# Patient Record
Sex: Male | Born: 1965 | Race: White | Hispanic: No | Smoking: Never smoker
Health system: Southern US, Community
[De-identification: ages and names within clinical notes are randomized; demographics above are authoritative.]

## PROBLEM LIST (undated history)

## (undated) DIAGNOSIS — G4733 Obstructive sleep apnea (adult) (pediatric): Secondary | ICD-10-CM

## (undated) DIAGNOSIS — I240 Acute coronary thrombosis not resulting in myocardial infarction: Secondary | ICD-10-CM

## (undated) DIAGNOSIS — I255 Ischemic cardiomyopathy: Secondary | ICD-10-CM

## (undated) DIAGNOSIS — I509 Heart failure, unspecified: Secondary | ICD-10-CM

## (undated) DIAGNOSIS — F419 Anxiety disorder, unspecified: Secondary | ICD-10-CM

## (undated) DIAGNOSIS — Z9119 Patient's noncompliance with other medical treatment and regimen: Secondary | ICD-10-CM

## (undated) DIAGNOSIS — Z9889 Other specified postprocedural states: Secondary | ICD-10-CM

## (undated) DIAGNOSIS — T8859XA Other complications of anesthesia, initial encounter: Secondary | ICD-10-CM

## (undated) DIAGNOSIS — I2510 Atherosclerotic heart disease of native coronary artery without angina pectoris: Secondary | ICD-10-CM

## (undated) DIAGNOSIS — Z91199 Patient's noncompliance with other medical treatment and regimen due to unspecified reason: Secondary | ICD-10-CM

## (undated) DIAGNOSIS — R519 Headache, unspecified: Secondary | ICD-10-CM

## (undated) DIAGNOSIS — E7800 Pure hypercholesterolemia, unspecified: Secondary | ICD-10-CM

## (undated) DIAGNOSIS — G4730 Sleep apnea, unspecified: Secondary | ICD-10-CM

## (undated) DIAGNOSIS — R112 Nausea with vomiting, unspecified: Secondary | ICD-10-CM

## (undated) DIAGNOSIS — M1990 Unspecified osteoarthritis, unspecified site: Secondary | ICD-10-CM

## (undated) DIAGNOSIS — K760 Fatty (change of) liver, not elsewhere classified: Secondary | ICD-10-CM

## (undated) DIAGNOSIS — I513 Intracardiac thrombosis, not elsewhere classified: Secondary | ICD-10-CM

## (undated) DIAGNOSIS — I10 Essential (primary) hypertension: Secondary | ICD-10-CM

## (undated) DIAGNOSIS — E785 Hyperlipidemia, unspecified: Secondary | ICD-10-CM

## (undated) DIAGNOSIS — R413 Other amnesia: Secondary | ICD-10-CM

## (undated) DIAGNOSIS — N529 Male erectile dysfunction, unspecified: Secondary | ICD-10-CM

## (undated) DIAGNOSIS — R0600 Dyspnea, unspecified: Secondary | ICD-10-CM

## (undated) DIAGNOSIS — F418 Other specified anxiety disorders: Secondary | ICD-10-CM

## (undated) DIAGNOSIS — I219 Acute myocardial infarction, unspecified: Secondary | ICD-10-CM

## (undated) HISTORY — DX: Atherosclerotic heart disease of native coronary artery without angina pectoris: I25.10

## (undated) HISTORY — DX: Other specified anxiety disorders: F41.8

## (undated) HISTORY — DX: Fatty (change of) liver, not elsewhere classified: K76.0

## (undated) HISTORY — DX: Anxiety disorder, unspecified: F41.9

## (undated) HISTORY — DX: Obstructive sleep apnea (adult) (pediatric): G47.33

## (undated) HISTORY — PX: LIVER BIOPSY: SHX301

## (undated) HISTORY — PX: OTHER SURGICAL HISTORY: SHX169

## (undated) HISTORY — DX: Heart failure, unspecified: I50.9

## (undated) HISTORY — DX: Hyperlipidemia, unspecified: E78.5

## (undated) HISTORY — DX: Sleep apnea, unspecified: G47.30

## (undated) HISTORY — PX: CORONARY ANGIOPLASTY: SHX604

## (undated) HISTORY — DX: Pure hypercholesterolemia, unspecified: E78.00

## (undated) HISTORY — PX: NASAL SINUS SURGERY: SHX719

## (undated) HISTORY — PX: CARDIAC CATHETERIZATION: SHX172

---

## 1998-02-26 ENCOUNTER — Ambulatory Visit (HOSPITAL_COMMUNITY): Admission: RE | Admit: 1998-02-26 | Discharge: 1998-02-26 | Payer: Self-pay | Admitting: Family Medicine

## 1998-02-26 ENCOUNTER — Encounter: Payer: Self-pay | Admitting: Family Medicine

## 1998-03-12 ENCOUNTER — Ambulatory Visit (HOSPITAL_COMMUNITY): Admission: RE | Admit: 1998-03-12 | Discharge: 1998-03-12 | Payer: Self-pay | Admitting: Family Medicine

## 1998-03-12 ENCOUNTER — Encounter: Payer: Self-pay | Admitting: Family Medicine

## 1998-03-26 ENCOUNTER — Ambulatory Visit (HOSPITAL_COMMUNITY): Admission: RE | Admit: 1998-03-26 | Discharge: 1998-03-26 | Payer: Self-pay | Admitting: Family Medicine

## 2001-05-20 ENCOUNTER — Emergency Department (HOSPITAL_COMMUNITY): Admission: EM | Admit: 2001-05-20 | Discharge: 2001-05-20 | Payer: Self-pay | Admitting: *Deleted

## 2001-07-13 ENCOUNTER — Ambulatory Visit (HOSPITAL_BASED_OUTPATIENT_CLINIC_OR_DEPARTMENT_OTHER): Admission: RE | Admit: 2001-07-13 | Discharge: 2001-07-13 | Payer: Self-pay | Admitting: *Deleted

## 2003-01-30 ENCOUNTER — Emergency Department (HOSPITAL_COMMUNITY): Admission: AD | Admit: 2003-01-30 | Discharge: 2003-01-30 | Payer: Self-pay | Admitting: Family Medicine

## 2007-01-02 ENCOUNTER — Ambulatory Visit: Payer: Self-pay | Admitting: Cardiology

## 2007-01-05 ENCOUNTER — Encounter: Payer: Self-pay | Admitting: Physician Assistant

## 2008-02-21 ENCOUNTER — Ambulatory Visit: Payer: Self-pay | Admitting: Cardiovascular Disease

## 2008-02-21 ENCOUNTER — Inpatient Hospital Stay (HOSPITAL_COMMUNITY): Admission: EM | Admit: 2008-02-21 | Discharge: 2008-02-25 | Payer: Self-pay | Admitting: Cardiovascular Disease

## 2008-02-22 ENCOUNTER — Encounter: Payer: Self-pay | Admitting: Cardiovascular Disease

## 2008-02-24 ENCOUNTER — Encounter: Payer: Self-pay | Admitting: Cardiology

## 2008-03-01 ENCOUNTER — Encounter: Payer: Self-pay | Admitting: Physician Assistant

## 2008-03-01 ENCOUNTER — Encounter: Payer: Self-pay | Admitting: Cardiology

## 2008-03-02 ENCOUNTER — Ambulatory Visit: Payer: Self-pay | Admitting: Cardiology

## 2008-03-19 ENCOUNTER — Ambulatory Visit: Payer: Self-pay | Admitting: Cardiology

## 2008-04-19 ENCOUNTER — Ambulatory Visit: Payer: Self-pay | Admitting: Cardiology

## 2008-04-23 ENCOUNTER — Ambulatory Visit: Payer: Self-pay | Admitting: Cardiology

## 2008-08-20 DIAGNOSIS — I2109 ST elevation (STEMI) myocardial infarction involving other coronary artery of anterior wall: Secondary | ICD-10-CM | POA: Insufficient documentation

## 2008-09-18 ENCOUNTER — Encounter: Payer: Self-pay | Admitting: Physician Assistant

## 2008-09-19 ENCOUNTER — Encounter (INDEPENDENT_AMBULATORY_CARE_PROVIDER_SITE_OTHER): Payer: Self-pay | Admitting: *Deleted

## 2008-09-27 DIAGNOSIS — E782 Mixed hyperlipidemia: Secondary | ICD-10-CM | POA: Insufficient documentation

## 2008-09-27 DIAGNOSIS — I10 Essential (primary) hypertension: Secondary | ICD-10-CM | POA: Insufficient documentation

## 2008-09-27 DIAGNOSIS — I2510 Atherosclerotic heart disease of native coronary artery without angina pectoris: Secondary | ICD-10-CM | POA: Insufficient documentation

## 2008-11-08 ENCOUNTER — Telehealth (INDEPENDENT_AMBULATORY_CARE_PROVIDER_SITE_OTHER): Payer: Self-pay | Admitting: *Deleted

## 2008-11-08 ENCOUNTER — Ambulatory Visit: Payer: Self-pay | Admitting: Cardiology

## 2008-11-16 ENCOUNTER — Encounter (INDEPENDENT_AMBULATORY_CARE_PROVIDER_SITE_OTHER): Payer: Self-pay | Admitting: *Deleted

## 2008-11-19 ENCOUNTER — Encounter: Payer: Self-pay | Admitting: Cardiology

## 2009-03-06 ENCOUNTER — Encounter: Payer: Self-pay | Admitting: Cardiology

## 2009-03-08 ENCOUNTER — Ambulatory Visit: Payer: Self-pay | Admitting: Cardiology

## 2009-03-26 ENCOUNTER — Encounter: Payer: Self-pay | Admitting: Physician Assistant

## 2009-03-27 ENCOUNTER — Encounter (INDEPENDENT_AMBULATORY_CARE_PROVIDER_SITE_OTHER): Payer: Self-pay | Admitting: *Deleted

## 2009-04-19 ENCOUNTER — Encounter (INDEPENDENT_AMBULATORY_CARE_PROVIDER_SITE_OTHER): Payer: Self-pay | Admitting: *Deleted

## 2009-05-03 ENCOUNTER — Encounter: Payer: Self-pay | Admitting: Cardiology

## 2009-06-07 ENCOUNTER — Encounter: Payer: Self-pay | Admitting: Physician Assistant

## 2010-02-18 NOTE — Medication Information (Signed)
Summary: RX Folder/  PATIENT ASSISTANCE  RX Folder/  PATIENT ASSISTANCE   Imported By: Dorise Hiss 03/14/2009 08:26:25  _____________________________________________________________________  External Attachment:    Type:   Image     Comment:   External Document

## 2010-02-18 NOTE — Medication Information (Signed)
Summary: RX Folder/ APPLICATION PRESCRIPTION ASSISTANCE  RX Folder/ APPLICATION PRESCRIPTION ASSISTANCE   Imported By: Dorise Hiss 06/12/2009 14:12:31  _____________________________________________________________________  External Attachment:    Type:   Image     Comment:   External Document

## 2010-02-18 NOTE — Medication Information (Signed)
Summary: RX Folder/ MAILED APPLICATION PLAVIX  RX Folder/ MAILED APPLICATION PLAVIX   Imported By: Dorise Hiss 04/01/2009 10:14:23  _____________________________________________________________________  External Attachment:    Type:   Image     Comment:   External Document

## 2010-02-18 NOTE — Letter (Signed)
Summary: Generic Engineer, agricultural at [facility] [address]. [address]   [place], [place] [address]   Phone: [phone]  Fax: [phone]        March 27, 2009 MRN: [id number]    Leilan Palo [address], [place]  [address]    Dear Mr. BORGE,   We order lab work to follow up on your cholesterol and liver function levels following your February 18th office visit. It does not appear this lab work has been done yet.  Please take the enclosed order to the [facility] at your earliest convenience to have this done. Do not eat or drink after midnight.  If you primary MD has done this lab work for you or you do not plan to have it done, please contact our office so that we can properly document this in your chart.      Sincerely,  Cyril Loosen, RN, BSN  This letter has been electronically signed by your physician.

## 2010-02-18 NOTE — Assessment & Plan Note (Signed)
Summary: 3 MO FU REMINDER-SRS   Visit Type:  Follow-up Primary Provider:  Dr. Mindi Curling   History of Present Illness: 45 year old male presents for follow-up. Mr. Lambson denies any anginal chest pain. He has NYHA class 2-3 dyspnea on exertion, and states that he has been trying to do some walking at the gym. He is not reporting any nitroglycerin use. He indicates compliance with his medications, although this has been a problem for him over time.  Labs from 19 November 2008 show AST 21, ALT 41, LDL 138, HDL, 32, total cholesterol 244, triglycerides 371. He was not consistently on Simvastatin during this time.  Today we talked about adjustments in medical therapy and a followup lipid profile. We also discussed continuing the strategy of regular exercise and goal for more weight loss.  Preventive Screening-Counseling & Management  Alcohol-Tobacco     Smoking Status: never  Current Medications (verified): 1)  Plavix 75 Mg Tabs (Clopidogrel Bisulfate) .... Take One Tablet By Mouth Daily 2)  Metformin Hcl 500 Mg Tabs (Metformin Hcl) .... Take 1 Tablet By Mouth Two Times A Day 3)  Lantus 100 Unit/ml Soln (Insulin Glargine) .... 25u Subcutaneously At Bedtime 4)  Coreg 12.5 Mg Tabs (Carvedilol) .... Take 1 Tablet By Mouth Two Times A Day (Place On File) 5)  Simvastatin 80 Mg Tabs (Simvastatin) .... Take One Tablet By Mouth Daily At Bedtime 6)  Glipizide 10 Mg Tabs (Glipizide) .... Take 1 Tablet By Mouth Once A Day 7)  Niaspan 1000 Mg Cr-Tabs (Niacin (Antihyperlipidemic)) .... Take 1 Tablet By Mouth Once A Day 8)  Nitroglycerin 0.4 Mg Subl (Nitroglycerin) .... One Tablet Under Tongue Every 5 Minutes As Needed For Chest Pain---May Repeat Times Three 9)  Bupropion Hcl 75 Mg Tabs (Bupropion Hcl) .... Take 1 Tablet By Mouth Once A Day 10)  Mirtazapine 30 Mg Tabs (Mirtazapine) .... Take 1 Tablet By Mouth Once A Day 11)  Trazodone Hcl 150 Mg Tabs (Trazodone Hcl) .... Take 1 Tablet By Mouth Once A Day  At Bedtime  Allergies (verified): No Known Drug Allergies  Comments:  Nurse/Medical Assistant: The patient is currently on medications but does not know the name or dosage at this time. Instructed to contact our office with details. Will update medication list at that time.  Past History:  Past Medical History: Last updated: 03/07/2009 CAD - DES LAD 2/10, LVEF 50% Diabetes Type 2 Hyperlipidemia Hypertension Myocardial Infarction OSA History of elevated liver enzymes - fatty liver  Social History: Last updated: 03/07/2009 Divorced  Tobacco Use - No Alcohol Use - no   Review of Systems  The patient denies anorexia, fever, chest pain, syncope, prolonged cough, headaches, hemoptysis, melena, and hematochezia.         No orthopnea or PND. No palpitations. Otherwise reviewed and negative.  Vital Signs:  Patient profile:   45 year old male Height:      70 inches Weight:      246 pounds Pulse rate:   102 / minute BP sitting:   132 / 89  (left arm) Cuff size:   large  Vitals Entered By: Carlye Grippe (March 08, 2009 1:47 PM)  Physical Exam  Additional Exam:  Obese male in no acute distress. HEENT: Conjunctiva and lids normal, oropharynx clear with moist mucosa. Neck: Supple, no carotid bruits or thyromegaly. Lungs: Clear to auscultation, nonlabored breathing. Diminished overall. Cardiac: Regular rate and rhythm, no S3 gallop, indistinct PMI. Abdomen: Soft, nontender, bowel sounds present. Extremities: No  pitting edema, distal pulses one plus. Skin: Warm and dry. Musculoskeletal: No kyphosis. Neuropsychiatric: Alert and oriented x3, affect appropriate.   EKG  Procedure date:  03/08/2009  Findings:      Normal sinus rhythm at 97 beats per minute with decreased R-wave progression and nonspecific T-wave changes.  Impression & Recommendations:  Problem # 1:  CORONARY ATHEROSCLEROSIS NATIVE CORONARY ARTERY (ICD-414.01)  Symptomatically stable without  progressive angina or breathlessness. Patient reports compliance with his medications, and heart rate remains suboptimally controlled. In light of his cardiovascular disease and mild left ventricular dysfunction, we will continue to uptitrate carvedilol to 12.5 mg p.o. b.i.d. The addition of an ACE inhibitor will be considered next, presuming he is able to stay compliant with his current medications, and guided by blood pressure control. I plan to see him back over the next 4 months, preceded by a 2-D echocardiogram.  His updated medication list for this problem includes:    Plavix 75 Mg Tabs (Clopidogrel bisulfate) .Marland Kitchen... Take one tablet by mouth daily    Coreg 12.5 Mg Tabs (Carvedilol) .Marland Kitchen... Take 1 tablet by mouth two times a day (place on file)    Nitroglycerin 0.4 Mg Subl (Nitroglycerin) ..... One tablet under tongue every 5 minutes as needed for chest pain---may repeat times three  Orders: EKG w/ Interpretation (93000)  Problem # 2:  ESSENTIAL HYPERTENSION, BENIGN (ICD-401.1)  BP mildly increased today. Will need to keep an eye on this, and adjust medications as appropriate.  His updated medication list for this problem includes:    Coreg 12.5 Mg Tabs (Carvedilol) .Marland Kitchen... Take 1 tablet by mouth two times a day (place on file)  Problem # 3:  MIXED HYPERLIPIDEMIA (ICD-272.2)  Will followup fasting lipid profile and liver function tests.  His updated medication list for this problem includes:    Simvastatin 80 Mg Tabs (Simvastatin) .Marland Kitchen... Take one tablet by mouth daily at bedtime    Niaspan 1000 Mg Cr-tabs (Niacin (antihyperlipidemic)) .Marland Kitchen... Take 1 tablet by mouth once a day  Orders: T-Lipid Profile ([id number]) T-Hepatic Function [phone])  Patient Instructions: 1)  Increase Coreg to 12.5mg  two times a day  2)  Labs:  FLP/LFT 3)  2D-Echo in 4 months before next visit 4)  Follow up in  4 months. Prescriptions: COREG 12.5 MG TABS (CARVEDILOL) Take 1 tablet by mouth two times a  day (place on file)  #60 x 6   Entered by:   Hoover Brunette, LPN   Authorized by:   Loreli Slot, MD, [facility]   Signed by:   Hoover Brunette, LPN on 29/56/2130   Method used:   Electronically to        [facility]  [address] (retail)       [address]       [place], [place]  [address]       Ph: [phone]       Fax: [phone]   RxID:   [id number]

## 2010-02-18 NOTE — Letter (Signed)
Summary: [name] [name], agricultural at [facility] [address]   [place], [place] [address]   Phone: [phone]  Fax: [phone]        April 19, 2009 MRN: [id number]    [name] [name] [address], [place]  [address]    Dear [name] [name],  We order lab work to follow up on your cholesterol and liver function levels following your February 18th office visit. We mailed you a letter on March 9th reminding you of these labs. It does not appear this lab work has been done yet.  Please take the enclosed order to the [facility] at your earliest convenience to have this done. Do not eat or drink after midnight.  If you primary MD has done this lab work for you or you do not plan to have it done, please contact our office so that we can properly document this in your chart.        Sincerely,  [name] Loosen, RN, BSN  This letter has been electronically signed by your physician.  Appended Document: [name] Letter Letter will be mailed certified.  Appended Document: [name] Letter Response card was signed on 4/13.

## 2010-03-20 ENCOUNTER — Emergency Department (HOSPITAL_COMMUNITY)
Admission: EM | Admit: 2010-03-20 | Discharge: 2010-03-20 | Disposition: A | Discharge status: Home / Self Care | Payer: Medicare Other | Attending: Emergency Medicine | Admitting: Emergency Medicine

## 2010-03-20 ENCOUNTER — Encounter: Payer: Self-pay | Admitting: Cardiovascular Disease

## 2010-03-20 ENCOUNTER — Emergency Department (HOSPITAL_COMMUNITY): Payer: Medicare Other

## 2010-03-20 DIAGNOSIS — I2510 Atherosclerotic heart disease of native coronary artery without angina pectoris: Secondary | ICD-10-CM | POA: Insufficient documentation

## 2010-03-20 DIAGNOSIS — E119 Type 2 diabetes mellitus without complications: Secondary | ICD-10-CM | POA: Insufficient documentation

## 2010-03-20 DIAGNOSIS — R079 Chest pain, unspecified: Secondary | ICD-10-CM | POA: Insufficient documentation

## 2010-03-20 DIAGNOSIS — I10 Essential (primary) hypertension: Secondary | ICD-10-CM | POA: Insufficient documentation

## 2010-03-20 LAB — COMPREHENSIVE METABOLIC PANEL
ALT: 37 U/L (ref 0–53)
AST: 30 U/L (ref 0–37)
CO2: 27 mEq/L (ref 19–32)
Calcium: 9 mg/dL (ref 8.4–10.5)
Creatinine, Ser: 1 mg/dL (ref 0.4–1.5)
GFR calc Af Amer: >60 mL/min (ref >60)
GFR calc non Af Amer: >60 mL/min (ref >60)
Glucose, Bld: 205 mg/dL — ABNORMAL HIGH (ref 70–99)
Sodium: 138 mEq/L (ref 135–145)
Total Protein: 6.8 g/dL (ref 6.0–8.3)

## 2010-03-20 LAB — TROPONIN I: Troponin I: <0.01 ng/mL (ref 0.00–0.06)

## 2010-03-20 LAB — CBC
HCT: 44.1 % (ref 39.0–52.0)
Hemoglobin: 15.4 g/dL (ref 13.0–17.0)
MCV: 83.7 fL (ref 78.0–100.0)
RBC: 5.27 MIL/uL (ref 4.22–5.81)
WBC: 6.8 10*3/uL (ref 4.0–10.5)

## 2010-03-20 LAB — DIFFERENTIAL
Basophils Absolute: 0 10*3/uL (ref 0.0–0.1)
Lymphocytes Relative: 28 % (ref 12–46)
Lymphs Abs: 1.9 10*3/uL (ref 0.7–4.0)
Neutro Abs: 4.1 10*3/uL (ref 1.7–7.7)
Neutrophils Relative %: 60 % (ref 43–77)

## 2010-03-20 LAB — CK TOTAL AND CKMB (NOT AT ARMC)
Relative Index: 2.2 (ref 0.0–2.5)
Total CK: 231 U/L (ref 7–232)

## 2010-03-20 LAB — POCT CARDIAC MARKERS
CKMB, poc: 2.2 ng/mL (ref 1.0–8.0)
CKMB, poc: 1.6 ng/mL (ref 1.0–8.0)
Myoglobin, poc: 77.1 ng/mL (ref 12–200)
Myoglobin, poc: 64.5 ng/mL (ref 12–200)
Troponin i, poc: <0.05 ng/mL (ref 0.00–0.09)

## 2010-03-20 LAB — RAPID URINE DRUG SCREEN, HOSP PERFORMED: Barbiturates: NOT DETECTED

## 2010-03-20 LAB — GLUCOSE, CAPILLARY: Glucose-Capillary: 205 mg/dL — ABNORMAL HIGH (ref 70–99)

## 2010-03-24 ENCOUNTER — Encounter: Payer: Self-pay | Admitting: Cardiovascular Disease

## 2010-03-25 ENCOUNTER — Telehealth (INDEPENDENT_AMBULATORY_CARE_PROVIDER_SITE_OTHER): Payer: Self-pay | Admitting: *Deleted

## 2010-03-26 ENCOUNTER — Ambulatory Visit (HOSPITAL_COMMUNITY): Payer: Medicare Other | Attending: Cardiovascular Disease

## 2010-03-26 ENCOUNTER — Encounter: Payer: Self-pay | Admitting: Cardiology

## 2010-03-26 ENCOUNTER — Other Ambulatory Visit: Payer: Self-pay | Admitting: Cardiovascular Disease

## 2010-03-26 ENCOUNTER — Encounter: Payer: Self-pay | Admitting: Cardiovascular Disease

## 2010-03-26 ENCOUNTER — Other Ambulatory Visit (INDEPENDENT_AMBULATORY_CARE_PROVIDER_SITE_OTHER): Payer: Medicare Other

## 2010-03-26 DIAGNOSIS — I5031 Acute diastolic (congestive) heart failure: Secondary | ICD-10-CM

## 2010-03-26 DIAGNOSIS — R0609 Other forms of dyspnea: Secondary | ICD-10-CM

## 2010-03-26 DIAGNOSIS — R0789 Other chest pain: Secondary | ICD-10-CM

## 2010-03-26 DIAGNOSIS — R079 Chest pain, unspecified: Secondary | ICD-10-CM | POA: Insufficient documentation

## 2010-03-26 DIAGNOSIS — R0602 Shortness of breath: Secondary | ICD-10-CM

## 2010-03-26 LAB — BASIC METABOLIC PANEL
Calcium: 9.4 mg/dL (ref 8.4–10.5)
GFR: 108.33 mL/min (ref >60.00)
Potassium: 4.2 mEq/L (ref 3.5–5.1)
Sodium: 138 mEq/L (ref 135–145)

## 2010-03-27 ENCOUNTER — Telehealth: Payer: Self-pay | Admitting: Cardiovascular Disease

## 2010-03-31 ENCOUNTER — Emergency Department (HOSPITAL_COMMUNITY)
Admission: EM | Admit: 2010-03-31 | Discharge: 2010-03-31 | Disposition: A | Discharge status: Home / Self Care | Payer: Medicare Other | Attending: Emergency Medicine | Admitting: Emergency Medicine

## 2010-03-31 DIAGNOSIS — L02818 Cutaneous abscess of other sites: Secondary | ICD-10-CM | POA: Insufficient documentation

## 2010-04-01 NOTE — Progress Notes (Signed)
Summary: Nuclear Pre-Procedure  Phone Note Outgoing Call   Call placed by: Stanton Kidney, EMT-P,  March 25, 2010 11:41 AM Summary of Call: Unable to leave message with information on Myoview Information Sheet (see scanned document for details); No accurate phone numbers available. Stanton Kidney, EMT-P  March 25, 2010 11:42 AM      Nuclear Med Background Indications for Stress Test: Evaluation for Ischemia, PTCA Patency, Post Hospital   History: Echo, Heart Catheterization, Myocardial Infarction, Stents   Symptoms: Chest Pain, DOE    Nuclear Pre-Procedure Cardiac Risk Factors: Hypertension, IDDM Type 2, Lipids, Obesity Height (in): 70

## 2010-04-01 NOTE — Progress Notes (Signed)
Summary: pt wants results of labs  Phone Note Call from Patient   Caller: Patient [phone] Reason for Call: Talk to Nurse, Lab or Test Results Summary of Call: pt wants lab results Initial call taken by: Glynda Jaeger,  March 27, 2010 1:12 PM  Follow-up for Phone Call        Told patient I would call him once Dr. Clifton James reviews his stress test. Ellender Hose RN  March 27, 2010 3:22 PM  Follow-up by: Whitney Maeola Sarah RN,  March 27, 2010 3:22 PM  Additional Follow-up for Phone Call Additional follow up Details #1::        Alphonzo [place], This is an [place] patient. I am not sure who ordered the labs and stress test. cdm Additional Follow-up by: Verne Carrow, MD,  March 27, 2010 4:28 PM

## 2010-04-01 NOTE — Assessment & Plan Note (Addendum)
Summary: Cardiology Nuclear testing  Nuclear Med Background Indications for Stress Test: Stent Patency   History: Echo, Heart Catheterization, Myocardial Infarction, Stents  History Comments: 2010-Ant. MI treated with Stent-LAD. EF= 45%. Residual N/O CAD 2010- Echo- EF= 35-40%  Symptoms: Chest Pain, Chest Pain with Exertion, DOE, Fatigue, SOB    Nuclear Pre-Procedure Cardiac Risk Factors: Hypertension, IDDM Type 2, Lipids Caffeine/Decaff Intake: None NPO After: 7:00 PM Lungs: clear IV 0.9% NS with Angio Cath: 18g     IV Site: R Antecubital IV Started by: Stanton Kidney, EMT-P Chest Size (in) 46     Height (in): 70 Weight (lb): 237 BMI: 34.13  Nuclear Med Study 1 or 2 day study:  1 day     Stress Test Type:  Treadmill/Lexiscan Reading MD:  Willa Rough, MD     Referring MD:  C.McAlhany Resting Radionuclide:  Technetium 50m Tetrofosmin     Resting Radionuclide Dose:  10.5 mCi  Stress Radionuclide:  Technetium 18m Tetrofosmin     Stress Radionuclide Dose:  33.0 mCi   Stress Protocol Exercise Time (min):  11:11 min     Max HR:  139 bpm     Predicted Max HR:  176 bpm  Max Systolic BP: 159 mm Hg     Percent Max HR:  78.98 %     METS: 11.0 Rate Pressure Product:  91478  Lexiscan: 0.4 mg   Stress Test Technologist:  Frederick Peers, EMT-P     Nuclear Technologist:  Domenic Polite, CNMT  Rest Procedure  Myocardial perfusion imaging was performed at rest 45 minutes following the intravenous administration of Technetium 61m Tetrofosmin.  Stress Procedure  The patient received IV Lexiscan 0.4 mg over 15-seconds with concurrent low level exercise and then Technetium 30m Tetrofosmin was injected at 30-seconds while the patient continued walking one more minute.  There were no significant changes with Lexiscan.  Quantitative spect images were obtained after a 45 minute delay.  QPS Raw Data Images:  Patient motion noted; appropriate software correction applied. Stress Images:  Severe  decrease in activity in the anterior wall, anterior septum and the apex Rest Images:  Similar to stress Subtraction (SDS):  Minimal ischemia Transient Ischemic Dilatation:  1.01  (Normal <1.22)  Lung/Heart Ratio:  0.35  (Normal <0.45)  Quantitative Gated Spect Images QGS EDV:  191 ml QGS ESV:  128 ml QGS EF:  33 % QGS cine images:  Akinesis of the septum, anterior wall and the apex  Findings Abnormal      Overall Impression  Exercise Capacity: Good exercise capacity. BP Response: Normal blood pressure response. Clinical Symptoms: SOB ECG Impression: No significant ST segment change suggestive of ischemia. Overall Impression Comments: With stress on treadmill the patient was not reaching desired heart rate, so Lexiscan given. The nuclear images reveal old large scar affecting the anterior wall, anterior septum, and the apex. There is mild peri-infarct ischemia.  Appended Document: Cardiology Nuclear testing This is a patient of Dr. Ival Bible. He has not been seen in the [place] office. I am not sure who ordered the stress test. No visits in [place] office since February 2011. I will forward to Dr. Diona Browner for review.   CDM  Appended Document: Cardiology Nuclear testing I have not seen Mr. Burnett since February 2011 in the [place] office. As noted with the recent lab work, I am not certain who ordered these studies. We will arrange an office visit to discuss them.  Appended Document: Cardiology Nuclear testing Pt was seen in  [place] d/t chest pain on March 1st. He states tests were ordered following this test. Pt states he has moved to [facility] and wishes to follow up there in the future. Pt has pending appt w/Dr. Clifton James on 3/22 according to Epic.

## 2010-04-09 ENCOUNTER — Encounter: Payer: Self-pay | Admitting: Physician Assistant

## 2010-04-10 ENCOUNTER — Encounter: Payer: Self-pay | Admitting: Cardiovascular Disease

## 2010-04-16 ENCOUNTER — Encounter: Payer: Self-pay | Admitting: Physician Assistant

## 2010-04-17 NOTE — Consult Note (Signed)
[name] [name], [name] [name]                  ACCOUNT NO.:  [id number]  MEDICAL RECORD NO.:  [id number]           PATIENT TYPE:  E  LOCATION:  [facility]                         FACILITY:  [facility]  PHYSICIAN:  Bevelyn Buckles. Terin Cragle, MDDATE OF BIRTH:  1965/04/05  DATE OF CONSULTATION:  03/20/2100 DATE OF DISCHARGE:  03/20/2010                                CONSULTATION   PRIMARY CARDIOLOGIST:  Previously Dr. Diona Browner in [place].  The patient will follow up with Dr. Clifton James in [place].  Primary care previously [facility].  PATIENT PROFILE:  A 45 year old male with prior history of anterior MI, status post drug-eluting stent to the LAD in 2010 who presents with sharp shooting chest pain.  PROBLEMS: 1. Chest pain/coronary artery disease.     a.     February 21, 2008, anterior ST segment elevation MI with      catheterization revealing a totally occluded proximal LAD and      otherwise nonobstructive disease.  The LAD was stented with a 3.0      x 50 mm Endeavor drug-eluting stent. 2. Ischemic cardiomyopathy.     a.     February 22, 2008, 2D echocardiogram, EF 35-40% was septal      and apical as well as distal anterior akinesis. 3. Type 2 diabetes mellitus now diet controlled. 4. Hyperlipidemia. 5. Noncompliance. 6. Insomnia. 7. Sleep apnea. 8. Hypertension. 9. History of fatty liver with a history of elevated LFTs.  ALLERGIES:  No known drug allergies.  HISTORY OF PRESENT ILLNESS:  A 45 year old male with prior history of CAD status post anterior MI in February 2010 with drug-eluting stent placement to the proximal LAD.  Since his MI, the patient has had occasional chest pain and dyspnea on exertion, but only with higher levels of activity.  This has been stable over the past 2 years.  He has been walking 45-60 minutes daily without symptoms or limitations.  He was last seen by Dr. Diona Browner in [place] in February 2011.  At that time, the patient's Coreg was titrated with  plan for followup echo and subsequent addition of ACE inhibitor given history of ischemic cardiomyopathy.  Unfortunately, the patient has never followed up.  More so, the patient came off of all his medications in the past 6-12 months, as he lost contact with doctors at [facility] and subsequently, moved to [place] about a month ago.  Last night about 8:00 p.m., the patient developed intermittent sharp and shooting chest pain lasting a second or so and resolving spontaneously. Symptoms recurred few times in hour.  There was no dyspnea.  Symptoms were dissimilar to prior angina, which was more typical in nature with an elephant on his chest and dyspnea.  After going to bed, the patient continued to have intermittent sharp shooting chest pain especially when he rolled from one side to the other or took a deep breath.  At about 5:00 a.m., he noted some mild nausea and because of his constellation of symptoms, decided to come in to the ED where his nausea was promptly relieved with Zofran.  He has  had no recurrence in chest pain and ECG shows no acute changes.  His enzymes are negative.  The patient wishes to be discharged from the ED.  HOME MEDICATIONS:  The patient is currently not on any medications, but as of February 2011, the patient was supposed to be on: 1. Plavix 75 mg daily. 2. Metformin 500 mg b.i.d. 3. Lantus 25 units at bedtime. 4. Coreg 12.5 mg b.i.d. 5. Simvastatin 80 mg at bedtime. 6. Glipizide 10 mg daily. 7. Niaspan 1000 mg daily. 8. Nitroglycerin p.r.n. 9. Bupropion 75 mg daily. 10.Remeron 30 mg daily. 11.Trazodone 150 mg at bedtime.  FAMILY HISTORY:  Mother has a history of cancer.  Father has a history of coronary artery disease, CABG, and COPD.  SOCIAL HISTORY:  The patient lives in [place] with his wife.  He just moved here about a month ago for [place].  He does not work and is on disability.  Denies tobacco, alcohol, or drug  use.  He walks 45-60 minutes daily.  REVIEW OF SYSTEMS:  Positive for dyspnea with high levels of exertion as well as mild chest pain with a similar levels of exertion.  He did have a sharp shooting chest pain different from his prior angina overnight. Otherwise, all systems reviewed negative.  He is full code.  PHYSICAL EXAMINATION:  VITAL SIGNS: Temperature 98.5, heart rate 86, respirations 16, blood pressure 116/77, pulse ox 96% on room air. GENERAL:  Pleasant white male, in no acute distress, awake, alert, and oriented x3.  He has a normal affect. HEENT:  Normal.  Nares grossly intact, nonfocal. SKIN:  Warm, dry without lesions or masses. NECK:  Supple without bruits or JVD. LUNGS:  Respirations are unlabored.  Clear to auscultation. CARDIAC:  Regular S1 and S2.  No S3, S4, murmurs. ABDOMEN:  Round, soft, nontender, nondistended.  Bowel sounds present x4. EXTREMITIES:  Warm, dry, and pink.  No clubbing, cyanosis, or edema. Dorsalis pedis, posterior tibial pulses 2+ and equal bilaterally.  Chest x-ray shows no acute disease.  EKG shows sinus rhythm, rate 86, normal axis, prior anterior infarct.  Hemoglobin 15.4, hematocrit 44.1, WBC 6.8, platelets 246.  Sodium 138, potassium 4.6, chloride 106, CO2 of 27, BUN 10, creatinine 1.0, glucose 205, total bilirubin 0.5, alkaline phosphatase 56, AST 30, ALT 37, total protein 6.8, albumin 3.8, CK 231, MB 501, troponin I less than 0.01, calcium 9.0.  ASSESSMENT AND PLAN: 1. Chest pain without objective evidence of ischemia/coronary artery     disease.  The patient has prior history of coronary artery disease     status post anterior myocardial infarction in 2010 with drug-     eluting stent placement to the proximal LAD.  He has been off of     his Plavix for at least 6 months.  Given that he is 2 years post     drug-eluting stent, we will not reinitiate Plavix at this time.     His chest pain currently is fairly atypical without  objective     evidence of ischemia.  We have arranged for the patient to have an     outpatient exercise Myoview in our office on March 26, 2010, at 8:00     a.m.  Follow up with Dr. Clifton James who performed his stent     procedure, on April 10, 2010, at 2:15 p.m.  We have reiterated to     the patient the importance of medication compliance especially in     light of prior  history of coronary artery disease, myocardial     infarction, and ischemic cardiomyopathy.  We have provided him with     prescriptions for Coreg 3.125 mg b.i.d., lisinopril 5 mg daily,     lovastatin 4 mg at bedtime, nitroglycerin 0.4 mg sublingual on     p.r.n. chest pain, and aspirin 81 mg daily.  As we are reinitiating     statin therapy, the patient will require follow up with LFTs in     approximately 6-8 weeks.  He will also need a followup basic     metabolic panel, which he can have when he comes back for a stress     test on March 26, 2010. 2. Hypertension.  Reinitiating beta blocker and ACE inhibitor.  We     will follow as an outpatient. 3. Hyperlipidemia.  Resume statin therapy as above.  Follow up lipids     and LFTs in 6-8 weeks. 4. History of noncompliance, stressed the importance of compliance for     secondary prevention.     Nicolasa Ducking, ANP   ______________________________ Bevelyn Buckles. Keva Darty, MD    CB/MEDQ  D:  03/20/2010  T:  03/21/2010  Job:  [id number]  Electronically Signed by Nicolasa Ducking ANP on 03/25/2010 04:20:54 PM Electronically Signed by Arvilla Meres MD on 04/17/2010 06:44:22 PM

## 2010-04-24 ENCOUNTER — Ambulatory Visit (INDEPENDENT_AMBULATORY_CARE_PROVIDER_SITE_OTHER): Payer: Medicare Other | Admitting: Cardiovascular Disease

## 2010-04-24 ENCOUNTER — Encounter: Payer: Self-pay | Admitting: Cardiovascular Disease

## 2010-04-24 VITALS — BP 128/88 | HR 92 | Ht 70.0 in | Wt 242.0 lb

## 2010-04-24 DIAGNOSIS — I2589 Other forms of chronic ischemic heart disease: Secondary | ICD-10-CM

## 2010-04-24 DIAGNOSIS — I255 Ischemic cardiomyopathy: Secondary | ICD-10-CM

## 2010-04-24 DIAGNOSIS — I2510 Atherosclerotic heart disease of native coronary artery without angina pectoris: Secondary | ICD-10-CM

## 2010-04-24 MED ORDER — ASPIRIN EC 81 MG PO TBEC: 81.0000 mg | DELAYED_RELEASE_TABLET | Freq: Every day | ORAL | Status: DC

## 2010-04-24 MED ORDER — SIMVASTATIN 40 MG PO TABS: 40.0000 mg | ORAL_TABLET | Freq: Every day | ORAL | Status: DC

## 2010-04-24 MED ORDER — LISINOPRIL 5 MG PO TABS: 5.0000 mg | ORAL_TABLET | Freq: Every day | ORAL | Status: DC

## 2010-04-24 MED ORDER — CARVEDILOL 6.25 MG PO TABS: 6.2500 mg | ORAL_TABLET | Freq: Two times a day (BID) | ORAL | Status: DC

## 2010-04-24 NOTE — Patient Instructions (Signed)
Your physician recommends that you schedule a follow-up appointment in: 3 months with Dr. Clifton James. Your physician has recommended you make the following change in your medication: START SIMVASTATIN 40mg  daily, LISINOPRIL 5 mg daily, ASPIRIN 81mg  daily, and COREG 6.25mg  twice daily.\ Your physician has requested that you have an echocardiogram. Echocardiography is a painless test that uses sound waves to create images of your heart. It provides your doctor with information about the size and shape of your heart and how well your heart's chambers and valves are working. This procedure takes approximately one hour. There are no restrictions for this procedure. To be done in 3 months

## 2010-04-24 NOTE — Assessment & Plan Note (Addendum)
S/p anterior STEMI February 2010 with occluded LAD, s/p DES. He has stopped all of his cardiac meds. I will restart Coreg 6.25 mg po BID, ASA 81 mg po Qdaily, Lisinopril 5 mg po QDaily, Simvastin  40 mg po QHS. He is encouraged to see a primary care doctor for his diabetic management. Stress test with mild peri-infarct ischemia. He is having no chest pain. Will get him back on his meds, repeat echo in 3 months and see him back. He may need consideration for an ICD.

## 2010-04-24 NOTE — Progress Notes (Signed)
History of Present Illness:44 yo WM with history of CAD with anterior STEMI February 2010 with drug eluting stent placed in the proximal LAD, DM, hyperlipidemia here today for cardiac follow up. He has been followed over the last two years in our [place] office by Dr. Diona Browner but wished to be seen in [place].His last visit in the [place] office was in February 2011, he missed his 4 month follow up and his follow up echo.    He was admitted to [facility] March 20, 2010 with chest pain. He ruled out for an MI with serial cardiac enzymes. Outpatient myoview was arranged. He could not get his heart rate up with exercise so Lexiscan was used. This showed scar in the anterior wall with mild ischemia and EF of 33%. His post STEMI  EF leaving the hospital was 35% by echo in February 2010. He has not been taking any of his medications. He has stopped all of the cardiac meds and diabetic meds. He has not seen a primary care physician. He rarely checks his blood sugars but last check several months ago his blood sugar was over 200. He has not been following a good diet. He did rake leaves last week and had no chest pain or dyspnea. The chest pain that he had before his hospital admission last month was not similar to the pain he had before his MI.     Past Medical History  Diagnosis Date  . Coronary artery disease     DES LAD 2/10, LVEF 50%  . Diabetes mellitus     TYPE II  . Hyperlipidemia   . Heart attack   . OSA (obstructive sleep apnea)   . Fatty liver     H/O ELEVATED LIVER ENZYMES    No past surgical history on file.  Current Outpatient Prescriptions  Medication Sig Dispense Refill  . buPROPion (WELLBUTRIN) 75 MG tablet Take 75 mg by mouth daily. 1 TABLET ONCE DAILY       . carvedilol (COREG) 12.5 MG tablet Take 12.5 mg by mouth 2 (two) times daily with a meal.        . clopidogrel (PLAVIX) 75 MG tablet Take 75 mg by mouth daily.        Marland Kitchen glipiZIDE (GLUCOTROL) 10 MG tablet Take 10 mg by  mouth daily. 1 TABLET ONCE DAILY       . insulin glargine (LANTUS) 100 UNIT/ML injection Inject 25 Units into the skin at bedtime.        . metFORMIN (GLUCOPHAGE) 500 MG tablet Take 500 mg by mouth 2 (two) times daily with a meal.        . mirtazapine (REMERON) 30 MG tablet Take 30 mg by mouth at bedtime.        . niacin (NIASPAN) 1000 MG CR tablet Take 1,000 mg by mouth at bedtime.        . nitroGLYCERIN (NITROSTAT) 0.4 MG SL tablet Place 0.4 mg under the tongue every 5 (five) minutes as needed.        . simvastatin (ZOCOR) 80 MG tablet Take 80 mg by mouth at bedtime.        . traZODone (DESYREL) 150 MG tablet Take 150 mg by mouth at bedtime.          Not on File  History   Social History  . Marital Status: Married    Spouse Name: N/A    Number of Children: N/A  . Years of Education: N/A  Occupational History  . Not on file.   Social History Main Topics  . Smoking status: Never Smoker   . Smokeless tobacco: Not on file  . Alcohol Use: No  . Drug Use: Not on file  . Sexually Active: Not on file   Other Topics Concern  . Not on file   Social History Narrative  . No narrative on file    Family History  Problem Relation Age of Onset  . Coronary artery disease      UNKNOWN    Review of Systems:  No chest pain, SOB, palpitations, dizziness,  near syncope or syncope.  No PND, orthopnea, or Lower extremity edema.   BP 128/88  Pulse 92  Ht 5\' 10"  (1.778 m)  Wt 242 lb (109.77 kg)  BMI 34.72 kg/m2  Physical Examination: General: Well developed, well nourished, NAD HEENT: OP clear, mucus membranes moist SKIN: warm, dry. No rashes. Neuro: No focal deficits Musculoskeletal: Muscle strength 5/5 all ext Psychiatric: Mood and affect normal Neck: No JVD, no carotid bruits, no thyromegaly, no lymphadenopathy. Lungs:Clear bilaterally, no wheezes, rhonci, crackles Cardiovascular: Regular rate and rhythm. No murmurs, gallops or rubs. Abdomen:Soft. Bowel sounds present.  Non-tender.  Extremities: No lower extremity edema. Pulses are 2 + in the bilateral DP/PT.  Lexiscan Stress Myoview (03/26/10):  Raw Data Images:  Patient motion noted; appropriate software correction applied. Stress Images:  Severe decrease in activity in the anterior wall, anterior septum and the apex Rest Images:  Similar to stress Subtraction (SDS):  Minimal ischemia Transient Ischemic Dilatation:  1.01  (Normal <1.22)  Lung/Heart Ratio:  0.35  (Normal <0.45)  Quantitative Gated Spect Images  QGS EDV:  191 ml QGS ESV:  128 ml QGS EF:  33 % QGS cine images:  Akinesis of the septum, anterior wall and the apex  Findings  Abnormal  Overall Impression   Exercise Capacity: Good exercise capacity. BP Response: Normal blood pressure response. Clinical Symptoms: SOB ECG Impression: No significant ST segment change suggestive of ischemia. Overall Impression Comments: With stress on treadmill the patient was not reaching desired heart rate, so Lexiscan given. The nuclear images reveal old large scar affecting the anterior wall, anterior septum, and the apex. There is mild peri-infarct ischemia.  EKG: NSR, rate 92 bpm. Prior septal infarct.

## 2010-04-24 NOTE — Assessment & Plan Note (Signed)
See avove. Resume cardiac meds. Repeat echo 3 months. Likely ICD in future.

## 2010-05-06 LAB — CARDIAC PANEL(CRET KIN+CKTOT+MB+TROPI)
CK, MB: 13.9 ng/mL — ABNORMAL HIGH (ref 0.3–4.0)
CK, MB: 136.5 ng/mL — ABNORMAL HIGH (ref 0.3–4.0)
CK, MB: 32.3 ng/mL — ABNORMAL HIGH (ref 0.3–4.0)
Relative Index: 2.7 — ABNORMAL HIGH (ref 0.0–2.5)
Relative Index: 4.1 — ABNORMAL HIGH (ref 0.0–2.5)
Relative Index: 8.9 — ABNORMAL HIGH (ref 0.0–2.5)
Total CK: 1538 U/L — ABNORMAL HIGH (ref 7–232)
Total CK: 204 U/L (ref 7–232)
Total CK: 2911 U/L — ABNORMAL HIGH (ref 7–232)
Total CK: 320 U/L — ABNORMAL HIGH (ref 7–232)
Total CK: 4038 U/L — ABNORMAL HIGH (ref 7–232)
Total CK: 547 U/L — ABNORMAL HIGH (ref 7–232)
Troponin I: 33.82 ng/mL (ref 0.00–0.06)
Troponin I: >100 ng/mL (ref 0.00–0.06)

## 2010-05-06 LAB — CBC
HCT: 43.8 % (ref 39.0–52.0)
HCT: 44.5 % (ref 39.0–52.0)
Hemoglobin: 14.3 g/dL (ref 13.0–17.0)
Hemoglobin: 14.7 g/dL (ref 13.0–17.0)
Hemoglobin: 14.7 g/dL (ref 13.0–17.0)
MCHC: 33.6 g/dL (ref 30.0–36.0)
MCHC: 34.9 g/dL (ref 30.0–36.0)
MCHC: 35.6 g/dL (ref 30.0–36.0)
MCV: 86.5 fL (ref 78.0–100.0)
MCV: 86.6 fL (ref 78.0–100.0)
Platelets: 301 10*3/uL (ref 150–400)
RBC: 4.72 MIL/uL (ref 4.22–5.81)
RBC: 5.07 MIL/uL (ref 4.22–5.81)
RDW: 12.5 % (ref 11.5–15.5)
RDW: 12.5 % (ref 11.5–15.5)

## 2010-05-06 LAB — COMPREHENSIVE METABOLIC PANEL
ALT: 113 U/L — ABNORMAL HIGH (ref 0–53)
ALT: 72 U/L — ABNORMAL HIGH (ref 0–53)
Alkaline Phosphatase: 58 U/L (ref 39–117)
BUN: 11 mg/dL (ref 6–23)
CO2: 24 mEq/L (ref 19–32)
Calcium: 8.5 mg/dL (ref 8.4–10.5)
GFR calc non Af Amer: >60 mL/min (ref >60)
Glucose, Bld: 217 mg/dL — ABNORMAL HIGH (ref 70–99)
Glucose, Bld: 247 mg/dL — ABNORMAL HIGH (ref 70–99)
Potassium: 4.3 mEq/L (ref 3.5–5.1)
Sodium: 134 mEq/L — ABNORMAL LOW (ref 135–145)
Sodium: 135 mEq/L (ref 135–145)
Total Protein: 6.2 g/dL (ref 6.0–8.3)

## 2010-05-06 LAB — GLUCOSE, CAPILLARY
Glucose-Capillary: 131 mg/dL — ABNORMAL HIGH (ref 70–99)
Glucose-Capillary: 156 mg/dL — ABNORMAL HIGH (ref 70–99)
Glucose-Capillary: 197 mg/dL — ABNORMAL HIGH (ref 70–99)
Glucose-Capillary: 210 mg/dL — ABNORMAL HIGH (ref 70–99)
Glucose-Capillary: 231 mg/dL — ABNORMAL HIGH (ref 70–99)
Glucose-Capillary: 235 mg/dL — ABNORMAL HIGH (ref 70–99)
Glucose-Capillary: 256 mg/dL — ABNORMAL HIGH (ref 70–99)
Glucose-Capillary: 266 mg/dL — ABNORMAL HIGH (ref 70–99)
Glucose-Capillary: 268 mg/dL — ABNORMAL HIGH (ref 70–99)

## 2010-05-06 LAB — BASIC METABOLIC PANEL
BUN: 11 mg/dL (ref 6–23)
CO2: 26 mEq/L (ref 19–32)
Calcium: 8.6 mg/dL (ref 8.4–10.5)
Calcium: 9.1 mg/dL (ref 8.4–10.5)
Creatinine, Ser: 0.88 mg/dL (ref 0.4–1.5)
GFR calc Af Amer: >60 mL/min (ref >60)
GFR calc Af Amer: >60 mL/min (ref >60)
GFR calc Af Amer: >60 mL/min (ref >60)
GFR calc non Af Amer: >60 mL/min (ref >60)
Potassium: 3.3 mEq/L — ABNORMAL LOW (ref 3.5–5.1)
Potassium: 4.4 mEq/L (ref 3.5–5.1)
Sodium: 133 mEq/L — ABNORMAL LOW (ref 135–145)
Sodium: 135 mEq/L (ref 135–145)
Sodium: 136 mEq/L (ref 135–145)

## 2010-05-06 LAB — POCT I-STAT, CHEM 8
BUN: 13 mg/dL (ref 6–23)
Calcium, Ion: 1.17 mmol/L (ref 1.12–1.32)
Chloride: 97 mEq/L (ref 96–112)
HCT: 44 % (ref 39.0–52.0)
Potassium: 3.2 mEq/L — ABNORMAL LOW (ref 3.5–5.1)
Sodium: 132 mEq/L — ABNORMAL LOW (ref 135–145)

## 2010-05-06 LAB — HEPATITIS PANEL, ACUTE: Hep A IgM: NEGATIVE

## 2010-05-06 LAB — LIPID PANEL: Cholesterol: 213 mg/dL — ABNORMAL HIGH (ref 0–200)

## 2010-05-06 LAB — HEMOGLOBIN A1C: Hgb A1c MFr Bld: 10.5 % — ABNORMAL HIGH (ref 4.6–6.1)

## 2010-06-03 NOTE — Discharge Summary (Signed)
[name] [name], [name] [name]                  ACCOUNT NO.:  [id number]   MEDICAL RECORD NO.:  [id number]          PATIENT TYPE:  INP   LOCATION:  2039                         FACILITY:  [facility]   PHYSICIAN:  Madolyn Frieze. Jens Som, MD, FACCDATE OF BIRTH:  01-Oct-1965   DATE OF ADMISSION:  02/21/2008  DATE OF DISCHARGE:  02/25/2008                               DISCHARGE SUMMARY   PRINCIPAL DIAGNOSES:  1. Acute anterior ST segment elevation myocardial infarction.      a.     Emergent drug-eluting stenting of 100% occluded proximal       left anterior descending artery.      b.     Preserved left ventricular function.      c.     Residual, nonobstructive coronary artery disease.   SECONDARY DIAGNOSES:  1. Type 2 diabetes mellitus.  2. Dyslipidemia.  3. Mildly elevated liver enzymes   REASON FOR ADMISSION:  [name] [name] is a 45 year old male, with no prior  history of heart disease, who was transferred via [facility]  directly to the cardiac catheterization lab, following presentation with  severe chest pain and EKG changes consistent with anterior ST elevation  myocardial infarction.   Dr. Verne Carrow proceeded with coronary angiography, which  revealed severe, single-vessel CAD with 100% occlusion of the proximal  LAD.  This was successfully treated with placement of an Endeavor drug-  eluting stent, with no noted complications.   Of note, the patient presented only on oral hypoglycemics.  He was  subsequently placed on a diabetic regimen which included Lantus.   Postop 2-D echocardiogram suggested some mild LV dysfunction (EF 35-40%)  with a septal, apical, and distal anterior wall akinesis.  This compared  to the estimated ejection fraction of 45-50% by coronary angiography.   The patient had an otherwise benign postop course and was cleared for  discharge on hospital day #4.  Arrangements were made for him to be  provided with assistance of 4 Plavix, including a 14-day supply  from  [facility].   DISPOSITION AT THE TIME OF DISCHARGE:  Stable.   MEDICATIONS AT DISCHARGE:  1. Plavix 150 mg daily (x1 full week), then 75 mg daily.  2. Coated aspirin 325 mg daily.  3. Simvastatin 80 mg q.h.s.  4. Glucotrol XL 10 mg daily.  5. Metformin 500 mg b.i.d.  6. Lantus 15 units q.h.s.  7. Lopressor 100 mg b.i.d.  8. Lisinopril 5 mg daily.  9. Nitrostat 0.4 mg as needed.   INSTRUCTIONS:  1. The patient is to arrange a followup with Dr. Prescott Parma in      [place], with whom he previously followed, with particular emphasis on      diabetes management.  2. Arrangements will be made through our [facility] for the patient      to establish with [place] in the ensuing 2 weeks.   DISCHARGE LABS:  Glucose of 131 .  Peak cardiac markers notable for  total CPK of 500 and a troponin of 25, on admission.  A fasting lipid  profile was not  drawn.  Discharge duration greater than 30 minutes.  Notable findings include mildly elevated AST of 84 and ALT of 72.      Gene Serpe, PA-C      Madolyn Frieze. Jens Som, MD, [facility]  Electronically Signed    GS/MEDQ  D:  02/25/2008  T:  02/26/2008  Job:  [id number]

## 2010-06-03 NOTE — H&P (Signed)
[name] [name], [name] [name] NO.:  [id number]   MEDICAL RECORD NO.:  [id number]          PATIENT TYPE:  INP   LOCATION:  2915                         FACILITY:  [facility]   PHYSICIAN:  Verne Carrow, MDDATE OF BIRTH:  09-19-1965   DATE OF ADMISSION:  02/21/2008  DATE OF DISCHARGE:                              HISTORY & PHYSICAL   PRIMARY CARDIOLOGIST:  Ellis Parents Verne Carrow, MD   PRIMARY CARE PHYSICIAN:  [facility].   HISTORY OF PRESENT ILLNESS:  A 45 year old obese Caucasian male with  sudden onset of chest pain after shoveling snow this afternoon.  The  patient finished shoveling snow and walked over to a nearby tire store  and began to have severe chest pain with associated diaphoresis.  EMS  was called and the patient's EKG revealed anterior ST elevation.  The  patient was diaphoretic and complaining of severe chest pain, it was  also reproducible with palpation and movement of the left arm.  The  patient was given nitroglycerin and 4 baby aspirin and transported to  [facility] emergently as an ST-elevated MI.  The patient is  currently anxious and having 10/10 pain.   REVIEW OF SYSTEMS:  Positive for chest pain, shortness of breath,  dyspnea on exertion, nausea, and vomiting, and left arm pain.   PAST MEDICAL HISTORY:  1. Diabetes, on p.o. meds.  2. Hypercholesterolemia, on no meds.   SOCIAL HISTORY:  He lives in [place] alone.  He is unemployed.  He is  divorced.  He does not smoke, and does not drink alcohol.   FAMILY HISTORY:  Mother with cancer.  Father with CAD and coronary  artery bypass grafting along with COPD.   CURRENT MEDICATIONS AT HOME:  Glipizide.   ALLERGIES:  No known drug allergies.   Current labs are pending.  EKG revealing ST elevation anteriorly,  ventricular rate of 100 beats per minute.   PHYSICAL EXAMINATION:  VITAL SIGNS:  Blood pressure 175/91, pulse 110,  respirations  30, and O2 sat 100% on a venti mask.  GENERAL:  He is awake, alert, anxious, nauseated, short of breath, and  in extreme pain, crying out.  HEENT:  Head is normocephalic and atraumatic.  Eyes, PERRLA.  Mucous  membranes of mouth pink and moist.  Tongue is midline.  NECK:  Supple.  No JVD.  No carotid bruits appreciated.  CARDIOVASCULAR:  Regular rate and rhythm, tachycardic without murmurs,  rubs, or gallops.  LUNGS:  Clear to auscultation anterolaterally without wheezes, rales, or  rhonchi.  ABDOMEN:  Obese and nontender.  Bowel sounds 2+.  EXTREMITIES:  Without clubbing, cyanosis, or edema.  NEUROLOGIC:  Cranial nerves II through XII are grossly intact.   IMPRESSION:  1. Acute anterior ST-elevated myocardial infarction.  2. Known history of diabetes.  3. History of hypercholesterolemia.   PLAN:  This is a 45 year old obese Caucasian male who is admitted  emergently to the Cardiac Catheterization Lab with ST-elevated MI.  Cardiac catheterization reveals 100% LAD occlusion.  The patient will  undergo PCI per Dr. Cristal Deer  McAlhany as he is in attendance of  present.  The patient will be placed in ICU postprocedure with more  recommendations specified in the hospital course and the patient  responds to treatment in the interim, the patient will be placed on  Plavix, aspirin, Lipitor, Lopressor, lisinopril and we will monitor  closely.      Bettey Mare. Lyman Bishop, NP      Verne Carrow, MD  Electronically Signed    KML/MEDQ  D:  02/21/2008  T:  02/22/2008  Job:  [id number]

## 2010-06-03 NOTE — Assessment & Plan Note (Signed)
[facility]                          [facility] NOTE   NAME:[name], [name] [name]                           MRN:          [id number]  DATE:03/19/2008                            DOB:          [birth year]    PRIMARY CARE PHYSICIAN:  Colon Branch, MD   REASON FOR VISIT:  Post-hospital followup.   HISTORY OF PRESENT ILLNESS:  [name] is a 45 year old male with a  history of anterior wall myocardial infarction earlier this year,  managed at [facility] with placement of a drug-eluting stent  addressing an occluded proximal left anterior descending.  His ejection  fraction was in the range of 35-40% based on postprocedure  echocardiography.  He otherwise had residual nonobstructive coronary  artery disease.  Additional risk factors include hyperlipidemia, type 2  diabetes mellitus, and obstructive sleep apnea with obesity.  He is  unemployed and denies any tobacco use.  He was seen at [facility] following this original presentation with recurrent atypical  chest pain in the setting of a probable right lung pneumonia which was  treated with antibiotics.  He states that he continues to have some  intermittent symptoms, although nothing like his presenting angina.  He  reports having difficulty obtaining all of his medications due to cost,  particularly that of Plavix.  Today, we talked about the Plavix  assistance program.  He did not bring his medicines today and was not  aware of their doses.  We placed a call to his pharmacy to obtain the  medicine list and there are duplications which need to be explored  further.  He has had no palpitations or syncope.   ALLERGIES:  No known drug allergies.   PRESENT MEDICATIONS:  1. Plavix 75 mg p.o. daily.  2. Metformin 500 mg p.o. b.i.d.  3. Lantus insulin.  4. Lisinopril 5 mg p.o. daily.  5. Lasix 20 mg p.o. daily.  6. Carvedilol 3.125 mg p.o. b.i.d.  7. Simvastatin 80 mg p.o.  daily.  8. Lopressor 100 mg p.o. b.i.d.  9. Glipizide ER 10 mg p.o. daily.  10.Sublingual nitroglycerin before meals p.r.n.   REVIEW OF SYSTEMS:  As outlined above.  Otherwise negative.   PHYSICAL EXAMINATION:  VITAL SIGNS:  Blood pressure today is 128/81,  heart rate is 97, and weight is 243 pounds.  GENERAL:  This is an obese male in no acute distress.  No active chest  pain.  HEENT:  Conjunctivae are normal.  Oropharynx is clear.  Poor dentition.  NECK:  Supple.  No elevated jugular venous pressure.  No loud bruits or  thyromegaly.  LUNGS:  Clear with diminished breath sounds.  No wheezing noted on  unlabored breathing.  CARDIAC:  Regular rate and rhythm.  No S3 gallop or pericardial rub.  ABDOMEN:  Soft and nontender.  Unable to palpate liver edge.  EXTREMITIES:  No frank pitting edema.  Distal pulses are 1+.  SKIN:  Warm and dry.  MUSCULOSKELETAL:  No kyphosis noted.  NEUROPSYCHIATRIC:  The patient is alert and oriented x3.  Affect is  somewhat flat.   IMPRESSION AND RECOMMENDATIONS:  1. Cardiovascular disease, status post anterior wall myocardial      infarction earlier in the year treated with drug-eluting stent      placement to the left anterior descending.  I discussed dual      antiplatelet therapy with Mr. Enis and it's important in terms of      stent thrombosis.  We provided paperwork for the Plavix assistance      program.  He also knows to check with the [facility]      Department.  It is not entirely clear that he is taking all the      medicines listed and we asked him to call us back with his      medication bottles in hand, so we can verify exactly what medicines      he is on at this point.  He should not be on 2 beta-blockers and      this can be straightened out when more information is available.      Otherwise, we will plan to bring him back over the next month and      continue to titrate therapy from there.  2. Hyperlipidemia, presently on  high-dose simvastatin.  Followup lipid      profile and liver function tests will follow down the road.  3. Left ventricular dysfunction associated with recent myocardial      infarction.  Once the patient's medical regimen is stabilized, a      followup echocardiogram can be obtained.  4. Otherwise, he will continue followup with Dr. Virgina Organ for his      diabetes management.     Jonelle Sidle, MD  Electronically Signed    SGM/MedQ  DD: 03/19/2008  DT: 03/20/2008  Job #: [id number]   cc:   Colon Branch, MD

## 2010-06-03 NOTE — Assessment & Plan Note (Signed)
[facility]                          [facility] NOTE   NAME:[name], [name]                           MRN:          [id number]  DATE:04/23/2008                            DOB:          1965/11/08    PRIMARY CARE PHYSICIAN:  Dr. Colon [name].   REASON FOR VISIT:  Scheduled followup.   HISTORY OF PRESENT ILLNESS:  [name] [name] returns for regular visit.  He  reports compliance with his medications which are outlined below.  We  have reviewed these with him on a number of occasions.  He is not  reporting any progressive anginal symptoms.  He had a followup  echocardiogram done just recently demonstrating stable left ventricular  systolic function at 35-40% with anteroapical wall motion abnormalities  consistent with his infarct.  He had otherwise trace mitral  regurgitation and tricuspid regurgitation.  He is due to see Dr. Virgina [name]  back next week and I have mentioned to him obtaining a followup lipid  profile.  During his hospital evaluation with acute infarct in February,  his LDL cholesterol was 157 with an HDL of 25, triglycerides 149, and  total cholesterol of 213.  He has been tolerating high-dose simvastatin.   ALLERGIES:  No known drug allergies.   PRESENT MEDICATIONS:  1. Plavix 75 mg p.o. daily.  2. Metformin 500 mg p.o. b.i.d.  3. Lantus as directed.  4. Lasix 20 mg p.o. daily.  5. Carvedilol 3.25 mg p.o. b.i.d.  6. Simvastatin 80 mg p.o. at bedtime.  7. Glipizide ER 10 mg p.o. daily.  8. Niaspan 500 mg p.o. daily.  9. Sublingual nitroglycerin 0.4 mg p.r.n.  10.Tramadol p.r.n.   REVIEW OF SYSTEMS:  As outlined above.  No claudication.  No  palpitations.  Otherwise, reviewed and negative.   PHYSICAL EXAMINATION:  VITAL SIGNS:  Blood pressure is 132/87, heart  rate is 89, weight is 237 pounds, respirations 18.  GENERAL:  The patient is comfortable and in no acute distress.  HEENT:  Conjunctivae is normal.  Oropharynx is clear.  NECK:  Supple.  No elevated jugular venous pressure.  No loud bruits.  LUNGS:  Clear without loud breathing at rest.  Diminished breath sounds  noted.  CARDIAC:  Irregular rate and rhythm.  No S3 gallop or pericardial rub.  ABDOMEN:  Soft, nontender with active bowel sounds.  EXTREMITIES:  No pitting edema.  Distal pulses are 1+.  SKIN:  Warm and dry.  MUSCULOSKELETAL:  No kyphosis noted.  NEUROPSYCHIATRIC:  The patient is alert and oriented x3.  Affect is  appropriate.   IMPRESSION AND RECOMMENDATIONS:  1. Cardiovascular disease status post anterior wall myocardial      infarction in February managed with drug-eluting stent placement to      the left anterior descending.  He has a left ventricular ejection      fraction of 35-40% with anteroapical wall motion abnormalities and      is symptomatically stable on medical therapy.  I plan to advance      carvedilol to 6.25 mg p.o. b.i.d.  He is  due to see Dr. Virgina [name]      later this month and should have lipids obtained around that time.      We will otherwise plan to see him back in 4 months.  2. Hyperlipidemia, on high-dose simvastatin.  He is due for followup      liver function and lipids and will obtain these through Dr.      Waynard [name] office.  Goal LDL should be around 70.  3. Diabetes mellitus and hypertension, followed by Dr. Virgina [name].     Jonelle Sidle, MD  Electronically Signed    SGM/MedQ  DD: 04/23/2008  DT: 04/24/2008  Job #: [id number]   cc:   [name] [name]

## 2010-06-03 NOTE — Cardiovascular Report (Signed)
NAMEPACEN, WATFORD NO.:  [id number]   MEDICAL RECORD NO.:  [id number]          PATIENT TYPE:  INP   LOCATION:  2915                         FACILITY:  [facility]   PHYSICIAN:  Verne Carrow, MDDATE OF BIRTH:  1965/04/22   DATE OF PROCEDURE:  DATE OF DISCHARGE:  02/21/2008                            CARDIAC CATHETERIZATION   PROCEDURES PERFORMED:  1. Left heart catheterization.  2. Selective coronary angiography.  3. Left ventricular angiogram.  4. Thrombectomy of the proximal left anterior descending.  5. Percutaneous coronary intervention with placement of a drug-eluting      stent in the proximal left anterior descending.  6. Placement of an Angio-Seal femoral artery closure device.   OPERATOR:  Verne Carrow, MD   INDICATION:  Anterior ST-elevation myocardial infarction.   DETAILS OF THE PROCEDURE:  The patient was brought emergently to the  cardiac catheterization laboratory via the [facility].  The  informed consent was applied on an emergency basis.  I did discuss the  procedure with the patient prior to starting the case.  The patient was  having 10/10 chest pain prior to the catheterization.  The right groin  was prepped and draped in a sterile fashion.  A 1% lidocaine was used  for local anesthesia.  A 6-French sheath was inserted into the right  femoral artery without difficulty.  I attempted to selectively engage  the right coronary artery with a JR4 catheter but was unable to  successfully do so.  I was able to have the catheter outside the ostium  and was able to inject and see that this vessel was patent.  I then  selectively engaged the left coronary system with a JL4 catheter.  Eventually, a JL4 guide was used to engage the left main coronary  artery.  The proximal LAD was found to be totally occluded secondary to  a ruptured plaque and thrombus.  The patient was given a bolus of  Angiomax and was started on a drip.  He  was given 600 mg of Plavix on  the cath table.  A Cougar intracoronary wire was passed down through the  area of total occlusion and into the distal LAD.  A Fetch thrombectomy  catheter was then passed over the wire and used to aspirate at the site  of total occlusion.  This did establish a small amount of channel that  we were able to see that the vessel filled distally and that the wire  was in the correct location.  At this point, a 2.5 x 15 mm Apex balloon  was used for predilatation of the lesion.  An Endeavor 3.0 x 15 mm drug-  eluting stent was then deployed in the proximal LAD.  A 3.25 x 12 mm  noncompliant balloon was used for postdilatation of the stent.  I did  give adenosine 36 mcg and nitroglycerin 200 mcg down the coronaries  prior to taking a final shot.  There was excellent angiographic result  with TIMI-3 flow.  The flow prior to the intervention was TIMI 0.  The  stenosis prior  to the intervention was 100% and following the  intervention was 0%.  The patient tolerated the procedure well.  An  Angio-Seal femoral artery closure device was placed in the right femoral  artery.   HEMODYNAMIC DATA:  LV pressure 134/24, end-diastolic pressure 37,  central aortic pressure 135/91.   ANGIOGRAPHIC FINDINGS:  1. The left main coronary artery had no evidence of disease.  2. The left anterior descending had a 100% proximal occlusion      secondary to thrombus.  3. Circumflex artery was a moderate-sized vessel that gave off 1      obtuse marginal branch and was very small in the AV groove.  There      were luminal irregularities in the midportion of this vessel.  4. The ramus intermedius was a large vessel that had no obstructive      disease.  5. The right coronary artery was a large dominant vessel that had      serial 30% lesions in the midportion of the vessel.  6. Left ventricular angiogram was performed with a hand injection only      since the patient had elevated  end-diastolic pressures.  The      systolic function appeared to be preserved; however, there was not      complete opacification of the left ventricle.  The ejection      fraction was estimated at 45-50%.   IMPRESSION:  1. Acute anterior ST-elevation myocardial infarction status post      placement of a drug-eluting stent in the proximal left anterior      descending.  2. Single-vessel coronary artery disease.  3. Preserved left ventricular systolic function.   RECOMMENDATIONS:  The patient will be continued on aspirin 325 mg and  Plavix 150 mg once daily for 7 days.  He will then be continued on  aspirin 325 mg once daily and Plavix 75 mg once daily for at least a  year.  We will also start a beta-blocker, a statin, and ACE inhibitor.  I will replace his potassium today.  He will be started on a sliding  scale insulin for coverage.  We will obtain an echocardiogram tomorrow  to assess his left ventricular function.      Verne Carrow, MD  Electronically Signed     CM/MEDQ  D:  02/21/2008  T:  02/22/2008  Job:  [id number]

## 2010-07-21 ENCOUNTER — Other Ambulatory Visit (HOSPITAL_COMMUNITY): Payer: Medicare Other | Admitting: Radiology

## 2010-10-30 ENCOUNTER — Telehealth: Payer: Self-pay | Admitting: Cardiovascular Disease

## 2010-10-30 NOTE — Telephone Encounter (Signed)
Pharmacy calling wanting to check on status of pt diabetic supply. Please return call to discuss further.

## 2010-10-31 NOTE — Telephone Encounter (Signed)
Pt returning your call

## 2010-10-31 NOTE — Telephone Encounter (Signed)
Tried number listed above as call back number. No answer and no voicemail. Unable to leave message. Home and cell numbers listed have been disconnected.

## 2010-10-31 NOTE — Telephone Encounter (Signed)
Phone number [phone] does not receive incoming phone calls. Correct number is [phone]. I called this number and left message with pt's mother for pt to call back.

## 2010-10-31 NOTE — Telephone Encounter (Signed)
Pt calling wanting to check on status of diabetic supply. Pt is suppose to get MD ok for pt to get meter and C-PAP. Please return pt call at his mother's house, Berna Bue: [phone]

## 2010-10-31 NOTE — Telephone Encounter (Signed)
Spoke with pt who is requesting Dr. Clifton James complete paperwork for him to get new diabetic meter and C-PAP supplies. These were not initially prescribed by Dr.McAlhany.  I told pt he should contact provider that originally prescribed these. He does not remember who this was so is going to contact MD in [place] to arrange primary care.  If unable to arrange he will go to urgent care.

## 2011-03-13 ENCOUNTER — Ambulatory Visit (INDEPENDENT_AMBULATORY_CARE_PROVIDER_SITE_OTHER): Payer: Medicare Other | Admitting: Cardiovascular Disease

## 2011-03-13 ENCOUNTER — Encounter: Payer: Self-pay | Admitting: Cardiovascular Disease

## 2011-03-13 VITALS — BP 136/80 | HR 77 | Resp 18 | Ht 70.0 in | Wt 239.8 lb

## 2011-03-13 DIAGNOSIS — I255 Ischemic cardiomyopathy: Secondary | ICD-10-CM

## 2011-03-13 DIAGNOSIS — I2589 Other forms of chronic ischemic heart disease: Secondary | ICD-10-CM

## 2011-03-13 DIAGNOSIS — I2510 Atherosclerotic heart disease of native coronary artery without angina pectoris: Secondary | ICD-10-CM

## 2011-03-13 MED ORDER — CARVEDILOL 6.25 MG PO TABS: 6.2500 mg | ORAL_TABLET | Freq: Two times a day (BID) | ORAL | Status: DC

## 2011-03-13 MED ORDER — SIMVASTATIN 40 MG PO TABS: 40.0000 mg | ORAL_TABLET | Freq: Every evening | ORAL | Status: DC

## 2011-03-13 MED ORDER — LISINOPRIL 5 MG PO TABS: 5.0000 mg | ORAL_TABLET | Freq: Every day | ORAL | Status: DC

## 2011-03-13 MED ORDER — ASPIRIN EC 81 MG PO TBEC: 81.0000 mg | DELAYED_RELEASE_TABLET | Freq: Every day | ORAL | Status: DC

## 2011-03-13 NOTE — Assessment & Plan Note (Signed)
As above, will resume meds and repeat echo in 3 months. He is to weigh himself daily.

## 2011-03-13 NOTE — Patient Instructions (Signed)
Your physician recommends that you schedule a follow-up appointment in: 3 months.   Your physician has requested that you have an echocardiogram. Echocardiography is a painless test that uses sound waves to create images of your heart. It provides your doctor with information about the size and shape of your heart and how well your heart's chambers and valves are working. This procedure takes approximately one hour. There are no restrictions for this procedure. To be done in 3 months prior to office visit with Dr. Clifton James   Your physician has recommended you make the following change in your medication: Resume Coreg 6.25 mg by mouth twice daily, Aspirin 81 mg by mouth daily, Lisinopril 5 mg by mouth daily, Simvastatin 40 mg by mouth daily

## 2011-03-13 NOTE — Assessment & Plan Note (Signed)
S/p anterior STEMI February 2010 with occluded LAD, s/p DES. He has stopped all of his cardiac meds AGAIN. I will restart Coreg 6.25 mg po BID, ASA 81 mg po Qdaily, Lisinopril 5 mg po QDaily, Simvastin 40 mg po QHS. He is encouraged to see a primary care doctor for his diabetic management. He has an appt for next month. He is having no chest pain. Will get him back on his meds, repeat echo in 3 months and see him back. He may need consideration for an ICD at that time.

## 2011-03-13 NOTE — Progress Notes (Signed)
History of Present Illness: 46 yo WM with history of CAD with anterior STEMI February 2010 with drug eluting stent placed in the proximal LAD, DM, hyperlipidemia here today for cardiac follow up. He has been followed over the last two years in our [place] office by Dr. Diona Browner but wished to be seen in [place]. His last visit in the [place] office was in February 2011, he missed his 4 month follow up and his follow up echo.He was admitted to [facility] March 20, 2010 with chest pain. He ruled out for an MI with serial cardiac enzymes. Outpatient myoview was arranged. He could not get his heart rate up with exercise so Lexiscan was used. This showed scar in the anterior wall with mild ischemia and EF of 33%. His post STEMI EF leaving the hospital was 35% by echo in February 2010.  I saw him in April 2012. He had not been taking any of his medications. He had stopped all of the cardiac meds and diabetic meds. He had not seen a primary care physician.   He is here today for follow up. He ran out of his medicines 6 months ago. He has not taken any medications over the last 6 months. He is taking Metformin 500 mg po Qdaily but tells me his BS are not under control. He has not had chest pain. Occasional SOB.He also has CPAP but has not been using for his OSA. No complaints today. He is a non-smoker.  Primary Care Physician: Samuel Jester in [place]  Last Lipid Profile:  Past Medical History  Diagnosis Date  . Coronary artery disease     DES LAD 2/10, LVEF 50%  . Diabetes mellitus     TYPE II  . Hyperlipidemia   . Heart attack   . OSA (obstructive sleep apnea)   . Fatty liver     H/O ELEVATED LIVER ENZYMES    No past surgical history on file.  Current Outpatient Prescriptions  Medication Sig Dispense Refill  . aspirin EC 81 MG EC tablet Take 1 tablet (81 mg total) by mouth daily.  30 tablet  0  . carvedilol (COREG) 6.25 MG tablet Take 1 tablet (6.25 mg total) by mouth 2 (two) times  daily.  60 tablet  8  . lisinopril (PRINIVIL,ZESTRIL) 5 MG tablet Take 1 tablet (5 mg total) by mouth daily.  30 tablet  6  . nitroGLYCERIN (NITROSTAT) 0.4 MG SL tablet Place 0.4 mg under the tongue every 5 (five) minutes as needed.        . simvastatin (ZOCOR) 40 MG tablet Take 1 tablet (40 mg total) by mouth at bedtime.  30 tablet  6    No Known Allergies  History   Social History  . Marital Status: Married    Spouse Name: N/A    Number of Children: N/A  . Years of Education: N/A   Occupational History  . Not on file.   Social History Main Topics  . Smoking status: Never Smoker   . Smokeless tobacco: Not on file  . Alcohol Use: No  . Drug Use: Not on file  . Sexually Active: Not on file   Other Topics Concern  . Not on file   Social History Narrative  . No narrative on file    Family History  Problem Relation Age of Onset  . Coronary artery disease      UNKNOWN  . Heart disease Father     CAD s/p CABG  Review of Systems:  As stated in the HPI and otherwise negative.   BP 136/80  Pulse 77  Resp 18  Ht 5\' 10"  (1.778 m)  Wt 239 lb 12.8 oz (108.773 kg)  BMI 34.41 kg/m2  Physical Examination: General: Well developed, well nourished, NAD HEENT: OP clear, mucus membranes moist SKIN: warm, dry. No rashes. Neuro: No focal deficits Musculoskeletal: Muscle strength 5/5 all ext Psychiatric: Mood and affect normal Neck: No JVD, no carotid bruits, no thyromegaly, no lymphadenopathy. Lungs:Clear bilaterally, no wheezes, rhonci, crackles Cardiovascular: Regular rate and rhythm. No murmurs, gallops or rubs. Abdomen:Soft. Bowel sounds present. Non-tender.  Extremities: No lower extremity edema. Pulses are 2 + in the bilateral DP/PT.  EKG:

## 2011-05-25 ENCOUNTER — Ambulatory Visit (HOSPITAL_COMMUNITY): Payer: Medicare Other | Attending: Cardiovascular Disease

## 2011-06-08 ENCOUNTER — Ambulatory Visit: Payer: Medicare Other | Admitting: Cardiovascular Disease

## 2011-07-06 ENCOUNTER — Encounter (HOSPITAL_COMMUNITY): Payer: Self-pay | Admitting: Emergency Medicine

## 2011-07-06 ENCOUNTER — Emergency Department (HOSPITAL_COMMUNITY): Payer: Medicare Other

## 2011-07-06 ENCOUNTER — Inpatient Hospital Stay (HOSPITAL_COMMUNITY)
Admission: EM | Admit: 2011-07-06 | Discharge: 2011-07-07 | DRG: 313 | Disposition: A | Discharge status: Home / Self Care | Payer: Medicare Other | Attending: Cardiovascular Disease | Admitting: Cardiovascular Disease

## 2011-07-06 DIAGNOSIS — I2589 Other forms of chronic ischemic heart disease: Secondary | ICD-10-CM | POA: Diagnosis present

## 2011-07-06 DIAGNOSIS — R079 Chest pain, unspecified: Secondary | ICD-10-CM

## 2011-07-06 DIAGNOSIS — I255 Ischemic cardiomyopathy: Secondary | ICD-10-CM | POA: Diagnosis present

## 2011-07-06 DIAGNOSIS — R7989 Other specified abnormal findings of blood chemistry: Secondary | ICD-10-CM | POA: Diagnosis present

## 2011-07-06 DIAGNOSIS — Z91199 Patient's noncompliance with other medical treatment and regimen due to unspecified reason: Secondary | ICD-10-CM

## 2011-07-06 DIAGNOSIS — E119 Type 2 diabetes mellitus without complications: Secondary | ICD-10-CM | POA: Diagnosis present

## 2011-07-06 DIAGNOSIS — Z9861 Coronary angioplasty status: Secondary | ICD-10-CM

## 2011-07-06 DIAGNOSIS — K7689 Other specified diseases of liver: Secondary | ICD-10-CM | POA: Diagnosis present

## 2011-07-06 DIAGNOSIS — R945 Abnormal results of liver function studies: Secondary | ICD-10-CM | POA: Diagnosis present

## 2011-07-06 DIAGNOSIS — Z79899 Other long term (current) drug therapy: Secondary | ICD-10-CM

## 2011-07-06 DIAGNOSIS — R0789 Other chest pain: Principal | ICD-10-CM | POA: Diagnosis present

## 2011-07-06 DIAGNOSIS — I2510 Atherosclerotic heart disease of native coronary artery without angina pectoris: Secondary | ICD-10-CM | POA: Diagnosis present

## 2011-07-06 DIAGNOSIS — Z9119 Patient's noncompliance with other medical treatment and regimen: Secondary | ICD-10-CM

## 2011-07-06 DIAGNOSIS — G4733 Obstructive sleep apnea (adult) (pediatric): Secondary | ICD-10-CM | POA: Diagnosis present

## 2011-07-06 DIAGNOSIS — E785 Hyperlipidemia, unspecified: Secondary | ICD-10-CM | POA: Diagnosis present

## 2011-07-06 DIAGNOSIS — I252 Old myocardial infarction: Secondary | ICD-10-CM

## 2011-07-06 HISTORY — DX: Unspecified osteoarthritis, unspecified site: M19.90

## 2011-07-06 HISTORY — DX: Ischemic cardiomyopathy: I25.5

## 2011-07-06 HISTORY — DX: Patient's noncompliance with other medical treatment and regimen due to unspecified reason: Z91.199

## 2011-07-06 HISTORY — DX: Patient's noncompliance with other medical treatment and regimen: Z91.19

## 2011-07-06 LAB — DIFFERENTIAL
Basophils Absolute: 0.1 10*3/uL (ref 0.0–0.1)
Lymphocytes Relative: 48 % — ABNORMAL HIGH (ref 12–46)
Monocytes Relative: 16 % — ABNORMAL HIGH (ref 3–12)
Neutro Abs: 2.3 10*3/uL (ref 1.7–7.7)
Neutrophils Relative %: 35 % — ABNORMAL LOW (ref 43–77)

## 2011-07-06 LAB — COMPREHENSIVE METABOLIC PANEL
ALT: 109 U/L — ABNORMAL HIGH (ref 0–53)
AST: 60 U/L — ABNORMAL HIGH (ref 0–37)
Albumin: 3.7 g/dL (ref 3.5–5.2)
Alkaline Phosphatase: 103 U/L (ref 39–117)
Potassium: 4.4 mEq/L (ref 3.5–5.1)
Sodium: 136 mEq/L (ref 135–145)
Total Protein: 7.4 g/dL (ref 6.0–8.3)

## 2011-07-06 LAB — CBC
HCT: 37.7 % — ABNORMAL LOW (ref 39.0–52.0)
HCT: 36.7 % — ABNORMAL LOW (ref 39.0–52.0)
MCH: 28.6 pg (ref 26.0–34.0)
MCHC: 33.8 g/dL (ref 30.0–36.0)
MCV: 84.8 fL (ref 78.0–100.0)
Platelets: 228 10*3/uL (ref 150–400)
RDW: 12.9 % (ref 11.5–15.5)
RDW: 12.8 % (ref 11.5–15.5)
WBC: 6.6 10*3/uL (ref 4.0–10.5)

## 2011-07-06 LAB — CARDIAC PANEL(CRET KIN+CKTOT+MB+TROPI)
Relative Index: INVALID (ref 0.0–2.5)
Relative Index: INVALID (ref 0.0–2.5)
Troponin I: <0.3 ng/mL (ref <0.30)
Troponin I: <0.3 ng/mL (ref <0.30)

## 2011-07-06 LAB — GLUCOSE, CAPILLARY
Glucose-Capillary: 213 mg/dL — ABNORMAL HIGH (ref 70–99)
Glucose-Capillary: 88 mg/dL (ref 70–99)

## 2011-07-06 LAB — LIPASE, BLOOD: Lipase: 29 U/L (ref 11–59)

## 2011-07-06 LAB — APTT: aPTT: 37 seconds (ref 24–37)

## 2011-07-06 LAB — AMYLASE: Amylase: 45 U/L (ref 0–105)

## 2011-07-06 MED ORDER — SODIUM CHLORIDE 0.9 % IJ SOLN: 3.0000 mL | INTRAMUSCULAR | Status: DC | PRN

## 2011-07-06 MED ORDER — ZOLPIDEM TARTRATE 5 MG PO TABS: 10.0000 mg | ORAL_TABLET | Freq: Every evening | ORAL | Status: DC | PRN

## 2011-07-06 MED ORDER — ACETAMINOPHEN 325 MG PO TABS
650.0000 mg | ORAL_TABLET | ORAL | Status: DC | PRN
  Administered 2011-07-06: 650 mg via ORAL
  Filled 2011-07-06: qty 2

## 2011-07-06 MED ORDER — ASPIRIN 81 MG PO CHEW
324.0000 mg | CHEWABLE_TABLET | Freq: Once | ORAL | Status: AC
  Administered 2011-07-06: 324 mg via ORAL
  Filled 2011-07-06: qty 4

## 2011-07-06 MED ORDER — SODIUM CHLORIDE 0.9 % IJ SOLN
3.0000 mL | Freq: Two times a day (BID) | INTRAMUSCULAR | Status: DC
  Administered 2011-07-06 (×2): 3 mL via INTRAVENOUS

## 2011-07-06 MED ORDER — LISINOPRIL 5 MG PO TABS
5.0000 mg | ORAL_TABLET | Freq: Every day | ORAL | Status: DC
  Administered 2011-07-06 – 2011-07-07 (×2): 5 mg via ORAL
  Filled 2011-07-06 (×2): qty 1

## 2011-07-06 MED ORDER — DIAZEPAM 5 MG PO TABS: 5.0000 mg | ORAL_TABLET | ORAL | Status: DC

## 2011-07-06 MED ORDER — SIMVASTATIN 40 MG PO TABS
40.0000 mg | ORAL_TABLET | Freq: Every day | ORAL | Status: DC
  Administered 2011-07-06: 40 mg via ORAL
  Filled 2011-07-06 (×2): qty 1

## 2011-07-06 MED ORDER — CARVEDILOL 6.25 MG PO TABS
6.2500 mg | ORAL_TABLET | Freq: Two times a day (BID) | ORAL | Status: DC
  Administered 2011-07-06: 6.25 mg via ORAL
  Filled 2011-07-06 (×4): qty 1

## 2011-07-06 MED ORDER — ASPIRIN 81 MG PO CHEW
324.0000 mg | CHEWABLE_TABLET | ORAL | Status: AC
  Administered 2011-07-07: 324 mg via ORAL
  Filled 2011-07-06: qty 4

## 2011-07-06 MED ORDER — NITROGLYCERIN 0.4 MG SL SUBL
0.4000 mg | SUBLINGUAL_TABLET | SUBLINGUAL | Status: DC | PRN
  Administered 2011-07-06: 0.4 mg via SUBLINGUAL
  Filled 2011-07-06: qty 25

## 2011-07-06 MED ORDER — SODIUM CHLORIDE 0.9 % IV SOLN
INTRAVENOUS | Status: DC
  Administered 2011-07-07: 04:00:00 via INTRAVENOUS

## 2011-07-06 MED ORDER — ASPIRIN EC 81 MG PO TBEC
81.0000 mg | DELAYED_RELEASE_TABLET | Freq: Every day | ORAL | Status: DC
  Administered 2011-07-07: 81 mg via ORAL
  Filled 2011-07-06: qty 1

## 2011-07-06 MED ORDER — GLIPIZIDE 10 MG PO TABS
10.0000 mg | ORAL_TABLET | Freq: Every day | ORAL | Status: DC
  Administered 2011-07-06: 10 mg via ORAL
  Filled 2011-07-06 (×3): qty 1

## 2011-07-06 MED ORDER — HEPARIN SODIUM (PORCINE) 5000 UNIT/ML IJ SOLN
5000.0000 [IU] | Freq: Three times a day (TID) | INTRAMUSCULAR | Status: DC
  Administered 2011-07-06 – 2011-07-07 (×4): 5000 [IU] via SUBCUTANEOUS
  Filled 2011-07-06 (×6): qty 1

## 2011-07-06 MED ORDER — SODIUM CHLORIDE 0.9 % IV SOLN: 250.0000 mL | INTRAVENOUS | Status: DC | PRN

## 2011-07-06 MED ORDER — ONDANSETRON HCL 4 MG/2ML IJ SOLN: 4.0000 mg | Freq: Four times a day (QID) | INTRAMUSCULAR | Status: DC | PRN

## 2011-07-06 MED ORDER — INSULIN ASPART 100 UNIT/ML ~~LOC~~ SOLN
0.0000 [IU] | Freq: Three times a day (TID) | SUBCUTANEOUS | Status: DC
  Administered 2011-07-06: 5 [IU] via SUBCUTANEOUS
  Administered 2011-07-07: 2 [IU] via SUBCUTANEOUS

## 2011-07-06 NOTE — H&P (Signed)
History and Physical  Patient ID: [name] [name] MRN: [id number], SOB: [birth year] 46 y.o. Date of Encounter: 07/06/2011, 11:17 AM  Primary Physician: [facility] Primary Cardiologist: Dr. Clifton James  Chief Complaint: chest pain  HPI: 46 y.o. male w/ PMHx significant for DMII, HLD, CAD s/p DES to LAD '10, fatty liver, and medical noncompliance who presented to [facility] on 07/06/2011 with complaints of chest pain.  February 2010 presented with anterior STEMI with DES to prox LAD. March 2012 presented with chest pain and ruled out for MI. Outpatient myoview revealed scar in the anterior wall with mild ischemia and EF 33% (previously 35%). In April '12 he was seen by Dr. Clifton James at which time the patient reported not taking any of his cardiac or diabetes medications. He was last seen Feb '13 where he again reported not taking his medications for the last 6mos. He denied chest pain at that time. His Coreg, ASA, Lisinopril, and simvastatin were restarted with plans for repeat echo in 3mos (not completed).  Patient reports experiencing a burning in his chest last Thursday. This occurred at rest after eating dinner. It was associated with nausea and diaphoresis. No radiation, dizziness, or sob. He had some relief with Alka-seltzer. It lasted a few hours then went away. On Friday he experienced shooting pain between his shoulder blades with radiation into his neck that lasted all night. He felt fine on Saturday and Sunday. Last night while sitting at work he again experienced burning in his chest with the same associated symptoms as well as "dry heaving". When the burning did not go away he presented to the ED. He attributed the pain to heartburn, but wanted to come get checked due to his heart history. His symptoms are NOT similar to the symptoms he had prior to his MI in 2010.  He denies any sob or chest pain with exertion prior to these episodes. Has had a dry cough "with the  wet weather". No recent illness or fevers. Some chills a few nights ago. No abd pain, melena/hematochezia.  EKG revealed NSR with no acute ST changes. CXR was without acute cardiopulmonary abnormalities. Labs are significant for normal troponin, elevated LFTs, WBC 6.6. He was given SL NTG and ASA. Currently chest pain free   Past Medical History  Diagnosis Date  . Coronary artery disease     DES LAD 2/10, LVEF 50%  . Diabetes mellitus     TYPE II  . Hyperlipidemia   . Heart attack   . OSA (obstructive sleep apnea)   . Fatty liver     H/O ELEVATED LIVER ENZYMES     03/26/10 - Myoview Exercise Capacity: Good exercise capacity.  BP Response: Normal blood pressure response.  Clinical Symptoms: SOB  ECG Impression: No significant ST segment change suggestive of ischemia.  Overall Impression With stress on treadmill the patient was not reaching desired heart rate, so Lexiscan given. The nuclear images reveal old large scar affecting the anterior wall, anterior septum, and the apex. There is mild peri-infarct ischemia. EF: 33 %  02/22/08 - 2D Echocardiogram SUMMARY - Septal,apical and distal anterior wall akinesis The left ventricle was moderately dilated. Overall left ventricular systolic function was moderately to markedly decreased. Left ventricular ejection fraction was estimated , range being 35 % to 40 %. Left ventricular wall thickness was at the upper limits of normal. - The aortic valve was mildly calcified.  2010 - Cardiac Cath HEMODYNAMIC DATA: LV pressure 134/24, end-diastolic pressure 37,  central aortic pressure 135/91.  ANGIOGRAPHIC FINDINGS:  1. The left main coronary artery had no evidence of disease.  2. The left anterior descending had a 100% proximal occlusion secondary to thrombus.  3. Circumflex artery was a moderate-sized vessel that gave off 1 obtuse marginal branch and was very small in the AV groove. There were luminal irregularities in the midportion of this vessel.    4. The ramus intermedius was a large vessel that had no obstructive disease.  5. The right coronary artery was a large dominant vessel that had serial 30% lesions in the midportion of the vessel.  6. Left ventricular angiogram was performed with a hand injection only since the patient had elevated end-diastolic pressures. The systolic function appeared to be preserved; however, there was not complete opacification of the left ventricle. The ejection  fraction was estimated at 45-50%.  IMPRESSION:  1. Acute anterior ST-elevation myocardial infarction status post placement of a drug-eluting stent in the proximal left anterior  descending.  2. Single-vessel coronary artery disease.  3. Preserved left ventricular systolic function.  RECOMMENDATIONS: The patient will be continued on aspirin 325 mg and Plavix 150 mg once daily for 7 days. He will then be continued on  aspirin 325 mg once daily and Plavix 75 mg once daily for at least a year. We will also start a beta-blocker, a statin, and ACE inhibitor.  I will replace his potassium today. He will be started on a sliding  scale insulin for coverage. We will obtain an echocardiogram tomorrow  to assess his left ventricular function.  Surgical History: History reviewed. No pertinent past surgical history.   Home Meds: Medication Sig  glipiZIDE (GLUCOTROL) 10 MG tablet Take 10 mg by mouth daily.  lisinopril (PRINIVIL,ZESTRIL) 5 MG tablet Take 5 mg by mouth daily.  metFORMIN (GLUCOPHAGE) 1000 MG tablet Take 1,000 mg by mouth 2 (two) times daily with a meal.  OVER THE COUNTER MEDICATION Take 2 Packages by mouth daily as needed. For cough.heartburn Alka seltzer.  simvastatin (ZOCOR) 40 MG tablet Take 40 mg by mouth daily.    Allergies: No Known Allergies  History   Social History  . Marital Status: Married    Spouse Name: N/A    Number of Children: N/A  . Years of Education: N/A   Occupational History  . Not on file.   Social History Main  Topics  . Smoking status: Never Smoker   . Smokeless tobacco: Not on file  . Alcohol Use: No  . Drug Use: Not on file  . Sexually Active: Not on file   Other Topics Concern  . Not on file   Social History Narrative  . No narrative on file     Family History  Problem Relation Age of Onset  . Coronary artery disease      UNKNOWN  . Heart disease Father     CAD s/p CABG    Review of Systems: General: (+) chills; negative for fever, night sweats or weight changes.  Cardiovascular: (+) chest pain; negative for shortness of breath, dyspnea on exertion, edema, orthopnea, palpitations, paroxysmal nocturnal dyspnea  Dermatological: negative for rash Respiratory: (+) dry cough; negative for wheezing Urologic: negative for hematuria Abdominal: (+) nausea; negative for vomiting, diarrhea, bright red blood per rectum, melena, or hematemesis Neurologic: negative for visual changes, syncope, or dizziness All other systems reviewed and are otherwise negative except as noted above.  Labs:   Component Value Date   WBC 6.6 07/06/2011  HGB 13.0 07/06/2011   HCT 37.7* 07/06/2011   MCV 84.7 07/06/2011   PLT 228 07/06/2011    Lab 07/06/11 0903  NA 136  K 4.4  CL 100  CO2 24  BUN 12  CREATININE 0.79  CALCIUM 9.4  PROT 7.4  BILITOT 0.7  ALKPHOS 103  ALT 109*  AST 60*  GLUCOSE 175*   Basename 07/06/11 0903  TROPONINI <0.30    Radiology/Studies:   07/06/2011 - Chest Port 1 View  Findings: Cardiomediastinal silhouette is stable.  No acute infiltrate or pleural effusion.  No pulmonary edema.  Bony thorax is stable.  IMPRESSION: No active disease.  No significant change.      EKG: 07/06/11 @ 0838 - NSR 88bpm, no acute ST/T changes  Physical Exam: Blood pressure 95/67, pulse 86, temperature 97.9 F (36.6 C), temperature source Oral, resp. rate 20, SpO2 99.00%. General: Overweight white male, in no acute distress. Head: Normocephalic, atraumatic, sclera non-icteric, nares are  without discharge Neck: Supple. Negative for carotid bruits. JVD not elevated. Lungs: Clear bilaterally to auscultation without wheezes, rales, or rhonchi. Breathing is unlabored. Heart: RRR with S1 S2. No murmurs, rubs, or gallops appreciated. Abdomen: Soft, non-tender, non-distended with normoactive bowel sounds. No rebound/guarding. No obvious abdominal masses. Msk:  Strength and tone appear normal for age. Extremities: No edema. No clubbing or cyanosis. Distal pedal pulses are 2+ and equal bilaterally. Neuro: Alert and oriented X 3. Moves all extremities spontaneously. Psych:  Responds to questions appropriately with a normal affect.    ASSESSMENT AND PLAN:  46 y.o. male w/ PMHx significant for DMII, HLD, CAD s/p DES to LAD '10, fatty liver, and medical noncompliance who presented to [facility] on 07/06/2011 with complaints of chest pain.  1. Chest Pain 2. CAD s/p DES to LAD '10 3. Ischemic Cardiomyopathy 4. Hyperlipidemia 5. DM, Type 2 6. History of elevated LFTs 7. Medical Noncompliance  He has a history of CAD w/ DES to LAD in 2010 after presenting with anterior STEMI. Has had issues with medical noncompliance since that time. Admitted for chest pain rule out March 2012 with myoview showing anterior wall scarring with mild ischemia, EF 33%. EF was 35% at time of MI. Now presents with >24hrs atypical chest pain unlike sx prior to MI. Currently chest pain free. EKG nonacute. Initial troponin normal. LFTs are elevated similar to levels from hospitalization 2010 (normalized since then). Hepatitis serologies were normal 2010. With his h/o CAD and medical noncompliance will admit, monitor on tele, cycle cardiac enzymes, and plan for cardiac cath tomorrow. Obtain echo to reassess LV function. Check lipid panel in am. Cont statin, follow LFTs. Check Amylase/Lipase. Check A1C. Hold Metformin. Place on SSI. Place back on Coreg and ASA. Cont Lisinopril.   Signed, HOPE, JESSICA  PA-C 07/06/2011, 11:17 AM  Reccurent pains.  Nuclear scans always abnormal due to previous MI.  Given multiple w/us history of CAD and noncompliance favor repeat cath.  If EF is 33-35%  Patient should also be considered for AICD pending results of cath would consider EP consult  Charlton Haws

## 2011-07-06 NOTE — ED Notes (Signed)
Pt. Stated, I started what i thought was indigestion and its getting progressing ly worse.  Its more like a pressure. I've not had a good appetite.

## 2011-07-06 NOTE — ED Notes (Signed)
Pt reports midsternal chest pain x couple of days. States this am began with nausea/vomited x 1. Pain is non-radiating. Reports pain as pressure. States pain worse with deep breath. Reports pain in neck/between shoulder blades. Hx of MI 2010.

## 2011-07-06 NOTE — ED Notes (Signed)
Phlebotomist at bedside.

## 2011-07-06 NOTE — ED Notes (Signed)
Visitor x 1 in wait area 

## 2011-07-06 NOTE — ED Provider Notes (Signed)
History     CSN: [id number]  Arrival date & time 07/06/11  5284   First MD Initiated Contact with Patient 07/06/11 [phone]      Chief Complaint  Patient presents with  . Chest Pain    (Consider location/radiation/quality/duration/timing/severity/associated sxs/prior treatment) Patient is a 46 y.o. male presenting with chest pain. The history is provided by the patient.  Chest Pain The chest pain began 2 days ago. Chest pain occurs constantly. The chest pain is unchanged. The pain is associated with breathing. The severity of the pain is moderate. The quality of the pain is described as burning. The pain does not radiate. Chest pain is worsened by deep breathing. Primary symptoms include fatigue and nausea. Pertinent negatives for primary symptoms include no fever, no shortness of breath and no palpitations.  Pertinent negatives for associated symptoms include no lower extremity edema. He tried nothing for the symptoms.  His past medical history is significant for CAD. Past medical history comments: Patient has history of cad, had stent placed in 2010 by Dr. Sanjuana Kava     Past Medical History  Diagnosis Date  . Coronary artery disease     DES LAD 2/10, LVEF 50%  . Diabetes mellitus     TYPE II  . Hyperlipidemia   . Heart attack   . OSA (obstructive sleep apnea)   . Fatty liver     H/O ELEVATED LIVER ENZYMES    History reviewed. No pertinent past surgical history.  Family History  Problem Relation Age of Onset  . Coronary artery disease      UNKNOWN  . Heart disease Father     CAD s/p CABG    History  Substance Use Topics  . Smoking status: Never Smoker   . Smokeless tobacco: Not on file  . Alcohol Use: No      Review of Systems  Constitutional: Positive for fatigue. Negative for fever.  Respiratory: Negative for shortness of breath.   Cardiovascular: Positive for chest pain. Negative for palpitations.  Gastrointestinal: Positive for nausea.  All other systems  reviewed and are negative.    Allergies  Review of patient's allergies indicates no known allergies.  Home Medications   Current Outpatient Rx  Name Route Sig Dispense Refill  . ASPIRIN EC 81 MG PO TBEC Oral Take 1 tablet (81 mg total) by mouth daily. 30 tablet 0  . CARVEDILOL 6.25 MG PO TABS Oral Take 1 tablet (6.25 mg total) by mouth 2 (two) times daily. 60 tablet 11  . LISINOPRIL 5 MG PO TABS Oral Take 1 tablet (5 mg total) by mouth daily. 30 tablet 11  . SIMVASTATIN 40 MG PO TABS Oral Take 1 tablet (40 mg total) by mouth every evening. 30 tablet 11    BP 102/81  Pulse 91  Temp 97.9 F (36.6 C) (Oral)  Resp 20  SpO2 97%  Physical Exam  Nursing note and vitals reviewed. Constitutional: He is oriented to person, place, and time. He appears well-developed and well-nourished. No distress.  HENT:  Head: Normocephalic and atraumatic.  Mouth/Throat: Oropharynx is clear and moist.  Neck: Normal range of motion. Neck supple.  Cardiovascular: Normal rate and regular rhythm.   No murmur heard. Pulmonary/Chest: Effort normal and breath sounds normal. No respiratory distress. He has no wheezes.  Abdominal: Soft. Bowel sounds are normal. He exhibits no distension. There is no tenderness.  Musculoskeletal: Normal range of motion. He exhibits no edema.  Neurological: He is alert and oriented to person, place,  and time.  Skin: Skin is warm and dry. He is not diaphoretic.    ED Course  Procedures (including critical care time)  Labs Reviewed - No data to display No results found.   No diagnosis found.   Date: 07/06/2011  Rate: 88  Rhythm: normal sinus rhythm  QRS Axis: normal  Intervals: normal  ST/T Wave abnormalities: normal  Conduction Disutrbances:none  Narrative Interpretation:   Old EKG Reviewed: unchanged    MDM  As the patient has a history of prior stenting and CAD, I have consulted cardiology and the patient will be admitted to their service.  He is currently  pain-free after nitro with a thus-far negative workup.        Geoffery Lyons, MD 07/06/11 1226

## 2011-07-06 NOTE — ED Notes (Signed)
Cardiology at bedside.

## 2011-07-06 NOTE — Progress Notes (Signed)
Patient notified me of several tick bites that he has received. Called the PA on call and he advised to make a note so that they can address that tomorrow in rounding. Lajuana Matte, RN

## 2011-07-07 ENCOUNTER — Encounter (HOSPITAL_COMMUNITY): Payer: Self-pay

## 2011-07-07 ENCOUNTER — Encounter (HOSPITAL_COMMUNITY)
Admission: EM | Disposition: A | Discharge status: Home / Self Care | Payer: Self-pay | Source: Home / Self Care | Attending: Cardiovascular Disease

## 2011-07-07 DIAGNOSIS — R079 Chest pain, unspecified: Secondary | ICD-10-CM | POA: Diagnosis present

## 2011-07-07 DIAGNOSIS — R7989 Other specified abnormal findings of blood chemistry: Secondary | ICD-10-CM | POA: Diagnosis present

## 2011-07-07 DIAGNOSIS — R945 Abnormal results of liver function studies: Secondary | ICD-10-CM | POA: Diagnosis present

## 2011-07-07 DIAGNOSIS — I517 Cardiomegaly: Secondary | ICD-10-CM

## 2011-07-07 LAB — CBC
MCH: 28.6 pg (ref 26.0–34.0)
MCHC: 33.5 g/dL (ref 30.0–36.0)
MCV: 85.3 fL (ref 78.0–100.0)
Platelets: 225 10*3/uL (ref 150–400)

## 2011-07-07 LAB — GLUCOSE, CAPILLARY
Glucose-Capillary: 143 mg/dL — ABNORMAL HIGH (ref 70–99)
Glucose-Capillary: 146 mg/dL — ABNORMAL HIGH (ref 70–99)

## 2011-07-07 LAB — CARDIAC PANEL(CRET KIN+CKTOT+MB+TROPI)
CK, MB: 1.9 ng/mL (ref 0.3–4.0)
Total CK: 50 U/L (ref 7–232)
Troponin I: <0.3 ng/mL (ref <0.30)

## 2011-07-07 LAB — BASIC METABOLIC PANEL
Calcium: 9.3 mg/dL (ref 8.4–10.5)
Creatinine, Ser: 0.87 mg/dL (ref 0.50–1.35)
GFR calc non Af Amer: >90 mL/min (ref >90)
Sodium: 139 mEq/L (ref 135–145)

## 2011-07-07 LAB — LIPID PANEL
HDL: 10 mg/dL — ABNORMAL LOW (ref >39)
LDL Cholesterol: 28 mg/dL (ref 0–99)
VLDL: 72 mg/dL — ABNORMAL HIGH (ref 0–40)

## 2011-07-07 LAB — HEMOGLOBIN A1C: Mean Plasma Glucose: 183 mg/dL — ABNORMAL HIGH (ref <117)

## 2011-07-07 SURGERY — LEFT HEART CATHETERIZATION WITH CORONARY ANGIOGRAM
Anesthesia: LOCAL

## 2011-07-07 MED ORDER — NITROGLYCERIN 0.4 MG SL SUBL: 0.4000 mg | SUBLINGUAL_TABLET | SUBLINGUAL | Status: DC | PRN

## 2011-07-07 MED ORDER — PERFLUTREN LIPID MICROSPHERE 6.52 MG/ML IV SUSP
10.0000 uL/kg | Freq: Once | INTRAVENOUS | Status: AC
  Administered 2011-07-07: 1.155 mg via INTRAVENOUS
  Filled 2011-07-07: qty 2

## 2011-07-07 MED ORDER — CARVEDILOL 6.25 MG PO TABS: 6.2500 mg | ORAL_TABLET | Freq: Two times a day (BID) | ORAL | Status: DC

## 2011-07-07 MED ORDER — ASPIRIN 81 MG PO TBEC: 81.0000 mg | DELAYED_RELEASE_TABLET | Freq: Every day | ORAL | Status: AC

## 2011-07-07 NOTE — Progress Notes (Signed)
Pt inquiring about d/c home today; PA paged; will await callback.

## 2011-07-07 NOTE — Discharge Summary (Signed)
Discharge Summary   Patient ID: [name] [name] MRN: [id number], DOB/AGE: [birth year] 46 y.o. Admit date: 07/06/2011 D/C date:     07/07/2011   Primary Discharge Diagnoses:  1. Chest pain, atypical - ruled out for MI - GXT 07/07/11 was negative for ischemic changes 2. Ischemic cardiomyopathy - improved EF this admission to 45-50% 3. CAD - 02/2008: anterior STEMI s/p DES to prox LAD - 03/2010 - CP, ruled out for MI with outpatient Myoview scar in the anterior wall with mild ischemia & EF 33%, previously 35% - 04/2010 & 02/2011 - was not taking any medications - GXT normal this admission as above - patient was unsure if he was taking statin at home - he will confirm and plans to discuss with Dr. Clifton James at his office visit - if he was not, will need f/u LFTs given h/o abnormal liver function tests  Secondary Discharge Diagnoses:  1. Diabetes mellitus 2. H/o abnormally elevated LFTs with prior bx c/w fatty liver 3. H/o prior noncompliance  Hospital Course: 46 y/o M with hx of CAD, fatty liver, DM presents to [facility] with complaints of chest pain occurring intermittently over the last several days. It initially occurred at rest on 6/13 after eating dinner associated with nausea and diaphoresis. There was no radiation, dizziness or SOB. Alka-seltzer gave some relief. It lasted a few hours then went away. The following day he experienced shooting pain that went into his shoulder blades/neck lasting all night. He felt fine over the weekend. The day prior to admission, while sitting at work he developed chest burning again that felt like heartburn but he wanted to be checked out given his heart history so presented to the ER. He denied any melena, hematochezia, or sob or chest pain with exertion prior to these episodes. This was not like his prior angina. He was admitted to the hospital for rule-out. He was placed on Coreg and ASA. Lisinopril was continued. Cardiac enzymes were negative x 3.  Amylase and lipase were normal. His LFTs were somewhat elevated but he has a hx of such and his statin was continued. Dr. Riley Kill had a long discussion with the patient this AM. He felt the patient had atypical features and planned for GXT, which was done today and was normal. The patient reached target HR at 8:42 and had no ST-T changes suggestive of ischemia. A 2D echo was also performed as there was discussion for consideration of ICD implantation given prior cardiomyopathy, but EF had actually improved from prior to 45-50%. I discussed these results with the patient and his family. He did bring up that he was not sure if he had been taking Zocor as an outpatient so a handwritten rx was provided to him - he will check at home and if he was not, he will discuss at his f/u visit with Dr. Clifton James as he may need f/u LFTs given hx of elevated transaminases. Dr. Riley Kill has seen and examined the patient today and feels he is stable for discharge.  Discharge Vitals: Blood pressure 115/74, pulse 95, temperature 98.8 F (37.1 C), temperature source Oral, resp. rate 18, height 5\' 10"  (1.778 m), weight 230 lb 9.6 oz (104.6 kg), SpO2 95.00%.  Labs: Lab Results  Component Value Date   WBC 6.1 07/07/2011   HGB 12.8* 07/07/2011   HCT 38.2* 07/07/2011   MCV 85.3 07/07/2011   PLT 225 07/07/2011     Lab 07/07/11 0450 07/06/11 0903  NA 139 --  K  4.3 --  CL 104 --  CO2 25 --  BUN 12 --  CREATININE 0.87 --  CALCIUM 9.3 --  PROT -- 7.4  BILITOT -- 0.7  ALKPHOS -- 103  ALT -- 109*  AST -- 60*  GLUCOSE 174* --    Basename 07/07/11 0004 07/06/11 1742 07/06/11 1127 07/06/11 0903  CKTOTAL 50 52 53 --  CKMB 1.9 1.9 1.9 --  TROPONINI <0.30 <0.30 <0.30 <0.30   Lab Results  Component Value Date   CHOL 110 07/07/2011   HDL 10* 07/07/2011   LDLCALC 28 07/07/2011   TRIG 358* 07/07/2011    Diagnostic Studies/Procedures   1. Chest Port 1 View 07/06/2011  *RADIOLOGY REPORT*  Clinical Data: Chest pain  PORTABLE  CHEST - 1 VIEW  Comparison: 10/06/2010  Findings: Cardiomediastinal silhouette is stable.  No acute infiltrate or pleural effusion.  No pulmonary edema.  Bony thorax is stable.  IMPRESSION: No active disease.  No significant change.  Original Report Authenticated By: Natasha Mead, M.D.   2. 2D echo 07/07/11 Study Conclusions - Left ventricle: Systolic function was mildly reduced. The estimated ejection fraction was in the range of 45% to 50%. - Left atrium: The atrium was mildly dilated.  Discharge Medications   Medication List  As of 07/07/2011  3:52 PM   TAKE these medications         aspirin 81 MG EC tablet   Take 1 tablet (81 mg total) by mouth daily.      carvedilol 6.25 MG tablet   Commonly known as: COREG   Take 1 tablet (6.25 mg total) by mouth 2 (two) times daily with a meal.      glipiZIDE 10 MG tablet   Commonly known as: GLUCOTROL   Take 10 mg by mouth daily.      lisinopril 5 MG tablet   Commonly known as: PRINIVIL,ZESTRIL   Take 5 mg by mouth daily.      metFORMIN 1000 MG tablet   Commonly known as: GLUCOPHAGE   Take 1,000 mg by mouth 2 (two) times daily with a meal.      nitroGLYCERIN 0.4 MG SL tablet   Commonly known as: NITROSTAT   Place 1 tablet (0.4 mg total) under the tongue every 5 (five) minutes as needed for chest pain (up to 3 doses).      OVER THE COUNTER MEDICATION   Take 2 Packages by mouth daily as needed. For cough.heartburn  Alka seltzer.      simvastatin 40 MG tablet   Commonly known as: ZOCOR   Take 40 mg by mouth daily.            Disposition   The patient will be discharged in stable condition to home. Discharge Orders    Future Appointments: Provider: Department: Dept Phone: Center:   07/14/2011 9:45 AM Kathleene Hazel, MD [facility] [phone] [facility]     Future Orders Please Complete By Expires   Diet - low sodium heart healthy      Increase activity slowly      Discharge instructions      Comments:    Please follow up with your primary doctor: - To evaluate for other causes of chest pain if it comes back - To recheck a complete blood count, as you were very mildly anemic. If you develop any blood in your stools, black tarry stools, or any obvious bleeding, please contact your doctor immediately. - To discuss management of your diabetes. Your  hemoglobin A1C was 8.0 which is too high. Goal is usually less than 7. You may require adjustment of your medications. You may also need to discuss alternatives to metformin as this is sometimes avoided in patients with history of abnormal liver function tests.     Follow-up Information    Follow up with [facility], MD. (07/14/11 at 9:45am)    Contact information:   [facility] [address] [place] [address] [phone]       Follow up with Primary Care Doctor. (Please call to set up an appointment with your primary care doctor to evaluate for other causes of your chest pain if it comes back, such as acid reflux disease. You were also very mildly anemic and this will need to be rechecked.)            Duration of Discharge Encounter: Greater than 30 minutes including physician and PA time.  Signed, Ronie Spies PA-C 07/07/2011, 3:52 PM

## 2011-07-07 NOTE — Progress Notes (Signed)
1056-  114/80 p87 1.3cc Definity diluted in 8.7 cc saline 1101 2 cc of Definity soln  injected for echo study 1102 1 cc of Definity soln injected for echo study 1107  98/64 p85

## 2011-07-07 NOTE — Progress Notes (Signed)
Echocardiogram 2D Echocardiogram with Definity has been performed.  Glean Salen [facility] 07/07/2011, 1:20 PM

## 2011-07-07 NOTE — Progress Notes (Signed)
Pt to have stress test today instead of cardiac cath; will obtain consent.

## 2011-07-07 NOTE — Progress Notes (Signed)
PA returned call; will review 2D echo and stress test results with Dr. Riley Kill and will call back; pt informed.

## 2011-07-07 NOTE — Clinical Documentation Improvement (Signed)
DIABETIC  DOCUMENTATION CLARIFICATION QUERY  THIS DOCUMENT IS NOT A PERMANENT PART OF THE MEDICAL RECORD  TO RESPOND TO THE THIS QUERY, FOLLOW THE INSTRUCTIONS BELOW:  1. If needed, update documentation for the patient's encounter via the notes activity.  2. Access this query again and click edit on the In [employer].  3. After updating, or not, click F2 to complete all highlighted (required) fields concerning your review. Select "additional documentation in the medical record" OR "no additional documentation provided".  4. Click Sign note button.  5. The deficiency will fall out of your In Basket *Please let us know if you are not able to complete this workflow by phone or e-mail (listed below).  Please update your documentation within the medical record to reflect your response to this query.                                                                                        07/07/11   Dear Dr.Jadian Karman:/Associates,  In a better effort to capture your patient's severity of illness, reflect appropriate length of stay and utilization of resources, a review of the patient medical record has revealed the following indicators.    Based on your clinical judgment, please clarify and document in a progress note and/or discharge summary the clinical condition associated with the following supporting information:  In responding to this query please exercise your independent judgment.  The fact that a query is asked, does not imply that any particular answer is desired or expected.  Please clarify and specify Diabetes type, control, manifestations, and associated conditions.  Possible Clinical Conditions?                 Diabetes Type 2 _______Controlled or uncontrolled  _______Other Condition  _______Cannot Clinically determine     Supporting Information:  Risk Factors: Noted type II DM, under treated per 6/17 progress notes.  Diagnostics: Lab: 6/17: Hgb A1c: 8.0 Mean plasma  glucose: 183  Treatment: 6/17:  Sliding scale insulin with meals.  You may use possible, probable, or suspect with inpatient documentation. possible, probable, suspected diagnoses MUST be documented at the time of discharge  Reviewed:  no additional documentation provided  Thank You,  Marciano Sequin,  Clinical Documentation Specialist:  Pager: [phone]  Health Information Management [place]   Rx of DM documented in progress notes  Charlton Haws

## 2011-07-07 NOTE — Progress Notes (Signed)
Subjective:  Met with patient and spouse for thirty minutes or more.  He is now taking meds at home.  He does not feel he needs a heart cath.  He was nauseated.  He threw up.  He thought he had indigestion.  Enzymes are negative.  He would prefer a stress test if possible.  We discussed all of this at length.    Objective:  Vital Signs in the last 24 hours: Temp:  [98.5 F (36.9 C)-99 F (37.2 C)] 98.5 F (36.9 C) (06/18 0430) Pulse Rate:  [81-95] 84  (06/18 0430) Resp:  [16-20] 16  (06/18 0430) BP: (95-140)/(63-87) 115/79 mmHg (06/18 0430) SpO2:  [95 %-99 %] 95 % (06/18 0430) Weight:  [230 lb 9.6 oz (104.6 kg)-233 lb 7.5 oz (105.9 kg)] 230 lb 9.6 oz (104.6 kg) (06/18 0430)  Intake/Output from previous day: 06/17 0701 - 06/18 0700 In: 695 [P.O.:480; I.V.:215] Out: -    Physical Exam: General: Well developed, well nourished, in no acute distress. Head:  Normocephalic and atraumatic. Lungs: Clear to auscultation and percussion. Heart: Normal S1 and S2.  No murmur, rubs or gallops.  Pulses: Pulses normal in all 4 extremities. Extremities: No clubbing or cyanosis. No edema. Neurologic: Alert and oriented x 3.    Lab Results:  Basename 07/07/11 0450 07/06/11 1455  WBC 6.1 6.0  HGB 12.8* 12.4*  PLT 225 229    Basename 07/07/11 0450 07/06/11 1455 07/06/11 0903  NA 139 -- 136  K 4.3 -- 4.4  CL 104 -- 100  CO2 25 -- 24  GLUCOSE 174* -- 175*  BUN 12 -- 12  CREATININE 0.87 0.80 --    Basename 07/07/11 0004 07/06/11 1742  TROPONINI <0.30 <0.30   Hepatic Function Panel  Basename 07/06/11 0903  PROT 7.4  ALBUMIN 3.7  AST 60*  ALT 109*  ALKPHOS 103  BILITOT 0.7  BILIDIR --  IBILI --    Basename 07/07/11 0450  CHOL 110   No results found for this basename: PROTIME in the last 72 hours  Imaging: Dg Chest Port 1 View  07/06/2011  *RADIOLOGY REPORT*  Clinical Data: Chest pain  PORTABLE CHEST - 1 VIEW  Comparison: 10/06/2010  Findings: Cardiomediastinal silhouette  is stable.  No acute infiltrate or pleural effusion.  No pulmonary edema.  Bony thorax is stable.  IMPRESSION: No active disease.  No significant change.  Original Report Authenticated By: Natasha Mead, M.D.    EKG:  ASMI, age indeterminate  Cardiac Studies:  Enzymes neg so far  Assessment/Plan:  1.  Chest pain  -  Atypical features.  Neg enzymes.  Patient prefers stress test 2.  Abnl LFTs  -- had bx with fatty liver in 1994 3.  CAD  -- prior anterior MI with stent. 4.  DM  --  Under treatment.    Plan  1.  GXT, if neg, home later today 2.  Long discussion with patient and wife --more than thirty minutes.   3.  Early follow up with Dr. Clifton James.      Shawnie Pons, MD, [facility], FSCAI 07/07/2011, 8:45 AM

## 2011-07-08 MED FILL — Perflutren Lipid Microsphere IV Susp 6.52 MG/ML: INTRAVENOUS | Qty: 2 | Status: AC

## 2011-07-14 ENCOUNTER — Encounter: Payer: Self-pay | Admitting: Cardiovascular Disease

## 2011-07-14 ENCOUNTER — Ambulatory Visit (INDEPENDENT_AMBULATORY_CARE_PROVIDER_SITE_OTHER): Payer: Medicare Other | Admitting: Cardiovascular Disease

## 2011-07-14 VITALS — BP 116/84 | HR 70 | Wt 234.0 lb

## 2011-07-14 DIAGNOSIS — R7989 Other specified abnormal findings of blood chemistry: Secondary | ICD-10-CM

## 2011-07-14 DIAGNOSIS — I2510 Atherosclerotic heart disease of native coronary artery without angina pectoris: Secondary | ICD-10-CM

## 2011-07-14 DIAGNOSIS — I2589 Other forms of chronic ischemic heart disease: Secondary | ICD-10-CM

## 2011-07-14 DIAGNOSIS — I255 Ischemic cardiomyopathy: Secondary | ICD-10-CM

## 2011-07-14 DIAGNOSIS — R945 Abnormal results of liver function studies: Secondary | ICD-10-CM

## 2011-07-14 LAB — HEPATIC FUNCTION PANEL
Bilirubin, Direct: 0 mg/dL (ref 0.0–0.3)
Total Bilirubin: 0.5 mg/dL (ref 0.3–1.2)

## 2011-07-14 NOTE — Assessment & Plan Note (Signed)
Stable post hospitalization with normal stress test last week. Will continue current meds which include ASA, statin, beta blocker, Ace-inh.

## 2011-07-14 NOTE — Assessment & Plan Note (Signed)
LV function improved by recent myoview. Continue medical management.

## 2011-07-14 NOTE — Patient Instructions (Addendum)
Your physician wants you to follow-up in:  6 months. You will receive a reminder letter in the mail two months in advance. If you don't receive a letter, please call our office to schedule the follow-up appointment.   

## 2011-07-14 NOTE — Assessment & Plan Note (Addendum)
Recent LFTs abnormal on statin. Repeat today. If still abnormal, hold statin. He has history of abnormal LFTs before he was on a statin but LFTs completely normal last year. Compliance with statins is spotty.  He tells me he has a history of "fatty liver" in the 1980s.

## 2011-07-14 NOTE — Progress Notes (Signed)
History of Present Illness: 46 yo WM with history of CAD with anterior STEMI February 2010 with drug eluting stent placed in the proximal LAD, DM, hyperlipidemia here today for cardiac follow up. He was admitted to [facility] March 20, 2010 with chest pain. He ruled out for an MI with serial cardiac enzymes. Outpatient myoview was arranged. He could not get his heart rate up with exercise so Lexiscan was used. This showed scar in the anterior wall with mild ischemia and EF of 33%. His post STEMI EF leaving the hospital was 35% by echo in February 2010. I saw him in April 2012. He had not been taking any of his medications. He had stopped all of the cardiac meds and diabetic meds. He had not seen a primary care physician. He was seen again in February 2013 and had run out of all meds. He was admitted to [facility] June 17,2013 with chest pain. He ruled out for MI with serial enzymes. EKG did not show ischemic changes. He had an exercise stress test that showed no ischemia and echo on 07/07/11 showed improvement of his LV function to 45-50%.    He has not had chest pain since leaving the hospital last week. No SOB. No complaints today. He is a non-smoker. He has a history of "fatty liver" in the 1980s. He has had recent abnormal LFTs but not clear if this is related to his statin as he is grossly non-compliant with his statin. No follow up with GI specialist for liver disease in "years".   Primary Care Physician: Samuel Jester in [place] [facility]  Last Lipid Profile:  Lipid Panel     Component Value Date/Time   CHOL 110 07/07/2011 0450   TRIG 358* 07/07/2011 0450   HDL 10* 07/07/2011 0450   CHOLHDL 11.0 07/07/2011 0450   VLDL 72* 07/07/2011 0450   LDLCALC 28 07/07/2011 0450     Past Medical History  Diagnosis Date  . Coronary artery disease     a. 02/2008: anterior STEMI s/p DES to prox LAD, b. Myoview 2012: scar in the anterior wall with mild ischemia and EF 33%, c. neg GXT 06/2011  . Diabetes  mellitus     TYPE II  . Hyperlipidemia   . OSA (obstructive sleep apnea)   . Fatty liver     H/O ELEVATED LIVER ENZYMES  . Arthritis     in lower back  . Ischemic cardiomyopathy     a. Previous EF 33-35% 2012, b. improved to 45-50% by echo 06/2011  . Noncompliance     Prior hx of med noncompliance    Past Surgical History  Procedure Date  . Nasal sinus surgery   . Liver biopsy     Current Outpatient Prescriptions  Medication Sig Dispense Refill  . aspirin EC 81 MG EC tablet Take 1 tablet (81 mg total) by mouth daily.      . carvedilol (COREG) 6.25 MG tablet Take 1 tablet (6.25 mg total) by mouth 2 (two) times daily with a meal.  60 tablet  6  . glipiZIDE (GLUCOTROL) 10 MG tablet Take 10 mg by mouth daily.      Marland Kitchen lisinopril (PRINIVIL,ZESTRIL) 5 MG tablet Take 5 mg by mouth daily.      . metFORMIN (GLUCOPHAGE) 1000 MG tablet Take 1,000 mg by mouth 2 (two) times daily with a meal.      . nitroGLYCERIN (NITROSTAT) 0.4 MG SL tablet Place 1 tablet (0.4 mg total) under the  tongue every 5 (five) minutes as needed for chest pain (up to 3 doses).  25 tablet  4  . OVER THE COUNTER MEDICATION Take 2 Packages by mouth daily as needed. For cough.heartburn Alka seltzer.      . simvastatin (ZOCOR) 40 MG tablet Take 40 mg by mouth daily.        No Known Allergies  History   Social History  . Marital Status: Married    Spouse Name: N/A    Number of Children: N/A  . Years of Education: N/A   Occupational History  . Not on file.   Social History Main Topics  . Smoking status: Never Smoker   . Smokeless tobacco: Never Used  . Alcohol Use: No  . Drug Use: No  . Sexually Active: Yes   Other Topics Concern  . Not on file   Social History Narrative  . No narrative on file    Family History  Problem Relation Age of Onset  . Coronary artery disease      UNKNOWN  . Heart disease Father     CAD s/p CABG    Review of Systems:  As stated in the HPI and otherwise negative.   BP  116/84  Pulse 70  Wt 234 lb (106.142 kg)  Physical Examination: General: Well developed, well nourished, NAD HEENT: OP clear, mucus membranes moist SKIN: warm, dry. No rashes. Neuro: No focal deficits Musculoskeletal: Muscle strength 5/5 all ext Psychiatric: Mood and affect normal Neck: No JVD, no carotid bruits, no thyromegaly, no lymphadenopathy. Lungs:Clear bilaterally, no wheezes, rhonci, crackles Cardiovascular: Regular rate and rhythm. No murmurs, gallops or rubs. Abdomen:Soft. Bowel sounds present. Non-tender.  Extremities: No lower extremity edema. Pulses are 2 + in the bilateral DP/PT.  Echo 07/07/11:  Left ventricle: Systolic function was mildly reduced. The estimated ejection fraction was in the range of 45% to 50%. - Left atrium: The atrium was mildly dilated.

## 2011-07-15 ENCOUNTER — Telehealth: Payer: Self-pay | Admitting: Cardiovascular Disease

## 2011-07-15 ENCOUNTER — Other Ambulatory Visit: Payer: Self-pay | Admitting: *Deleted

## 2011-07-15 DIAGNOSIS — I10 Essential (primary) hypertension: Secondary | ICD-10-CM

## 2011-07-15 DIAGNOSIS — R945 Abnormal results of liver function studies: Secondary | ICD-10-CM

## 2011-07-15 DIAGNOSIS — R7989 Other specified abnormal findings of blood chemistry: Secondary | ICD-10-CM

## 2011-07-15 NOTE — Telephone Encounter (Signed)
Follow up on previous call:  Patient calling stating someone called him today.

## 2011-07-15 NOTE — Telephone Encounter (Signed)
Patient aware of lab results and MD's recommendations. Patient has an appointment lab work  on July 26 th for  LFT. Patient aware.

## 2011-07-22 ENCOUNTER — Encounter: Payer: Medicare Other | Admitting: Cardiovascular Disease

## 2011-08-14 ENCOUNTER — Other Ambulatory Visit: Payer: Medicare Other

## 2012-04-25 ENCOUNTER — Other Ambulatory Visit: Payer: Self-pay | Admitting: Cardiovascular Disease

## 2012-09-30 ENCOUNTER — Other Ambulatory Visit: Payer: Self-pay | Admitting: Cardiovascular Disease

## 2012-10-07 ENCOUNTER — Encounter: Payer: Self-pay | Admitting: *Deleted

## 2012-10-12 ENCOUNTER — Encounter: Payer: Medicare Other | Admitting: Cardiovascular Disease

## 2012-10-12 NOTE — Progress Notes (Signed)
No show

## 2012-10-13 ENCOUNTER — Encounter: Payer: Self-pay | Admitting: Cardiovascular Disease

## 2013-02-23 ENCOUNTER — Encounter: Payer: Self-pay | Admitting: Cardiovascular Disease

## 2013-03-02 ENCOUNTER — Ambulatory Visit: Payer: Medicare Other | Admitting: Cardiovascular Disease

## 2013-03-07 ENCOUNTER — Ambulatory Visit: Payer: Medicare Other | Admitting: Cardiovascular Disease

## 2013-03-10 ENCOUNTER — Encounter: Payer: Self-pay | Admitting: Cardiovascular Disease

## 2016-08-06 ENCOUNTER — Encounter: Payer: Self-pay | Admitting: *Deleted

## 2016-08-06 ENCOUNTER — Encounter: Payer: Self-pay | Admitting: Cardiology

## 2016-08-06 ENCOUNTER — Ambulatory Visit (INDEPENDENT_AMBULATORY_CARE_PROVIDER_SITE_OTHER): Payer: 59 | Admitting: Cardiology

## 2016-08-06 VITALS — BP 145/91 | HR 83 | Ht 70.0 in | Wt 236.0 lb

## 2016-08-06 DIAGNOSIS — R079 Chest pain, unspecified: Secondary | ICD-10-CM | POA: Diagnosis not present

## 2016-08-06 DIAGNOSIS — I10 Essential (primary) hypertension: Secondary | ICD-10-CM

## 2016-08-06 DIAGNOSIS — E119 Type 2 diabetes mellitus without complications: Secondary | ICD-10-CM | POA: Diagnosis not present

## 2016-08-06 DIAGNOSIS — I2510 Atherosclerotic heart disease of native coronary artery without angina pectoris: Secondary | ICD-10-CM

## 2016-08-06 DIAGNOSIS — E782 Mixed hyperlipidemia: Secondary | ICD-10-CM

## 2016-08-06 MED ORDER — ATORVASTATIN CALCIUM 80 MG PO TABS: 80.0000 mg | ORAL_TABLET | Freq: Every day | ORAL | 1 refills | Status: DC

## 2016-08-06 MED ORDER — CARVEDILOL 3.125 MG PO TABS: 3.1250 mg | ORAL_TABLET | Freq: Two times a day (BID) | ORAL | 1 refills | Status: DC

## 2016-08-06 NOTE — Progress Notes (Signed)
Clinical Summary [name] [name] is a 50 y.o.male last seen by Dr Clifton James in 2013, this is our first visit together. He is seen as a new consult, referred by Dr Charm Barges for CAD and chest pain  1. CAD - history of anterior STEMI 02/2008, received DES to LAD. LVEF at that time was 35% - 2012 myoview anterior scar with mild ischemia - 06/2011 echo LVEF 45-50% - 06/2011 GXT no ischemia  - recent chest pains on and off x 2-3 years. - pressure like pain midchest, 6-7/10. Can occur at rest or with activity, worst with activity. Increasing DOE. Occasional LE edema - compliant with meds. Not on ASA for unclear reasons, he stopped statin. Ran out of beta blocker - seen in [place] ER with chest pain a few weeks ago. Records not available at this time.  - symptoms typically come on with emotional stress or physical activity. Pain lasts several minutes at time. Not better with antacids - increase in frequency, stable severity  2. DM2 - followed by pcp - from cardiac standpoint he is on ACE-I, not currently on ASA   3. Hyperlipidemia - he is no longer taking his statin, he denies any significant side effects   Past Medical History:  Diagnosis Date  . Angina effort (HCC)   . Arthritis    in lower back  . Coronary artery disease    a. 02/2008: anterior STEMI s/p DES to prox LAD, b. Myoview 2012: scar in the anterior wall with mild ischemia and EF 33%, c. neg GXT 06/2011  . Depression with anxiety   . Diabetes mellitus    TYPE II  . Fatty liver    H/O ELEVATED LIVER ENZYMES  . Hyperlipidemia   . Ischemic cardiomyopathy    a. Previous EF 33-35% 2012, b. improved to 45-50% by echo 06/2011  . Noncompliance    Prior hx of med noncompliance  . OSA (obstructive sleep apnea)      No Known Allergies   Current Outpatient Prescriptions  Medication Sig Dispense Refill  . Ertugliflozin L-PyroglutamicAc (STEGLATRO) 5 MG TABS Take by mouth.    . liraglutide (VICTOZA) 18 MG/3ML SOPN Inject into the  skin.    Marland Kitchen lisinopril (PRINIVIL,ZESTRIL) 20 MG tablet Take 20 mg by mouth daily.    . metFORMIN (GLUCOPHAGE) 1000 MG tablet Take 1,000 mg by mouth 2 (two) times daily with a meal.    . nitroGLYCERIN (NITROSTAT) 0.4 MG SL tablet Place 0.4 mg under the tongue every 5 (five) minutes as needed for chest pain.    . sildenafil (REVATIO) 20 MG tablet Take 20 mg by mouth 3 (three) times daily.    Marland Kitchen topiramate (TOPAMAX) 25 MG capsule Take 25 mg by mouth 2 (two) times daily.     No current facility-administered medications for this visit.      Past Surgical History:  Procedure Laterality Date  . LIVER BIOPSY    . MI WITH STENTS    . NASAL SINUS SURGERY       No Known Allergies    Family History  Problem Relation Age of Onset  . Coronary artery disease Unknown        UNKNOWN  . Heart disease Father        CAD s/p CABG  . Diabetes Mellitus II Father   . Diabetes Mellitus II Mother   . Cancer Brother      Social History [name] [name] reports that he has never smoked. He has never  used smokeless tobacco. [name] [name] reports that he does not drink alcohol.   Review of Systems CONSTITUTIONAL: No weight loss, fever, chills, weakness or fatigue.  HEENT: Eyes: No visual loss, blurred vision, double vision or yellow sclerae.No hearing loss, sneezing, congestion, runny nose or sore throat.  SKIN: No rash or itching.  CARDIOVASCULAR: per hpi RESPIRATORY: per hpi GASTROINTESTINAL: No anorexia, nausea, vomiting or diarrhea. No abdominal pain or blood.  GENITOURINARY: No burning on urination, no polyuria NEUROLOGICAL: No headache, dizziness, syncope, paralysis, ataxia, numbness or tingling in the extremities. No change in bowel or bladder control.  MUSCULOSKELETAL: No muscle, back pain, joint pain or stiffness.  LYMPHATICS: No enlarged nodes. No history of splenectomy.  PSYCHIATRIC: No history of depression or anxiety.  ENDOCRINOLOGIC: No reports of sweating, cold or heat intolerance. No polyuria or  polydipsia.  Marland Kitchen   Physical Examination Vitals:   08/06/16 0830 08/06/16 0833  BP: (!) 136/91 (!) 145/91  Pulse: 83 83   Vitals:   08/06/16 0830  Weight: 236 lb (107 kg)  Height: 5\' 10"  (1.778 m)    Gen: resting comfortably, no acute distress HEENT: no scleral icterus, pupils equal round and reactive, no palptable cervical adenopathy,  CV: RRR, no m/r/g, no jvd Resp: Clear to auscultation bilaterally GI: abdomen is soft, non-tender, non-distended, normal bowel sounds, no hepatosplenomegaly MSK: extremities are warm, no edema.  Skin: warm, no rash Neuro:  no focal deficits Psych: appropriate affect   Diagnostic Studies 6./2013 echo Study Conclusions  - Left ventricle: Systolic function was mildly reduced. The estimated ejection fraction was in the range of 45% to 50%. - Left atrium: The atrium was mildly dilated.    Assessment and Plan  1. CAD - recent symptoms as descriebd above - EKG in clinic shows SR, nonspecific ST/T changes - we will obtain echo, pending results likely obtain lexiscan MPI - start ASA 81mg  daily, restart statin  2. Hyperlipidemia - restart statin with atorva 80mg  daily  3. DM2 - glycemic control per pcp - continue ACE-I, start asa and statin  F/u pendings results. Note given to excuse patient from work x 2 weeks     Antoine Poche, M.D.

## 2016-08-06 NOTE — Patient Instructions (Signed)
Your physician recommends that you schedule a follow-up appointment in: TO BE DETERMINED AFTER TESTING WITH DR [facility]   Your physician has recommended you make the following change in your medication:   START ASPIRIN 81 MG DAILY  START COREG 3.125 MG TWICE DAILY  START ATORVASTATIN 80 MG DAILY  Your physician recommends that you return for lab work CBC/BMP/TSH/HGBA1C/MG/LIPIDS - PLEASE FAST 6-8 HOURS PRIOR TO LAB WORK  Your physician has requested that you have an echocardiogram. Echocardiography is a painless test that uses sound waves to create images of your heart. It provides your doctor with information about the size and shape of your heart and how well your heart's chambers and valves are working. This procedure takes approximately one hour. There are no restrictions for this procedure.  WE HAVE GIVEN YOU A WORK NOTE FOR 2 WEEKS  Thank you for choosing [facility]!!

## 2016-08-07 ENCOUNTER — Other Ambulatory Visit (HOSPITAL_COMMUNITY)
Admission: RE | Admit: 2016-08-07 | Discharge: 2016-08-07 | Disposition: A | Discharge status: Home / Self Care | Payer: 59 | Source: Ambulatory Visit | Attending: Cardiology | Admitting: Cardiology

## 2016-08-07 ENCOUNTER — Ambulatory Visit (HOSPITAL_COMMUNITY)
Admission: RE | Admit: 2016-08-07 | Discharge: 2016-08-07 | Disposition: A | Discharge status: Home / Self Care | Payer: 59 | Source: Ambulatory Visit | Attending: Cardiology | Admitting: Cardiology

## 2016-08-07 DIAGNOSIS — E782 Mixed hyperlipidemia: Secondary | ICD-10-CM | POA: Diagnosis present

## 2016-08-07 DIAGNOSIS — I501 Left ventricular failure: Secondary | ICD-10-CM | POA: Diagnosis not present

## 2016-08-07 DIAGNOSIS — I051 Rheumatic mitral insufficiency: Secondary | ICD-10-CM | POA: Diagnosis not present

## 2016-08-07 DIAGNOSIS — I5031 Acute diastolic (congestive) heart failure: Secondary | ICD-10-CM | POA: Insufficient documentation

## 2016-08-07 DIAGNOSIS — R079 Chest pain, unspecified: Secondary | ICD-10-CM | POA: Insufficient documentation

## 2016-08-07 DIAGNOSIS — I420 Dilated cardiomyopathy: Secondary | ICD-10-CM | POA: Diagnosis not present

## 2016-08-07 LAB — CBC
HEMATOCRIT: 46.5 % (ref 39.0–52.0)
Hemoglobin: 16 g/dL (ref 13.0–17.0)
MCH: 29.4 pg (ref 26.0–34.0)
MCHC: 34.4 g/dL (ref 30.0–36.0)
MCV: 85.5 fL (ref 78.0–100.0)
PLATELETS: 279 10*3/uL (ref 150–400)
RBC: 5.44 MIL/uL (ref 4.22–5.81)
RDW: 12.7 % (ref 11.5–15.5)
WBC: 6 10*3/uL (ref 4.0–10.5)

## 2016-08-07 LAB — BASIC METABOLIC PANEL
ANION GAP: 7 (ref 5–15)
BUN: 13 mg/dL (ref 6–20)
CO2: 25 mmol/L (ref 22–32)
CREATININE: 1.1 mg/dL (ref 0.61–1.24)
Calcium: 9.6 mg/dL (ref 8.9–10.3)
Chloride: 107 mmol/L (ref 101–111)
GFR calc Af Amer: >60 mL/min (ref >60)
GFR calc non Af Amer: >60 mL/min (ref >60)
Glucose, Bld: 194 mg/dL — ABNORMAL HIGH (ref 65–99)
Potassium: 4.6 mmol/L (ref 3.5–5.1)
Sodium: 139 mmol/L (ref 135–145)

## 2016-08-07 LAB — ECHOCARDIOGRAM COMPLETE
AVLVOTPG: 4 mmHg
E decel time: 194 msec
E/e' ratio: 16.31
FS: 18 % — AB (ref 28–44)
IVS/LV PW RATIO, ED: 0.72
LA ID, A-P, ES: 40 mm
LA diam end sys: 40 mm
LA diam index: 1.71 cm/m2
LA vol index: 29 mL/m2
LAVOL: 67.7 mL
LAVOLA4C: 66.7 mL
LDCA: 3.8 cm2
LV SIMPSON'S DISK: 44
LV TDI E'MEDIAL: 5.85
LV dias vol index: 72 mL/m2
LV dias vol: 169 mL — AB (ref 62–150)
LV e' LATERAL: 5.93 cm/s
LV sys vol index: 40 mL/m2
LVEEAVG: 16.31
LVEEMED: 16.31
LVOT SV: 65 mL
LVOT VTI: 17.2 cm
LVOT diameter: 22 mm
LVOT peak vel: 94 cm/s
LVSYSVOL: 94 mL — AB (ref 21–61)
MV Dec: 194
MV Peak grad: 4 mmHg
MV pk E vel: 96.7 m/s
MVPKAVEL: 39.4 m/s
PW: 12.9 mm — AB (ref 0.6–1.1)
RV LATERAL S' VELOCITY: 10.6 cm/s
Stroke v: 75 ml
TAPSE: 19.8 mm
TDI e' lateral: 5.93

## 2016-08-07 LAB — MAGNESIUM: Magnesium: 2 mg/dL (ref 1.7–2.4)

## 2016-08-07 LAB — LIPID PANEL
CHOLESTEROL: 216 mg/dL — AB (ref 0–200)
HDL: 34 mg/dL — ABNORMAL LOW (ref >40)
LDL Cholesterol: 158 mg/dL — ABNORMAL HIGH (ref 0–99)
TRIGLYCERIDES: 118 mg/dL (ref <150)
Total CHOL/HDL Ratio: 6.4 RATIO
VLDL: 24 mg/dL (ref 0–40)

## 2016-08-07 LAB — TSH: TSH: 2.09 u[IU]/mL (ref 0.350–4.500)

## 2016-08-07 NOTE — Progress Notes (Signed)
*  PRELIMINARY RESULTS* Echocardiogram 2D Echocardiogram has been performed.  Stacey Drain 08/07/2016, 1:44 PM

## 2016-08-08 LAB — HEMOGLOBIN A1C
Hgb A1c MFr Bld: 8.1 % — ABNORMAL HIGH (ref 4.8–5.6)
MEAN PLASMA GLUCOSE: 186 mg/dL

## 2016-08-10 ENCOUNTER — Telehealth: Payer: Self-pay

## 2016-08-10 ENCOUNTER — Telehealth: Payer: Self-pay | Admitting: Cardiology

## 2016-08-10 NOTE — Telephone Encounter (Signed)
Please see result note   J Venesa Semidey MD 

## 2016-08-10 NOTE — Telephone Encounter (Signed)
Patients mother notified. Routed to PCP

## 2016-08-10 NOTE — Telephone Encounter (Signed)
Patient notified. Routed to PCP 

## 2016-08-10 NOTE — Telephone Encounter (Signed)
Patient called requesting lab results from last week.  Please call [phone].

## 2016-08-11 ENCOUNTER — Other Ambulatory Visit: Payer: Self-pay

## 2016-08-11 DIAGNOSIS — I2510 Atherosclerotic heart disease of native coronary artery without angina pectoris: Secondary | ICD-10-CM

## 2016-08-11 DIAGNOSIS — I5031 Acute diastolic (congestive) heart failure: Secondary | ICD-10-CM

## 2016-08-11 DIAGNOSIS — I255 Ischemic cardiomyopathy: Secondary | ICD-10-CM

## 2016-08-14 ENCOUNTER — Ambulatory Visit (HOSPITAL_COMMUNITY)
Admission: RE | Admit: 2016-08-14 | Discharge: 2016-08-14 | Disposition: A | Discharge status: Home / Self Care | Payer: 59 | Source: Ambulatory Visit | Attending: Cardiology | Admitting: Cardiology

## 2016-08-14 ENCOUNTER — Telehealth: Payer: Self-pay | Admitting: *Deleted

## 2016-08-14 DIAGNOSIS — I5031 Acute diastolic (congestive) heart failure: Secondary | ICD-10-CM | POA: Insufficient documentation

## 2016-08-14 DIAGNOSIS — I255 Ischemic cardiomyopathy: Secondary | ICD-10-CM | POA: Diagnosis not present

## 2016-08-14 DIAGNOSIS — I252 Old myocardial infarction: Secondary | ICD-10-CM | POA: Insufficient documentation

## 2016-08-14 DIAGNOSIS — E785 Hyperlipidemia, unspecified: Secondary | ICD-10-CM | POA: Insufficient documentation

## 2016-08-14 DIAGNOSIS — I110 Hypertensive heart disease with heart failure: Secondary | ICD-10-CM | POA: Diagnosis not present

## 2016-08-14 DIAGNOSIS — I2510 Atherosclerotic heart disease of native coronary artery without angina pectoris: Secondary | ICD-10-CM | POA: Diagnosis not present

## 2016-08-14 HISTORY — DX: Ischemic cardiomyopathy: I25.5

## 2016-08-14 MED ORDER — PERFLUTREN LIPID MICROSPHERE
1.0000 mL | INTRAVENOUS | Status: AC | PRN
  Administered 2016-08-14 (×2): 1 mL via INTRAVENOUS
  Filled 2016-08-14: qty 10

## 2016-08-14 NOTE — Telephone Encounter (Signed)
Pt requesting results of echo - explained that we would forward message to Dr Wyline Mood but that may not get resulted on today - pt voiced understanding

## 2016-08-14 NOTE — Progress Notes (Signed)
*  PRELIMINARY RESULTS* Echocardiogram Limited 2 D Echocardiogram has been performed with Definity.  Stacey Drain 08/14/2016, 10:30 AM

## 2016-08-17 NOTE — Telephone Encounter (Signed)
Pt voiced understanding and will come for appt tomorrow - routed to schedulers to add

## 2016-08-17 NOTE — Telephone Encounter (Signed)
Echo shows some weakness in the pumping function of his heart. Id like to discuss in details, is he available to see me in clinic tomorrow at 220pm in [facility]  [name] MD

## 2016-08-18 ENCOUNTER — Ambulatory Visit (INDEPENDENT_AMBULATORY_CARE_PROVIDER_SITE_OTHER): Payer: 59 | Admitting: Cardiology

## 2016-08-18 ENCOUNTER — Encounter: Payer: Self-pay | Admitting: Cardiology

## 2016-08-18 ENCOUNTER — Other Ambulatory Visit (HOSPITAL_COMMUNITY)
Admission: RE | Admit: 2016-08-18 | Discharge: 2016-08-18 | Disposition: A | Discharge status: Home / Self Care | Payer: 59 | Source: Ambulatory Visit | Attending: Cardiology | Admitting: Cardiology

## 2016-08-18 VITALS — BP 134/88 | HR 83 | Ht 70.0 in | Wt 235.0 lb

## 2016-08-18 DIAGNOSIS — I5021 Acute systolic (congestive) heart failure: Secondary | ICD-10-CM

## 2016-08-18 DIAGNOSIS — R079 Chest pain, unspecified: Secondary | ICD-10-CM

## 2016-08-18 DIAGNOSIS — Z01818 Encounter for other preprocedural examination: Secondary | ICD-10-CM | POA: Diagnosis not present

## 2016-08-18 DIAGNOSIS — I2510 Atherosclerotic heart disease of native coronary artery without angina pectoris: Secondary | ICD-10-CM

## 2016-08-18 LAB — BASIC METABOLIC PANEL
Anion gap: 7 (ref 5–15)
BUN: 12 mg/dL (ref 6–20)
CALCIUM: 10 mg/dL (ref 8.9–10.3)
CHLORIDE: 109 mmol/L (ref 101–111)
CO2: 26 mmol/L (ref 22–32)
CREATININE: 0.93 mg/dL (ref 0.61–1.24)
GFR calc Af Amer: >60 mL/min (ref >60)
GFR calc non Af Amer: >60 mL/min (ref >60)
Glucose, Bld: 176 mg/dL — ABNORMAL HIGH (ref 65–99)
Potassium: 4.2 mmol/L (ref 3.5–5.1)
Sodium: 142 mmol/L (ref 135–145)

## 2016-08-18 LAB — CBC WITH DIFFERENTIAL/PLATELET
Basophils Absolute: 0 10*3/uL (ref 0.0–0.1)
Basophils Relative: 1 %
EOS PCT: 1 %
Eosinophils Absolute: 0.1 10*3/uL (ref 0.0–0.7)
HEMATOCRIT: 46.7 % (ref 39.0–52.0)
Hemoglobin: 15.8 g/dL (ref 13.0–17.0)
LYMPHS PCT: 32 %
Lymphs Abs: 2.6 10*3/uL (ref 0.7–4.0)
MCH: 29 pg (ref 26.0–34.0)
MCHC: 33.8 g/dL (ref 30.0–36.0)
MCV: 85.8 fL (ref 78.0–100.0)
MONO ABS: 0.6 10*3/uL (ref 0.1–1.0)
MONOS PCT: 8 %
NEUTROS ABS: 4.8 10*3/uL (ref 1.7–7.7)
Neutrophils Relative %: 58 %
PLATELETS: 293 10*3/uL (ref 150–400)
RBC: 5.44 MIL/uL (ref 4.22–5.81)
RDW: 12.6 % (ref 11.5–15.5)
WBC: 8.1 10*3/uL (ref 4.0–10.5)

## 2016-08-18 LAB — PROTIME-INR
INR: 0.94
Prothrombin Time: 12.6 seconds (ref 11.4–15.2)

## 2016-08-18 MED ORDER — CARVEDILOL 6.25 MG PO TABS: 6.2500 mg | ORAL_TABLET | Freq: Two times a day (BID) | ORAL | 3 refills | Status: DC

## 2016-08-18 NOTE — Progress Notes (Signed)
Clinical Summary [name] [name] is a 50 y.o.male seen today for follow up of the following medical problems. This is a focused visit on his history of CAD and new diagnosis of ICM.   1. CAD - history of anterior STEMI 02/2008, received DES to LAD. LVEF at that time was 35% - 2012 myoview anterior scar with mild ischemia - 06/2011 echo LVEF 45-50% - 06/2011 GXT no ischemia  - recent chest pains on and off x 2-3 years. - pressure like pain midchest, 6-7/10. Can occur at rest or with activity, worst with activity. Increasing DOE. Occasional LE edema - compliant with meds. Not on ASA for unclear reasons, he stopped statin. Ran out of beta blocker - seen in [place] ER with chest pain a few weeks ago. Records not available at this time.  - symptoms typically come on with emotional stress or physical activity. Pain lasts several minutes at time. Not better with antacids - increase in frequency, stable severity   - 07/2016 echo shows LVEF down to 30-35%, diffuse hypokinesis with akinesis of anerior and anteroseptal walls. Evidence of LV thrombus - still with chest pain at times. Stable SOB/dOE, worst in hot weather.    2. 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  Past Medical History:  Diagnosis Date  . Angina effort (HCC)   . Arthritis    in lower back  . Coronary artery disease    a. 02/2008: anterior STEMI s/p DES to prox LAD, b. Myoview 2012: scar in the anterior wall with mild ischemia and EF 33%, c. neg GXT 06/2011  . Depression with anxiety   . Diabetes mellitus    TYPE II  . Fatty liver    H/O ELEVATED LIVER ENZYMES  . Hyperlipidemia   . Ischemic cardiomyopathy    a. Previous EF 33-35% 2012, b. improved to 45-50% by echo 06/2011  . Noncompliance    Prior hx of med noncompliance  . OSA (obstructive sleep apnea)      No Known Allergies   Current Outpatient Prescriptions  Medication Sig Dispense Refill  . aspirin EC 81 MG tablet Take 81 mg by  mouth daily.    Marland Kitchen atorvastatin (LIPITOR) 80 MG tablet Take 1 tablet (80 mg total) by mouth daily. 90 tablet 1  . carvedilol (COREG) 3.125 MG tablet Take 1 tablet (3.125 mg total) by mouth 2 (two) times daily. 180 tablet 1  . Ertugliflozin L-PyroglutamicAc (STEGLATRO) 5 MG TABS Take by mouth.    . liraglutide (VICTOZA) 18 MG/3ML SOPN Inject into the skin.    Marland Kitchen lisinopril (PRINIVIL,ZESTRIL) 20 MG tablet Take 20 mg by mouth daily.    . metFORMIN (GLUCOPHAGE) 1000 MG tablet Take 1,000 mg by mouth 2 (two) times daily with a meal.    . nitroGLYCERIN (NITROSTAT) 0.4 MG SL tablet Place 0.4 mg under the tongue every 5 (five) minutes as needed for chest pain.    . sildenafil (REVATIO) 20 MG tablet Take 20 mg by mouth 3 (three) times daily.    Marland Kitchen topiramate (TOPAMAX) 25 MG capsule Take 25 mg by mouth 2 (two) times daily.     No current facility-administered medications for this visit.      Past Surgical History:  Procedure Laterality Date  . LIVER BIOPSY    . MI WITH STENTS    . NASAL SINUS SURGERY       No Known Allergies    Family History  Problem Relation Age of Onset  . Coronary artery disease Unknown        UNKNOWN  . Heart disease Father        CAD s/p CABG  . Diabetes Mellitus II Father   . Diabetes Mellitus II Mother   . Cancer Brother      Social History [name] reports that he has never smoked. He has never used smokeless tobacco. [name] reports that he does not drink alcohol.   Review of Systems CONSTITUTIONAL: No weight loss, fever, chills, weakness or fatigue.  HEENT: Eyes: No visual loss, blurred vision, double vision or yellow sclerae.No hearing loss, sneezing, congestion, runny nose or sore throat.  SKIN: No rash or itching.  CARDIOVASCULAR: per hpi RESPIRATORY: per hpi GASTROINTESTINAL: No anorexia, nausea, vomiting or diarrhea. No abdominal pain or blood.  GENITOURINARY: No burning on urination, no polyuria NEUROLOGICAL: No headache, dizziness, syncope,  paralysis, ataxia, numbness or tingling in the extremities. No change in bowel or bladder control.  MUSCULOSKELETAL: No muscle, back pain, joint pain or stiffness.  LYMPHATICS: No enlarged nodes. No history of splenectomy.  PSYCHIATRIC: No history of depression or anxiety.  ENDOCRINOLOGIC: No reports of sweating, cold or heat intolerance. No polyuria or polydipsia.  Marland Kitchen   Physical Examination Vitals:   08/18/16 1351  BP: 134/88  Pulse: 83   Vitals:   08/18/16 1351  Weight: 235 lb (106.6 kg)  Height: 5\' 10"  (1.778 m)    Gen: resting comfortably, no acute distress HEENT: no scleral icterus, pupils equal round and reactive, no palptable cervical adenopathy,  CV: RRR, no m/r/g, no jvd Resp: Clear to auscultation bilaterally GI: abdomen is soft, non-tender, non-distended, normal bowel sounds, no hepatosplenomegaly MSK: extremities are warm, no edema.  Skin: warm, no rash Neuro:  no focal deficits Psych: appropriate affect   Diagnostic Studies 6./2013 echo Study Conclusions  - Left ventricle: Systolic function was mildly reduced. The estimated ejection fraction was in the range of 45% to 50%. - Left atrium: The atrium was mildly dilated.  08/07/16 echo Study Conclusions  - Left ventricle: LV systolic function is depressed at   approximately 30 to 35% with severe hypokinesis/akinesis of the   inferior, septal, distal lateral and apical walls. Cannot exclude   mural thrombus at apex. Suggest limited echo with Definity to   define The cavity size was normal. Wall thickness was normal. - Mitral valve: There was mild regurgitation. - Left atrium: The atrium was mildly dilated.  08/14/16 echo Study Conclusions  - Procedure narrative: Transthoracic echocardiography. Image   quality was adequate. The study was technically difficult.   Intravenous contrast (Definity) was administered. - Left ventricle: Systolic function was moderately to severely   reduced. The estimated  ejection fraction was in the range of 30%   to 35%. Diffuse hypokinesis. There is akinesis of the   anteroseptal, anterior, and apical myocardium. - Right ventricle: Systolic function was normal.  Impressions:  - Limited study with Definity contrast. LVEF is in the range of   30-35%. There is a loosely organized LV apical thrombus noted.   Diffuse hypokinesis overall with akinesis of the anterior,   anteroseptal, and apical myocardium. RV contraction normal based   on limited views.   Assessment and Plan  1. CAD/Acute systolic HF - recent symptoms as descriebd above combined with new diagnosis of systolic HF with LVEF 30-35%.  -we will plan for LHC/RHC - continue coreg and lisinopril, work to titrate over follow ups.  2. LV thrombus - noted on recent echo - will start coumadin, he is for cath this week, start coumadin after procedure, arrange appt in coumadin clinic early next week        Antoine Poche, M.D

## 2016-08-18 NOTE — Patient Instructions (Signed)
Medication Instructions:  INCREASE COREG TO 6.25 MG TWO TIMES DAILY   Labwork: TODAY   Testing/Procedures: Your physician has requested that you have a cardiac catheterization. Cardiac catheterization is used to diagnose and/or treat various heart conditions. Doctors may recommend this procedure for a number of different reasons. The most common reason is to evaluate chest pain. Chest pain can be a symptom of coronary artery disease (CAD), and cardiac catheterization can show whether plaque is narrowing or blocking your heart's arteries. This procedure is also used to evaluate the valves, as well as measure the blood flow and oxygen levels in different parts of your heart. For further information please visit [email or web address] Please follow instruction sheet, as given.    Follow-Up: Your physician recommends that you schedule a follow-up appointment in: 3 WEEKS    Any Other Special Instructions Will Be Listed Below (If Applicable).     If you need a refill on your cardiac medications before your next appointment, please call your pharmacy.    [facility] CARDIOVASCULAR DIVISION [facility] [address] [place] [address] Dept: [phone] Loc: [phone]  XAVI BRICH  08/18/2016  You are scheduled for a Cardiac Catheterization on Friday, August 3 with Dr. Verne Carrow.  1. Please arrive at the [facility] (Main Entrance A) at [facility]: [address] [place], [place] [address] at 11:30 AM (two hours before your procedure to ensure your preparation). Free valet parking service is available.   Special note: Every effort is made to have your procedure done on time. Please understand that emergencies sometimes delay scheduled procedures.  2. Diet: Do not eat or drink anything after midnight prior to your procedure except sips of water to take medications.  3. Labs: TODAY    4. Medication instructions in  preparation for your procedure:     Stop taking, Glucophage (Metformin) on Thursday, August 2.  Take only 1/2 dose of insulin the night before.      On the morning of your procedure, take your Aspirin and any morning medicines NOT listed above.  You may use sips of water.  5. Plan for one night stay--bring personal belongings. 6. Bring a current list of your medications and current insurance cards. 7. You MUST have a responsible person to drive you home. 8. Someone MUST be with you the first 24 hours after you arrive home or your discharge will be delayed. 9. Please wear clothes that are easy to get on and off and wear slip-on shoes.  Thank you for allowing [place] to care for you!   -- [facility]

## 2016-08-19 ENCOUNTER — Telehealth: Payer: Self-pay | Admitting: Cardiology

## 2016-08-19 NOTE — Telephone Encounter (Signed)
Returned call, no answer- left message for pt ro return call.

## 2016-08-19 NOTE — Telephone Encounter (Signed)
Pt is needing a letter stating why he's out of work, what procedure he's having done and if he's going to need to be out of work long term. He's needing to get his disability reinstated since he's not able to work right now, states he will need it by Monday.

## 2016-08-19 NOTE — Telephone Encounter (Signed)
Spoke with pt, let him know that Dr. Wyline Mood only has him out of work for 2 weeks at this time. If things change  after his procedure on Friday then we will address it then. Pt voiced understanding.

## 2016-08-20 ENCOUNTER — Other Ambulatory Visit: Payer: Self-pay | Admitting: Cardiology

## 2016-08-20 ENCOUNTER — Telehealth: Payer: Self-pay

## 2016-08-20 DIAGNOSIS — R0789 Other chest pain: Secondary | ICD-10-CM

## 2016-08-20 NOTE — Telephone Encounter (Addendum)
Patient contacted pre-catheterization at [facility] scheduled for: 08/21/2016 @ 1330 Verified arrival time and place:  NT @ 1130 Confirmed AM meds to be taken pre-cath with sip of water: Take ASA Hold Metformin, victoza and steglatro Confirmed patient has responsible person to drive home post procedure and observe patient for 24 hours: yes

## 2016-08-21 ENCOUNTER — Ambulatory Visit (HOSPITAL_COMMUNITY)
Admission: RE | Admit: 2016-08-21 | Discharge: 2016-08-22 | Disposition: A | Discharge status: Home / Self Care | Payer: 59 | Source: Ambulatory Visit | Attending: Cardiovascular Disease | Admitting: Cardiovascular Disease

## 2016-08-21 ENCOUNTER — Encounter (HOSPITAL_COMMUNITY): Payer: Self-pay | Admitting: *Deleted

## 2016-08-21 ENCOUNTER — Encounter (HOSPITAL_COMMUNITY)
Admission: RE | Disposition: A | Discharge status: Home / Self Care | Payer: Self-pay | Source: Ambulatory Visit | Attending: Cardiovascular Disease

## 2016-08-21 DIAGNOSIS — I252 Old myocardial infarction: Secondary | ICD-10-CM | POA: Insufficient documentation

## 2016-08-21 DIAGNOSIS — Z79899 Other long term (current) drug therapy: Secondary | ICD-10-CM | POA: Diagnosis not present

## 2016-08-21 DIAGNOSIS — Z7901 Long term (current) use of anticoagulants: Secondary | ICD-10-CM | POA: Insufficient documentation

## 2016-08-21 DIAGNOSIS — Z7982 Long term (current) use of aspirin: Secondary | ICD-10-CM | POA: Diagnosis not present

## 2016-08-21 DIAGNOSIS — R7989 Other specified abnormal findings of blood chemistry: Secondary | ICD-10-CM | POA: Diagnosis present

## 2016-08-21 DIAGNOSIS — E119 Type 2 diabetes mellitus without complications: Secondary | ICD-10-CM | POA: Diagnosis not present

## 2016-08-21 DIAGNOSIS — I240 Acute coronary thrombosis not resulting in myocardial infarction: Secondary | ICD-10-CM | POA: Diagnosis present

## 2016-08-21 DIAGNOSIS — I2510 Atherosclerotic heart disease of native coronary artery without angina pectoris: Secondary | ICD-10-CM | POA: Diagnosis present

## 2016-08-21 DIAGNOSIS — I110 Hypertensive heart disease with heart failure: Secondary | ICD-10-CM | POA: Insufficient documentation

## 2016-08-21 DIAGNOSIS — G4733 Obstructive sleep apnea (adult) (pediatric): Secondary | ICD-10-CM | POA: Diagnosis present

## 2016-08-21 DIAGNOSIS — I200 Unstable angina: Secondary | ICD-10-CM

## 2016-08-21 DIAGNOSIS — R0789 Other chest pain: Secondary | ICD-10-CM

## 2016-08-21 DIAGNOSIS — E118 Type 2 diabetes mellitus with unspecified complications: Secondary | ICD-10-CM | POA: Diagnosis present

## 2016-08-21 DIAGNOSIS — Z7902 Long term (current) use of antithrombotics/antiplatelets: Secondary | ICD-10-CM | POA: Diagnosis not present

## 2016-08-21 DIAGNOSIS — I25110 Atherosclerotic heart disease of native coronary artery with unstable angina pectoris: Secondary | ICD-10-CM

## 2016-08-21 DIAGNOSIS — I5021 Acute systolic (congestive) heart failure: Secondary | ICD-10-CM | POA: Insufficient documentation

## 2016-08-21 DIAGNOSIS — F418 Other specified anxiety disorders: Secondary | ICD-10-CM | POA: Diagnosis not present

## 2016-08-21 DIAGNOSIS — K760 Fatty (change of) liver, not elsewhere classified: Secondary | ICD-10-CM | POA: Diagnosis not present

## 2016-08-21 DIAGNOSIS — Z7984 Long term (current) use of oral hypoglycemic drugs: Secondary | ICD-10-CM | POA: Insufficient documentation

## 2016-08-21 DIAGNOSIS — R945 Abnormal results of liver function studies: Secondary | ICD-10-CM | POA: Diagnosis present

## 2016-08-21 DIAGNOSIS — E782 Mixed hyperlipidemia: Secondary | ICD-10-CM | POA: Diagnosis not present

## 2016-08-21 DIAGNOSIS — Z955 Presence of coronary angioplasty implant and graft: Secondary | ICD-10-CM | POA: Insufficient documentation

## 2016-08-21 DIAGNOSIS — I255 Ischemic cardiomyopathy: Secondary | ICD-10-CM | POA: Diagnosis not present

## 2016-08-21 DIAGNOSIS — I513 Intracardiac thrombosis, not elsewhere classified: Secondary | ICD-10-CM

## 2016-08-21 DIAGNOSIS — I10 Essential (primary) hypertension: Secondary | ICD-10-CM | POA: Diagnosis present

## 2016-08-21 HISTORY — DX: Acute coronary thrombosis not resulting in myocardial infarction: I24.0

## 2016-08-21 HISTORY — PX: RIGHT/LEFT HEART CATH AND CORONARY ANGIOGRAPHY: CATH118266

## 2016-08-21 HISTORY — DX: Intracardiac thrombosis, not elsewhere classified: I51.3

## 2016-08-21 HISTORY — PX: CORONARY STENT INTERVENTION: CATH118234

## 2016-08-21 HISTORY — PX: INTRAVASCULAR PRESSURE WIRE/FFR STUDY: CATH118243

## 2016-08-21 LAB — GLUCOSE, CAPILLARY
GLUCOSE-CAPILLARY: 161 mg/dL — AB (ref 65–99)
GLUCOSE-CAPILLARY: 142 mg/dL — AB (ref 65–99)
Glucose-Capillary: 189 mg/dL — ABNORMAL HIGH (ref 65–99)

## 2016-08-21 LAB — POCT I-STAT 3, ART BLOOD GAS (G3+)
Acid-base deficit: 2 mmol/L (ref 0.0–2.0)
BICARBONATE: 24.4 mmol/L (ref 20.0–28.0)
O2 Saturation: 100 %
PH ART: 7.326 — AB (ref 7.350–7.450)
PO2 ART: 409 mmHg — AB (ref 83.0–108.0)
TCO2: 26 mmol/L (ref 0–100)
pCO2 arterial: 46.8 mmHg (ref 32.0–48.0)

## 2016-08-21 LAB — POCT ACTIVATED CLOTTING TIME
ACTIVATED CLOTTING TIME: 279 s
Activated Clotting Time: 555 seconds

## 2016-08-21 LAB — POCT I-STAT 3, VENOUS BLOOD GAS (G3P V)
Acid-base deficit: 2 mmol/L (ref 0.0–2.0)
BICARBONATE: 24.7 mmol/L (ref 20.0–28.0)
O2 SAT: 75 %
PCO2 VEN: 46.6 mmHg (ref 44.0–60.0)
PO2 VEN: 44 mmHg (ref 32.0–45.0)
TCO2: 26 mmol/L (ref 0–100)
pH, Ven: 7.332 (ref 7.250–7.430)

## 2016-08-21 SURGERY — RIGHT/LEFT HEART CATH AND CORONARY ANGIOGRAPHY
Anesthesia: LOCAL

## 2016-08-21 MED ORDER — ONDANSETRON HCL 4 MG/2ML IJ SOLN
4.0000 mg | Freq: Four times a day (QID) | INTRAMUSCULAR | Status: DC | PRN
  Administered 2016-08-21: 4 mg via INTRAVENOUS
  Filled 2016-08-21: qty 2

## 2016-08-21 MED ORDER — IOPAMIDOL (ISOVUE-370) INJECTION 76%
INTRAVENOUS | Status: AC
  Filled 2016-08-21: qty 100

## 2016-08-21 MED ORDER — SODIUM CHLORIDE 0.9% FLUSH: 3.0000 mL | Freq: Two times a day (BID) | INTRAVENOUS | Status: DC

## 2016-08-21 MED ORDER — SODIUM CHLORIDE 0.9 % IV SOLN: 250.0000 mL | INTRAVENOUS | Status: DC | PRN

## 2016-08-21 MED ORDER — LIDOCAINE HCL (PF) 1 % IJ SOLN
INTRAMUSCULAR | Status: AC
  Filled 2016-08-21: qty 30

## 2016-08-21 MED ORDER — CARVEDILOL 3.125 MG PO TABS
6.2500 mg | ORAL_TABLET | Freq: Two times a day (BID) | ORAL | Status: DC
  Administered 2016-08-21 – 2016-08-22 (×2): 6.25 mg via ORAL
  Filled 2016-08-21 (×2): qty 2

## 2016-08-21 MED ORDER — IOPAMIDOL (ISOVUE-370) INJECTION 76%
INTRAVENOUS | Status: AC
  Filled 2016-08-21: qty 50

## 2016-08-21 MED ORDER — NITROGLYCERIN 1 MG/10 ML FOR IR/CATH LAB
INTRA_ARTERIAL | Status: AC
Start: 2016-08-21 — End: 2016-08-21
  Filled 2016-08-21: qty 10

## 2016-08-21 MED ORDER — HEPARIN SODIUM (PORCINE) 1000 UNIT/ML IJ SOLN
INTRAMUSCULAR | Status: DC | PRN
  Administered 2016-08-21: 5000 [IU] via INTRAVENOUS
  Administered 2016-08-21: 3000 [IU] via INTRAVENOUS
  Administered 2016-08-21: 5000 [IU] via INTRAVENOUS

## 2016-08-21 MED ORDER — SODIUM CHLORIDE 0.9 % IV SOLN
INTRAVENOUS | Status: DC
  Administered 2016-08-21: 12:00:00 via INTRAVENOUS

## 2016-08-21 MED ORDER — FENTANYL CITRATE (PF) 100 MCG/2ML IJ SOLN
INTRAMUSCULAR | Status: AC
  Filled 2016-08-21: qty 2

## 2016-08-21 MED ORDER — HEPARIN (PORCINE) IN NACL 2-0.9 UNIT/ML-% IJ SOLN
INTRAMUSCULAR | Status: AC
  Filled 2016-08-21: qty 1000

## 2016-08-21 MED ORDER — FAMOTIDINE IN NACL 20-0.9 MG/50ML-% IV SOLN
INTRAVENOUS | Status: AC | PRN
  Administered 2016-08-21: 20 mg via INTRAVENOUS

## 2016-08-21 MED ORDER — ALUM & MAG HYDROXIDE-SIMETH 200-200-20 MG/5ML PO SUSP
ORAL | Status: DC | PRN
  Administered 2016-08-21: 30 mL via ORAL

## 2016-08-21 MED ORDER — VERAPAMIL HCL 2.5 MG/ML IV SOLN
INTRAVENOUS | Status: DC | PRN
  Administered 2016-08-21: 3 mL via INTRA_ARTERIAL

## 2016-08-21 MED ORDER — HEPARIN SODIUM (PORCINE) 1000 UNIT/ML IJ SOLN
INTRAMUSCULAR | Status: AC
  Filled 2016-08-21: qty 1

## 2016-08-21 MED ORDER — SODIUM CHLORIDE 0.9 % IV SOLN
INTRAVENOUS | Status: AC | PRN
  Administered 2016-08-21: 10 mL/h via INTRAVENOUS

## 2016-08-21 MED ORDER — ADULT MULTIVITAMIN W/MINERALS CH
1.0000 | ORAL_TABLET | Freq: Every day | ORAL | Status: DC
  Administered 2016-08-22: 09:00:00 1 via ORAL
  Filled 2016-08-21 (×2): qty 1

## 2016-08-21 MED ORDER — LISINOPRIL 20 MG PO TABS
20.0000 mg | ORAL_TABLET | Freq: Every day | ORAL | Status: DC
  Administered 2016-08-21 – 2016-08-22 (×2): 20 mg via ORAL
  Filled 2016-08-21: qty 2
  Filled 2016-08-21: qty 1

## 2016-08-21 MED ORDER — HEPARIN (PORCINE) IN NACL 2-0.9 UNIT/ML-% IJ SOLN
INTRAMUSCULAR | Status: AC | PRN
  Administered 2016-08-21: 1000 mL

## 2016-08-21 MED ORDER — NITROGLYCERIN 0.4 MG SL SUBL: 0.4000 mg | SUBLINGUAL_TABLET | SUBLINGUAL | Status: DC | PRN

## 2016-08-21 MED ORDER — TOPIRAMATE 25 MG PO TABS
25.0000 mg | ORAL_TABLET | Freq: Two times a day (BID) | ORAL | Status: DC
  Administered 2016-08-21 – 2016-08-22 (×2): 25 mg via ORAL
  Filled 2016-08-21 (×2): qty 1

## 2016-08-21 MED ORDER — SODIUM CHLORIDE 0.9% FLUSH: 3.0000 mL | INTRAVENOUS | Status: DC | PRN

## 2016-08-21 MED ORDER — LIDOCAINE HCL (PF) 1 % IJ SOLN
INTRAMUSCULAR | Status: DC | PRN
  Administered 2016-08-21: 3 mL
  Administered 2016-08-21: 1 mL

## 2016-08-21 MED ORDER — SODIUM CHLORIDE 0.9 % IV SOLN
INTRAVENOUS | Status: AC
  Administered 2016-08-21: 17:00:00 via INTRAVENOUS

## 2016-08-21 MED ORDER — FENTANYL CITRATE (PF) 100 MCG/2ML IJ SOLN
INTRAMUSCULAR | Status: DC | PRN
  Administered 2016-08-21 (×2): 50 ug via INTRAVENOUS

## 2016-08-21 MED ORDER — FAMOTIDINE IN NACL 20-0.9 MG/50ML-% IV SOLN
INTRAVENOUS | Status: AC
  Filled 2016-08-21: qty 50

## 2016-08-21 MED ORDER — ACTIVE PARTNERSHIP FOR HEALTH OF YOUR HEART BOOK
Freq: Once | Status: AC
  Administered 2016-08-21: 20:00:00
  Filled 2016-08-21: qty 1

## 2016-08-21 MED ORDER — ONDANSETRON HCL 4 MG/2ML IJ SOLN
INTRAMUSCULAR | Status: AC
  Filled 2016-08-21: qty 2

## 2016-08-21 MED ORDER — CLOPIDOGREL BISULFATE 300 MG PO TABS
ORAL_TABLET | ORAL | Status: AC
  Filled 2016-08-21: qty 2

## 2016-08-21 MED ORDER — ONDANSETRON HCL 4 MG/2ML IJ SOLN
INTRAMUSCULAR | Status: DC | PRN
  Administered 2016-08-21: 4 mg via INTRAVENOUS

## 2016-08-21 MED ORDER — CLOPIDOGREL BISULFATE 75 MG PO TABS
75.0000 mg | ORAL_TABLET | Freq: Every day | ORAL | Status: DC
  Administered 2016-08-22: 75 mg via ORAL
  Filled 2016-08-21: qty 1

## 2016-08-21 MED ORDER — IOPAMIDOL (ISOVUE-370) INJECTION 76%
INTRAVENOUS | Status: DC | PRN
  Administered 2016-08-21: 200 mL via INTRAVENOUS

## 2016-08-21 MED ORDER — ADENOSINE 12 MG/4ML IV SOLN
INTRAVENOUS | Status: AC
  Filled 2016-08-21: qty 16

## 2016-08-21 MED ORDER — ATORVASTATIN CALCIUM 80 MG PO TABS
80.0000 mg | ORAL_TABLET | Freq: Every day | ORAL | Status: DC
  Administered 2016-08-21: 80 mg via ORAL
  Filled 2016-08-21: qty 1

## 2016-08-21 MED ORDER — ACETAMINOPHEN 325 MG PO TABS
650.0000 mg | ORAL_TABLET | ORAL | Status: DC | PRN
  Administered 2016-08-21: 18:00:00 650 mg via ORAL
  Filled 2016-08-21: qty 2

## 2016-08-21 MED ORDER — MIDAZOLAM HCL 2 MG/2ML IJ SOLN
INTRAMUSCULAR | Status: DC | PRN
Start: 2016-08-21 — End: 2016-08-21
  Administered 2016-08-21: 2 mg via INTRAVENOUS

## 2016-08-21 MED ORDER — MIDAZOLAM HCL 2 MG/2ML IJ SOLN
INTRAMUSCULAR | Status: AC
  Filled 2016-08-21: qty 2

## 2016-08-21 MED ORDER — ASPIRIN 81 MG PO CHEW: 81.0000 mg | CHEWABLE_TABLET | ORAL | Status: DC

## 2016-08-21 MED ORDER — VERAPAMIL HCL 2.5 MG/ML IV SOLN
INTRAVENOUS | Status: AC
  Filled 2016-08-21: qty 2

## 2016-08-21 MED ORDER — ALUM & MAG HYDROXIDE-SIMETH 200-200-20 MG/5ML PO SUSP
ORAL | Status: AC
  Filled 2016-08-21: qty 30

## 2016-08-21 MED ORDER — TRAMADOL HCL 50 MG PO TABS
50.0000 mg | ORAL_TABLET | Freq: Four times a day (QID) | ORAL | Status: DC | PRN
  Administered 2016-08-21: 21:00:00 50 mg via ORAL
  Filled 2016-08-21: qty 1

## 2016-08-21 MED ORDER — ADENOSINE (DIAGNOSTIC) 140MCG/KG/MIN
INTRAVENOUS | Status: DC | PRN
Start: 2016-08-21 — End: 2016-08-21
  Administered 2016-08-21: 140 ug/kg/min via INTRAVENOUS

## 2016-08-21 MED ORDER — CLOPIDOGREL BISULFATE 300 MG PO TABS
ORAL_TABLET | ORAL | Status: DC | PRN
Start: 2016-08-21 — End: 2016-08-21
  Administered 2016-08-21: 600 mg via ORAL

## 2016-08-21 MED ORDER — NITROGLYCERIN 1 MG/10 ML FOR IR/CATH LAB
INTRA_ARTERIAL | Status: DC | PRN
  Administered 2016-08-21: 200 ug via INTRACORONARY

## 2016-08-21 MED ORDER — ANGIOPLASTY BOOK
Freq: Once | Status: AC
  Administered 2016-08-21: 20:00:00
  Filled 2016-08-21: qty 1

## 2016-08-21 MED ORDER — ASPIRIN EC 81 MG PO TBEC
81.0000 mg | DELAYED_RELEASE_TABLET | Freq: Every day | ORAL | Status: DC
  Administered 2016-08-22: 81 mg via ORAL
  Filled 2016-08-21: qty 1

## 2016-08-21 SURGICAL SUPPLY — 21 items
BALLN EMERGE MR 2.0X8 (BALLOONS) ×2
BALLN SAPPHIRE ~~LOC~~ 2.25X8 (BALLOONS) ×2 IMPLANT
BALLOON EMERGE MR 2.0X8 (BALLOONS) ×1 IMPLANT
CATH 5FR JL3.5 JR4 ANG PIG MP (CATHETERS) ×2 IMPLANT
CATH BALLN WEDGE 5F 110CM (CATHETERS) ×2 IMPLANT
CATH INFINITI 5 FR MPA2 (CATHETERS) ×2 IMPLANT
CATH INFINITI 5FR AL1 (CATHETERS) ×2 IMPLANT
CATH VISTA GUIDE 6FR XB3.5 (CATHETERS) ×2 IMPLANT
DEVICE RAD COMP TR BAND LRG (VASCULAR PRODUCTS) ×2 IMPLANT
GLIDESHEATH SLEND SS 6F .021 (SHEATH) ×2 IMPLANT
GUIDEWIRE INQWIRE 1.5J.035X260 (WIRE) ×1 IMPLANT
GUIDEWIRE PRESSURE COMET II (WIRE) ×2 IMPLANT
INQWIRE 1.5J .035X260CM (WIRE) ×2
KIT ENCORE 26 ADVANTAGE (KITS) ×2 IMPLANT
KIT HEART LEFT (KITS) ×2 IMPLANT
PACK CARDIAC CATHETERIZATION (CUSTOM PROCEDURE TRAY) ×2 IMPLANT
SHEATH GLIDE SLENDER 4/5FR (SHEATH) ×2 IMPLANT
STENT SYNERGY DES 2.25X12 (Permanent Stent) ×2 IMPLANT
TRANSDUCER W/STOPCOCK (MISCELLANEOUS) ×2 IMPLANT
TUBING CIL FLEX 10 FLL-RA (TUBING) ×2 IMPLANT
WIRE COUGAR XT STRL 190CM (WIRE) ×2 IMPLANT

## 2016-08-21 NOTE — Progress Notes (Signed)
Removed Rt brachial sheath, held manual pressure for 10, no bleeding, applied gauze dressing with tegaderm. Will monitor.

## 2016-08-21 NOTE — Care Management Note (Signed)
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 S/p stent intervention, will be on plavix.                  Action/Plan: NCM will follow for dc needs.   Expected Discharge Date:                  Expected Discharge Plan:  Home/Self Care  In-House Referral:     Discharge planning Services  CM Consult  Post Acute Care Choice:    Choice offered to:     DME Arranged:    DME Agency:     HH Arranged:    HH Agency:     Status of Service:  Completed, signed off  If discussed at [employer] of Stay Meetings, dates discussed:    Additional Comments:  Leone Haven, RN 08/21/2016, 5:31 PM

## 2016-08-21 NOTE — Interval H&P Note (Signed)
History and Physical Interval Note:  08/21/2016 1:10 PM  [name] [name]  has presented today for cardiac cath with the diagnosis of CAD, unstable angina, systolic CHF. The various methods of treatment have been discussed with the patient and family. After consideration of risks, benefits and other options for treatment, the patient has consented to  Procedure(s): Right/Left Heart Cath and Coronary Angiography (N/A) as a surgical intervention .  The patient's history has been reviewed, patient examined, no change in status, stable for surgery.  I have reviewed the patient's chart and labs.  Questions were answered to the patient's satisfaction.    Cath Lab Visit (complete for each Cath Lab visit)  Clinical Evaluation Leading to the Procedure:   ACS: No.  Non-ACS:    Anginal Classification: CCS III  Anti-ischemic medical therapy: Minimal Therapy (1 class of medications)  Non-Invasive Test Results: No non-invasive testing performed  Prior CABG: No previous CABG         Verne Carrow

## 2016-08-21 NOTE — Progress Notes (Signed)
TR BAND REMOVAL  LOCATION:    right radial  DEFLATED PER PROTOCOL:    Yes.    TIME BAND OFF / DRESSING APPLIED:    20:45   SITE UPON ARRIVAL:    Level 0  SITE AFTER BAND REMOVAL:    Level 0  CIRCULATION SENSATION AND MOVEMENT:    Within Normal Limits   Yes.    COMMENTS:   Post TR band instructions given. Pt tolerated well. 

## 2016-08-21 NOTE — H&P (View-Only) (Signed)
Clinical Summary [name] [name] is a 50 y.o.male seen today for follow up of the following medical problems. This is a focused visit on his history of CAD and new diagnosis of ICM.   1. CAD - history of anterior STEMI 02/2008, received DES to LAD. LVEF at that time was 35% - 2012 myoview anterior scar with mild ischemia - 06/2011 echo LVEF 45-50% - 06/2011 GXT no ischemia  - recent chest pains on and off x 2-3 years. - pressure like pain midchest, 6-7/10. Can occur at rest or with activity, worst with activity. Increasing DOE. Occasional LE edema - compliant with meds. Not on ASA for unclear reasons, he stopped statin. Ran out of beta blocker - seen in [place] ER with chest pain a few weeks ago. Records not available at this time.  - symptoms typically come on with emotional stress or physical activity. Pain lasts several minutes at time. Not better with antacids - increase in frequency, stable severity   - 07/2016 echo shows LVEF down to 30-35%, diffuse hypokinesis with akinesis of anerior and anteroseptal walls. Evidence of LV thrombus - still with chest pain at times. Stable SOB/dOE, worst in hot weather.    2. 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  Past Medical History:  Diagnosis Date  . Angina effort (HCC)   . Arthritis    in lower back  . Coronary artery disease    a. 02/2008: anterior STEMI s/p DES to prox LAD, b. Myoview 2012: scar in the anterior wall with mild ischemia and EF 33%, c. neg GXT 06/2011  . Depression with anxiety   . Diabetes mellitus    TYPE II  . Fatty liver    H/O ELEVATED LIVER ENZYMES  . Hyperlipidemia   . Ischemic cardiomyopathy    a. Previous EF 33-35% 2012, b. improved to 45-50% by echo 06/2011  . Noncompliance    Prior hx of med noncompliance  . OSA (obstructive sleep apnea)      No Known Allergies   Current Outpatient Prescriptions  Medication Sig Dispense Refill  . aspirin EC 81 MG tablet Take 81 mg by  mouth daily.    Marland Kitchen atorvastatin (LIPITOR) 80 MG tablet Take 1 tablet (80 mg total) by mouth daily. 90 tablet 1  . carvedilol (COREG) 3.125 MG tablet Take 1 tablet (3.125 mg total) by mouth 2 (two) times daily. 180 tablet 1  . Ertugliflozin L-PyroglutamicAc (STEGLATRO) 5 MG TABS Take by mouth.    . liraglutide (VICTOZA) 18 MG/3ML SOPN Inject into the skin.    Marland Kitchen lisinopril (PRINIVIL,ZESTRIL) 20 MG tablet Take 20 mg by mouth daily.    . metFORMIN (GLUCOPHAGE) 1000 MG tablet Take 1,000 mg by mouth 2 (two) times daily with a meal.    . nitroGLYCERIN (NITROSTAT) 0.4 MG SL tablet Place 0.4 mg under the tongue every 5 (five) minutes as needed for chest pain.    . sildenafil (REVATIO) 20 MG tablet Take 20 mg by mouth 3 (three) times daily.    Marland Kitchen topiramate (TOPAMAX) 25 MG capsule Take 25 mg by mouth 2 (two) times daily.     No current facility-administered medications for this visit.      Past Surgical History:  Procedure Laterality Date  . LIVER BIOPSY    . MI WITH STENTS    . NASAL SINUS SURGERY       No Known Allergies    Family History  Problem Relation Age of Onset  . Coronary artery disease Unknown        UNKNOWN  . Heart disease Father        CAD s/p CABG  . Diabetes Mellitus II Father   . Diabetes Mellitus II Mother   . Cancer Brother      Social History [name] reports that he has never smoked. He has never used smokeless tobacco. [name] reports that he does not drink alcohol.   Review of Systems CONSTITUTIONAL: No weight loss, fever, chills, weakness or fatigue.  HEENT: Eyes: No visual loss, blurred vision, double vision or yellow sclerae.No hearing loss, sneezing, congestion, runny nose or sore throat.  SKIN: No rash or itching.  CARDIOVASCULAR: per hpi RESPIRATORY: per hpi GASTROINTESTINAL: No anorexia, nausea, vomiting or diarrhea. No abdominal pain or blood.  GENITOURINARY: No burning on urination, no polyuria NEUROLOGICAL: No headache, dizziness, syncope,  paralysis, ataxia, numbness or tingling in the extremities. No change in bowel or bladder control.  MUSCULOSKELETAL: No muscle, back pain, joint pain or stiffness.  LYMPHATICS: No enlarged nodes. No history of splenectomy.  PSYCHIATRIC: No history of depression or anxiety.  ENDOCRINOLOGIC: No reports of sweating, cold or heat intolerance. No polyuria or polydipsia.  Marland Kitchen   Physical Examination Vitals:   08/18/16 1351  BP: 134/88  Pulse: 83   Vitals:   08/18/16 1351  Weight: 235 lb (106.6 kg)  Height: 5\' 10"  (1.778 m)    Gen: resting comfortably, no acute distress HEENT: no scleral icterus, pupils equal round and reactive, no palptable cervical adenopathy,  CV: RRR, no m/r/g, no jvd Resp: Clear to auscultation bilaterally GI: abdomen is soft, non-tender, non-distended, normal bowel sounds, no hepatosplenomegaly MSK: extremities are warm, no edema.  Skin: warm, no rash Neuro:  no focal deficits Psych: appropriate affect   Diagnostic Studies 6./2013 echo Study Conclusions  - Left ventricle: Systolic function was mildly reduced. The estimated ejection fraction was in the range of 45% to 50%. - Left atrium: The atrium was mildly dilated.  08/07/16 echo Study Conclusions  - Left ventricle: LV systolic function is depressed at   approximately 30 to 35% with severe hypokinesis/akinesis of the   inferior, septal, distal lateral and apical walls. Cannot exclude   mural thrombus at apex. Suggest limited echo with Definity to   define The cavity size was normal. Wall thickness was normal. - Mitral valve: There was mild regurgitation. - Left atrium: The atrium was mildly dilated.  08/14/16 echo Study Conclusions  - Procedure narrative: Transthoracic echocardiography. Image   quality was adequate. The study was technically difficult.   Intravenous contrast (Definity) was administered. - Left ventricle: Systolic function was moderately to severely   reduced. The estimated  ejection fraction was in the range of 30%   to 35%. Diffuse hypokinesis. There is akinesis of the   anteroseptal, anterior, and apical myocardium. - Right ventricle: Systolic function was normal.  Impressions:  - Limited study with Definity contrast. LVEF is in the range of   30-35%. There is a loosely organized LV apical thrombus noted.   Diffuse hypokinesis overall with akinesis of the anterior,   anteroseptal, and apical myocardium. RV contraction normal based   on limited views.   Assessment and Plan  1. CAD/Acute systolic HF - recent symptoms as descriebd above combined with new diagnosis of systolic HF with LVEF 30-35%.  -we will plan for LHC/RHC - continue coreg and lisinopril, work to titrate over follow ups.  2. LV thrombus - noted on recent echo - will start coumadin, he is for cath this week, start coumadin after procedure, arrange appt in coumadin clinic early next week        Antoine Poche, M.D

## 2016-08-22 ENCOUNTER — Ambulatory Visit (HOSPITAL_COMMUNITY): Payer: Self-pay

## 2016-08-22 ENCOUNTER — Encounter (HOSPITAL_COMMUNITY): Payer: Self-pay | Admitting: Physician Assistant

## 2016-08-22 DIAGNOSIS — I200 Unstable angina: Secondary | ICD-10-CM

## 2016-08-22 DIAGNOSIS — I10 Essential (primary) hypertension: Secondary | ICD-10-CM

## 2016-08-22 DIAGNOSIS — I240 Acute coronary thrombosis not resulting in myocardial infarction: Secondary | ICD-10-CM | POA: Diagnosis present

## 2016-08-22 DIAGNOSIS — I513 Intracardiac thrombosis, not elsewhere classified: Secondary | ICD-10-CM

## 2016-08-22 DIAGNOSIS — G4733 Obstructive sleep apnea (adult) (pediatric): Secondary | ICD-10-CM | POA: Diagnosis present

## 2016-08-22 DIAGNOSIS — I255 Ischemic cardiomyopathy: Secondary | ICD-10-CM

## 2016-08-22 DIAGNOSIS — E118 Type 2 diabetes mellitus with unspecified complications: Secondary | ICD-10-CM | POA: Diagnosis present

## 2016-08-22 DIAGNOSIS — I252 Old myocardial infarction: Secondary | ICD-10-CM | POA: Diagnosis not present

## 2016-08-22 DIAGNOSIS — Z955 Presence of coronary angioplasty implant and graft: Secondary | ICD-10-CM

## 2016-08-22 DIAGNOSIS — I25110 Atherosclerotic heart disease of native coronary artery with unstable angina pectoris: Secondary | ICD-10-CM | POA: Diagnosis not present

## 2016-08-22 LAB — BASIC METABOLIC PANEL
ANION GAP: 5 (ref 5–15)
BUN: 14 mg/dL (ref 6–20)
CO2: 24 mmol/L (ref 22–32)
Calcium: 9.1 mg/dL (ref 8.9–10.3)
Chloride: 109 mmol/L (ref 101–111)
Creatinine, Ser: 1.02 mg/dL (ref 0.61–1.24)
GFR calc Af Amer: >60 mL/min (ref >60)
GFR calc non Af Amer: >60 mL/min (ref >60)
GLUCOSE: 142 mg/dL — AB (ref 65–99)
POTASSIUM: 4.4 mmol/L (ref 3.5–5.1)
Sodium: 138 mmol/L (ref 135–145)

## 2016-08-22 LAB — CBC
HEMATOCRIT: 44.4 % (ref 39.0–52.0)
HEMOGLOBIN: 15.1 g/dL (ref 13.0–17.0)
MCH: 28.9 pg (ref 26.0–34.0)
MCHC: 34 g/dL (ref 30.0–36.0)
MCV: 85.1 fL (ref 78.0–100.0)
Platelets: 258 10*3/uL (ref 150–400)
RBC: 5.22 MIL/uL (ref 4.22–5.81)
RDW: 12.5 % (ref 11.5–15.5)
WBC: 9.9 10*3/uL (ref 4.0–10.5)

## 2016-08-22 LAB — GLUCOSE, CAPILLARY: Glucose-Capillary: 155 mg/dL — ABNORMAL HIGH (ref 65–99)

## 2016-08-22 MED ORDER — WARFARIN SODIUM 5 MG PO TABS
10.0000 mg | ORAL_TABLET | Freq: Once | ORAL | Status: AC
  Administered 2016-08-22: 09:00:00 10 mg via ORAL
  Filled 2016-08-22: qty 2

## 2016-08-22 MED ORDER — WARFARIN - PHYSICIAN DOSING INPATIENT: Freq: Every day | Status: DC

## 2016-08-22 MED ORDER — CLOPIDOGREL BISULFATE 75 MG PO TABS: 75.0000 mg | ORAL_TABLET | Freq: Every day | ORAL | 3 refills | Status: DC

## 2016-08-22 MED ORDER — COUMADIN BOOK
Freq: Once | Status: DC
  Filled 2016-08-22: qty 1

## 2016-08-22 MED ORDER — WARFARIN SODIUM 5 MG PO TABS: 5.0000 mg | ORAL_TABLET | Freq: Every day | ORAL | 0 refills | Status: DC

## 2016-08-22 NOTE — Progress Notes (Signed)
Progress Note  Patient Name: [name] [name] Date of Encounter: 08/22/2016  Primary Cardiologist: Dr. Wyline Mood  Subjective   No chest pain.  Did well walking with cardiac rehab.  Inpatient Medications    Scheduled Meds: . aspirin EC  81 mg Oral Daily  . atorvastatin  80 mg Oral q1800  . carvedilol  6.25 mg Oral BID  . clopidogrel  75 mg Oral Q breakfast  . lisinopril  20 mg Oral Daily  . multivitamin with minerals  1 tablet Oral Daily  . sodium chloride flush  3 mL Intravenous Q12H  . topiramate  25 mg Oral BID  . warfarin  10 mg Oral Once   Continuous Infusions: . sodium chloride     PRN Meds: sodium chloride, acetaminophen, nitroGLYCERIN, ondansetron (ZOFRAN) IV, sodium chloride flush, traMADol   Vital Signs    Vitals:   08/21/16 1904 08/21/16 2000 08/22/16 0208 08/22/16 0730  BP: (!) 142/98 (!) 150/108 130/90 (!) 146/96  Pulse: 84 85 70 81  Resp: (!) 21 15 11 16   Temp: 98.4 F (36.9 C)  97.6 F (36.4 C) (!) 97.5 F (36.4 C)  TempSrc: Oral  Oral Oral  SpO2: 97% 96% 90% 97%  Weight:   235 lb 9.6 oz (106.9 kg)   Height:        Intake/Output Summary (Last 24 hours) at 08/22/16 0834 Last data filed at 08/22/16 0700  Gross per 24 hour  Intake           753.75 ml  Output             2100 ml  Net         -1346.25 ml   Filed Weights   08/21/16 1047 08/22/16 0208  Weight: 235 lb (106.6 kg) 235 lb 9.6 oz (106.9 kg)    Telemetry    NSR - Personally Reviewed  ECG    NSR, poor R wave progression - Personally Reviewed  Physical Exam   GEN: No acute distress.   Neck: No JVD Cardiac: RRR, no murmurs, rubs, or gallops.  Respiratory: Clear to auscultation bilaterally. GI: Soft, nontender, non-distended  MS: No edema; No deformity. 2+ right radial pulse, no hematoma Neuro:  Nonfocal  Psych: Normal affect   Labs    Chemistry Recent Labs Lab 08/18/16 1458 08/22/16 0605  NA 142 138  K 4.2 4.4  CL 109 109  CO2 26 24  GLUCOSE 176* 142*  BUN 12 14    CREATININE 0.93 1.02  CALCIUM 10.0 9.1  GFRNONAA >60 >60  GFRAA >60 >60  ANIONGAP 7 5     Hematology Recent Labs Lab 08/18/16 1458 08/22/16 0605  WBC 8.1 9.9  RBC 5.44 5.22  HGB 15.8 15.1  HCT 46.7 44.4  MCV 85.8 85.1  MCH 29.0 28.9  MCHC 33.8 34.0  RDW 12.6 12.5  PLT 293 258    Cardiac EnzymesNo results for input(s): TROPONINI in the last 168 hours. No results for input(s): TROPIPOC in the last 168 hours.   BNPNo results for input(s): BNP, PROBNP in the last 168 hours.   DDimer No results for input(s): DDIMER in the last 168 hours.   Radiology    No results found.  Cardiac Studies   Cath reveiewed  Patient Profile     51 y.o. male with CAD, LV thrombus  Assessment & Plan    1) CAD:  Aspirin and Plavix for now.  Coulmadin starting today.  Will stop aspirin when Coumadin therapeutic  for apical thrombus.    2) COntinue home meds.  3) DM: Resume metformin 48 hours post cath.  Signed, Lance Muss, MD  08/22/2016, 8:34 AM

## 2016-08-22 NOTE — Progress Notes (Signed)
CARDIAC REHAB PHASE I   PRE:  Rate/Rhythm: 72  BP:  Sitting: 146/97     SaO2: 94  MODE:  Ambulation: 580 ft   POST:  Rate/Rhythm: 78  BP:  Sitting: 144/86     SaO2: 99  8:20am-9:00am Patient ambulated independently with no problems. Interested in Cardiac Rehab at [facility]. Patient states he really needs guidance on diet. Did a lot of education. He eats out a lot and admits to eating poorly. We talked about exercising at home, risk factors, and restrictions. Will send referral to [facility].   [name] Lister [name], MS 08/22/2016 8:56 AM

## 2016-08-22 NOTE — Discharge Summary (Signed)
Discharge Summary    Patient ID: [name] [name],  MRN: [id number], DOB/AGE: 51-May-1967 51 y.o.  Admit date: 08/21/2016 Discharge date: 08/22/2016  Primary Care Provider: Samuel Jester Primary Cardiologist: Dr. Wyline Mood    Discharge Diagnoses    Principal Problem:   Unstable angina [facility]) Active Problems:   Mixed hyperlipidemia   Essential hypertension, benign   CORONARY ATHEROSCLEROSIS NATIVE CORONARY ARTERY   Cardiomyopathy, ischemic   Abnormal LFTs (liver function tests)   LV (left ventricular) mural thrombus without MI   Type 2 diabetes mellitus with complication (HCC)   OSA (obstructive sleep apnea)   Allergies No Known Allergies   History of Present Illness     [name] [name] is a 51 y.o. male with a history of CAD s/p DES to LAD (2010), DMT2, OSA, HLD, HTN, ischemic CM, and recently diagnosed LV thrombus who presented to [facility] on 08/21/16 for planned [facility].   He has a history of anterior STEMI in 02/2008 and received DES to LAD. LVEF at that time was 35%. 2012 myoview anterior scar with mild ischemia. 06/2011 echo showed improvement in LVEF to 45-50%. In 06/2011, he had a GXT with no ischemia  He was recently seen in the office by Dr. Wyline Mood for worsening CP and SOB. 2D ECHO in 07/2016 showed LVEF down to 30-35%, diffuse hypokinesis with akinesis of anerior and anteroseptal walls as well as evidence of LV thrombus. Plan was to start coumadin after L/RHC which was set up for 08/21/16.   Hospital Course     Consultants: none   Cath showed mild RCA disease, moderate mLAD stenosis that was not HD significant by FFR and 99% 2nd Diag lesion s/p DES. He was placed on ASA and plavix. Given his LV thrombus, we started him on coumadin after his cath. He was given coumadin  x1 on 08/22/16, with plans to take  daily thereafter until seen in the coumadin clinic appointment on 08/24/16 @ 2:30pm. Plan is to discontinue ASA when coumadin therapeutic. He has been instructed to hold his  metformin for at least 48 hours post cath. He will continue on coreg, lisinopril and atorvastatin for the treatment of his cardiomyopathy and CAD. Plan will be for cardiac rehab at [facility].    The patient has had an uncomplicated hospital course and is recovering well. The radial catheter site is stable. He has been seen by Dr. Eldridge Dace today and deemed ready for discharge home. All follow-up appointments have been scheduled. Discharge medications are listed below. I will send staff message to the office to see if his appointment with Dr. Wyline Mood can be moved up closer than 8/30. A work note was written to keep him out of work for 2 weeks. He is a Naval architect.   _____________  Discharge Vitals Blood pressure (!) 146/96, pulse 81, temperature (!) 97.5 F (36.4 C), temperature source Oral, resp. rate 16, height  (1.778 m), weight 235 lb 9.6 oz (106.9 kg), SpO2 97 %.  Filed Weights   08/21/16 1047 08/22/16 0208  Weight: 235 lb (106.6 kg) 235 lb 9.6 oz (106.9 kg)    Labs & Radiologic Studies     CBC  Recent Labs  08/22/16 0605  WBC 9.9  HGB 15.1  HCT 44.4  MCV 85.1  PLT 258   Basic Metabolic Panel  Recent Labs  08/22/16 0605  NA 138  K 4.4  CL 109  CO2 24  GLUCOSE 142*  BUN 14  CREATININE 1.02  CALCIUM 9.1   Liver Function Tests No results for input(s): AST, ALT, ALKPHOS, BILITOT, PROT, ALBUMIN in the last 72 hours. No results for input(s): LIPASE, AMYLASE in the last 72 hours. Cardiac Enzymes No results for input(s): CKTOTAL, CKMB, CKMBINDEX, TROPONINI in the last 72 hours. BNP Invalid input(s): POCBNP D-Dimer No results for input(s): DDIMER in the last 72 hours. Hemoglobin A1C No results for input(s): HGBA1C in the last 72 hours. Fasting Lipid Panel No results for input(s): CHOL, HDL, LDLCALC, TRIG, CHOLHDL, LDLDIRECT in the last 72 hours. Thyroid Function Tests No results for input(s): TSH, T4TOTAL, T3FREE, THYROIDAB in the last 72 hours.  Invalid input(s):  FREET3  No results found.   Diagnostic Studies/Procedures   08/21/16  Procedures  CORONARY STENT INTERVENTION  INTRAVASCULAR PRESSURE WIRE/FFR STUDY  Right/Left Heart Cath and Coronary Angiography  Conclusion    Mid RCA-2 lesion, 30 %stenosed.  Mid RCA-1 lesion, 30 %stenosed.  Ost Ramus to Ramus lesion, 20 %stenosed.  Ost LAD to Prox LAD lesion, 10 %stenosed.  Prox LAD to Mid LAD lesion, 60 %stenosed.  A STENT SYNERGY DES 2.25X12 drug eluting stent was successfully placed.  2nd Diag lesion, 99 %stenosed.  Post intervention, there is a 0% residual stenosis.   1. Patent stent proximal LAD 2. Moderate stenosis mid LAD. FFR was 0.84 suggesting the stenosis was not flow limiting.  3. The Diagonal branch is a moderate caliber vessel with a 99% stenosis.  4. Successful PTCA/DES x 1 Diagonal branch 5. Mild disease RCA  Recommendations: Will continue ASA and Plavix for now. He will need to be started on coumadin tomorrow before discharge for his LV thrombus. He can stop ASA when his INR is therapeutic on coumadin since he was not an ACS. I would plan long term coumadin for LV thrombus and Plavix for at least one year given placement of DES. Continue statin and beta blocker.      _____________    Disposition   Pt is being discharged home today in good condition.  Follow-up Plans & Appointments    Follow-up Information    [facility]. Go on 08/24/2016.   Specialty:  Cardiology Why:  @ 2:30pm for your coumadin clinic appointment.  Contact information: [address] [place] [address] [phone]       Antoine Poche, MD. Go on 09/17/2016.   Specialty:  Cardiology Why:  @ 2:40pm  Contact information: [address] [place] [address] [phone]            Discharge Medications     Medication List    TAKE these medications   aspirin EC 81 MG tablet Take 81 mg by mouth daily.   atorvastatin 80 MG  tablet Commonly known as:  LIPITOR Take 1 tablet (80 mg total) by mouth daily.   carvedilol 6.25 MG tablet Commonly known as:  COREG Take 1 tablet (6.25 mg total) by mouth 2 (two) times daily.   clopidogrel 75 MG tablet Commonly known as:  PLAVIX Take 1 tablet (75 mg total) by mouth daily with breakfast.   lisinopril 20 MG tablet Commonly known as:  PRINIVIL,ZESTRIL Take 20 mg by mouth daily.   metFORMIN 1000 MG tablet Commonly known as:  GLUCOPHAGE Take 1,000 mg by mouth 2 (two) times daily with a meal. Notes to patient:  Hold for at least 48 hours after cath.    multivitamin with minerals tablet Take 1 tablet by mouth daily.   nitroGLYCERIN 0.4 MG SL  tablet Commonly known as:  NITROSTAT Place 0.4 mg under the tongue every 5 (five) minutes as needed for chest pain.   sildenafil 20 MG tablet Commonly known as:  REVATIO Take 20 mg by mouth daily as needed.   topiramate 25 MG capsule Commonly known as:  TOPAMAX Take 25 mg by mouth 2 (two) times daily.   VICTOZA 18 MG/3ML Sopn Generic drug:  liraglutide Inject into the skin daily as needed. Takes if blood sugar is high   warfarin 5 MG tablet Commonly known as:  COUMADIN Take 1 tablet (5 mg total) by mouth daily at 6 PM.        Outstanding Labs/Studies   none  Duration of Discharge Encounter   Greater than 30 minutes including physician time.  Signed, Cline Crock PA-C 08/22/2016, 8:38 AM   I have examined the patient and reviewed assessment and plan and discussed with patient.  Agree with above as stated.  Please see my note from today.  Stable right radial.  DAPT with secondary prevention.  Starting warfarin for apical thrombus.  Manage DM and HTN.  F/u in 2 weeks.  Plan discharge today.  Lance Muss

## 2016-08-22 NOTE — Discharge Instructions (Signed)
Radial Site Care Refer to this sheet in the next few weeks. These instructions provide you with information on caring for yourself after your procedure. Your caregiver may also give you more specific instructions. Your treatment has been planned according to current medical practices, but problems sometimes occur. Call your caregiver if you have any problems or questions after your procedure. HOME CARE INSTRUCTIONS  You may shower the day after the procedure.Remove the bandage (dressing) and gently wash the site with plain soap and water.Gently pat the site dry.   Do not apply powder or lotion to the site.   Do not submerge the affected site in water for 3 to 5 days.   Inspect the site at least twice daily.   Do not flex or bend the affected arm for 24 hours.   No lifting over 5 pounds (2.3 kg) for 5 days after your procedure.   Do not drive home if you are discharged the same day of the procedure. Have someone else drive you.   You may drive 24 hours after the procedure unless otherwise instructed by your caregiver.  What to expect:  Any bruising will usually fade within 1 to 2 weeks.   Blood that collects in the tissue (hematoma) may be painful to the touch. It should usually decrease in size and tenderness within 1 to 2 weeks.  SEEK IMMEDIATE MEDICAL CARE IF:  You have unusual pain at the radial site.   You have redness, warmth, swelling, or pain at the radial site.   You have drainage (other than a small amount of blood on the dressing).   You have chills.   You have a fever or persistent symptoms for more than 72 hours.   You have a fever and your symptoms suddenly get worse.   Your arm becomes pale, cool, tingly, or numb.   You have heavy bleeding from the site. Hold pressure on the site.   Information on my medicine - Coumadin   (Warfarin)  This medication education was reviewed with me or my healthcare representative as part of my discharge preparation.  The  pharmacist that spoke with me during my hospital stay was:  Margart Sickles, [facility]  Why was Coumadin prescribed for you? Coumadin was prescribed for you because you have a blood clot or a medical condition that can cause an increased risk of forming blood clots. Blood clots can cause serious health problems by blocking the flow of blood to the heart, lung, or brain. Coumadin can prevent harmful blood clots from forming. As a reminder your indication for Coumadin is:   Blood Clot Prevention After Heart Pump Surgery   What test will check on my response to Coumadin? While on Coumadin (warfarin) you will need to have an INR test regularly to ensure that your dose is keeping you in the desired range. The INR (international normalized ratio) number is calculated from the result of the laboratory test called prothrombin time (PT).  If an INR APPOINTMENT HAS NOT ALREADY BEEN MADE FOR YOU please schedule an appointment to have this lab work done by your health care provider within 7 days. Your INR goal is usually a number between:  2 to 3 or your provider may give you a more narrow range like 2-2.5.  Ask your health care provider during an office visit what your goal INR is.  What  do you need to  know  About  COUMADIN? Take Coumadin (warfarin) exactly as prescribed by your healthcare  provider about the same time each day.  DO NOT stop taking without talking to the doctor who prescribed the medication.  Stopping without other blood clot prevention medication to take the place of Coumadin may increase your risk of developing a new clot or stroke.  Get refills before you run out.  What do you do if you miss a dose? If you miss a dose, take it as soon as you remember on the same day then continue your regularly scheduled regimen the next day.  Do not take two doses of Coumadin at the same time.  Important Safety Information A possible side effect of Coumadin (Warfarin) is an increased risk of bleeding. You should  call your healthcare provider right away if you experience any of the following: ? Bleeding from an injury or your nose that does not stop. ? Unusual colored urine (red or dark brown) or unusual colored stools (red or black). ? Unusual bruising for unknown reasons. ? A serious fall or if you hit your head (even if there is no bleeding).  Some foods or medicines interact with Coumadin (warfarin) and might alter your response to warfarin. To help avoid this: ? Eat a balanced diet, maintaining a consistent amount of Vitamin K. ? Notify your provider about major diet changes you plan to make. ? Avoid alcohol or limit your intake to 1 drink for women and 2 drinks for men per day. (1 drink is 5 oz. wine, 12 oz. beer, or 1.5 oz. liquor.)  Make sure that ANY health care provider who prescribes medication for you knows that you are taking Coumadin (warfarin).  Also make sure the healthcare provider who is monitoring your Coumadin knows when you have started a new medication including herbals and non-prescription products.  Coumadin (Warfarin)  Major Drug Interactions  Increased Warfarin Effect Decreased Warfarin Effect  Alcohol (large quantities) Antibiotics (esp. Septra/Bactrim, Flagyl, Cipro) Amiodarone (Cordarone) Aspirin (ASA) Cimetidine (Tagamet) Megestrol (Megace) NSAIDs (ibuprofen, naproxen, etc.) Piroxicam (Feldene) Propafenone (Rythmol SR) Propranolol (Inderal) Isoniazid (INH) Posaconazole (Noxafil) Barbiturates (Phenobarbital) Carbamazepine (Tegretol) Chlordiazepoxide (Librium) Cholestyramine (Questran) Griseofulvin Oral Contraceptives Rifampin Sucralfate (Carafate) Vitamin K   Coumadin (Warfarin) Major Herbal Interactions  Increased Warfarin Effect Decreased Warfarin Effect  Garlic Ginseng Ginkgo biloba Coenzyme Q10 Green tea St. Johns wort    Coumadin (Warfarin) FOOD Interactions  Eat a consistent number of servings per week of foods HIGH in Vitamin K (1 serving  =  cup)  Collards (cooked, or boiled & drained) Kale (cooked, or boiled & drained) Mustard greens (cooked, or boiled & drained) Parsley *serving size only =  cup Spinach (cooked, or boiled & drained) Swiss chard (cooked, or boiled & drained) Turnip greens (cooked, or boiled & drained)  Eat a consistent number of servings per week of foods MEDIUM-HIGH in Vitamin K (1 serving = 1 cup)  Asparagus (cooked, or boiled & drained) Broccoli (cooked, boiled & drained, or raw & chopped) Brussel sprouts (cooked, or boiled & drained) *serving size only =  cup Lettuce, raw (green leaf, endive, romaine) Spinach, raw Turnip greens, raw & chopped   These websites have more information on Coumadin (warfarin):  http://www.king-russell.com/; https://www.hines.net/;

## 2016-08-24 ENCOUNTER — Encounter (HOSPITAL_COMMUNITY): Payer: Self-pay | Admitting: Cardiovascular Disease

## 2016-08-24 ENCOUNTER — Telehealth: Payer: Self-pay | Admitting: *Deleted

## 2016-08-24 ENCOUNTER — Ambulatory Visit (INDEPENDENT_AMBULATORY_CARE_PROVIDER_SITE_OTHER): Payer: 59 | Admitting: *Deleted

## 2016-08-24 DIAGNOSIS — Z5181 Encounter for therapeutic drug level monitoring: Secondary | ICD-10-CM | POA: Diagnosis not present

## 2016-08-24 DIAGNOSIS — I513 Intracardiac thrombosis, not elsewhere classified: Secondary | ICD-10-CM | POA: Diagnosis not present

## 2016-08-24 DIAGNOSIS — I240 Acute coronary thrombosis not resulting in myocardial infarction: Secondary | ICD-10-CM

## 2016-08-24 LAB — POCT INR: INR: 1

## 2016-08-24 LAB — GLUCOSE, CAPILLARY: GLUCOSE-CAPILLARY: 115 mg/dL — AB (ref 65–99)

## 2016-08-24 NOTE — Telephone Encounter (Signed)
Patient informed of appointment change and verbalized understanding of plan.

## 2016-08-24 NOTE — Telephone Encounter (Signed)
-----   Message from Janetta Hora, [place] sent at 08/22/2016  8:41 AM EDT ----- Can we see if we can get him into see someone sooner than 8/30 for post PCI follow up. He has an appointment with [facility] 8/30 but that seems a little far. Thanks!

## 2016-08-26 ENCOUNTER — Encounter: Payer: Self-pay | Admitting: *Deleted

## 2016-08-27 ENCOUNTER — Ambulatory Visit: Payer: 59 | Admitting: Cardiology

## 2016-08-28 ENCOUNTER — Ambulatory Visit (INDEPENDENT_AMBULATORY_CARE_PROVIDER_SITE_OTHER): Payer: 59 | Admitting: Cardiology

## 2016-08-28 ENCOUNTER — Encounter: Payer: Self-pay | Admitting: Cardiology

## 2016-08-28 VITALS — BP 122/60 | HR 82 | Ht 70.0 in | Wt 229.0 lb

## 2016-08-28 DIAGNOSIS — I2510 Atherosclerotic heart disease of native coronary artery without angina pectoris: Secondary | ICD-10-CM | POA: Diagnosis not present

## 2016-08-28 DIAGNOSIS — E119 Type 2 diabetes mellitus without complications: Secondary | ICD-10-CM | POA: Diagnosis not present

## 2016-08-28 DIAGNOSIS — E669 Obesity, unspecified: Secondary | ICD-10-CM

## 2016-08-28 DIAGNOSIS — I5022 Chronic systolic (congestive) heart failure: Secondary | ICD-10-CM | POA: Diagnosis not present

## 2016-08-28 MED ORDER — CARVEDILOL 12.5 MG PO TABS: 12.5000 mg | ORAL_TABLET | Freq: Two times a day (BID) | ORAL | 3 refills | Status: DC

## 2016-08-28 NOTE — Patient Instructions (Signed)
Medication Instructions:  INCREASE COREG TO 12.5 MG TWO TIMES DAILY   Labwork: NONE  Testing/Procedures: NONE  Follow-Up: Your physician recommends that you schedule a follow-up appointment in: 2 WEEK NURSE VISIT Your physician recommends that you schedule a follow-up appointment in: 2 MONTHS     Any Other Special Instructions Will Be Listed Below (If Applicable).  You have been referred to CARDIAC REHAB You have been referred to NUTRITION FOR DIABETES CONTROL & DIABETES      If you need a refill on your cardiac medications before your next appointment, please call your pharmacy.

## 2016-08-28 NOTE — Progress Notes (Signed)
Clinical Summary Mr. Yin is a 51 y.o.male seen today for follow up of the following medical problems.   1. CAD/ICM/Chronic systolic HF - history of anterior STEMI 02/2008, received DES to LAD. LVEF at that time was 35% - 2012 myoview anterior scar with mild ischemia - 06/2011 echo LVEF 45-50% - 06/2011 GXT no ischemia   - 07/2016 echo shows LVEF down to 30-35%, diffuse hypokinesis with akinesis of anerior and anteroseptal walls. Evidence of LV thrombus - with drop in LVEF and ongoing chest pain was referred for cath last visit 08/2016 cath: see report below, received DES to D2 99%. Ok to stop ASA once coumadin is therapeutic per interventional cardiology, continue plavix.   - still SOB at times. Mild chest pressure better than before  2. LV thrombus - noted on recent echo - he has started coumadin, INR not yet therapeutic.    Past Medical History:  Diagnosis Date  . Arthritis    in lower back  . Coronary artery disease    a. 02/2008: anterior STEMI s/p DES to prox LAD, b. Myoview 2012: scar in the anterior wall with mild ischemia and EF 33%, c. neg GXT 06/2011  d. 08/2016: cath with DES to diagonal   . Depression with anxiety   . Diabetes mellitus    TYPE II  . Fatty liver    H/O ELEVATED LIVER ENZYMES  . Hyperlipidemia   . Ischemic cardiomyopathy    a. Previous EF 33-35% 2012, b. improved to 45-50% by echo 06/2011  . LV (left ventricular) mural thrombus without MI   . Noncompliance    Prior hx of med noncompliance  . OSA (obstructive sleep apnea)      No Known Allergies   Current Outpatient Prescriptions  Medication Sig Dispense Refill  . aspirin EC 81 MG tablet Take 81 mg by mouth daily.    Marland Kitchen atorvastatin (LIPITOR) 80 MG tablet Take 1 tablet (80 mg total) by mouth daily. 90 tablet 1  . carvedilol (COREG) 6.25 MG tablet Take 1 tablet (6.25 mg total) by mouth 2 (two) times daily. 180 tablet 3  . clopidogrel (PLAVIX) 75 MG tablet Take 1 tablet (75 mg total) by mouth  daily with breakfast. 90 tablet 3  . liraglutide (VICTOZA) 18 MG/3ML SOPN Inject into the skin daily as needed. Takes if blood sugar is high    . lisinopril (PRINIVIL,ZESTRIL) 20 MG tablet Take 20 mg by mouth daily.    . metFORMIN (GLUCOPHAGE) 1000 MG tablet Take 1,000 mg by mouth 2 (two) times daily with a meal.    . Multiple Vitamins-Minerals (MULTIVITAMIN WITH MINERALS) tablet Take 1 tablet by mouth daily.    . nitroGLYCERIN (NITROSTAT) 0.4 MG SL tablet Place 0.4 mg under the tongue every 5 (five) minutes as needed for chest pain.    . sildenafil (REVATIO) 20 MG tablet Take 20 mg by mouth daily as needed.     . topiramate (TOPAMAX) 25 MG capsule Take 25 mg by mouth 2 (two) times daily.    Marland Kitchen warfarin (COUMADIN) 5 MG tablet Take 1 tablet (5 mg total) by mouth daily at 6 PM. 30 tablet 0   No current facility-administered medications for this visit.      Past Surgical History:  Procedure Laterality Date  . CORONARY STENT INTERVENTION N/A 08/21/2016   Procedure: CORONARY STENT INTERVENTION;  Surgeon: Kathleene Hazel, MD;  Location: [facility] INVASIVE CV LAB;  Service: Cardiovascular;  Laterality: N/A;  . INTRAVASCULAR PRESSURE  WIRE/FFR STUDY N/A 08/21/2016   Procedure: INTRAVASCULAR PRESSURE WIRE/FFR STUDY;  Surgeon: Kathleene Hazel, MD;  Location: [facility] INVASIVE CV LAB;  Service: Cardiovascular;  Laterality: N/A;  . LIVER BIOPSY    . MI WITH STENTS    . NASAL SINUS SURGERY    . RIGHT/LEFT HEART CATH AND CORONARY ANGIOGRAPHY N/A 08/21/2016   Procedure: Right/Left Heart Cath and Coronary Angiography;  Surgeon: Kathleene Hazel, MD;  Location: [facility] INVASIVE CV LAB;  Service: Cardiovascular;  Laterality: N/A;     No Known Allergies    Family History  Problem Relation Age of Onset  . Coronary artery disease Unknown        UNKNOWN  . Heart disease Father        CAD s/p CABG  . Diabetes Mellitus II Father   . Diabetes Mellitus II Mother   . Cancer Brother      Social  History Mr. Casalino reports that he has never smoked. He has never used smokeless tobacco. Mr. Mohs reports that he does not drink alcohol.   Review of Systems CONSTITUTIONAL: No weight loss, fever, chills, weakness or fatigue.  HEENT: Eyes: No visual loss, blurred vision, double vision or yellow sclerae.No hearing loss, sneezing, congestion, runny nose or sore throat.  SKIN: No rash or itching.  CARDIOVASCULAR: per hpi RESPIRATORY: per hpi  GASTROINTESTINAL: No anorexia, nausea, vomiting or diarrhea. No abdominal pain or blood.  GENITOURINARY: No burning on urination, no polyuria NEUROLOGICAL: No headache, dizziness, syncope, paralysis, ataxia, numbness or tingling in the extremities. No change in bowel or bladder control.  MUSCULOSKELETAL: No muscle, back pain, joint pain or stiffness.  LYMPHATICS: No enlarged nodes. No history of splenectomy.  PSYCHIATRIC: No history of depression or anxiety.  ENDOCRINOLOGIC: No reports of sweating, cold or heat intolerance. No polyuria or polydipsia.  Marland Kitchen   Physical Examination Vitals:   08/28/16 1355  BP: 122/60  Pulse: 82  SpO2: 98%   Vitals:   08/28/16 1355  Weight: 229 lb (103.9 kg)  Height:  (1.778 m)    Gen: resting comfortably, no acute distress HEENT: no scleral icterus, pupils equal round and reactive, no palptable cervical adenopathy,  CV: RRR, no m/r/g, no jvd Resp: Clear to auscultation bilaterally GI: abdomen is soft, non-tender, non-distended, normal bowel sounds, no hepatosplenomegaly MSK: extremities are warm, no edema.  Skin: warm, no rash Neuro:  no focal deficits Psych: appropriate affect   Diagnostic Studies 6./2013 echo Study Conclusions  - Left ventricle: Systolic function was mildly reduced. The estimated ejection fraction was in the range of 45% to 50%. - Left atrium: The atrium was mildly dilated.  08/07/16 echo Study Conclusions  - Left ventricle: LV systolic function is depressed  at approximately 30 to 35% with severe hypokinesis/akinesis of the inferior, septal, distal lateral and apical walls. Cannot exclude mural thrombus at apex. Suggest limited echo with Definity to define The cavity size was normal. Wall thickness was normal. - Mitral valve: There was mild regurgitation. - Left atrium: The atrium was mildly dilated.  08/14/16 echo Study Conclusions  - Procedure narrative: Transthoracic echocardiography. Image quality was adequate. The study was technically difficult. Intravenous contrast (Definity) was administered. - Left ventricle: Systolic function was moderately to severely reduced. The estimated ejection fraction was in the range of 30% to 35%. Diffuse hypokinesis. There is akinesis of the anteroseptal, anterior, and apical myocardium. - Right ventricle: Systolic function was normal.  Impressions:  - Limited study with Definity contrast. LVEF is in  the range of 30-35%. There is a loosely organized LV apical thrombus noted. Diffuse hypokinesis overall with akinesis of the anterior, anteroseptal, and apical myocardium. RV contraction normal based on limited views.  08/2016 cath  Mid RCA-2 lesion, 30 %stenosed.  Mid RCA-1 lesion, 30 %stenosed.  Ost Ramus to Ramus lesion, 20 %stenosed.  Ost LAD to Prox LAD lesion, 10 %stenosed.  Prox LAD to Mid LAD lesion, 60 %stenosed.  A STENT SYNERGY DES 2.25X12 drug eluting stent was successfully placed.  2nd Diag lesion, 99 %stenosed.  Post intervention, there is a 0% residual stenosis.   1. Patent stent proximal LAD 2. Moderate stenosis mid LAD. FFR was 0.84 suggesting the stenosis was not flow limiting.  3. The Diagonal Y[place]kel K[place]k is a moderate caliber vessel with a 99% stenosis.  4. Successful PTCA/DES x 1 Diagonal Trayce Maino 5. Mild disease RCA  Recommendations: Will continue ASA and Plavix for now. He will need to be started on coumadin tomorrow before discharge for his  LV thrombus. He can stop ASA when his INR is therapeutic on coumadin since he was not an ACS. I would plan long term coumadin for LV thrombus and Plavix for at least one year given placement of DES. Continue statin and beta blocker.   Assessment and Plan  1. CAD/ICM/chronic systolic HF - we will increase coreg to 12.5mg  bid. Nursing visit in 2 weeks, if stable vitals then further titration - refer to cardiac rehab - recent stent, continue DAPT and coumadin, stop ASA once INR is therapeutic   2. LV thrombus - noted on recent echo - conitnue coumadin   3. DM2/Obesity - refer to nutrition  Antoine Poche, M.D

## 2016-08-31 ENCOUNTER — Ambulatory Visit (INDEPENDENT_AMBULATORY_CARE_PROVIDER_SITE_OTHER): Payer: 59 | Admitting: *Deleted

## 2016-08-31 DIAGNOSIS — Z5181 Encounter for therapeutic drug level monitoring: Secondary | ICD-10-CM

## 2016-08-31 DIAGNOSIS — I240 Acute coronary thrombosis not resulting in myocardial infarction: Secondary | ICD-10-CM

## 2016-08-31 DIAGNOSIS — I513 Intracardiac thrombosis, not elsewhere classified: Secondary | ICD-10-CM | POA: Diagnosis not present

## 2016-08-31 LAB — POCT INR: INR: 1

## 2016-09-03 ENCOUNTER — Ambulatory Visit (INDEPENDENT_AMBULATORY_CARE_PROVIDER_SITE_OTHER): Payer: 59 | Admitting: *Deleted

## 2016-09-03 DIAGNOSIS — I513 Intracardiac thrombosis, not elsewhere classified: Secondary | ICD-10-CM

## 2016-09-03 DIAGNOSIS — Z5181 Encounter for therapeutic drug level monitoring: Secondary | ICD-10-CM | POA: Diagnosis not present

## 2016-09-03 DIAGNOSIS — I240 Acute coronary thrombosis not resulting in myocardial infarction: Secondary | ICD-10-CM

## 2016-09-03 LAB — POCT INR: INR: 1.2

## 2016-09-08 ENCOUNTER — Ambulatory Visit (INDEPENDENT_AMBULATORY_CARE_PROVIDER_SITE_OTHER): Payer: 59 | Admitting: *Deleted

## 2016-09-08 DIAGNOSIS — I513 Intracardiac thrombosis, not elsewhere classified: Secondary | ICD-10-CM

## 2016-09-08 DIAGNOSIS — Z5181 Encounter for therapeutic drug level monitoring: Secondary | ICD-10-CM

## 2016-09-08 DIAGNOSIS — I240 Acute coronary thrombosis not resulting in myocardial infarction: Secondary | ICD-10-CM

## 2016-09-08 LAB — POCT INR: INR: 1.2

## 2016-09-08 MED ORDER — WARFARIN SODIUM 10 MG PO TABS: ORAL_TABLET | ORAL | 3 refills | Status: DC

## 2016-09-11 ENCOUNTER — Ambulatory Visit (INDEPENDENT_AMBULATORY_CARE_PROVIDER_SITE_OTHER): Payer: 59 | Admitting: *Deleted

## 2016-09-11 VITALS — BP 128/86 | HR 89

## 2016-09-11 DIAGNOSIS — I10 Essential (primary) hypertension: Secondary | ICD-10-CM | POA: Diagnosis not present

## 2016-09-11 NOTE — Progress Notes (Signed)
Pt states that he is still getting tired when he walks. Pt reports starting to feel dizzy around midday. He states that he feels his blood glucose is low during this time. He eats at that time and feels better. Pt also reports headache 4/10 for the last 3 days no other complaints at this time. Please advise.

## 2016-09-15 ENCOUNTER — Ambulatory Visit (INDEPENDENT_AMBULATORY_CARE_PROVIDER_SITE_OTHER): Payer: 59 | Admitting: *Deleted

## 2016-09-15 DIAGNOSIS — Z5181 Encounter for therapeutic drug level monitoring: Secondary | ICD-10-CM | POA: Diagnosis not present

## 2016-09-15 DIAGNOSIS — I513 Intracardiac thrombosis, not elsewhere classified: Secondary | ICD-10-CM | POA: Diagnosis not present

## 2016-09-15 DIAGNOSIS — I240 Acute coronary thrombosis not resulting in myocardial infarction: Secondary | ICD-10-CM

## 2016-09-15 LAB — POCT INR: INR: 1.5

## 2016-09-17 ENCOUNTER — Ambulatory Visit: Payer: 59 | Admitting: Cardiology

## 2016-09-22 ENCOUNTER — Ambulatory Visit (INDEPENDENT_AMBULATORY_CARE_PROVIDER_SITE_OTHER): Payer: 59 | Admitting: *Deleted

## 2016-09-22 DIAGNOSIS — Z5181 Encounter for therapeutic drug level monitoring: Secondary | ICD-10-CM | POA: Diagnosis not present

## 2016-09-22 DIAGNOSIS — I513 Intracardiac thrombosis, not elsewhere classified: Secondary | ICD-10-CM

## 2016-09-22 DIAGNOSIS — I240 Acute coronary thrombosis not resulting in myocardial infarction: Secondary | ICD-10-CM

## 2016-09-22 LAB — POCT INR: INR: 2.8

## 2016-09-29 ENCOUNTER — Ambulatory Visit (INDEPENDENT_AMBULATORY_CARE_PROVIDER_SITE_OTHER): Payer: Self-pay | Admitting: *Deleted

## 2016-09-29 DIAGNOSIS — I240 Acute coronary thrombosis not resulting in myocardial infarction: Secondary | ICD-10-CM

## 2016-09-29 DIAGNOSIS — Z5181 Encounter for therapeutic drug level monitoring: Secondary | ICD-10-CM

## 2016-09-29 DIAGNOSIS — I513 Intracardiac thrombosis, not elsewhere classified: Secondary | ICD-10-CM

## 2016-09-29 LAB — POCT INR: INR: 2.6

## 2016-10-10 ENCOUNTER — Emergency Department (HOSPITAL_COMMUNITY): Payer: Self-pay

## 2016-10-10 ENCOUNTER — Emergency Department (HOSPITAL_COMMUNITY)
Admission: EM | Admit: 2016-10-10 | Discharge: 2016-10-10 | Disposition: A | Discharge status: Home / Self Care | Payer: Self-pay | Attending: Emergency Medicine | Admitting: Emergency Medicine

## 2016-10-10 DIAGNOSIS — Z7982 Long term (current) use of aspirin: Secondary | ICD-10-CM | POA: Insufficient documentation

## 2016-10-10 DIAGNOSIS — Z79899 Other long term (current) drug therapy: Secondary | ICD-10-CM | POA: Insufficient documentation

## 2016-10-10 DIAGNOSIS — M5431 Sciatica, right side: Secondary | ICD-10-CM | POA: Insufficient documentation

## 2016-10-10 DIAGNOSIS — I2510 Atherosclerotic heart disease of native coronary artery without angina pectoris: Secondary | ICD-10-CM | POA: Insufficient documentation

## 2016-10-10 DIAGNOSIS — Z7901 Long term (current) use of anticoagulants: Secondary | ICD-10-CM | POA: Insufficient documentation

## 2016-10-10 DIAGNOSIS — E119 Type 2 diabetes mellitus without complications: Secondary | ICD-10-CM | POA: Insufficient documentation

## 2016-10-10 DIAGNOSIS — Z7902 Long term (current) use of antithrombotics/antiplatelets: Secondary | ICD-10-CM | POA: Insufficient documentation

## 2016-10-10 DIAGNOSIS — R52 Pain, unspecified: Secondary | ICD-10-CM

## 2016-10-10 DIAGNOSIS — Z955 Presence of coronary angioplasty implant and graft: Secondary | ICD-10-CM | POA: Insufficient documentation

## 2016-10-10 DIAGNOSIS — Z7984 Long term (current) use of oral hypoglycemic drugs: Secondary | ICD-10-CM | POA: Insufficient documentation

## 2016-10-10 DIAGNOSIS — I10 Essential (primary) hypertension: Secondary | ICD-10-CM | POA: Insufficient documentation

## 2016-10-10 LAB — PROTIME-INR
INR: 3.01
Prothrombin Time: 31 seconds — ABNORMAL HIGH (ref 11.4–15.2)

## 2016-10-10 MED ORDER — ORPHENADRINE CITRATE ER 100 MG PO TB12: 100.0000 mg | ORAL_TABLET | Freq: Two times a day (BID) | ORAL | 0 refills | Status: DC

## 2016-10-10 MED ORDER — MORPHINE SULFATE (PF) 4 MG/ML IV SOLN
4.0000 mg | Freq: Once | INTRAVENOUS | Status: AC
  Administered 2016-10-10: 4 mg via INTRAVENOUS
  Filled 2016-10-10: qty 1

## 2016-10-10 MED ORDER — OXYCODONE-ACETAMINOPHEN 5-325 MG PO TABS: 1.0000 | ORAL_TABLET | ORAL | 0 refills | Status: DC | PRN

## 2016-10-10 NOTE — Discharge Instructions (Signed)
Return if pain is getting worse, or if you start having problems with your bowels or bladder.

## 2016-10-10 NOTE — ED Provider Notes (Signed)
[facility] DEPT Provider Note   CSN: [id number] Arrival date & time: 10/10/16  0431     History   Chief Complaint Chief Complaint  Patient presents with  . Leg Pain    Right    HPI [name] [name] is a 51 y.o. male.  The history is provided by the patient.  Leg Pain    He complains of pain in the posterior aspect of the right hip for the last 2 days which is getting worse. Pain is a dull, ache which is worse when he stands. He was unable to on it tonight because of pain. Pain sometimes radiates down the thigh but it's not radiated to the back. There is no weakness, numbness, telling. There is no bowel or bladder dysfunction. There is no saddle anesthesia. Denies any recent, or overuse. He has used topical lidocaine and acetaminophen without relief. Of note, he is anticoagulated on warfarin because of a mural thrombus. He states he does have history of 2 bulging disks in his back which were diagnosed with MRI 20 years ago.  Past Medical History:  Diagnosis Date  . Arthritis    in lower back  . Coronary artery disease    a. 02/2008: anterior STEMI s/p DES to prox LAD, b. Myoview 2012: scar in the anterior wall with mild ischemia and EF 33%, c. neg GXT 06/2011  d. 08/2016: cath with DES to diagonal   . Depression with anxiety   . Diabetes mellitus    TYPE II  . Fatty liver    H/O ELEVATED LIVER ENZYMES  . Hyperlipidemia   . Ischemic cardiomyopathy    a. Previous EF 33-35% 2012, b. improved to 45-50% by echo 06/2011  . LV (left ventricular) mural thrombus without MI   . Noncompliance    Prior hx of med noncompliance  . OSA (obstructive sleep apnea)     Patient Active Problem List   Diagnosis Date Noted  . Encounter for therapeutic drug monitoring 08/24/2016  . LV (left ventricular) mural thrombus without MI   . Type 2 diabetes mellitus with complication (HCC)   . OSA (obstructive sleep apnea)   . Unstable angina (HCC)   . Abnormal LFTs (liver function tests) 07/07/2011  .  Cardiomyopathy, ischemic 04/24/2010  . Mixed hyperlipidemia 09/27/2008  . Essential hypertension, benign 09/27/2008  . CORONARY ATHEROSCLEROSIS NATIVE CORONARY ARTERY 09/27/2008    Past Surgical History:  Procedure Laterality Date  . CORONARY STENT INTERVENTION N/A 08/21/2016   Procedure: CORONARY STENT INTERVENTION;  Surgeon: Kathleene Hazel, MD;  Location: [facility] INVASIVE CV LAB;  Service: Cardiovascular;  Laterality: N/A;  . INTRAVASCULAR PRESSURE WIRE/FFR STUDY N/A 08/21/2016   Procedure: INTRAVASCULAR PRESSURE WIRE/FFR STUDY;  Surgeon: Kathleene Hazel, MD;  Location: [facility] INVASIVE CV LAB;  Service: Cardiovascular;  Laterality: N/A;  . LIVER BIOPSY    . MI WITH STENTS    . NASAL SINUS SURGERY    . RIGHT/LEFT HEART CATH AND CORONARY ANGIOGRAPHY N/A 08/21/2016   Procedure: Right/Left Heart Cath and Coronary Angiography;  Surgeon: Kathleene Hazel, MD;  Location: [facility] INVASIVE CV LAB;  Service: Cardiovascular;  Laterality: N/A;       Home Medications    Prior to Admission medications   Medication Sig Start Date End Date Taking? Authorizing Provider  aspirin EC 81 MG tablet Take 81 mg by mouth daily.   Yes [provider]  atorvastatin (LIPITOR) 80 MG tablet Take 1 tablet (80 mg total) by mouth daily. 08/06/16 11/04/16 Yes  Antoine Poche, MD  carvedilol (COREG) 12.5 MG tablet Take 1 tablet (12.5 mg total) by mouth 2 (two) times daily. 08/28/16 11/26/16 Yes BranchDorothe Pea, MD  clopidogrel (PLAVIX) 75 MG tablet Take 1 tablet (75 mg total) by mouth daily with breakfast. 08/23/16  Yes Janetta Hora, PA-C  lisinopril (PRINIVIL,ZESTRIL) 20 MG tablet Take 20 mg by mouth daily.   Yes [provider]  metFORMIN (GLUCOPHAGE) 1000 MG tablet Take 1,000 mg by mouth 2 (two) times daily with a meal.   Yes [provider]  Multiple Vitamins-Minerals (MULTIVITAMIN WITH MINERALS) tablet Take 1 tablet by mouth daily.   Yes [provider]    sildenafil (REVATIO) 20 MG tablet Take 20 mg by mouth daily as needed.    Yes [provider]  topiramate (TOPAMAX) 25 MG capsule Take 25 mg by mouth 2 (two) times daily.   Yes [provider]  warfarin (COUMADIN) 10 MG tablet Take 1 1/2 tablets tonight then take 1 tablet daily except 1 1/2 tablets on Mondays, Wednesdays and Fridays or as directed 09/08/16  Yes Branch, Dorothe Pea, MD  nitroGLYCERIN (NITROSTAT) 0.4 MG SL tablet Place 0.4 mg under the tongue every 5 (five) minutes as needed for chest pain.    [provider]    Family History Family History  Problem Relation Age of Onset  . Coronary artery disease Unknown        UNKNOWN  . Heart disease Father        CAD s/p CABG  . Diabetes Mellitus II Father   . Diabetes Mellitus II Mother   . Cancer Brother     Social History Social History  Substance Use Topics  . Smoking status: Never Smoker  . Smokeless tobacco: Never Used  . Alcohol use No     Allergies   Patient has no known allergies.   Review of Systems Review of Systems  All other systems reviewed and are negative.    Physical Exam Updated Vital Signs BP (!) 159/104 (BP Location: Left Arm)   Pulse 86   Temp 98.6 F (37 C) (Oral)   Resp 18   Ht 5\' 10"  (1.778 m)   Wt 106.6 kg (235 lb)   SpO2 95%   BMI 33.72 kg/m   Physical Exam  Nursing note and vitals reviewed.  51 year old male, resting comfortably and in no acute distress. Vital signs are significant for hypertension. Oxygen saturation is 95%, which is normal. Head is normocephalic and atraumatic. PERRLA, EOMI. Oropharynx is clear. Neck is nontender and supple without adenopathy or JVD. Back is nontender and there is no CVA tenderness. Lungs are clear without rales, wheezes, or rhonchi. Chest is nontender. Heart has regular rate and rhythm without murmur. Abdomen is soft, flat, nontender without masses or hepatosplenomegaly and peristalsis is normoactive. Extremities:  There is moderate tenderness in the posterior aspect of the left hip. There is pain on flexion past 90 the right hip, but there is also a positive straight leg raise on the right at 45. Distal neurovascular exam is intact with strong pulses and prompt capillary refill Skin is warm and dry without rash. Neurologic: Mental status is normal, cranial nerves are intact, there are no motor or sensory deficits.  ED Treatments / Results  Labs (all labs ordered are listed, but only abnormal results are displayed) Labs Reviewed  PROTIME-INR - Abnormal; Notable for the following:       Result Value   Prothrombin Time  31.0 (*)    All other components within normal limits   Radiology Dg Hip Unilat With Pelvis 2-3 Views Right  Result Date: 10/10/2016 CLINICAL DATA:  Progressive pain in the right leg and hip over the past 2 weeks. Difficulty walking. No injury. EXAM: DG HIP (WITH OR WITHOUT PELVIS) 2-3V RIGHT COMPARISON:  None. FINDINGS: Mild degenerative changes in the hips with hypertrophic changes off the superior acetabular rim. No evidence of acute fracture or dislocation in the pelvis or right hip. No focal bone lesion or bone destruction. SI joints and symphysis pubis are not displaced. Soft tissues are unremarkable. IMPRESSION: Mild degenerative changes in the right hip. No acute bony abnormalities. Electronically Signed   By: Burman Nieves M.D.   On: 10/10/2016 05:18    Procedures Procedures (including critical care time)  Medications Ordered in ED Medications  morphine 4 MG/ML injection 4 mg (4 mg Intravenous Given 10/10/16 0523)     Initial Impression / Assessment and Plan / ED Course  I have reviewed the triage vital signs and the nursing notes.  Pertinent labs & imaging results that were available during my care of the patient were reviewed by me and considered in my medical decision making (see chart for details).  Right hip pain which seems most likely to be sciatica. No history  of,, but could have bursitis in the hip. Old records reviewed, and there are no relevant past visits. He cannot be given NSAIDs because he is anticoagulated on warfarin. Last INR was checked 10 days ago. Will recheck today and sent for plain x-rays. He is given dose of morphine for pain.  He had good relief of pain with above noted medication. X-rays showed no acute bony problem. He is discharged with prescription for oxycodone have acetaminophen and orphenadrine. Follow-up with PCP. Consider MRI scan if no response to conservative therapy for 4-6 weeks. Return precautions discussed.  Final Clinical Impressions(s) / ED Diagnoses   Final diagnoses:  Sciatica of right side  Anticoagulated on warfarin    New Prescriptions New Prescriptions   ORPHENADRINE (NORFLEX) 100 MG TABLET    Take 1 tablet (100 mg total) by mouth 2 (two) times daily.   OXYCODONE-ACETAMINOPHEN (PERCOCET) 5-325 MG TABLET    Take 1 tablet by mouth every 4 (four) hours as needed for moderate pain.     Dione Booze, MD 10/10/16 [id number]

## 2016-10-10 NOTE — ED Triage Notes (Signed)
Pt notes progressive pain in Right leg/hip/buttocks over the past two weeks.  Pt states he is having difficulty walking.  "feels like I have fell on it but have not had a fall".

## 2016-10-13 ENCOUNTER — Encounter: Payer: Self-pay | Admitting: Nutrition

## 2016-10-13 ENCOUNTER — Ambulatory Visit (INDEPENDENT_AMBULATORY_CARE_PROVIDER_SITE_OTHER): Payer: Self-pay | Admitting: *Deleted

## 2016-10-13 ENCOUNTER — Encounter: Payer: Self-pay | Attending: Cardiology | Admitting: Nutrition

## 2016-10-13 VITALS — Ht 70.0 in | Wt 237.0 lb

## 2016-10-13 DIAGNOSIS — I252 Old myocardial infarction: Secondary | ICD-10-CM

## 2016-10-13 DIAGNOSIS — Z5181 Encounter for therapeutic drug level monitoring: Secondary | ICD-10-CM

## 2016-10-13 DIAGNOSIS — E118 Type 2 diabetes mellitus with unspecified complications: Principal | ICD-10-CM

## 2016-10-13 DIAGNOSIS — I240 Acute coronary thrombosis not resulting in myocardial infarction: Secondary | ICD-10-CM

## 2016-10-13 DIAGNOSIS — E669 Obesity, unspecified: Secondary | ICD-10-CM

## 2016-10-13 DIAGNOSIS — E1165 Type 2 diabetes mellitus with hyperglycemia: Secondary | ICD-10-CM

## 2016-10-13 DIAGNOSIS — I513 Intracardiac thrombosis, not elsewhere classified: Secondary | ICD-10-CM

## 2016-10-13 DIAGNOSIS — IMO0002 Reserved for concepts with insufficient information to code with codable children: Secondary | ICD-10-CM

## 2016-10-13 LAB — POCT INR: INR: 3.6

## 2016-10-13 NOTE — Progress Notes (Signed)
  Medical Nutrition Therapy:  Appt start time: 1430 end time:  1530.  Assessment:  Primary concerns today: Diabetes, Obesity and Hyperlipidemia. Had 1 heart attack and recently had a stent put in August 2018. PMH DM Type 2.. Doesn't have a meter to test blood sugars. He is willing to buy one from [facility] and test twice a day. Lives with his mom who helps cook. He is on Disability. He admits his mom has a lot of sweets in the house. A1C 8.5%. Metformin 1000 mg BID  Steglato 5 mg/d.Marland Kitchen Has been a diabetic 10+ years. He is willing to work on changing lifestyle behaviors to improve blood sugars.   Preferred Learning Style:  Seeing or hear.  Hands on     Ready  Change in progress   MEDICATIONS: See chrart   DIETARY INTAKE:   24-hr recall:  B ( AM): 2 eggs, sausage and bacon or oatmeal, Dt Soda or flavored water Snk ( AM):  misc L ( PM): ham and cheese omelet, Diet soda, unsweet tea Snk ( PM): misc; prestzels, cheese D ( PM): Hot dogs 2 Snk ( PM):  Beverages: water, diet sodas  Usual physical activity: ADL  Estimated energy needs: 1800  calories 200 g carbohydrates 135 g protein 50 g fat  Progress Towards Goal(s):  In progress.   Nutritional Diagnosis:  NB-1.1 Food and nutrition-related knowledge deficit As related to Diabetes.  As evidenced by A1C 8.5%.    Intervention:  Nutrition and Diabetes education provided on My Plate, CHO counting, meal planning, portion sizes, timing of meals, avoiding snacks between meals unless having a low blood sugar, target ranges for A1C and blood sugars, signs/symptoms and treatment of hyper/hypoglycemia, monitoring blood sugars, taking medications as prescribed, benefits of exercising 30 minutes per day and prevention of complications of DM. Goals 1 Follow My Plate 2. Cut out sweets and junk food 3. Eat three meals per day at times discussed 4. Do not skip meals 5. Drink only water 6. Walk 30 minutes or more a day Test blood sugars  twice a day-get Meter at [facility] Get A1C down to 7% or less. Lose 1-2 lbs per week.  Teaching Method Utilized:  Visual Auditory Hands on  Handouts given during visit include:  The Plate Method   Meal Plan Card  Diabetes Instructions.  HIgh Fiber Diet s/p Stent placment  Barriers to learning/adherence to lifestyle change:  none  Demonstrated degree of understanding via:  Teach Back   Monitoring/Evaluation:  Dietary intake, exercise, meal planning, and body weight in 1 month.

## 2016-10-13 NOTE — Patient Instructions (Addendum)
Goals 1 Follow My Plate 2. Cut out sweets and junk food 3. Eat three meals per day at times discussed 4. Do not skip meals 5. Drink only water 6. Walk 30 minutes or more a day Test blood sugars twice a day-get Meter at [facility] Get A1C down to 7% or less. Lose 1-2 lbs per week.

## 2016-10-27 ENCOUNTER — Ambulatory Visit (INDEPENDENT_AMBULATORY_CARE_PROVIDER_SITE_OTHER): Payer: Self-pay | Admitting: *Deleted

## 2016-10-27 DIAGNOSIS — I240 Acute coronary thrombosis not resulting in myocardial infarction: Secondary | ICD-10-CM

## 2016-10-27 DIAGNOSIS — I513 Intracardiac thrombosis, not elsewhere classified: Secondary | ICD-10-CM

## 2016-10-27 DIAGNOSIS — Z5181 Encounter for therapeutic drug level monitoring: Secondary | ICD-10-CM

## 2016-10-27 LAB — POCT INR: INR: 1.4

## 2016-11-03 ENCOUNTER — Encounter: Payer: Self-pay | Admitting: Cardiology

## 2016-11-03 ENCOUNTER — Ambulatory Visit (INDEPENDENT_AMBULATORY_CARE_PROVIDER_SITE_OTHER): Payer: Self-pay | Admitting: *Deleted

## 2016-11-03 ENCOUNTER — Ambulatory Visit (INDEPENDENT_AMBULATORY_CARE_PROVIDER_SITE_OTHER): Payer: Self-pay | Admitting: Cardiology

## 2016-11-03 VITALS — BP 126/78 | HR 73 | Ht 70.0 in | Wt 239.0 lb

## 2016-11-03 DIAGNOSIS — I5022 Chronic systolic (congestive) heart failure: Secondary | ICD-10-CM

## 2016-11-03 DIAGNOSIS — I2510 Atherosclerotic heart disease of native coronary artery without angina pectoris: Secondary | ICD-10-CM

## 2016-11-03 DIAGNOSIS — I240 Acute coronary thrombosis not resulting in myocardial infarction: Secondary | ICD-10-CM

## 2016-11-03 DIAGNOSIS — I513 Intracardiac thrombosis, not elsewhere classified: Secondary | ICD-10-CM

## 2016-11-03 DIAGNOSIS — R079 Chest pain, unspecified: Secondary | ICD-10-CM

## 2016-11-03 DIAGNOSIS — Z5181 Encounter for therapeutic drug level monitoring: Secondary | ICD-10-CM

## 2016-11-03 LAB — POCT INR: INR: 2.3

## 2016-11-03 NOTE — Progress Notes (Signed)
Clinical Summary [name] is a 50 y.o.male seen today for follow up of the following medical problems.   1. CAD/ICM/Chronic systolic HF - history of anterior STEMI 02/2008, received DES to LAD. LVEF at that time was 35% - 2012 myoview anterior scar with mild ischemia - 06/2011 echo LVEF 45-50% - 06/2011 GXT no ischemia   - 07/2016 echo shows LVEF down to 30-35%, diffuse hypokinesis with akinesis of anerior and anteroseptal walls. Evidence of LV thrombus - with drop in LVEF and ongoing chest pain was referred for cath last visit 08/2016 cath: see report below, received DES to D2 99%. Ok to stop ASA once coumadin is therapeutic per interventional cardiology, continue plavix.    - occasional chest pain at rest. Throbbing pain mid to left chest, 1/10 in severity. Mild nausea. Lasts 10-15 minutes. Occurs sporadically. Can be worst with deep breaths. Often comes on with stress.  - orthostatic symptoms with standing which is improving.  - cannot afford cardiac rehab.   2. LV thrombus - noted on recent echo - he has started coumadin, had not been therapeutic.   - no bleeding issues on coumadin   Past Medical History:  Diagnosis Date  . Arthritis    in lower back  . Coronary artery disease    a. 02/2008: anterior STEMI s/p DES to prox LAD, b. Myoview 2012: scar in the anterior wall with mild ischemia and EF 33%, c. neg GXT 06/2011  d. 08/2016: cath with DES to diagonal   . Depression with anxiety   . Diabetes mellitus    TYPE II  . Fatty liver    H/O ELEVATED LIVER ENZYMES  . Hyperlipidemia   . Ischemic cardiomyopathy    a. Previous EF 33-35% 2012, b. improved to 45-50% by echo 06/2011  . LV (left ventricular) mural thrombus without MI   . Noncompliance    Prior hx of med noncompliance  . OSA (obstructive sleep apnea)      No Known Allergies   Current Outpatient Prescriptions  Medication Sig Dispense Refill  . aspirin EC 81 MG tablet Take 81 mg by mouth daily.    Marland Kitchen  atorvastatin (LIPITOR) 80 MG tablet Take 1 tablet (80 mg total) by mouth daily. 90 tablet 1  . carvedilol (COREG) 12.5 MG tablet Take 1 tablet (12.5 mg total) by mouth 2 (two) times daily. 180 tablet 3  . clopidogrel (PLAVIX) 75 MG tablet Take 1 tablet (75 mg total) by mouth daily with breakfast. 90 tablet 3  . Ertugliflozin L-PyroglutamicAc (STEGLATRO) 5 MG TABS Take 5 mg by mouth.    Marland Kitchen lisinopril (PRINIVIL,ZESTRIL) 20 MG tablet Take 20 mg by mouth daily.    . metFORMIN (GLUCOPHAGE) 1000 MG tablet Take 1,000 mg by mouth 2 (two) times daily with a meal.    . Multiple Vitamins-Minerals (MULTIVITAMIN WITH MINERALS) tablet Take 1 tablet by mouth daily.    . nitroGLYCERIN (NITROSTAT) 0.4 MG SL tablet Place 0.4 mg under the tongue every 5 (five) minutes as needed for chest pain.    . orphenadrine (NORFLEX) 100 MG tablet Take 1 tablet (100 mg total) by mouth 2 (two) times daily. 30 tablet 0  . oxyCODONE-acetaminophen (PERCOCET) 5-325 MG tablet Take 1 tablet by mouth every 4 (four) hours as needed for moderate pain. 20 tablet 0  . sildenafil (REVATIO) 20 MG tablet Take 20 mg by mouth daily as needed.     . topiramate (TOPAMAX) 25 MG capsule Take 25 mg by  mouth 2 (two) times daily.    Marland Kitchen warfarin (COUMADIN) 10 MG tablet Take 1 1/2 tablets tonight then take 1 tablet daily except 1 1/2 tablets on Mondays, Wednesdays and Fridays or as directed 45 tablet 3   No current facility-administered medications for this visit.      Past Surgical History:  Procedure Laterality Date  . CORONARY STENT INTERVENTION N/A 08/21/2016   Procedure: CORONARY STENT INTERVENTION;  Surgeon: Kathleene Hazel, MD;  Location: [facility] INVASIVE CV LAB;  Service: Cardiovascular;  Laterality: N/A;  . INTRAVASCULAR PRESSURE WIRE/FFR STUDY N/A 08/21/2016   Procedure: INTRAVASCULAR PRESSURE WIRE/FFR STUDY;  Surgeon: Kathleene Hazel, MD;  Location: [facility] INVASIVE CV LAB;  Service: Cardiovascular;  Laterality: N/A;  . LIVER BIOPSY    .  MI WITH STENTS    . NASAL SINUS SURGERY    . RIGHT/LEFT HEART CATH AND CORONARY ANGIOGRAPHY N/A 08/21/2016   Procedure: Right/Left Heart Cath and Coronary Angiography;  Surgeon: Kathleene Hazel, MD;  Location[facility] INVASIVE CV LAB;  Service: Cardiovascular;  Laterality: N/A;     No Known Allergies    Family History  Problem Relation Age of Onset  . Coronary artery disease Unknown        UNKNOWN  . Heart disease Father        CAD s/p CABG  . Diabetes Mellitus II Father   . Diabetes Mellitus II Mother   . Cancer Brother      Social History Mr. Asfaw reports that he has never smoked. He has never used smokeless tobacco. Mr. Tortorella reports that he does not drink alcohol.   Review of Systems CONSTITUTIONAL: No weight loss, fever, chills, weakness or fatigue.  HEENT: Eyes: No visual loss, blurred vision, double vision or yellow sclerae.No hearing loss, sneezing, congestion, runny nose or sore throat.  SKIN: No rash or itching.  CARDIOVASCULAR: per hpi RESPIRATORY: per hpi GASTROINTESTINAL: No anorexia, nausea, vomiting or diarrhea. No abdominal pain or blood.  GENITOURINARY: No burning on urination, no polyuria NEUROLOGICAL: No headache, dizziness, syncope, paralysis, ataxia, numbness or tingling in the extremities. No change in bowel or bladder control.  MUSCULOSKELETAL: No muscle, back pain, joint pain or stiffness.  LYMPHATICS: No enlarged nodes. No history of splenectomy.  PSYCHIATRIC: No history of depression or anxiety.  ENDOCRINOLOGIC: No reports of sweating, cold or heat intolerance. No polyuria or polydipsia.  Marland Kitchen   Physical Examination Vitals:   11/03/16 1414  BP: 126/78  Pulse: 73  SpO2: 97%   Vitals:   11/03/16 1414  Weight: 239 lb (108.4 kg)  Height:  (1.778 m)    Gen: resting comfortably, no acute distress HEENT: no scleral icterus, pupils equal round and reactive, no palptable cervical adenopathy,  CV: RRR, no m/r/g, no jvd Resp: Clear to  auscultation bilaterally GI: abdomen is soft, non-tender, non-distended, normal bowel sounds, no hepatosplenomegaly MSK: extremities are warm, no edema.  Skin: warm, no rash Neuro:  no focal deficits Psych: appropriate affect   Diagnostic Studies 6./2013 echo Study Conclusions  - Left ventricle: Systolic function was mildly reduced. The estimated ejection fraction was in the range of 45% to 50%. - Left atrium: The atrium was mildly dilated.  08/07/16 echo Study Conclusions  - Left ventricle: LV systolic function is depressed at approximately 30 to 35% with severe hypokinesis/akinesis of the inferior, septal, distal lateral and apical walls. Cannot exclude mural thrombus at apex. Suggest limited echo with Definity to define The cavity size was normal. Wall thickness was  normal. - Mitral valve: There was mild regurgitation. - Left atrium: The atrium was mildly dilated.  08/14/16 echo Study Conclusions  - Procedure narrative: Transthoracic echocardiography. Image quality was adequate. The study was technically difficult. Intravenous contrast (Definity) was administered. - Left ventricle: Systolic function was moderately to severely reduced. The estimated ejection fraction was in the range of 30% to 35%. Diffuse hypokinesis. There is akinesis of the anteroseptal, anterior, and apical myocardium. - Right ventricle: Systolic function was normal.  Impressions:  - Limited study with Definity contrast. LVEF is in the range of 30-35%. There is a loosely organized LV apical thrombus noted. Diffuse hypokinesis overall with akinesis of the anterior, anteroseptal, and apical myocardium. RV contraction normal based on limited views.  08/2016 cath  Mid RCA-2 lesion, 30 %stenosed.  Mid RCA-1 lesion, 30 %stenosed.  Ost Ramus to Ramus lesion, 20 %stenosed.  Ost LAD to Prox LAD lesion, 10 %stenosed.  Prox LAD to Mid LAD lesion, 60  %stenosed.  A STENT SYNERGY DES 2.25X12 drug eluting stent was successfully placed.  2nd Diag lesion, 99 %stenosed.  Post intervention, there is a 0% residual stenosis.  1. Patent stent proximal LAD 2. Moderate stenosis mid LAD. FFR was 0.84 suggesting the stenosis was not flow limiting.  3. The Diagonal Andris Brothers is a moderate caliber vessel with a 99% stenosis.  4. Successful PTCA/DES x 1 Diagonal Clorene Nerio 5. Mild disease RCA  Recommendations: Will continue ASA and Plavix for now. He will need to be started on coumadin tomorrow before discharge for his LV thrombus. He can stop ASA when his INR is therapeutic on coumadin since he was not an ACS. I would plan long term coumadin for LV thrombus and Plavix for at least one year given placement of DES. Continue statin and beta blocker.     Assessment and Plan  1. CAD/ICM/chronic systolic HF - medical therapy limited by orthostatic symptoms, continue current meds - stop ASA now that coumadin is therapeutic, continue plavix.  - infrequent nonspecific chest pain at rest, trial of zantac over the counter.   2. LV thrombus - noted on recent echo - INR is therapeutic, continue coumadin. Likely repeat echo in next few months, he has just become therapeutic with his INR.     F/u 6 weeks    Antoine Poche, M.D.

## 2016-11-03 NOTE — Patient Instructions (Addendum)
     Medication Instructions:  Start OTC ZANTAC 150 TWO TIMES DAILY  STOP ASPIRIN   Labwork: NONE  Testing/Procedures: NONE  Follow-Up: Your physician recommends that you schedule a follow-up appointment in: 6 WEEKS  Any Other Special Instructions Will Be Listed Below (If Applicable).     If you need a refill on your cardiac medications before your next appointment, please call your pharmacy.             Your INR today is 2.3  Per Vashti Hey ,RN, Coumadin Nurse : Stay on current dose of 10 mg daily, EXCEPT, take 15 mg on Tuesday,Thursday, and Saturday.  Follow up with [name] on 11/24/16 at 8:15 ma in the [facility] office

## 2016-11-12 ENCOUNTER — Ambulatory Visit: Payer: Self-pay | Admitting: Nutrition

## 2016-11-17 ENCOUNTER — Ambulatory Visit: Payer: Self-pay | Admitting: Nutrition

## 2016-11-26 ENCOUNTER — Ambulatory Visit (INDEPENDENT_AMBULATORY_CARE_PROVIDER_SITE_OTHER): Payer: Self-pay | Admitting: *Deleted

## 2016-11-26 DIAGNOSIS — Z5181 Encounter for therapeutic drug level monitoring: Secondary | ICD-10-CM

## 2016-11-26 DIAGNOSIS — I513 Intracardiac thrombosis, not elsewhere classified: Secondary | ICD-10-CM

## 2016-11-26 DIAGNOSIS — I240 Acute coronary thrombosis not resulting in myocardial infarction: Secondary | ICD-10-CM

## 2016-11-26 LAB — POCT INR: INR: 2.1

## 2016-12-14 NOTE — Progress Notes (Signed)
Cardiology Office Note   Date:  12/15/2016   ID:  [name] [name] L Berrocal, DOB 1965-03-20, MRN [id number]  PCP:  Samuel JesterButler, Cynthia, DO  Cardiologist: Dina RichJonathan Branch, MD    History of Present Illness: [name] EvesRobert L Haught is a 51 y.o. male who presents for ongoing assessment and management of coronary artery disease, with drug-eluting stent to the LAD in 2010 in the setting of anterior ST elevation MI, additionally drug-eluting stent to the diagonal 2 in August 2018 most recent echocardiogram revealing LVEF 30-35% per echocardiogram in July 2018, history of LV thrombus, now on Coumadin.  On last office visit with Dr. Wyline MoodBranch, dated 11/03/2016, noted that medical therapy for CAD and CHF was limited by orthostatic symptoms, no changes were made in medication regimen.  He is to have a follow-up echocardiogram to be ordered this visit.  The patient states he has continued complaints of dyspnea on exertion, generalized fatigue, he also has a lot of musculoskeletal pain in his neck and lower back.  The patient denies any fluid retention, chest pain, melena, hemoptysis or dizziness.  He is followed by his primary care for diabetes and has an appointment with a registered dietitian in a week.  He states that he tries to walk his dog between 30 and 45 minutes each day.  He states that he is trying to go to the gym and do some weight lifting but he is easily fatigued from this.  He states he has been tested for obstructive sleep apnea, but has been unable to tolerate CPAP due to sinus congestion and nasal abnormalities.  Past Medical History:  Diagnosis Date  . Arthritis    in lower back  . Coronary artery disease    a. 02/2008: anterior STEMI s/p DES to prox LAD, b. Myoview 2012: scar in the anterior wall with mild ischemia and EF 33%, c. neg GXT 06/2011  d. 08/2016: cath with DES to diagonal   . Depression with anxiety   . Diabetes mellitus    TYPE II  . Fatty liver    H/O ELEVATED LIVER ENZYMES  . Hyperlipidemia   .  Ischemic cardiomyopathy    a. Previous EF 33-35% 2012, b. improved to 45-50% by echo 06/2011  . LV (left ventricular) mural thrombus without MI   . Noncompliance    Prior hx of med noncompliance  . OSA (obstructive sleep apnea)     Past Surgical History:  Procedure Laterality Date  . CORONARY STENT INTERVENTION N/A 08/21/2016   Procedure: CORONARY STENT INTERVENTION;  Surgeon: Kathleene HazelMcAlhany, Christopher D, MD;  Location: [facility];  Service: Cardiovascular;  Laterality: N/A;  . INTRAVASCULAR PRESSURE WIRE/FFR STUDY N/A 08/21/2016   Procedure: INTRAVASCULAR PRESSURE WIRE/FFR STUDY;  Surgeon: Kathleene HazelMcAlhany, Christopher D, MD;  Location: [facility];  Service: Cardiovascular;  Laterality: N/A;  . LIVER BIOPSY    . MI WITH STENTS    . NASAL SINUS SURGERY    . RIGHT/LEFT HEART CATH AND CORONARY ANGIOGRAPHY N/A 08/21/2016   Procedure: Right/Left Heart Cath and Coronary Angiography;  Surgeon: Kathleene HazelMcAlhany, Christopher D, MD;  [facility];  Service: Cardiovascular;  Laterality: N/A;     Current Outpatient Medications  Medication Sig Dispense Refill  . atorvastatin (LIPITOR) 80 MG tablet Take 1 tablet (80 mg total) by mouth daily. 90 tablet 1  . carvedilol (COREG) 12.5 MG tablet Take 1 tablet (12.5 mg total) by mouth 2 (two) times daily. 180 tablet 3  . clopidogrel (PLAVIX) 75 MG tablet  Take 1 tablet (75 mg total) by mouth daily with breakfast. 90 tablet 3  . Ertugliflozin L-PyroglutamicAc (STEGLATRO) 5 MG TABS Take 5 mg by mouth.    Marland Kitchen lisinopril (PRINIVIL,ZESTRIL) 20 MG tablet Take 20 mg by mouth daily.    . metFORMIN (GLUCOPHAGE) 1000 MG tablet Take 1,000 mg by mouth 2 (two) times daily with a meal.    . nitroGLYCERIN (NITROSTAT) 0.4 MG SL tablet Place 0.4 mg under the tongue every 5 (five) minutes as needed for chest pain.    . sildenafil (REVATIO) 20 MG tablet Take 20 mg by mouth daily as needed.     . topiramate (TOPAMAX) 25 MG capsule Take 25 mg by mouth 2 (two) times daily.     Marland Kitchen warfarin (COUMADIN) 10 MG tablet Take 1 1/2 tablets tonight then take 1 tablet daily except 1 1/2 tablets on Mondays, Wednesdays and Fridays or as directed 45 tablet 3   No current facility-administered medications for this visit.     Allergies:   Patient has no known allergies.    Social History:  The patient  reports that  has never smoked. he has never used smokeless tobacco. He reports that he does not drink alcohol or use drugs.   Family History:  The patient's family history includes Cancer in his brother; Coronary artery disease in his unknown relative; Diabetes Mellitus II in his father and mother; Heart disease in his father.    ROS: All other systems are reviewed and negative. Unless otherwise mentioned in H&P    PHYSICAL EXAM: VS:  BP 126/86   Pulse 78   Ht 5\' 10"  (1.778 m)   Wt 240 lb (108.9 kg)   SpO2 94%   BMI 34.44 kg/m  , BMI Body mass index is 34.44 kg/m. GEN: Well nourished, well developed, in no acute distress  HEENT: normal  Neck: no JVD, carotid bruits, or masses Cardiac: RRR; no murmurs, rubs, or gallops,no edema  Respiratory:  Clear to auscultation bilaterally, normal work of breathing GI: soft, nontender, nondistended, + BS MS: no deformity or atrophy  Skin: warm and dry, no rash Neuro:  Strength and sensation are intact Psych: euthymic mood, full affect  Recent Labs: 08/07/2016: Magnesium 2.0; TSH 2.090 08/22/2016: BUN 14; Creatinine, Ser 1.02; Hemoglobin 15.1; Platelets 258; Potassium 4.4; Sodium 138    Lipid Panel    Component Value Date/Time   CHOL 216 (H) 08/07/2016 1240   TRIG 118 08/07/2016 1240   HDL 34 (L) 08/07/2016 1240   CHOLHDL 6.4 08/07/2016 1240   VLDL 24 08/07/2016 1240   LDLCALC 158 (H) 08/07/2016 1240      Wt Readings from Last 3 Encounters:  12/15/16 240 lb (108.9 kg)  11/03/16 239 lb (108.4 kg)  10/13/16 237 lb (107.5 kg)      Other studies Reviewed: 08/14/16 echo Study Conclusions  - Procedure narrative:  Transthoracic echocardiography. Image quality was adequate. The study was technically difficult. Intravenous contrast (Definity) was administered. - Left ventricle: Systolic function was moderately to severely reduced. The estimated ejection fraction was in the range of 30% to 35%. Diffuse hypokinesis. There is akinesis of the anteroseptal, anterior, and apical myocardium. - Right ventricle: Systolic function was normal.  Impressions:  - Limited study with Definity contrast. LVEF is in the range of 30-35%. There is a loosely organized LV apical thrombus noted. Diffuse hypokinesis overall with akinesis of the anterior, anteroseptal, and apical myocardium. RV contraction normal based on limited views.  08/2016 cath  Mid  RCA-2 lesion, 30 %stenosed.  Mid RCA-1 lesion, 30 %stenosed.  Ost Ramus to Ramus lesion, 20 %stenosed.  Ost LAD to Prox LAD lesion, 10 %stenosed.  Prox LAD to Mid LAD lesion, 60 %stenosed.  A STENT SYNERGY DES 2.25X12 drug eluting stent was successfully placed.  2nd Diag lesion, 99 %stenosed.  Post intervention, there is a 0% residual stenosis.  1. Patent stent proximal LAD 2. Moderate stenosis mid LAD. FFR was 0.84 suggesting the stenosis was not flow limiting.  3. The Diagonal branch is a moderate caliber vessel with a 99% stenosis.  4. Successful PTCA/DES x 1 Diagonal branch 5. Mild disease RCA  Recommendations: Will continue ASA and Plavix for now. He will need to be started on coumadin tomorrow before discharge for his LV thrombus. He can stop ASA when his INR is therapeutic on coumadin since he was not an ACS. I would plan long term coumadin for LV thrombus and Plavix for at least one year given placement of DES. Continue statin and beta blocker.   ASSESSMENT AND PLAN:  1.  Coronary artery disease: Known history of drug-eluting stent to the second diagonal and left anterior descending.  He remains on Plavix.  He is not on aspirin  as he is also on Coumadin.  Blood pressure and heart rate are well controlled.  He does have some fatigue on carvedilol.  May consider stopping Plavix in August 2019 and he is would have been on it for 1 year.  2.  History of LV thrombus: He continues on Coumadin therapy and is seen in our Coumadin clinic.  I will repeat a CBC and a BMET for evaluation of anemia and kidney function.  Echocardiogram is ordered.  3.  Chronic systolic dysfunction: Recent LV systolic function was between 30 and 35%.  He has no evidence of decompensation currently.  He is not on any diuretics at this time.  Feeding echocardiogram.  Continue lisinopril and carvedilol.  May need to consider Entresto on next office visit if the function allows.  Current medicines are reviewed at length with the patient today.    Labs/ tests ordered today include: Echocardiogram, BMET, CBC.  Bettey Mare. Liborio Nixon, ANP, AACC   12/15/2016 2:25 PM    [facility] [address] [place], [place] [address] Phone: [phone]; Fax: [phone]

## 2016-12-15 ENCOUNTER — Ambulatory Visit (INDEPENDENT_AMBULATORY_CARE_PROVIDER_SITE_OTHER): Payer: Self-pay | Admitting: Adult Health

## 2016-12-15 ENCOUNTER — Encounter: Payer: Self-pay | Admitting: Adult Health

## 2016-12-15 ENCOUNTER — Telehealth: Payer: Self-pay | Admitting: *Deleted

## 2016-12-15 ENCOUNTER — Other Ambulatory Visit (HOSPITAL_COMMUNITY)
Admission: RE | Admit: 2016-12-15 | Discharge: 2016-12-15 | Disposition: A | Discharge status: Home / Self Care | Payer: Self-pay | Source: Ambulatory Visit | Attending: Adult Health | Admitting: Adult Health

## 2016-12-15 VITALS — BP 126/86 | HR 78 | Ht 70.0 in | Wt 240.0 lb

## 2016-12-15 DIAGNOSIS — N289 Disorder of kidney and ureter, unspecified: Secondary | ICD-10-CM | POA: Insufficient documentation

## 2016-12-15 DIAGNOSIS — I513 Intracardiac thrombosis, not elsewhere classified: Secondary | ICD-10-CM

## 2016-12-15 DIAGNOSIS — I2129 ST elevation (STEMI) myocardial infarction involving other sites: Secondary | ICD-10-CM

## 2016-12-15 DIAGNOSIS — I43 Cardiomyopathy in diseases classified elsewhere: Secondary | ICD-10-CM

## 2016-12-15 DIAGNOSIS — I240 Acute coronary thrombosis not resulting in myocardial infarction: Secondary | ICD-10-CM

## 2016-12-15 DIAGNOSIS — I10 Essential (primary) hypertension: Secondary | ICD-10-CM

## 2016-12-15 DIAGNOSIS — D508 Other iron deficiency anemias: Secondary | ICD-10-CM

## 2016-12-15 DIAGNOSIS — I2510 Atherosclerotic heart disease of native coronary artery without angina pectoris: Secondary | ICD-10-CM

## 2016-12-15 LAB — BASIC METABOLIC PANEL
ANION GAP: 8 (ref 5–15)
BUN: 13 mg/dL (ref 6–20)
CHLORIDE: 102 mmol/L (ref 101–111)
CO2: 26 mmol/L (ref 22–32)
Calcium: 10 mg/dL (ref 8.9–10.3)
Creatinine, Ser: 0.91 mg/dL (ref 0.61–1.24)
GFR calc non Af Amer: >60 mL/min (ref >60)
Glucose, Bld: 174 mg/dL — ABNORMAL HIGH (ref 65–99)
Potassium: 4.2 mmol/L (ref 3.5–5.1)
Sodium: 136 mmol/L (ref 135–145)

## 2016-12-15 LAB — CBC WITH DIFFERENTIAL/PLATELET
BASOS PCT: 0 %
Basophils Absolute: 0 10*3/uL (ref 0.0–0.1)
Eosinophils Absolute: 0.1 10*3/uL (ref 0.0–0.7)
Eosinophils Relative: 1 %
HEMATOCRIT: 45.8 % (ref 39.0–52.0)
HEMOGLOBIN: 14.8 g/dL (ref 13.0–17.0)
LYMPHS ABS: 2.9 10*3/uL (ref 0.7–4.0)
Lymphocytes Relative: 35 %
MCH: 28.6 pg (ref 26.0–34.0)
MCHC: 32.3 g/dL (ref 30.0–36.0)
MCV: 88.6 fL (ref 78.0–100.0)
MONOS PCT: 12 %
Monocytes Absolute: 1 10*3/uL (ref 0.1–1.0)
NEUTROS ABS: 4.3 10*3/uL (ref 1.7–7.7)
NEUTROS PCT: 52 %
Platelets: 283 10*3/uL (ref 150–400)
RBC: 5.17 MIL/uL (ref 4.22–5.81)
RDW: 13 % (ref 11.5–15.5)
WBC: 8.3 10*3/uL (ref 4.0–10.5)

## 2016-12-15 NOTE — Telephone Encounter (Signed)
-----   Message from Jodelle Gross, NP sent at 12/15/2016  4:09 PM EST ----- I have reviewed his labs.  He does not appear to be dehydrated or anemic.  Blood glucose remains elevated.  Diabetes needs better control.  He will need to follow-up with PCP concerning this.

## 2016-12-15 NOTE — Telephone Encounter (Signed)
Called patient with test results. No answer. Left message to call back.  

## 2016-12-15 NOTE — Patient Instructions (Signed)
Medication Instructions:  Your physician recommends that you continue on your current medications as directed. Please refer to the Current Medication list given to you today.   Labwork: Your physician recommends that you return for lab work in: Today    Testing/Procedures: Your physician has requested that you have an echocardiogram. Echocardiography is a painless test that uses sound waves to create images of your heart. It provides your doctor with information about the size and shape of your heart and how well your heart's chambers and valves are working. This procedure takes approximately one hour. There are no restrictions for this procedure.    Follow-Up: Your physician recommends that you schedule a follow-up appointment in: 3 Months    Any Other Special Instructions Will Be Listed Below (If Applicable).   Thank you for choosing [facility]!   If you need a refill on your cardiac medications before your next appointment, please call your pharmacy.

## 2016-12-18 ENCOUNTER — Ambulatory Visit (HOSPITAL_COMMUNITY)
Admission: RE | Admit: 2016-12-18 | Discharge: 2016-12-18 | Disposition: A | Discharge status: Home / Self Care | Payer: Self-pay | Source: Ambulatory Visit | Attending: Adult Health | Admitting: Adult Health

## 2016-12-18 ENCOUNTER — Telehealth: Payer: Self-pay | Admitting: *Deleted

## 2016-12-18 DIAGNOSIS — I5030 Unspecified diastolic (congestive) heart failure: Secondary | ICD-10-CM | POA: Insufficient documentation

## 2016-12-18 DIAGNOSIS — I513 Intracardiac thrombosis, not elsewhere classified: Secondary | ICD-10-CM

## 2016-12-18 DIAGNOSIS — I240 Acute coronary thrombosis not resulting in myocardial infarction: Secondary | ICD-10-CM

## 2016-12-18 LAB — ECHOCARDIOGRAM COMPLETE
AVLVOTPG: 2 mmHg
CHL CUP DOP CALC LVOT VTI: 15 cm
CHL CUP MV DEC (S): 208
CHL CUP STROKE VOLUME: 43 mL
E decel time: 208 msec
EERAT: 10.8
FS: 14 % — AB (ref 28–44)
IVS/LV PW RATIO, ED: 0.84
LA ID, A-P, ES: 44 mm
LA diam end sys: 44 mm
LADIAMINDEX: 1.87 cm/m2
LAVOL: 59 mL
LAVOLA4C: 56.1 mL
LAVOLIN: 25 mL/m2
LDCA: 4.52 cm2
LV SIMPSON'S DISK: 34
LV TDI E'MEDIAL: 5
LV dias vol index: 55 mL/m2
LV dias vol: 129 mL (ref 62–150)
LV e' LATERAL: 6.09 cm/s
LV sys vol index: 36 mL/m2
LVEEAVG: 10.8
LVEEMED: 10.8
LVOT SV: 68 mL
LVOT diameter: 24 mm
LVOT peak vel: 73.1 cm/s
LVSYSVOL: 86 mL — AB (ref 21–61)
MV pk A vel: 74.7 m/s
MVPKEVEL: 65.8 m/s
PW: 11.9 mm — AB (ref 0.6–1.1)
RV LATERAL S' VELOCITY: 12.4 cm/s
TAPSE: 16.5 mm
TDI e' lateral: 6.09

## 2016-12-18 MED ORDER — PERFLUTREN LIPID MICROSPHERE
1.0000 mL | INTRAVENOUS | Status: AC | PRN
  Administered 2016-12-18: 2 mL via INTRAVENOUS
  Administered 2016-12-18: 1 mL via INTRAVENOUS
  Filled 2016-12-18: qty 10

## 2016-12-18 NOTE — Progress Notes (Signed)
*  PRELIMINARY RESULTS* Echocardiogram 2D Echocardiogram has been performed with Definity.  Stacey Drain 12/18/2016, 11:48 AM

## 2016-12-18 NOTE — Telephone Encounter (Signed)
Called patient with test results. No answer. Unable to leave msg.  

## 2016-12-18 NOTE — Telephone Encounter (Signed)
-----   Message from Jodelle Gross, NP sent at 12/18/2016  4:56 PM EST ----- View of echocardiogram still finds that he has an LV thrombus, loosely organized.  He should continue anticoagulation therapy.  He will need to follow-up discussion on next appointment.Marland Kitchen  His heart function remains reduced, and unchanged from prior previous echocardiogram with an EF of 30-35%.

## 2016-12-21 ENCOUNTER — Telehealth: Payer: Self-pay | Admitting: Cardiology

## 2016-12-21 NOTE — Telephone Encounter (Signed)
Pt not at home, per wife, reach on cell [phone], NA,NM-cc

## 2016-12-21 NOTE — Telephone Encounter (Signed)
Test results / tg  °

## 2016-12-24 ENCOUNTER — Ambulatory Visit (INDEPENDENT_AMBULATORY_CARE_PROVIDER_SITE_OTHER): Payer: Self-pay | Admitting: *Deleted

## 2016-12-24 DIAGNOSIS — I240 Acute coronary thrombosis not resulting in myocardial infarction: Secondary | ICD-10-CM

## 2016-12-24 DIAGNOSIS — I513 Intracardiac thrombosis, not elsewhere classified: Secondary | ICD-10-CM

## 2016-12-24 DIAGNOSIS — Z5181 Encounter for therapeutic drug level monitoring: Secondary | ICD-10-CM

## 2016-12-24 LAB — POCT INR: INR: 1.4

## 2016-12-29 ENCOUNTER — Ambulatory Visit: Payer: Self-pay | Admitting: Nutrition

## 2017-01-04 ENCOUNTER — Encounter: Payer: Self-pay | Admitting: Nutrition

## 2017-01-04 ENCOUNTER — Encounter: Payer: Self-pay | Attending: *Deleted | Admitting: Nutrition

## 2017-01-04 VITALS — Ht 70.0 in | Wt 243.0 lb

## 2017-01-04 DIAGNOSIS — E1165 Type 2 diabetes mellitus with hyperglycemia: Secondary | ICD-10-CM

## 2017-01-04 DIAGNOSIS — E118 Type 2 diabetes mellitus with unspecified complications: Principal | ICD-10-CM

## 2017-01-04 DIAGNOSIS — IMO0002 Reserved for concepts with insufficient information to code with codable children: Secondary | ICD-10-CM

## 2017-01-04 DIAGNOSIS — E669 Obesity, unspecified: Secondary | ICD-10-CM

## 2017-01-04 NOTE — Progress Notes (Signed)
  Medical Nutrition Therapy:  Appt start time:0800 end time:  830.  Assessment:  Primary concerns today: Diabetes, Obesity and Hyperlipidemia.  Didn't bring meter.  Walking and going to gym 5 days a week. He is still struggling with diet sodas and snacking. Doesn't cook much and so he eats out for breakfast often.  Willing to make more changes with diet and cut out snacks and reduce portions.   Diet remains excessive in calories due to 3 lbs weight gain.Marland Kitchen  Preferred Learning Style:  Seeing or hear.  Hands on     Ready  Change in progress   MEDICATIONS: See chrart   DIETARY INTAKE:   24-hr recall:  B ( AM): [employer] bacon and cheese biscuits.  Snk ( AM):  L ( PM):  Snk ( PM):  D ( PM): Snk ( PM):  Beverages: water, diet sodas  Usual physical activity: ADL  Estimated energy needs: 1800  calories 200 g carbohydrates 135 g protein 50 g fat  Progress Towards Goal(s):  In progress.   Nutritional Diagnosis:  NB-1.1 Food and nutrition-related knowledge deficit As related to Diabetes.  As evidenced by A1C 8.5%.    Intervention:  Nutrition and Diabetes education provided on My Plate, CHO counting, meal planning, portion sizes, timing of meals, avoiding snacks between meals unless having a low blood sugar, target ranges for A1C and blood sugars, signs/symptoms and treatment of hyper/hypoglycemia, monitoring blood sugars, taking medications as prescribed, benefits of exercising 30 minutes per day and prevention of complications of DM. Goals Goals 1. Breakfast: eggs and oatmeal and a piece fruit Cut out [employer], sausage, bacon and biscuits. Cut out diet sodas.. Cut out night snacks Drink more wate-5 bottles of water per day. Lose 1-2 lbs per week Get A1C to 7%.   Teaching Method Utilized:  Visual Auditory Hands on  Handouts given during visit include:  The Plate Method   Meal Plan Card  Diabetes Instructions.  HIgh Fiber Diet s/p Stent  placment  Barriers to learning/adherence to lifestyle change:  none  Demonstrated degree of understanding via:  Teach Back   Monitoring/Evaluation:  Dietary intake, exercise, meal planning, and body weight in 2-3  month.

## 2017-01-04 NOTE — Patient Instructions (Addendum)
Goals 1. Breakfast: eggs and oatmeal and a piece fruit Cut out [employer], sausage, bacon and biscuits. Cut out diet sodas.. Cut out night snacks Drink more wate-5 bottles of water per day. Lose 1-2 lbs per week Get A1C to 7%.

## 2017-01-05 ENCOUNTER — Ambulatory Visit (INDEPENDENT_AMBULATORY_CARE_PROVIDER_SITE_OTHER): Payer: Self-pay | Admitting: *Deleted

## 2017-01-05 DIAGNOSIS — Z5181 Encounter for therapeutic drug level monitoring: Secondary | ICD-10-CM

## 2017-01-05 DIAGNOSIS — I240 Acute coronary thrombosis not resulting in myocardial infarction: Secondary | ICD-10-CM

## 2017-01-05 DIAGNOSIS — I513 Intracardiac thrombosis, not elsewhere classified: Secondary | ICD-10-CM

## 2017-01-05 LAB — POCT INR: INR: 2.7

## 2017-01-05 MED ORDER — WARFARIN SODIUM 10 MG PO TABS: ORAL_TABLET | ORAL | 3 refills | Status: DC

## 2017-01-05 NOTE — Addendum Note (Signed)
Addended by: Louanna Raw on: 01/05/2017 04:56 PM   Modules accepted: Orders

## 2017-01-26 ENCOUNTER — Other Ambulatory Visit: Payer: Self-pay | Admitting: *Deleted

## 2017-01-26 ENCOUNTER — Ambulatory Visit (INDEPENDENT_AMBULATORY_CARE_PROVIDER_SITE_OTHER): Payer: Self-pay | Admitting: *Deleted

## 2017-01-26 DIAGNOSIS — I240 Acute coronary thrombosis not resulting in myocardial infarction: Secondary | ICD-10-CM

## 2017-01-26 DIAGNOSIS — M5136 Other intervertebral disc degeneration, lumbar region: Secondary | ICD-10-CM

## 2017-01-26 DIAGNOSIS — I513 Intracardiac thrombosis, not elsewhere classified: Secondary | ICD-10-CM

## 2017-01-26 DIAGNOSIS — M5030 Other cervical disc degeneration, unspecified cervical region: Secondary | ICD-10-CM

## 2017-01-26 DIAGNOSIS — Z5181 Encounter for therapeutic drug level monitoring: Secondary | ICD-10-CM

## 2017-01-26 LAB — POCT INR: INR: 1.9

## 2017-01-26 NOTE — Patient Instructions (Signed)
Take coumadin 2 tablets tonight then resume 1 1/2 tablets daily except 1 tablet on Mondays, Wednesdays and Fridays Recheck in 3 weeks 

## 2017-01-27 ENCOUNTER — Ambulatory Visit
Admission: RE | Admit: 2017-01-27 | Discharge: 2017-01-27 | Disposition: A | Discharge status: Home / Self Care | Payer: Self-pay | Source: Ambulatory Visit | Attending: *Deleted | Admitting: *Deleted

## 2017-01-27 DIAGNOSIS — M5030 Other cervical disc degeneration, unspecified cervical region: Secondary | ICD-10-CM

## 2017-01-27 DIAGNOSIS — M5136 Other intervertebral disc degeneration, lumbar region: Secondary | ICD-10-CM

## 2017-02-12 ENCOUNTER — Other Ambulatory Visit: Payer: Self-pay

## 2017-02-12 ENCOUNTER — Encounter (HOSPITAL_COMMUNITY): Payer: Self-pay | Admitting: Emergency Medicine

## 2017-02-12 ENCOUNTER — Emergency Department (HOSPITAL_COMMUNITY)
Admission: EM | Admit: 2017-02-12 | Discharge: 2017-02-12 | Disposition: A | Discharge status: Home / Self Care | Payer: Self-pay | Attending: Emergency Medicine | Admitting: Emergency Medicine

## 2017-02-12 DIAGNOSIS — Z7984 Long term (current) use of oral hypoglycemic drugs: Secondary | ICD-10-CM | POA: Insufficient documentation

## 2017-02-12 DIAGNOSIS — M545 Low back pain, unspecified: Secondary | ICD-10-CM

## 2017-02-12 DIAGNOSIS — I2510 Atherosclerotic heart disease of native coronary artery without angina pectoris: Secondary | ICD-10-CM | POA: Insufficient documentation

## 2017-02-12 DIAGNOSIS — M542 Cervicalgia: Secondary | ICD-10-CM | POA: Insufficient documentation

## 2017-02-12 DIAGNOSIS — E119 Type 2 diabetes mellitus without complications: Secondary | ICD-10-CM | POA: Insufficient documentation

## 2017-02-12 DIAGNOSIS — Z79899 Other long term (current) drug therapy: Secondary | ICD-10-CM | POA: Insufficient documentation

## 2017-02-12 DIAGNOSIS — Z7901 Long term (current) use of anticoagulants: Secondary | ICD-10-CM | POA: Insufficient documentation

## 2017-02-12 DIAGNOSIS — I119 Hypertensive heart disease without heart failure: Secondary | ICD-10-CM | POA: Insufficient documentation

## 2017-02-12 MED ORDER — NAPROXEN 500 MG PO TABS: 500.0000 mg | ORAL_TABLET | Freq: Two times a day (BID) | ORAL | 0 refills | Status: DC

## 2017-02-12 MED ORDER — OXYCODONE-ACETAMINOPHEN 5-325 MG PO TABS: 1.0000 | ORAL_TABLET | Freq: Four times a day (QID) | ORAL | 0 refills | Status: DC | PRN

## 2017-02-12 NOTE — ED Provider Notes (Signed)
[facility] EMERGENCY DEPARTMENT Provider Note   CSN: [id number] Arrival date & time: 02/12/17  2620     History   Chief Complaint Chief Complaint  Patient presents with  . Neck Pain  . Back Pain    HPI [name] [name] is a 52 y.o. male.  Patient is a 52 year old male with past medical history of degenerative disc disease presenting for evaluation of low back pain.  This is been ongoing for quite some time, however seems to be worse over the past several days.  He is having difficulty sleeping and is requesting medicine for pain.  He denies any bowel or bladder complaints.  He denies any weakness, numbness, or tingling.   The history is provided by the patient.  Back Pain   This is a recurrent problem. The current episode started 2 days ago. The problem occurs constantly. The problem has been rapidly worsening. The pain is associated with no known injury. The pain is present in the lumbar spine. The quality of the pain is described as stabbing. The pain does not radiate. The pain is moderate.    Past Medical History:  Diagnosis Date  . Arthritis    in lower back  . Coronary artery disease    a. 02/2008: anterior STEMI s/p DES to prox LAD, b. Myoview 2012: scar in the anterior wall with mild ischemia and EF 33%, c. neg GXT 06/2011  d. 08/2016: cath with DES to diagonal   . Depression with anxiety   . Diabetes mellitus    TYPE II  . Fatty liver    H/O ELEVATED LIVER ENZYMES  . Hyperlipidemia   . Ischemic cardiomyopathy    a. Previous EF 33-35% 2012, b. improved to 45-50% by echo 06/2011  . LV (left ventricular) mural thrombus without MI   . Noncompliance    Prior hx of med noncompliance  . OSA (obstructive sleep apnea)     Patient Active Problem List   Diagnosis Date Noted  . Encounter for therapeutic drug monitoring 08/24/2016  . LV (left ventricular) mural thrombus without MI   . Type 2 diabetes mellitus with complication (HCC)   . OSA (obstructive sleep apnea)   .  Unstable angina (HCC)   . Abnormal LFTs (liver function tests) 07/07/2011  . Cardiomyopathy, ischemic 04/24/2010  . Mixed hyperlipidemia 09/27/2008  . Essential hypertension, benign 09/27/2008  . CORONARY ATHEROSCLEROSIS NATIVE CORONARY ARTERY 09/27/2008    Past Surgical History:  Procedure Laterality Date  . CORONARY STENT INTERVENTION N/A 08/21/2016   Procedure: CORONARY STENT INTERVENTION;  Surgeon: Kathleene Hazel, MD;  Location: [facility] INVASIVE CV [facility];  Service: Cardiovascular;  Laterality: N/A;  . INTRAVASCULAR PRESSURE WIRE/FFR STUDY N/A 08/21/2016   Procedure: INTRAVASCULAR PRESSURE WIRE/FFR STUDY;  Surgeon: Kathleene Hazel, MD;  Location: [facility]VAS[facility] LAB;  Service: Cardiovascular;  Laterality: N/A;  . LIVER BIOPSY    . MI WITH STENTS    . NASAL SINUS SURGERY    . RIGHT/LEFT HEART CATH AND CORONARY ANGIOGRAPHY N/A 08/21/2016   Procedure: Right/Left Heart Cath and Coronary Angiography;  Surgeon: Kathleene Hazel, MD;  Location: [facility] INVASIVE CV LAB;  Service: Cardiovascular;  Laterality: N/A;       Home Medications    Prior to Admission medications   Medication Sig Start Date End Date Taking? Authorizing Provider  atorvastatin (LIPITOR) 80 MG tablet Take 1 tablet (80 mg total) by mouth daily. 08/06/16 02/12/17 Yes BranchDorothe Pea, MD  carvedilol (COREG) 12.5 MG tablet  Take 1 tablet (12.5 mg total) by mouth 2 (two) times daily. 08/28/16 02/12/17 Yes BranchDorothe Pea, MD  clopidogrel (PLAVIX) 75 MG tablet Take 1 tablet (75 mg total) by mouth daily with breakfast. 08/23/16  Yes Janetta Hora, PA-C  Ertugliflozin L-PyroglutamicAc (STEGLATRO) 5 MG TABS Take 5 mg by mouth.   Yes [provider]  lisinopril (PRINIVIL,ZESTRIL) 20 MG tablet Take 20 mg by mouth daily.   Yes [provider]  metFORMIN (GLUCOPHAGE) 1000 MG tablet Take 1,000 mg by mouth 2 (two) times daily with a meal.   Yes [provider]  sildenafil (REVATIO) 20 MG tablet  Take 20 mg by mouth daily as needed.    Yes [provider]  topiramate (TOPAMAX) 25 MG capsule Take 25 mg by mouth 2 (two) times daily.   Yes [provider]  warfarin (COUMADIN) 10 MG tablet Take 1 tablet daily except 1 1/2 tablets on Mondays, Wednesdays and Fridays or as directed 01/05/17  Yes Branch, Dorothe Pea, MD  nitroGLYCERIN (NITROSTAT) 0.4 MG SL tablet Place 0.4 mg under the tongue every 5 (five) minutes as needed for chest pain.    [provider]    Family History Family History  Problem Relation Age of Onset  . Coronary artery disease Unknown        UNKNOWN  . Heart disease Father        CAD s/p CABG  . Diabetes Mellitus II Father   . Diabetes Mellitus II Mother   . Cancer Brother     Social History Social History   Tobacco Use  . Smoking status: Never Smoker  . Smokeless tobacco: Never Used  Substance Use Topics  . Alcohol use: No  . Drug use: No     Allergies   Patient has no known allergies.   Review of Systems Review of Systems  Musculoskeletal: Positive for back pain.  All other systems reviewed and are negative.    Physical Exam Updated Vital Signs BP (!) 139/101 (BP Location: Right Arm)   Pulse 84   Temp 98.3 F (36.8 C) (Oral)   Resp 16   Ht 5\' 10"  (1.778 m)   Wt 109.3 kg (241 lb)   SpO2 98%   BMI 34.58 kg/m   Physical Exam  Constitutional: He is oriented to person, place, and time. He appears well-developed and well-nourished. No distress.  HENT:  Head: Normocephalic and atraumatic.  Neck: Normal range of motion. Neck supple.  Musculoskeletal:  There is tenderness to palpation in the soft tissues of the lumbar region.  Neurological: He is alert and oriented to person, place, and time.  DTRs are 1+ in the patellar and Achilles tendons bilaterally.  Strength is 5 out of 5 in both lower extremities.  He is able to ambulate without deficit.  Skin: Skin is warm and dry. He is not diaphoretic.  Nursing note and  vitals reviewed.    ED Treatments / Results  Labs (all labs ordered are listed, but only abnormal results are displayed) Labs Reviewed - No data to display  EKG  EKG Interpretation None       Radiology No results found.  Procedures Procedures (including critical care time)  Medications Ordered in ED Medications - No data to display   Initial Impression / Assessment and Plan / ED Course  I have reviewed the triage vital signs and the nursing notes.  Pertinent labs & imaging results that were available during my care of the patient  were reviewed by me and considered in my medical decision making (see chart for details).  Patient presenting with complaints of worsening low back pain.  He had an MRI recently revealing bulging disks.  He is requesting something for pain.  Upon reviewing the [facility] narcotic database, he has not filled any prescriptions for narcotic medications in the past 2 years.  I feel comfortable prescribing him a short course of oxycodone along with an anti-inflammatory.  There is nothing in his exam that would suggest an emergent situation.  Final Clinical Impressions(s) / ED Diagnoses   Final diagnoses:  None    ED Discharge Orders    None       Geoffery Lyons, MD 02/12/17 [phone]

## 2017-02-12 NOTE — Discharge Instructions (Signed)
Naproxen as prescribed.  Percocet as prescribed as needed for pain not relieved with naproxen.  Follow-up with your primary doctor if symptoms are not improving in the next week.

## 2017-02-12 NOTE — ED Triage Notes (Signed)
Patient has c/o of neck and pain and lower back pain, had MRI approximately 2 weeks ago and diagnosed with bulging discs.

## 2017-02-16 ENCOUNTER — Ambulatory Visit: Payer: Self-pay | Admitting: Nutrition

## 2017-02-16 ENCOUNTER — Ambulatory Visit (INDEPENDENT_AMBULATORY_CARE_PROVIDER_SITE_OTHER): Payer: Self-pay | Admitting: *Deleted

## 2017-02-16 DIAGNOSIS — I513 Intracardiac thrombosis, not elsewhere classified: Secondary | ICD-10-CM

## 2017-02-16 DIAGNOSIS — I240 Acute coronary thrombosis not resulting in myocardial infarction: Secondary | ICD-10-CM

## 2017-02-16 DIAGNOSIS — Z5181 Encounter for therapeutic drug level monitoring: Secondary | ICD-10-CM

## 2017-02-16 LAB — POCT INR: INR: 2.5

## 2017-02-16 NOTE — Patient Instructions (Signed)
Continue coumadin 1 1/2 tablets daily except 1 tablet on Mondays, Wednesdays and Fridays Recheck in 4 weeks 

## 2017-02-26 ENCOUNTER — Telehealth: Payer: Self-pay | Admitting: Cardiology

## 2017-02-26 ENCOUNTER — Ambulatory Visit (INDEPENDENT_AMBULATORY_CARE_PROVIDER_SITE_OTHER): Payer: Self-pay | Admitting: Cardiology

## 2017-02-26 ENCOUNTER — Other Ambulatory Visit: Payer: Self-pay

## 2017-02-26 ENCOUNTER — Encounter: Payer: Self-pay | Admitting: *Deleted

## 2017-02-26 VITALS — BP 129/86 | HR 79 | Ht 70.0 in | Wt 243.4 lb

## 2017-02-26 DIAGNOSIS — I10 Essential (primary) hypertension: Secondary | ICD-10-CM

## 2017-02-26 DIAGNOSIS — I240 Acute coronary thrombosis not resulting in myocardial infarction: Secondary | ICD-10-CM

## 2017-02-26 DIAGNOSIS — I513 Intracardiac thrombosis, not elsewhere classified: Secondary | ICD-10-CM

## 2017-02-26 DIAGNOSIS — I2510 Atherosclerotic heart disease of native coronary artery without angina pectoris: Secondary | ICD-10-CM

## 2017-02-26 DIAGNOSIS — I255 Ischemic cardiomyopathy: Secondary | ICD-10-CM

## 2017-02-26 MED ORDER — LISINOPRIL 40 MG PO TABS: 40.0000 mg | ORAL_TABLET | Freq: Every day | ORAL | 0 refills | Status: DC

## 2017-02-26 NOTE — Progress Notes (Signed)
Clinical Summary Mr. Fringer is a 52 y.o.male  seen today for follow up of the following medical problems.   1. CAD/ICM/Chronic systolic HF - history of anterior STEMI 02/2008, received DES to LAD. LVEF at that time was 35% - 2012 myoview anterior scar with mild ischemia - 06/2011 echo LVEF 45-50% - 06/2011 GXT no ischemia - 07/2016 echo shows LVEF down to 30-35%, diffuse hypokinesis with akinesis of anerior and anteroseptal walls. Evidence of LV thrombus - with drop in LVEF and ongoing chest pain was referred for cath last visit 08/2016 cath: see report below, received DES to D2 99%. Ok to stop ASA once coumadin is therapeutic per interventional cardiology, continue plavix.  11/2016 echo LVEF 35%, apical thrombus - cannot afford cardiac rehab.   - no edema, no SOB or edema. - compliant with meds.   2. LV thrombus - noted on recent echo - he has started coumadin, had not been therapeutic previously  - no bleeding issues on coumadin    3. HTN - home bp's up to 150s/120s per his report - takes goodies powder at times, no other NSAIDs    Past Medical History:  Diagnosis Date  . Arthritis    in lower back  . Coronary artery disease    a. 02/2008: anterior STEMI s/p DES to prox LAD, b. Myoview 2012: scar in the anterior wall with mild ischemia and EF 33%, c. neg GXT 06/2011  d. 08/2016: cath with DES to diagonal   . Depression with anxiety   . Diabetes mellitus    TYPE II  . Fatty liver    H/O ELEVATED LIVER ENZYMES  . Hyperlipidemia   . Ischemic cardiomyopathy    a. Previous EF 33-35% 2012, b. improved to 45-50% by echo 06/2011  . LV (left ventricular) mural thrombus without MI   . Noncompliance    Prior hx of med noncompliance  . OSA (obstructive sleep apnea)      No Known Allergies   Current Outpatient Medications  Medication Sig Dispense Refill  . atorvastatin (LIPITOR) 80 MG tablet Take 1 tablet (80 mg total) by mouth daily. 90 tablet 1  . carvedilol  (COREG) 12.5 MG tablet Take 1 tablet (12.5 mg total) by mouth 2 (two) times daily. 180 tablet 3  . clopidogrel (PLAVIX) 75 MG tablet Take 1 tablet (75 mg total) by mouth daily with breakfast. 90 tablet 3  . Ertugliflozin L-PyroglutamicAc (STEGLATRO) 5 MG TABS Take 5 mg by mouth.    Marland Kitchen lisinopril (PRINIVIL,ZESTRIL) 20 MG tablet Take 20 mg by mouth daily.    . metFORMIN (GLUCOPHAGE) 1000 MG tablet Take 1,000 mg by mouth 2 (two) times daily with a meal.    . naproxen (NAPROSYN) 500 MG tablet Take 1 tablet (500 mg total) by mouth 2 (two) times daily. 20 tablet 0  . nitroGLYCERIN (NITROSTAT) 0.4 MG SL tablet Place 0.4 mg under the tongue every 5 (five) minutes as needed for chest pain.    . sildenafil (REVATIO) 20 MG tablet Take 20 mg by mouth daily as needed.     . topiramate (TOPAMAX) 25 MG capsule Take 25 mg by mouth 2 (two) times daily.    Marland Kitchen warfarin (COUMADIN) 10 MG tablet Take 1 tablet daily except 1 1/2 tablets on Mondays, Wednesdays and Fridays or as directed 45 tablet 3   No current facility-administered medications for this visit.      Past Surgical History:  Procedure Laterality Date  . CORONARY STENT  INTERVENTION N/A 08/21/2016   Procedure: CORONARY STENT INTERVENTION;  Surgeon: Kathleene Hazel, MD;  Location: [facility]ASIVE [facility];  Service: Cardiovascular;  Laterality: N/A;  . INTRAVASCULAR PRESSURE WIRE/FFR STUDY N/A 08/21/2016   Procedure: INTRAVASCULAR PRESSURE WIRE/FFR STUDY;  Surgeon: Kathleene Hazel, MD;  Location: [facility];  Service: Cardiovascular;  Laterality: N/A;  . LIVER BIOPSY    . MI WITH STENTS    . NASAL SINUS SURGERY    . RIGHT/LEFT HEART CATH AND CORONARY ANGIOGRAPHY N/A 08/21/2016   Procedure: Right/Left Heart Cath and Coronary Angiography;  Surgeon: Kathleene Hazel, MD;  Location: [facility] INVASIVE CV LAB;  Service: Cardiovascular;  Laterality: N/A;     No Known Allergies    Family History  Problem Relation Age of Onset  . Coronary  artery disease Unknown        UNKNOWN  . Heart disease Father        CAD s/p CABG  . Diabetes Mellitus II Father   . Diabetes Mellitus II Mother   . Cancer Brother      Social History Mr. Stmartin reports that  has never smoked. he has never used smokeless tobacco. Mr. Dupras reports that he does not drink alcohol.   Review of Systems CONSTITUTIONAL: No weight loss, fever, chills, weakness or fatigue.  HEENT: Eyes: No visual loss, blurred vision, double vision or yellow sclerae.No hearing loss, sneezing, congestion, runny nose or sore throat.  SKIN: No rash or itching.  CARDIOVASCULAR: per hpi RESPIRATORY: No shortness of breath, cough or sputum.  GASTROINTESTINAL: No anorexia, nausea, vomiting or diarrhea. No abdominal pain or blood.  GENITOURINARY: No burning on urination, no polyuria NEUROLOGICAL: No headache, dizziness, syncope, paralysis, ataxia, numbness or tingling in the extremities. No change in bowel or bladder control.  MUSCULOSKELETAL: No muscle, back pain, joint pain or stiffness.  LYMPHATICS: No enlarged nodes. No history of splenectomy.  PSYCHIATRIC: No history of depression or anxiety.  ENDOCRINOLOGIC: No reports of sweating, cold or heat intolerance. No polyuria or polydipsia.  Marland Kitchen   Physical Examination Vitals:   02/26/17 0809  BP: 129/86  Pulse: 79   Filed Weights   02/26/17 0809  Weight: 243 lb 6.4 oz (110.4 kg)    Gen: resting comfortably, no acute distress HEENT: no scleral icterus, pupils equal round and reactive, no palptable cervical adenopathy,  CV: RRR, no m/r/g, no jvd Resp: Clear to auscultation bilaterally GI: abdomen is soft, non-tender, non-distended, normal bowel sounds, no hepatosplenomegaly MSK: extremities are warm, no edema.  Skin: warm, no rash Neuro:  no focal deficits Psych: appropriate affect   Diagnostic Studies 6./2013 echo Study Conclusions  - Left ventricle: Systolic function was mildly reduced. The estimated ejection  fraction was in the range of 45% to 50%. - Left atrium: The atrium was mildly dilated.  08/07/16 echo Study Conclusions  - Left ventricle: LV systolic function is depressed at approximately 30 to 35% with severe hypokinesis/akinesis of the inferior, septal, distal lateral and apical walls. Cannot exclude mural thrombus at apex. Suggest limited echo with Definity to define The cavity size was normal. Wall thickness was normal. - Mitral valve: There was mild regurgitation. - Left atrium: The atrium was mildly dilated.  08/14/16 echo Study Conclusions  - Procedure narrative: Transthoracic echocardiography. Image quality was adequate. The study was technically difficult. Intravenous contrast (Definity) was administered. - Left ventricle: Systolic function was moderately to severely reduced. The estimated ejection fraction was in the range of 30% to 35%. Diffuse  hypokinesis. There is akinesis of the anteroseptal, anterior, and apical myocardium. - Right ventricle: Systolic function was normal.  Impressions:  - Limited study with Definity contrast. LVEF is in the range of 30-35%. There is a loosely organized LV apical thrombus noted. Diffuse hypokinesis overall with akinesis of the anterior, anteroseptal, and apical myocardium. RV contraction normal based on limited views.  08/2016 cath  Mid RCA-2 lesion, 30 %stenosed.  Mid RCA-1 lesion, 30 %stenosed.  Ost Ramus to Ramus lesion, 20 %stenosed.  Ost LAD to Prox LAD lesion, 10 %stenosed.  Prox LAD to Mid LAD lesion, 60 %stenosed.  A STENT SYNERGY DES 2.25X12 drug eluting stent was successfully placed.  2nd Diag lesion, 99 %stenosed.  Post intervention, there is a 0% residual stenosis.  1. Patent stent proximal LAD 2. Moderate stenosis mid LAD. FFR was 0.84 suggesting the stenosis was not flow limiting.  3. The Diagonal branch is a moderate caliber vessel with a 99% stenosis.  4.  Successful PTCA/DES x 1 Diagonal branch 5. Mild disease RCA  Recommendations: Will continue ASA and Plavix for now. He will need to be started on coumadin tomorrow before discharge for his LV thrombus. He can stop ASA when his INR is therapeutic on coumadin since he was not an ACS. I would plan long term coumadin for LV thrombus and Plavix for at least one year given placement of DES. Continue statin and beta blocker.     Assessment and Plan  1. CAD/ICM/chronic systolic HF - medical therapy limited by orthostatic symptoms. Will try increaseing lisinopril to  daily due to HTN.  - on coumadin and plavix due to LV thrombus and recent stent.  - continue current meds  2. LV thrombus - repeat echo, continue coumadin  3. HTN  - increase lisinopril to  daily, check BMET in 2 weeks   F/u 3 months      Antoine Poche, M.D.

## 2017-02-26 NOTE — Patient Instructions (Addendum)
Medication Instructions:  Your physician has recommended you make the following change in your medication:   INCREASE lisinopril to 40 mg daily]  Please continue all other medications as prescribed  Labwork: BMET In 2 weeks Orders given today  Testing/Procedures: Your physician has requested that you have an echocardiogram. Echocardiography is a painless test that uses sound waves to create images of your heart. It provides your doctor with information about the size and shape of your heart and how well your heart's chambers and valves are working. This procedure takes approximately one hour. There are no restrictions for this procedure.  Follow-Up: Your physician recommends that you schedule a follow-up appointment in: 3 MONTHS   Any Other Special Instructions Will Be Listed Below (If Applicable).  If you need a refill on your cardiac medications before your next appointment, please call your pharmacy.

## 2017-02-26 NOTE — Telephone Encounter (Signed)
Pre-cert Verification for the following procedure  Limited echo  Scheduled for 03-03-17  At [facility]

## 2017-03-01 ENCOUNTER — Encounter: Payer: Self-pay | Admitting: Cardiology

## 2017-03-03 ENCOUNTER — Other Ambulatory Visit (HOSPITAL_COMMUNITY)
Admission: RE | Admit: 2017-03-03 | Discharge: 2017-03-03 | Disposition: A | Discharge status: Home / Self Care | Payer: Self-pay | Source: Ambulatory Visit | Attending: Cardiology | Admitting: Cardiology

## 2017-03-03 ENCOUNTER — Ambulatory Visit (HOSPITAL_COMMUNITY)
Admission: RE | Admit: 2017-03-03 | Discharge: 2017-03-03 | Disposition: A | Discharge status: Home / Self Care | Payer: Self-pay | Source: Ambulatory Visit | Attending: Cardiology | Admitting: Cardiology

## 2017-03-03 DIAGNOSIS — I119 Hypertensive heart disease without heart failure: Secondary | ICD-10-CM | POA: Insufficient documentation

## 2017-03-03 DIAGNOSIS — E785 Hyperlipidemia, unspecified: Secondary | ICD-10-CM | POA: Insufficient documentation

## 2017-03-03 DIAGNOSIS — I255 Ischemic cardiomyopathy: Secondary | ICD-10-CM | POA: Insufficient documentation

## 2017-03-03 DIAGNOSIS — R29898 Other symptoms and signs involving the musculoskeletal system: Secondary | ICD-10-CM | POA: Insufficient documentation

## 2017-03-03 DIAGNOSIS — I240 Acute coronary thrombosis not resulting in myocardial infarction: Secondary | ICD-10-CM

## 2017-03-03 DIAGNOSIS — I219 Acute myocardial infarction, unspecified: Secondary | ICD-10-CM | POA: Insufficient documentation

## 2017-03-03 DIAGNOSIS — I513 Intracardiac thrombosis, not elsewhere classified: Secondary | ICD-10-CM

## 2017-03-03 LAB — BASIC METABOLIC PANEL
ANION GAP: 10 (ref 5–15)
BUN: 18 mg/dL (ref 6–20)
CALCIUM: 9.5 mg/dL (ref 8.9–10.3)
CO2: 24 mmol/L (ref 22–32)
Chloride: 102 mmol/L (ref 101–111)
Creatinine, Ser: 1.05 mg/dL (ref 0.61–1.24)
GFR calc Af Amer: >60 mL/min (ref >60)
GLUCOSE: 243 mg/dL — AB (ref 65–99)
Potassium: 4.6 mmol/L (ref 3.5–5.1)
Sodium: 136 mmol/L (ref 135–145)

## 2017-03-03 LAB — ECHOCARDIOGRAM LIMITED
EWDT: 180 ms
FS: 16 % — AB (ref 28–44)
IV/PV OW: 0.98
LA ID, A-P, ES: 47 mm
LA diam end sys: 47 mm
LADIAMINDEX: 1.98 cm/m2
LV PW d: 12.3 mm — AB (ref 0.6–1.1)
MV Dec: 180
MV Peak grad: 4 mmHg
MV pk A vel: 34.9 m/s
MV pk E vel: 100 m/s

## 2017-03-03 MED ORDER — PERFLUTREN LIPID MICROSPHERE
1.0000 mL | INTRAVENOUS | Status: AC | PRN
  Administered 2017-03-03: 2 mL via INTRAVENOUS
  Filled 2017-03-03: qty 10

## 2017-03-03 NOTE — Progress Notes (Signed)
*  PRELIMINARY RESULTS* Echocardiogram 2D Echocardiogram WITH DEFINITY has been performed.  [name] [name] 03/03/2017, 12:08 PM

## 2017-03-05 ENCOUNTER — Telehealth: Payer: Self-pay | Admitting: Cardiology

## 2017-03-05 NOTE — Telephone Encounter (Signed)
Advised pt we will contact him once Dr. Wyline Mood results his echo to [place]. He voiced understanding.

## 2017-03-05 NOTE — Telephone Encounter (Signed)
Would like to know results from Echo °

## 2017-03-05 NOTE — Telephone Encounter (Signed)
Patient asking for results of his test and lab work

## 2017-03-05 NOTE — Telephone Encounter (Signed)
Notified pt that results have not been resulted to nursing staff at this time. Ensured pt that results would be called as soon as they are resulted.

## 2017-03-09 NOTE — Telephone Encounter (Signed)
Pt made aware. He voiced understanding. Will forward to Vashti Hey, RN.

## 2017-03-09 NOTE — Telephone Encounter (Signed)
Patient calling asking for results of test and lab work

## 2017-03-09 NOTE — Telephone Encounter (Signed)
Labs look good. Echo shows the blood clot has dissolved, ok to stop coumadin, please make coumadin clinic aware as well   Dominga Ferry MD

## 2017-03-10 ENCOUNTER — Ambulatory Visit: Payer: Self-pay | Admitting: *Deleted

## 2017-03-10 DIAGNOSIS — I513 Intracardiac thrombosis, not elsewhere classified: Secondary | ICD-10-CM

## 2017-03-10 DIAGNOSIS — Z5181 Encounter for therapeutic drug level monitoring: Secondary | ICD-10-CM

## 2017-03-10 DIAGNOSIS — I240 Acute coronary thrombosis not resulting in myocardial infarction: Secondary | ICD-10-CM

## 2017-03-10 NOTE — Telephone Encounter (Signed)
Noted.  Removed from coumadin clinic enrollment.

## 2017-03-17 ENCOUNTER — Ambulatory Visit: Payer: Self-pay | Admitting: Nutrition

## 2017-03-19 ENCOUNTER — Ambulatory Visit: Payer: Self-pay | Admitting: Cardiology

## 2017-05-23 ENCOUNTER — Other Ambulatory Visit: Payer: Self-pay | Admitting: Cardiology

## 2017-06-02 ENCOUNTER — Encounter: Payer: Self-pay | Admitting: *Deleted

## 2017-06-02 ENCOUNTER — Ambulatory Visit (INDEPENDENT_AMBULATORY_CARE_PROVIDER_SITE_OTHER): Payer: Self-pay | Admitting: Cardiology

## 2017-06-02 ENCOUNTER — Other Ambulatory Visit: Payer: Self-pay

## 2017-06-02 VITALS — BP 142/86 | HR 82 | Ht 70.0 in | Wt 236.0 lb

## 2017-06-02 DIAGNOSIS — I2510 Atherosclerotic heart disease of native coronary artery without angina pectoris: Secondary | ICD-10-CM

## 2017-06-02 DIAGNOSIS — I10 Essential (primary) hypertension: Secondary | ICD-10-CM

## 2017-06-02 DIAGNOSIS — I513 Intracardiac thrombosis, not elsewhere classified: Secondary | ICD-10-CM

## 2017-06-02 DIAGNOSIS — I240 Acute coronary thrombosis not resulting in myocardial infarction: Secondary | ICD-10-CM

## 2017-06-02 DIAGNOSIS — I5022 Chronic systolic (congestive) heart failure: Secondary | ICD-10-CM

## 2017-06-02 MED ORDER — CARVEDILOL 12.5 MG PO TABS: 18.7500 mg | ORAL_TABLET | Freq: Two times a day (BID) | ORAL | 1 refills | Status: DC

## 2017-06-02 NOTE — Patient Instructions (Signed)
Your physician recommends that you schedule a follow-up appointment in: 2 MONTHS WITH DR [facility]  Your physician has recommended you make the following change in your medication:   INCREASE COREG 18.75 MG (1 AND 1/2 TABLETS) TWICE DAILY  START ASPIRIN 81 MG DAILY  Thank you for choosing [facility]!!

## 2017-06-02 NOTE — Progress Notes (Signed)
Clinical Summary Mr. Mareno is a 52 y.o.male seen today for follow up of the following medical problems.   1. CAD/ICM/Chronic systolic HF - history of anterior STEMI 02/2008, received DES to LAD. LVEF at that time was 35% - 2012 myoview anterior scar with mild ischemia - 06/2011 echo LVEF 45-50% - 06/2011 GXT no ischemia - 07/2016 echo shows LVEF down to 30-35%, diffuse hypokinesis with akinesis of anerior and anteroseptal walls. Evidence of LV thrombus - with drop in LVEF and ongoing chest pain was referred for cath last visit 08/2016 cath: see report below, received DES to D2 99%. Ok to stop ASA once coumadin is therapeutic per interventional cardiology, continue plavix.  11/2016 echo LVEF 35%, apical thrombus - 02/2017 echo LVEF 35-40%, restrictive, no clear thrombus    - last visit we increased lisinopril to 40mg  daily. No new side effects, labs remained stable.  - no recent chest pain. No recent edema. Does not check weights at home, limiting sodium intake.   2. LV thrombus - noted on 11/2016 echo -repeat echo 02/2017 showed resolution of clot, coumadin was stopped.    3. HTN - last visit increased lisinopril to 40mg  daily.  - compliant with meds.    SH: looking for job currently, unemployed Past Medical History:  Diagnosis Date  . Arthritis    in lower back  . Coronary artery disease    a. 02/2008: anterior STEMI s/p DES to prox LAD, b. Myoview 2012: scar in the anterior wall with mild ischemia and EF 33%, c. neg GXT 06/2011  d. 08/2016: cath with DES to diagonal   . Depression with anxiety   . Diabetes mellitus    TYPE II  . Fatty liver    H/O ELEVATED LIVER ENZYMES  . Hyperlipidemia   . Ischemic cardiomyopathy    a. Previous EF 33-35% 2012, b. improved to 45-50% by echo 06/2011  . LV (left ventricular) mural thrombus without MI   . Noncompliance    Prior hx of med noncompliance  . OSA (obstructive sleep apnea)      No Known Allergies   Current Outpatient  Medications  Medication Sig Dispense Refill  . atorvastatin (LIPITOR) 80 MG tablet TAKE 1 TABLET BY MOUTH ONCE DAILY 90 tablet 0  . carvedilol (COREG) 12.5 MG tablet Take 1 tablet (12.5 mg total) by mouth 2 (two) times daily. 180 tablet 3  . clopidogrel (PLAVIX) 75 MG tablet Take 1 tablet (75 mg total) by mouth daily with breakfast. 90 tablet 3  . Ertugliflozin L-PyroglutamicAc (STEGLATRO) 5 MG TABS Take 5 mg by mouth.    Marland Kitchen lisinopril (PRINIVIL,ZESTRIL) 40 MG tablet Take 1 tablet (40 mg total) by mouth daily. 90 tablet 0  . metFORMIN (GLUCOPHAGE) 1000 MG tablet Take 1,000 mg by mouth 2 (two) times daily with a meal.    . naproxen (NAPROSYN) 500 MG tablet Take 1 tablet (500 mg total) by mouth 2 (two) times daily. 20 tablet 0  . nitroGLYCERIN (NITROSTAT) 0.4 MG SL tablet Place 0.4 mg under the tongue every 5 (five) minutes as needed for chest pain.    . sildenafil (REVATIO) 20 MG tablet Take 20 mg by mouth daily as needed.     . topiramate (TOPAMAX) 25 MG capsule Take 25 mg by mouth 2 (two) times daily.    Marland Kitchen warfarin (COUMADIN) 10 MG tablet Take 1 tablet daily except 1 1/2 tablets on Mondays, Wednesdays and Fridays or as directed 45 tablet 3  No current facility-administered medications for this visit.      Past Surgical History:  Procedure Laterality Date  . CORONARY STENT INTERVENTION N/A 08/21/2016   Procedure: CORONARY STENT INTERVENTION;  Surgeon: Kathleene Hazel, MD;  Location: [facility] INVASIVE CV LAB;  Service: Cardiovascular;  Laterality: N/A;  . INTRAVASCULAR PRESSURE WIRE/FFR STUDY N/A 08/21/2016   Procedure: INTRAVASCULAR PRESSURE WIRE/FFR STUDY;  Surgeon: Kathleene Hazel, MD;  Location: [facility] INVASIVE CV LAB;  Service: Cardiovascular;  Laterality: N/A;  . LIVER BIOPSY    . MI WITH STENTS    . NASAL SINUS SURGERY    . RIGHT/LEFT HEART CATH AND CORONARY ANGIOGRAPHY N/A 08/21/2016   Procedure: Right/Left Heart Cath and Coronary Angiography;  Surgeon: Kathleene Hazel, MD;   Location[facility] INVASIVE CV LAB;  Service: Cardiovascular;  Laterality: N/A;     No Known Allergies    Family History  Problem Relation Age of Onset  . Coronary artery disease Unknown        UNKNOWN  . Heart disease Father        CAD s/p CABG  . Diabetes Mellitus II Father   . Diabetes Mellitus II Mother   . Cancer Brother      Social History Mr. Wach reports that he has never smoked. He has never used smokeless tobacco. Mr. Keating reports that he does not drink alcohol.   Review of Systems CONSTITUTIONAL: No weight loss, fever, chills, weakness or fatigue.  HEENT: Eyes: No visual loss, blurred vision, double vision or yellow sclerae.No hearing loss, sneezing, congestion, runny nose or sore throat.  SKIN: No rash or itching.  CARDIOVASCULAR: per hpi RESPIRATORY: No shortness of breath, cough or sputum.  GASTROINTESTINAL: No anorexia, nausea, vomiting or diarrhea. No abdominal pain or blood.  GENITOURINARY: No burning on urination, no polyuria NEUROLOGICAL: No headache, dizziness, syncope, paralysis, ataxia, numbness or tingling in the extremities. No change in bowel or bladder control.  MUSCULOSKELETAL: No muscle, back pain, joint pain or stiffness.  LYMPHATICS: No enlarged nodes. No history of splenectomy.  PSYCHIATRIC: No history of depression or anxiety.  ENDOCRINOLOGIC: No reports of sweating, cold or heat intolerance. No polyuria or polydipsia.  Marland Kitchen   Physical Examination Vitals:   06/02/17 0810  BP: (!) 142/86  Pulse: 82  SpO2: 97%   Vitals:   06/02/17 0810  Weight: 236 lb (107 kg)  Height: 5\' 10"  (1.778 m)    Gen: resting comfortably, no acute distress HEENT: no scleral icterus, pupils equal round and reactive, no palptable cervical adenopathy,  CV: RRR, no m/r/g, no jvd Resp: Clear to auscultation bilaterally GI: abdomen is soft, non-tender, non-distended, normal bowel sounds, no hepatosplenomegaly MSK: extremities are warm, no edema.  Skin: warm, no  rash Neuro:  no focal deficits Psych: appropriate affect   Diagnostic Studies  6./2013 echo Study Conclusions  - Left ventricle: Systolic function was mildly reduced. The estimated ejection fraction was in the range of 45% to 50%. - Left atrium: The atrium was mildly dilated.  08/07/16 echo Study Conclusions  - Left ventricle: LV systolic function is depressed at approximately 30 to 35% with severe hypokinesis/akinesis of the inferior, septal, distal lateral and apical walls. Cannot exclude mural thrombus at apex. Suggest limited echo with Definity to define The cavity size was normal. Wall thickness was normal. - Mitral valve: There was mild regurgitation. - Left atrium: The atrium was mildly dilated.  08/14/16 echo Study Conclusions  - Procedure narrative: Transthoracic echocardiography. Image quality was adequate. The study  was technically difficult. Intravenous contrast (Definity) was administered. - Left ventricle: Systolic function was moderately to severely reduced. The estimated ejection fraction was in the range of 30% to 35%. Diffuse hypokinesis. There is akinesis of the anteroseptal, anterior, and apical myocardium. - Right ventricle: Systolic function was normal.  Impressions:  - Limited study with Definity contrast. LVEF is in the range of 30-35%. There is a loosely organized LV apical thrombus noted. Diffuse hypokinesis overall with akinesis of the anterior, anteroseptal, and apical myocardium. RV contraction normal based on limited views.  08/2016 cath  Mid RCA-2 lesion, 30 %stenosed.  Mid RCA-1 lesion, 30 %stenosed.  Ost Ramus to Ramus lesion, 20 %stenosed.  Ost LAD to Prox LAD lesion, 10 %stenosed.  Prox LAD to Mid LAD lesion, 60 %stenosed.  A STENT SYNERGY DES 2.25X12 drug eluting stent was successfully placed.  2nd Diag lesion, 99 %stenosed.  Post intervention, there is a 0% residual stenosis.  1.  Patent stent proximal LAD 2. Moderate stenosis mid LAD. FFR was 0.84 suggesting the stenosis was not flow limiting.  3. The Diagonal Solmon Bohr is a moderate caliber vessel with a 99% stenosis.  4. Successful PTCA/DES x 1 Diagonal Roshon Duell 5. Mild disease RCA  Recommendations: Will continue ASA and Plavix for now. He will need to be started on coumadin tomorrow before discharge for his LV thrombus. He can stop ASA when his INR is therapeutic on coumadin since he was not an ACS. I would plan long term coumadin for LV thrombus and Plavix for at least one year given placement of DES. Continue statin and beta blocker.  02/2017 echo Study Conclusions  - Left ventricle: The cavity size was normal. Wall thickness was   increased in a pattern of mild LVH. Systolic function was   moderately reduced. The estimated ejection fraction was in the   range of 35% to 40%. No definitive thrombus seen with contrast   enhancement. Diffuse hypokinesis. Diastolic dysfunction with   restrictive physiology. - Regional wall motion abnormality: Akinesis of the mid anterior   and basal-mid anteroseptal myocardium. - Right ventricle: Systolic function was reduced.  Assessment and Plan  1. CAD/ICM/chronic systolic HF -medical therapy limited by orthostatic symptoms - we will try increasing coreg to 18.75mg  bid - paying out of pocket for meds, hold on entresto for now. Once optimized on beta blocker consider if he may qualify for drug assistance.  - continue ASA and plavix until 08/2017  2. LV thrombus - resolved by echo, he is off coumadin at this time.   3. HTN - mildly above goal for <130/80, increase coreg as reported above.    F/u 3 months        Antoine Poche, M.D.

## 2017-07-26 ENCOUNTER — Other Ambulatory Visit: Payer: Self-pay

## 2017-07-26 ENCOUNTER — Emergency Department (HOSPITAL_BASED_OUTPATIENT_CLINIC_OR_DEPARTMENT_OTHER)
Admission: EM | Admit: 2017-07-26 | Discharge: 2017-07-27 | Disposition: A | Discharge status: Home / Self Care | Payer: Self-pay | Attending: Emergency Medicine | Admitting: Emergency Medicine

## 2017-07-26 ENCOUNTER — Encounter (HOSPITAL_BASED_OUTPATIENT_CLINIC_OR_DEPARTMENT_OTHER): Payer: Self-pay

## 2017-07-26 DIAGNOSIS — W458XXA Other foreign body or object entering through skin, initial encounter: Secondary | ICD-10-CM | POA: Insufficient documentation

## 2017-07-26 DIAGNOSIS — Y9389 Activity, other specified: Secondary | ICD-10-CM | POA: Insufficient documentation

## 2017-07-26 DIAGNOSIS — Z7901 Long term (current) use of anticoagulants: Secondary | ICD-10-CM | POA: Insufficient documentation

## 2017-07-26 DIAGNOSIS — S6991XA Unspecified injury of right wrist, hand and finger(s), initial encounter: Secondary | ICD-10-CM

## 2017-07-26 DIAGNOSIS — S6981XA Other specified injuries of right wrist, hand and finger(s), initial encounter: Secondary | ICD-10-CM | POA: Insufficient documentation

## 2017-07-26 DIAGNOSIS — Z79899 Other long term (current) drug therapy: Secondary | ICD-10-CM | POA: Insufficient documentation

## 2017-07-26 DIAGNOSIS — Z7984 Long term (current) use of oral hypoglycemic drugs: Secondary | ICD-10-CM | POA: Insufficient documentation

## 2017-07-26 DIAGNOSIS — Y999 Unspecified external cause status: Secondary | ICD-10-CM | POA: Insufficient documentation

## 2017-07-26 DIAGNOSIS — Z23 Encounter for immunization: Secondary | ICD-10-CM | POA: Insufficient documentation

## 2017-07-26 DIAGNOSIS — Y929 Unspecified place or not applicable: Secondary | ICD-10-CM | POA: Insufficient documentation

## 2017-07-26 DIAGNOSIS — Z7982 Long term (current) use of aspirin: Secondary | ICD-10-CM | POA: Insufficient documentation

## 2017-07-26 DIAGNOSIS — E119 Type 2 diabetes mellitus without complications: Secondary | ICD-10-CM | POA: Insufficient documentation

## 2017-07-26 NOTE — ED Triage Notes (Signed)
Pt with fish hook/lure to right thumb since aprox 830pm-states he attempted to remove PTA-NAD-steady gait

## 2017-07-27 MED ORDER — CEPHALEXIN 250 MG PO CAPS
1000.0000 mg | ORAL_CAPSULE | Freq: Once | ORAL | Status: AC
  Administered 2017-07-27: 1000 mg via ORAL
  Filled 2017-07-27: qty 4

## 2017-07-27 MED ORDER — CEPHALEXIN 500 MG PO CAPS: 500.0000 mg | ORAL_CAPSULE | Freq: Four times a day (QID) | ORAL | 0 refills | Status: DC

## 2017-07-27 MED ORDER — TETANUS-DIPHTH-ACELL PERTUSSIS 5-2.5-18.5 LF-MCG/0.5 IM SUSP
0.5000 mL | Freq: Once | INTRAMUSCULAR | Status: AC
  Administered 2017-07-27: 0.5 mL via INTRAMUSCULAR
  Filled 2017-07-27: qty 0.5

## 2017-07-27 NOTE — ED Provider Notes (Signed)
[facility] Provider Note: Lowella Dell, MD, FACEP  CSN: [id number] MRN: [id number] ARRIVAL: 07/26/17 at 2146 ROOM: MHFT2/MHFT2   CHIEF COMPLAINT  Foreign Body in Skin   HISTORY OF PRESENT ILLNESS  07/27/17 1:10 AM [name] [name] is a 52 y.o. male with a fishhook in the pad of his right thumb since approximately 8:30 PM.  He has not been able to remove it himself.  He is having moderate to severe pain in that thumb, worse with movement of the hook.  His tetanus is not up-to-date.   Past Medical History:  Diagnosis Date  . Arthritis    in lower back  . Coronary artery disease    a. 02/2008: anterior STEMI s/p DES to prox LAD, b. Myoview 2012: scar in the anterior wall with mild ischemia and EF 33%, c. neg GXT 06/2011  d. 08/2016: cath with DES to diagonal   . Depression with anxiety   . Diabetes mellitus    TYPE II  . Fatty liver    H/O ELEVATED LIVER ENZYMES  . Hyperlipidemia   . Ischemic cardiomyopathy    a. Previous EF 33-35% 2012, b. improved to 45-50% by echo 06/2011  . LV (left ventricular) mural thrombus without MI   . Noncompliance    Prior hx of med noncompliance  . OSA (obstructive sleep apnea)     Past Surgical History:  Procedure Laterality Date  . CORONARY STENT INTERVENTION N/A 08/21/2016   Procedure: CORONARY STENT INTERVENTION;  Surgeon: Kathleene Hazel, MD;  [facility]: [facility];  Service: Cardiovascular;  Laterality: N/A;  . INTRAVASCULAR PRESSURE WIRE/FFR STUDY N/A 08/21/2016   Procedure: INTRAVASCULAR PRESSURE WIRE/FFR STUDY;  Surgeon: Kathleene Hazel, MD;  Location: [facility];  Service: Cardiovascular;  Laterality: N/A;  . LIVER BIOPSY    . MI WITH STENTS    . NASAL SINUS SURGERY    . RIGHT/LEFT HEART CATH AND CORONARY ANGIOGRAPHY N/A 08/21/2016   Procedure: Right/Left Heart Cath and Coronary Angiography;  Surgeon: Kathleene Hazel, MD;  Location: [facility] INVASIVE CV LAB;  Service: Cardiovascular;  Laterality:  N/A;    Family History  Problem Relation Age of Onset  . Coronary artery disease Unknown        UNKNOWN  . Heart disease Father        CAD s/p CABG  . Diabetes Mellitus II Father   . Diabetes Mellitus II Mother   . Cancer Brother     Social History   Tobacco Use  . Smoking status: Never Smoker  . Smokeless tobacco: Never Used  Substance Use Topics  . Alcohol use: No  . Drug use: No    Prior to Admission medications   Medication Sig Start Date End Date Taking? Authorizing Provider  aspirin EC 81 MG tablet Take 81 mg by mouth daily.    [provider]  atorvastatin (LIPITOR) 80 MG tablet TAKE 1 TABLET BY MOUTH ONCE DAILY 05/24/17   Antoine Poche, MD  carvedilol (COREG) 12.5 MG tablet Take 1.5 tablets (18.75 mg total) by mouth 2 (two) times daily with a meal. 06/02/17   Antoine Poche, MD  clopidogrel (PLAVIX) 75 MG tablet Take 1 tablet (75 mg total) by mouth daily with breakfast. 08/23/16   Janetta Hora, PA-C  Ertugliflozin L-PyroglutamicAc (STEGLATRO) 5 MG TABS Take 5 mg by mouth.    [provider]  lisinopril (PRINIVIL,ZESTRIL) 40 MG tablet Take 40 mg by mouth daily.  [provider]  metFORMIN (GLUCOPHAGE) 1000 MG tablet Take 1,000 mg by mouth 2 (two) times daily with a meal.    [provider]  nitroGLYCERIN (NITROSTAT) 0.4 MG SL tablet Place 0.4 mg under the tongue every 5 (five) minutes as needed for chest pain.    [provider]  sildenafil (REVATIO) 20 MG tablet Take 20 mg by mouth daily as needed.     [provider]  topiramate (TOPAMAX) 25 MG capsule Take 25 mg by mouth 2 (two) times daily.    [provider]    Allergies Patient has no known allergies.   REVIEW OF SYSTEMS  Negative except as noted here or in the History of Present Illness.   PHYSICAL EXAMINATION  Initial Vital Signs Blood pressure (!) 146/97, pulse 88, temperature 98.1 F (36.7 C), temperature source Oral, resp.  rate 20, height 5\' 10"  (1.778 m), weight 107.4 kg (236 lb 12.4 oz), SpO2 96 %.  Examination General: Well-developed, well-nourished male in no acute distress; appearance consistent with age of record HENT: normocephalic; atraumatic Eyes: Normal appearance Neck: supple Heart: regular rate and rhythm Lungs: clear to auscultation bilaterally Abdomen: soft; nondistended Extremities: No deformity; full range of motion Neurologic: Awake, alert and oriented; motor function intact in all extremities and symmetric; no facial droop Skin: Warm and dry; one tine of treble hook in pad of right thumb Psychiatric: Normal mood and affect   RESULTS  Summary of this visit's results, reviewed by myself:   EKG Interpretation  Date/Time:    Ventricular Rate:    PR Interval:    QRS Duration:   QT Interval:    QTC Calculation:   R Axis:     Text Interpretation:        Laboratory Studies: No results found for this or any previous visit (from the past 24 hour(s)). Imaging Studies: No results found.  ED COURSE and MDM  Nursing notes and initial vitals signs, including pulse oximetry, reviewed.  Vitals:   07/26/17 2153 07/26/17 2154  BP: (!) 146/97   Pulse: 88   Resp: 20   Temp: 98.1 F (36.7 C)   TempSrc: Oral   SpO2: 96%   Weight:  107.4 kg (236 lb 12.4 oz)  Height:  5\' 10"  (1.778 m)    PROCEDURES   FISHHOOK REMOVAL The patient's right thumb was prepped and draped in usual sterile fashion.  Fishhook was first cut away from the attached Luer with wire cutters.  The skin surrounding the remaining piece of fishhook was anesthetized with approximately 1.5 mL of 2% lidocaine without epinephrine.  A small, 2 to 3 mm incision was made adjacent to the fishhook and the fascia and barb removed with forceps.  The patient tolerated this well and there were no immediate complications.  The wound was left open to heal secondarily.  ED DIAGNOSES     ICD-10-CM   1. Fish hook injury of right  thumb, initial encounter J85.63JS        Paula Libra, MD 07/27/17 [phone]

## 2017-08-02 ENCOUNTER — Ambulatory Visit (INDEPENDENT_AMBULATORY_CARE_PROVIDER_SITE_OTHER): Payer: Self-pay | Admitting: Cardiology

## 2017-08-02 ENCOUNTER — Encounter: Payer: Self-pay | Admitting: *Deleted

## 2017-08-02 VITALS — BP 133/87 | HR 76 | Ht 70.0 in | Wt 238.6 lb

## 2017-08-02 DIAGNOSIS — I2510 Atherosclerotic heart disease of native coronary artery without angina pectoris: Secondary | ICD-10-CM

## 2017-08-02 DIAGNOSIS — R079 Chest pain, unspecified: Secondary | ICD-10-CM

## 2017-08-02 DIAGNOSIS — I10 Essential (primary) hypertension: Secondary | ICD-10-CM

## 2017-08-02 DIAGNOSIS — I255 Ischemic cardiomyopathy: Secondary | ICD-10-CM

## 2017-08-02 DIAGNOSIS — I5022 Chronic systolic (congestive) heart failure: Secondary | ICD-10-CM

## 2017-08-02 MED ORDER — CARVEDILOL 25 MG PO TABS: 25.0000 mg | ORAL_TABLET | Freq: Two times a day (BID) | ORAL | 6 refills | Status: DC

## 2017-08-02 NOTE — Progress Notes (Signed)
Clinical Summary Mr. Deason is a 52 y.o.male seen today for follow up of the following medical problems.   1. CAD/ICM/Chronic systolic HF - history of anterior STEMI 02/2008, received DES to LAD. LVEF at that time was 35% - 2012 myoview anterior scar with mild ischemia - 06/2011 echo LVEF 45-50% - 06/2011 GXT no ischemia - 07/2016 echo shows LVEF down to 30-35%, diffuse hypokinesis with akinesis of anerior and anteroseptal walls. Evidence of LV thrombus - with drop in LVEF and ongoing chest pain was referred for cath last visit 08/2016 cath: see report below, received DES to D2 99%. Ok to stop ASA once coumadin is therapeutic per interventional cardiology, continue plavix.  11/2016 echo LVEF 35%, apical thrombus - 02/2017 echo LVEF 35-40%, restrictive, no clear thrombus      - last visit we increased coreg to 18.75mg  bid - midchest pressure at times, typically at rest. Comes and goes, lasts 15-20 minutes. Can have some SOB associated. 3-4/10. Not positional. Highest level of activity is walking his dog, notes some increased DOE/fatigue which is stable. - no relation to food. Often comes on with stress.  - no recent edema. Not checking weights. Limiting sodium intake.    2. LV thrombus - noted on 11/2016 echo -repeat echo 02/2017 showed resolution of clot, coumadin was stopped.    3. HTN - he is compliant with meds   SH: looking for job currently, unemployed. Lives with elderly mother.     Past Medical History:  Diagnosis Date  . Arthritis    in lower back  . Coronary artery disease    a. 02/2008: anterior STEMI s/p DES to prox LAD, b. Myoview 2012: scar in the anterior wall with mild ischemia and EF 33%, c. neg GXT 06/2011  d. 08/2016: cath with DES to diagonal   . Depression with anxiety   . Diabetes mellitus    TYPE II  . Fatty liver    H/O ELEVATED LIVER ENZYMES  . Hyperlipidemia   . Ischemic cardiomyopathy    a. Previous EF 33-35% 2012, b. improved to  45-50% by echo 06/2011  . LV (left ventricular) mural thrombus without MI   . Noncompliance    Prior hx of med noncompliance  . OSA (obstructive sleep apnea)      No Known Allergies   Current Outpatient Medications  Medication Sig Dispense Refill  . aspirin EC 81 MG tablet Take 81 mg by mouth daily.    Marland Kitchen atorvastatin (LIPITOR) 80 MG tablet TAKE 1 TABLET BY MOUTH ONCE DAILY 90 tablet 0  . carvedilol (COREG) 12.5 MG tablet Take 1.5 tablets (18.75 mg total) by mouth 2 (two) times daily with a meal. 270 tablet 1  . cephALEXin (KEFLEX) 500 MG capsule Take 1 capsule (500 mg total) by mouth 4 (four) times daily. 20 capsule 0  . clopidogrel (PLAVIX) 75 MG tablet Take 1 tablet (75 mg total) by mouth daily with breakfast. 90 tablet 3  . Ertugliflozin L-PyroglutamicAc (STEGLATRO) 5 MG TABS Take 5 mg by mouth.    Marland Kitchen lisinopril (PRINIVIL,ZESTRIL) 40 MG tablet Take 40 mg by mouth daily.    . metFORMIN (GLUCOPHAGE) 1000 MG tablet Take 1,000 mg by mouth 2 (two) times daily with a meal.    . nitroGLYCERIN (NITROSTAT) 0.4 MG SL tablet Place 0.4 mg under the tongue every 5 (five) minutes as needed for chest pain.    . sildenafil (REVATIO) 20 MG tablet Take 20 mg by mouth daily as  needed.     . topiramate (TOPAMAX) 25 MG capsule Take 25 mg by mouth 2 (two) times daily.     No current facility-administered medications for this visit.      Past Surgical History:  Procedure Laterality Date  . CORONARY STENT INTERVENTION N/A 08/21/2016   Procedure: CORONARY STENT INTERVENTION;  Surgeon: Kathleene Hazel, MD;  Location: [facility];  Service: Cardiovascular;  Laterality: N/A;  . INTRAVASCULAR PRESSURE WIRE/FFR STUDY N/A 08/21/2016   Procedure: INTRAVASCULAR PRESSURE WIRE/FFR STUDY;  Surgeon: Kathleene Hazel, MD;  Location: [facility];  Service: Cardiovascular;  Laterality: N/A;  . LIVER BIOPSY    . MI WITH STENTS    . NASAL SINUS SURGERY    . RIGHT/LEFT HEART CATH AND CORONARY  ANGIOGRAPHY N/A 08/21/2016   Procedure: Right/Left Heart Cath and Coronary Angiography;  Surgeon: Kathleene Hazel, MD;  Location: [facility] INVASIVE CV LAB;  Service: Cardiovascular;  Laterality: N/A;     No Known Allergies    Family History  Problem Relation Age of Onset  . Coronary artery disease Unknown        UNKNOWN  . Heart disease Father        CAD s/p CABG  . Diabetes Mellitus II Father   . Diabetes Mellitus II Mother   . Cancer Brother      Social History Mr. Ron reports that he has never smoked. He has never used smokeless tobacco. Mr. Seder reports that he does not drink alcohol.   Review of Systems CONSTITUTIONAL: No weight loss, fever, chills, weakness or fatigue.  HEENT: Eyes: No visual loss, blurred vision, double vision or yellow sclerae.No hearing loss, sneezing, congestion, runny nose or sore throat.  SKIN: No rash or itching.  CARDIOVASCULAR: per hpi RESPIRATORY: [per hpi  GASTROINTESTINAL: No anorexia, nausea, vomiting or diarrhea. No abdominal pain or blood.  GENITOURINARY: No burning on urination, no polyuria NEUROLOGICAL: No headache, dizziness, syncope, paralysis, ataxia, numbness or tingling in the extremities. No change in bowel or bladder control.  MUSCULOSKELETAL: No muscle, back pain, joint pain or stiffness.  LYMPHATICS: No enlarged nodes. No history of splenectomy.  PSYCHIATRIC: No history of depression or anxiety.  ENDOCRINOLOGIC: No reports of sweating, cold or heat intolerance. No polyuria or polydipsia.  Marland Kitchen   Physical Examination Vitals:   08/02/17 0806  BP: 133/87  Pulse: 76  SpO2: 99%   Vitals:   08/02/17 0806  Weight: 238 lb 9.6 oz (108.2 kg)  Height: 5\' 10"  (1.778 m)    Gen: resting comfortably, no acute distress HEENT: no scleral icterus, pupils equal round and reactive, no palptable cervical adenopathy,  CV: RRR, no m/r/g, no jvd Resp: Clear to auscultation bilaterally GI: abdomen is soft, non-tender, non-distended,  normal bowel sounds, no hepatosplenomegaly MSK: extremities are warm, no edema.  Skin: warm, no rash Neuro:  no focal deficits Psych: appropriate affect   Diagnostic Studies  6./2013 echo Study Conclusions  - Left ventricle: Systolic function was mildly reduced. The estimated ejection fraction was in the range of 45% to 50%. - Left atrium: The atrium was mildly dilated.  08/07/16 echo Study Conclusions  - Left ventricle: LV systolic function is depressed at approximately 30 to 35% with severe hypokinesis/akinesis of the inferior, septal, distal lateral and apical walls. Cannot exclude mural thrombus at apex. Suggest limited echo with Definity to define The cavity size was normal. Wall thickness was normal. - Mitral valve: There was mild regurgitation. - Left atrium: The atrium  was mildly dilated.  08/14/16 echo Study Conclusions  - Procedure narrative: Transthoracic echocardiography. Image quality was adequate. The study was technically difficult. Intravenous contrast (Definity) was administered. - Left ventricle: Systolic function was moderately to severely reduced. The estimated ejection fraction was in the range of 30% to 35%. Diffuse hypokinesis. There is akinesis of the anteroseptal, anterior, and apical myocardium. - Right ventricle: Systolic function was normal.  Impressions:  - Limited study with Definity contrast. LVEF is in the range of 30-35%. There is a loosely organized LV apical thrombus noted. Diffuse hypokinesis overall with akinesis of the anterior, anteroseptal, and apical myocardium. RV contraction normal based on limited views.  08/2016 cath  Mid RCA-2 lesion, 30 %stenosed.  Mid RCA-1 lesion, 30 %stenosed.  Ost Ramus to Ramus lesion, 20 %stenosed.  Ost LAD to Prox LAD lesion, 10 %stenosed.  Prox LAD to Mid LAD lesion, 60 %stenosed.  A STENT SYNERGY DES 2.25X12 drug eluting stent was successfully  placed.  2nd Diag lesion, 99 %stenosed.  Post intervention, there is a 0% residual stenosis.  1. Patent stent proximal LAD 2. Moderate stenosis mid LAD. FFR was 0.84 suggesting the stenosis was not flow limiting.  3. The Diagonal Sujata Maines is a moderate caliber vessel with a 99% stenosis.  4. Successful PTCA/DES x 1 Diagonal Malynda Smolinski 5. Mild disease RCA  Recommendations: Will continue ASA and Plavix for now. He will need to be started on coumadin tomorrow before discharge for his LV thrombus. He can stop ASA when his INR is therapeutic on coumadin since he was not an ACS. I would plan long term coumadin for LV thrombus and Plavix for at least one year given placement of DES. Continue statin and beta blocker.  02/2017 echo Study Conclusions  - Left ventricle: The cavity size was normal. Wall thickness was increased in a pattern of mild LVH. Systolic function was moderately reduced. The estimated ejection fraction was in the range of 35% to 40%. No definitive thrombus seen with contrast enhancement. Diffuse hypokinesis. Diastolic dysfunction with restrictive physiology. - Regional wall motion abnormality: Akinesis of the mid anterior and basal-mid anteroseptal myocardium. - Right ventricle: Systolic function was reduced.    Assessment and Plan   1. CAD/ICM/chronic systolic HF -medical therapy limited by orthostatic symptoms - paying out of pocket for meds, work to limit costs - recent chest pain not overally specific for cardiac cause. Increase coreg to 25mg  bid and monitor symtoms - stop plavix 08/2017, continue ASA - EKG today SR, no acute ischemic changes  2. HTN - mildly above goal, follow with coreg increase.    F/u3 months      Antoine Poche, M.D.

## 2017-08-02 NOTE — Patient Instructions (Signed)
Medication Instructions:   Increase Coreg to 25mg  twice a day.  Stop Plavix on 08/21/2017.  Continue all other medications.    Labwork: none  Testing/Procedures: none  Follow-Up: 3 months   Any Other Special Instructions Will Be Listed Below (If Applicable).  If you need a refill on your cardiac medications before your next appointment, please call your pharmacy.

## 2017-09-29 ENCOUNTER — Encounter (HOSPITAL_BASED_OUTPATIENT_CLINIC_OR_DEPARTMENT_OTHER): Payer: Self-pay

## 2017-09-29 ENCOUNTER — Emergency Department (HOSPITAL_BASED_OUTPATIENT_CLINIC_OR_DEPARTMENT_OTHER)
Admission: EM | Admit: 2017-09-29 | Discharge: 2017-09-29 | Disposition: A | Discharge status: Home / Self Care | Payer: Self-pay | Attending: Emergency Medicine | Admitting: Emergency Medicine

## 2017-09-29 ENCOUNTER — Other Ambulatory Visit: Payer: Self-pay

## 2017-09-29 DIAGNOSIS — Y939 Activity, unspecified: Secondary | ICD-10-CM | POA: Insufficient documentation

## 2017-09-29 DIAGNOSIS — Y929 Unspecified place or not applicable: Secondary | ICD-10-CM | POA: Insufficient documentation

## 2017-09-29 DIAGNOSIS — Y999 Unspecified external cause status: Secondary | ICD-10-CM | POA: Insufficient documentation

## 2017-09-29 DIAGNOSIS — Z7984 Long term (current) use of oral hypoglycemic drugs: Secondary | ICD-10-CM | POA: Insufficient documentation

## 2017-09-29 DIAGNOSIS — Z955 Presence of coronary angioplasty implant and graft: Secondary | ICD-10-CM | POA: Insufficient documentation

## 2017-09-29 DIAGNOSIS — E119 Type 2 diabetes mellitus without complications: Secondary | ICD-10-CM | POA: Insufficient documentation

## 2017-09-29 DIAGNOSIS — Z7902 Long term (current) use of antithrombotics/antiplatelets: Secondary | ICD-10-CM | POA: Insufficient documentation

## 2017-09-29 DIAGNOSIS — W458XXA Other foreign body or object entering through skin, initial encounter: Secondary | ICD-10-CM | POA: Insufficient documentation

## 2017-09-29 DIAGNOSIS — I2510 Atherosclerotic heart disease of native coronary artery without angina pectoris: Secondary | ICD-10-CM | POA: Insufficient documentation

## 2017-09-29 DIAGNOSIS — Z79899 Other long term (current) drug therapy: Secondary | ICD-10-CM | POA: Insufficient documentation

## 2017-09-29 DIAGNOSIS — Z7982 Long term (current) use of aspirin: Secondary | ICD-10-CM | POA: Insufficient documentation

## 2017-09-29 DIAGNOSIS — S60851A Superficial foreign body of right wrist, initial encounter: Secondary | ICD-10-CM | POA: Insufficient documentation

## 2017-09-29 MED ORDER — LIDOCAINE-EPINEPHRINE (PF) 2 %-1:200000 IJ SOLN
10.0000 mL | Freq: Once | INTRAMUSCULAR | Status: AC
  Administered 2017-09-29: 10 mL
  Filled 2017-09-29 (×2): qty 10

## 2017-09-29 NOTE — ED Provider Notes (Signed)
[facility] EMERGENCY DEPARTMENT Provider Note   CSN: [id number] Arrival date & time: 09/29/17  2048     History   Chief Complaint Chief Complaint  Patient presents with  . Foreign Body in Skin    HPI [name] [name] is a 52 y.o. male.  He presents emergency department with a fishhook in his right wrist.  It happened around 7 PM tonight.  His tetanus is up-to-date from the last time he had a fishhook stuck in 2 months ago.  There is a trouble hook with actually 2 hooks embedded in the skin and is ventral wrist.  No numbness no weakness.  No active bleeding.  Minimal pain sharp in nature when the object is moved.  The history is provided by the patient.  Foreign Body  The current episode started 3 to 5 hours ago. Intake: right wrist. Suspected object: fishook. Pertinent negatives include no fever, no sore throat, no chest pain and no abdominal pain. Associated medical issues include prior foreign body removal.    Past Medical History:  Diagnosis Date  . Arthritis    in lower back  . Coronary artery disease    a. 02/2008: anterior STEMI s/p DES to prox LAD, b. Myoview 2012: scar in the anterior wall with mild ischemia and EF 33%, c. neg GXT 06/2011  d. 08/2016: cath with DES to diagonal   . Depression with anxiety   . Diabetes mellitus    TYPE II  . Fatty liver    H/O ELEVATED LIVER ENZYMES  . Hyperlipidemia   . Ischemic cardiomyopathy    a. Previous EF 33-35% 2012, b. improved to 45-50% by echo 06/2011  . LV (left ventricular) mural thrombus without MI   . Noncompliance    Prior hx of med noncompliance  . OSA (obstructive sleep apnea)     Patient Active Problem List   Diagnosis Date Noted  . Type 2 diabetes mellitus with complication (HCC)   . OSA (obstructive sleep apnea)   . Unstable angina (HCC)   . Abnormal LFTs (liver function tests) 07/07/2011  . Cardiomyopathy, ischemic 04/24/2010  . Mixed hyperlipidemia 09/27/2008  . Essential hypertension, benign  09/27/2008  . CORONARY ATHEROSCLEROSIS NATIVE CORONARY ARTERY 09/27/2008    Past Surgical History:  Procedure Laterality Date  . CORONARY STENT INTERVENTION N/A 08/21/2016   Procedure: CORONARY STENT INTERVENTION;  Surgeon: Kathleene Hazel, MD;  Location: [facility] INVASIVE CV LAB;  Service: Cardiovascular;  Laterality: N/A;  . INTRAVASCULAR PRESSURE WIRE/FFR STUDY N/A 08/21/2016   Procedure: INTRAVASCULAR PRESSURE WIRE/FFR STUDY;  Surgeon: Kathleene Hazel, MD;  Location: [facility] INVASIVE CV LAB;  Service: Cardiovascular;  Laterality: N/A;  . LIVER BIOPSY    . MI WITH STENTS    . NASAL SINUS SURGERY    . RIGHT/LEFT HEART CATH AND CORONARY ANGIOGRAPHY N/A 08/21/2016   Procedure: Right/Left Heart Cath and Coronary Angiography;  Surgeon: Kathleene Hazel, MD;  Location: [facility] INVASIVE CV LAB;  Service: Cardiovascular;  Laterality: N/A;        Home Medications    Prior to Admission medications   Medication Sig Start Date End Date Taking? Authorizing Provider  aspirin EC 81 MG tablet Take 81 mg by mouth daily.    [provider]  atorvastatin (LIPITOR) 80 MG tablet TAKE 1 TABLET BY MOUTH ONCE DAILY 05/24/17   Antoine Poche, MD  carvedilol (COREG) 25 MG tablet Take 1 tablet (25 mg total) by mouth 2 (two) times daily with a meal.  08/02/17   Antoine Poche, MD  clopidogrel (PLAVIX) 75 MG tablet Take 1 tablet (75 mg total) by mouth daily with breakfast. 08/23/16   Janetta Hora, PA-C  Ertugliflozin L-PyroglutamicAc (STEGLATRO) 5 MG TABS Take 5 mg by mouth.    [provider]  lisinopril (PRINIVIL,ZESTRIL) 40 MG tablet Take 40 mg by mouth daily.    [provider]  metFORMIN (GLUCOPHAGE) 1000 MG tablet Take 1,000 mg by mouth 2 (two) times daily with a meal.    [provider]  nitroGLYCERIN (NITROSTAT) 0.4 MG SL tablet Place 0.4 mg under the tongue every 5 (five) minutes as needed for chest pain.    [provider]  sildenafil (REVATIO)  20 MG tablet Take 20 mg by mouth daily as needed.     [provider]  topiramate (TOPAMAX) 25 MG capsule Take 25 mg by mouth 2 (two) times daily.    [provider]    Family History Family History  Problem Relation Age of Onset  . Coronary artery disease Unknown        UNKNOWN  . Heart disease Father        CAD s/p CABG  . Diabetes Mellitus II Father   . Diabetes Mellitus II Mother   . Cancer Brother     Social History Social History   Tobacco Use  . Smoking status: Never Smoker  . Smokeless tobacco: Never Used  Substance Use Topics  . Alcohol use: No  . Drug use: No     Allergies   Patient has no known allergies.   Review of Systems Review of Systems  Constitutional: Negative for fever.  HENT: Negative for sore throat.   Respiratory: Negative for shortness of breath.   Cardiovascular: Negative for chest pain.  Gastrointestinal: Negative for abdominal pain.  Genitourinary: Negative for dysuria.  Skin: Negative for rash.     Physical Exam Updated Vital Signs BP (!) 139/100 (BP Location: Left Arm)   Pulse 78   Temp 98.4 F (36.9 C) (Oral)   Resp 20   Ht 5\' 10"  (1.778 m)   Wt 107.6 kg   SpO2 98%   BMI 34.04 kg/m   Physical Exam  Constitutional: He appears well-developed and well-nourished.  HENT:  Head: Normocephalic and atraumatic.  Eyes: Conjunctivae are normal.  Neck: Neck supple.  Pulmonary/Chest: Effort normal.  Musculoskeletal: Normal range of motion. He exhibits no deformity.  Right wrist on the ventral side has 2 fishhooks impaled from a trouble hook.  No active bleeding.  Distal neurovascular intact.  Neurological: He is alert. GCS eye subscore is 4. GCS verbal subscore is 5. GCS motor subscore is 6.  Skin: Skin is warm and dry. Capillary refill takes less than 2 seconds.  Psychiatric: He has a normal mood and affect.  Nursing note and vitals reviewed.    ED Treatments / Results  Labs (all labs ordered are listed,  but only abnormal results are displayed) Labs Reviewed - No data to display  EKG None  Radiology No results found.  Procedures .Foreign Body Removal Date/Time: 09/29/2017 11:19 PM Performed by: Terrilee Files, MD Authorized by: Terrilee Files, MD  Consent: Verbal consent obtained. Risks and benefits: risks, benefits and alternatives were discussed Consent given by: patient Patient understanding: patient states understanding of the procedure being performed Patient consent: the patient's understanding of the procedure matches consent given Procedure consent: procedure consent matches procedure scheduled Patient identity confirmed: verbally with patient Body area: skin  General location: upper extremity Location details: right wrist Anesthesia: local infiltration  Anesthesia: Local Anesthetic: lidocaine 2% with epinephrine  Sedation: Patient sedated: no  Patient restrained: no Localization method: visualized Removal mechanism: hemostat and scalpel Dressing: antibiotic ointment Tendon involvement: none Depth: subcutaneous Complexity: simple 1 objects recovered. Objects recovered: treble fishook Post-procedure assessment: foreign body removed Patient tolerance: Patient tolerated the procedure well with no immediate complications   (including critical care time)  Medications Ordered in ED Medications  lidocaine-EPINEPHrine (XYLOCAINE W/EPI) 2 %-1:200000 (PF) injection 10 mL (has no administration in time range)     Initial Impression / Assessment and Plan / ED Course  I have reviewed the triage vital signs and the nursing notes.  Pertinent labs & imaging results that were available during my care of the patient were reviewed by me and considered in my medical decision making (see chart for details).       Final Clinical Impressions(s) / ED Diagnoses   Final diagnoses:  Acute foreign body of right wrist, initial encounter    ED Discharge Orders     None       Terrilee Files, MD 09/29/17 2320

## 2017-09-29 NOTE — ED Triage Notes (Signed)
Pt with fishhook right UE approx 7pm-NAD-steady gait

## 2017-09-29 NOTE — Discharge Instructions (Addendum)
You were evaluated in the emergency department for a fishhook embedded in your right wrist.  We had to open the skin in the area to remove the fishhook so there was some bleeding.  This should improve over time but please keep an eye out for any signs of infection.  Use soap and water to the area.  Return if any problems.

## 2017-11-09 ENCOUNTER — Ambulatory Visit: Payer: Self-pay | Admitting: Cardiology

## 2017-11-11 ENCOUNTER — Encounter: Payer: Self-pay | Admitting: *Deleted

## 2017-11-12 ENCOUNTER — Encounter: Payer: Self-pay | Admitting: Cardiology

## 2017-11-12 ENCOUNTER — Ambulatory Visit (INDEPENDENT_AMBULATORY_CARE_PROVIDER_SITE_OTHER): Payer: Self-pay | Admitting: Cardiology

## 2017-11-12 VITALS — BP 128/86 | HR 83 | Ht 70.0 in | Wt 234.4 lb

## 2017-11-12 DIAGNOSIS — I5022 Chronic systolic (congestive) heart failure: Secondary | ICD-10-CM

## 2017-11-12 DIAGNOSIS — I2510 Atherosclerotic heart disease of native coronary artery without angina pectoris: Secondary | ICD-10-CM

## 2017-11-12 DIAGNOSIS — R0789 Other chest pain: Secondary | ICD-10-CM

## 2017-11-12 DIAGNOSIS — I10 Essential (primary) hypertension: Secondary | ICD-10-CM

## 2017-11-12 MED ORDER — CARVEDILOL 12.5 MG PO TABS: 18.7500 mg | ORAL_TABLET | Freq: Two times a day (BID) | ORAL | 1 refills | Status: DC

## 2017-11-12 NOTE — Patient Instructions (Signed)
Your physician recommends that you schedule a follow-up appointment in: PENDING WHEN WE CAN SCHEDULE STRESS TEST   Your physician has recommended you make the following change in your medication:   CHANGE COREG 18.75 (1 AND 1/2) TWICE DAILY   PLEASE LET us KNOW REGARDING SCHEDULING A STRESS TEST - WE WILL GIVE YOU INFORMATION  Thank you for choosing [facility]!!

## 2017-11-12 NOTE — Progress Notes (Signed)
Clinical Summary Mr. Clinkscales is a 52 y.o.male  seen today for follow up of the following medical problems.   1. CAD/ICM/Chronic systolic HF - history of anterior STEMI 02/2008, received DES to LAD. LVEF at that time was 35% - 2012 myoview anterior scar with mild ischemia - 06/2011 echo LVEF 45-50% - 06/2011 GXT no ischemia - 07/2016 echo shows LVEF down to 30-35%, diffuse hypokinesis with akinesis of anerior and anteroseptal walls. Evidence of LV thrombus - with drop in LVEF and ongoing chest pain was referred for cath  08/2016 cath: see report below, received DES to D2 99%. Ok to stop ASA once coumadin is therapeutic per interventional cardiology, continue plavix.  11/2016 echo LVEF 35%, apical thrombus - 02/2017 echo LVEF 35-40%, restrictive, no clear thrombus   - recent chest pressure. Throbbing/sharp pain, left sided. 2/10 in severity. Can occur at anytime.  - pressure pain constant, always present,mild. Not positional. Can have some associated SOB. Faint cough, nonproductive - increased DOE with activities. No recent edema.  - compliant with meds   2. LV thrombus - noted on11/2018 echo -repeat echo 02/2017 showed resolution of clot, coumadin was stopped.   3. HTN - remains compliant with meds   4. OSA - has prior diagnosis. Not wearing cpap due to discomfort. SIgnificant daytime somonlence.  - pcp referred again for sleep study but could not afford.   SH: looking for jobcurrently, unemployed. Lives with elderly mother.     Past Medical History:  Diagnosis Date  . Arthritis    in lower back  . Coronary artery disease    a. 02/2008: anterior STEMI s/p DES to prox LAD, b. Myoview 2012: scar in the anterior wall with mild ischemia and EF 33%, c. neg GXT 06/2011  d. 08/2016: cath with DES to diagonal   . Depression with anxiety   . Diabetes mellitus    TYPE II  . Fatty liver    H/O ELEVATED LIVER ENZYMES  . Hyperlipidemia   . Ischemic cardiomyopathy    a.  Previous EF 33-35% 2012, b. improved to 45-50% by echo 06/2011  . LV (left ventricular) mural thrombus without MI   . Noncompliance    Prior hx of med noncompliance  . OSA (obstructive sleep apnea)      No Known Allergies   Current Outpatient Medications  Medication Sig Dispense Refill  . aspirin EC 81 MG tablet Take 81 mg by mouth daily.    Marland Kitchen atorvastatin (LIPITOR) 80 MG tablet TAKE 1 TABLET BY MOUTH ONCE DAILY 90 tablet 0  . carvedilol (COREG) 25 MG tablet Take 1 tablet (25 mg total) by mouth 2 (two) times daily with a meal. 60 tablet 6  . clopidogrel (PLAVIX) 75 MG tablet Take 1 tablet (75 mg total) by mouth daily with breakfast. 90 tablet 3  . Ertugliflozin L-PyroglutamicAc (STEGLATRO) 5 MG TABS Take 5 mg by mouth.    Marland Kitchen lisinopril (PRINIVIL,ZESTRIL) 40 MG tablet Take 40 mg by mouth daily.    . metFORMIN (GLUCOPHAGE) 1000 MG tablet Take 1,000 mg by mouth 2 (two) times daily with a meal.    . nitroGLYCERIN (NITROSTAT) 0.4 MG SL tablet Place 0.4 mg under the tongue every 5 (five) minutes as needed for chest pain.    . sildenafil (REVATIO) 20 MG tablet Take 20 mg by mouth daily as needed.     . topiramate (TOPAMAX) 25 MG capsule Take 25 mg by mouth 2 (two) times daily.  No current facility-administered medications for this visit.      Past Surgical History:  Procedure Laterality Date  . CORONARY STENT INTERVENTION N/A 08/21/2016   Procedure: CORONARY STENT INTERVENTION;  Surgeon: Kathleene Hazel, MD;  Location: [facility] INVASIVE CV [facility];  Service: Cardiovascular;  Laterality: N/A;  . INTRAVASCULAR PRESSURE WIRE/FFR STUDY N/A 08/21/2016   Procedure: INTRAVASCULAR PRESSURE WIRE/FFR STUDY;  Surgeon: Kathleene Hazel, MD;  Location: [facility]VASIVE CV [facility];  Service: Cardiovascular;  Laterality: N/A;  . LIVER BIOPSY    . MI WITH STENTS    . NASAL SINUS SURGERY    . RIGHT/LEFT HEART CATH AND CORONARY ANGIOGRAPHY N/A 08/21/2016   Procedure: Right/Left Heart Cath and Coronary  Angiography;  Surgeon: Kathleene Hazel, MD;  Location: [facility] INVASIVE CV LAB;  Service: Cardiovascular;  Laterality: N/A;     No Known Allergies    Family History  Problem Relation Age of Onset  . Coronary artery disease Unknown        UNKNOWN  . Heart disease Father        CAD s/p CABG  . Diabetes Mellitus II Father   . Diabetes Mellitus II Mother   . Cancer Brother      Social History Mr. Keimig reports that he has never smoked. He has never used smokeless tobacco. Mr. Pore reports that he does not drink alcohol.   Review of Systems CONSTITUTIONAL: +generalized fatigue HEENT: Eyes: No visual loss, blurred vision, double vision or yellow sclerae.No hearing loss, sneezing, congestion, runny nose or sore throat.  SKIN: No rash or itching.  CARDIOVASCULAR: per hpi RESPIRATORY: per hpi GASTROINTESTINAL: No anorexia, nausea, vomiting or diarrhea. No abdominal pain or blood.  GENITOURINARY: No burning on urination, no polyuria NEUROLOGICAL: No headache, dizziness, syncope, paralysis, ataxia, numbness or tingling in the extremities. No change in bowel or bladder control.  MUSCULOSKELETAL: No muscle, back pain, joint pain or stiffness.  LYMPHATICS: No enlarged nodes. No history of splenectomy.  PSYCHIATRIC: No history of depression or anxiety.  ENDOCRINOLOGIC: No reports of sweating, cold or heat intolerance. No polyuria or polydipsia.  Marland Kitchen   Physical Examination Vitals:   11/12/17 0931  BP: 128/86  Pulse: 83  SpO2: 98%   Vitals:   11/12/17 0931  Weight: 234 lb 6.4 oz (106.3 kg)  Height: 5\' 10"  (1.778 m)    Gen: resting comfortably, no acute distress HEENT: no scleral icterus, pupils equal round and reactive, no palptable cervical adenopathy,  CV: RRR, no m/r/g, no jvd Resp: Clear to auscultation bilaterally GI: abdomen is soft, non-tender, non-distended, normal bowel sounds, no hepatosplenomegaly MSK: extremities are warm, no edema.  Skin: warm, no rash Neuro:   no focal deficits Psych: appropriate affect   Diagnostic Studies 6./2013 echo Study Conclusions  - Left ventricle: Systolic function was mildly reduced. The estimated ejection fraction was in the range of 45% to 50%. - Left atrium: The atrium was mildly dilated.  08/07/16 echo Study Conclusions  - Left ventricle: LV systolic function is depressed at approximately 30 to 35% with severe hypokinesis/akinesis of the inferior, septal, distal lateral and apical walls. Cannot exclude mural thrombus at apex. Suggest limited echo with Definity to define The cavity size was normal. Wall thickness was normal. - Mitral valve: There was mild regurgitation. - Left atrium: The atrium was mildly dilated.  08/14/16 echo Study Conclusions  - Procedure narrative: Transthoracic echocardiography. Image quality was adequate. The study was technically difficult. Intravenous contrast (Definity) was administered. - Left ventricle: Systolic function  was moderately to severely reduced. The estimated ejection fraction was in the range of 30% to 35%. Diffuse hypokinesis. There is akinesis of the anteroseptal, anterior, and apical myocardium. - Right ventricle: Systolic function was normal.  Impressions:  - Limited study with Definity contrast. LVEF is in the range of 30-35%. There is a loosely organized LV apical thrombus noted. Diffuse hypokinesis overall with akinesis of the anterior, anteroseptal, and apical myocardium. RV contraction normal based on limited views.  08/2016 cath  Mid RCA-2 lesion, 30 %stenosed.  Mid RCA-1 lesion, 30 %stenosed.  Ost Ramus to Ramus lesion, 20 %stenosed.  Ost LAD to Prox LAD lesion, 10 %stenosed.  Prox LAD to Mid LAD lesion, 60 %stenosed.  A STENT SYNERGY DES 2.25X12 drug eluting stent was successfully placed.  2nd Diag lesion, 99 %stenosed.  Post intervention, there is a 0% residual stenosis.  1. Patent stent  proximal LAD 2. Moderate stenosis mid LAD. FFR was 0.84 suggesting the stenosis was not flow limiting.  3. The Diagonal Annalese Stiner is a moderate caliber vessel with a 99% stenosis.  4. Successful PTCA/DES x 1 Diagonal Mykhia Danish 5. Mild disease RCA  Recommendations: Will continue ASA and Plavix for now. He will need to be started on coumadin tomorrow before discharge for his LV thrombus. He can stop ASA when his INR is therapeutic on coumadin since he was not an ACS. I would plan long term coumadin for LV thrombus and Plavix for at least one year given placement of DES. Continue statin and beta blocker.  02/2017 echo Study Conclusions  - Left ventricle: The cavity size was normal. Wall thickness was increased in a pattern of mild LVH. Systolic function was moderately reduced. The estimated ejection fraction was in the range of 35% to 40%. No definitive thrombus seen with contrast enhancement. Diffuse hypokinesis. Diastolic dysfunction with restrictive physiology. - Regional wall motion abnormality: Akinesis of the mid anterior and basal-mid anteroseptal myocardium. - Right ventricle: Systolic function was reduced.     Assessment and Plan  1. CAD/ICM/chronic systolic HF -medical therapy limited by orthostatic symptoms - paying out of pocket for meds, work to limit costs - chest pain somewhat atypical however he has a known history of CAD with fairly recent stenting - recommend lexiscan to further evalaute. He is to look into his medicare to see if active, also we will give him info on [place] assistance. He is to call us next week to update [place] on status, would order lexiscan at that time.  - fatigue most likely due to OSA untreated, however we will try lowering his coreg to previous dose of 18.75mg  bid which was increased last visit.  - with ongoing chest pain continue DAPT at this time.   2. HTN - at goal,continue current meds   F/upending stress results. I told him  specifically to contact us next week to update [place] on insurance/assistance status in order to arrange lexiscan, he reports he understands.       Antoine Poche, M.D.

## 2017-12-14 ENCOUNTER — Other Ambulatory Visit: Payer: Self-pay | Admitting: Physician Assistant

## 2017-12-14 ENCOUNTER — Other Ambulatory Visit: Payer: Self-pay | Admitting: Cardiology

## 2018-01-18 ENCOUNTER — Telehealth: Payer: Self-pay | Admitting: Cardiology

## 2018-01-18 DIAGNOSIS — R079 Chest pain, unspecified: Secondary | ICD-10-CM

## 2018-01-18 NOTE — Telephone Encounter (Signed)
Patient walk in asking about getting a stress test scheduled and making an appointment with Dr Wyline Mood.  York Spaniel that the thought he was told both needed to be done, He will have new insurance come 01/19/2018. [employer] Medicare card scanned in but not added yet

## 2018-01-18 NOTE — Telephone Encounter (Signed)
Per LOV in October pt was to have lexiscan and update [place] on insurance - will place orders and forward to schedulers - appt with Dr Wyline Mood will be pending stress test results

## 2018-01-18 NOTE — Telephone Encounter (Signed)
°  Precert needed for: lexiscan for Chest pain    Location:  [facility]    Date: Feb 02, 2018

## 2018-02-02 ENCOUNTER — Encounter (HOSPITAL_COMMUNITY): Payer: Self-pay

## 2018-02-02 ENCOUNTER — Encounter (HOSPITAL_COMMUNITY)
Admission: RE | Admit: 2018-02-02 | Discharge: 2018-02-02 | Disposition: A | Discharge status: Home / Self Care | Payer: Medicare Other | Source: Ambulatory Visit | Attending: Cardiology | Admitting: Cardiology

## 2018-02-02 ENCOUNTER — Encounter (HOSPITAL_BASED_OUTPATIENT_CLINIC_OR_DEPARTMENT_OTHER)
Admission: RE | Admit: 2018-02-02 | Discharge: 2018-02-02 | Disposition: A | Discharge status: Home / Self Care | Payer: Medicare Other | Source: Ambulatory Visit | Attending: Cardiology | Admitting: Cardiology

## 2018-02-02 DIAGNOSIS — R079 Chest pain, unspecified: Secondary | ICD-10-CM

## 2018-02-02 HISTORY — DX: Essential (primary) hypertension: I10

## 2018-02-02 LAB — NM MYOCAR MULTI W/SPECT W/WALL MOTION / EF
CHL CUP NUCLEAR SRS: 21
CHL CUP NUCLEAR SSS: 23
CSEPPHR: 92 {beats}/min
LV sys vol: 172 mL
LVDIAVOL: 222 mL (ref 62–150)
NUC STRESS TID: 1.02
RATE: 0.17
Rest HR: 80 {beats}/min
SDS: 2

## 2018-02-02 MED ORDER — TECHNETIUM TC 99M TETROFOSMIN IV KIT
10.0000 | PACK | Freq: Once | INTRAVENOUS | Status: AC | PRN
  Administered 2018-02-02: 10.4 via INTRAVENOUS

## 2018-02-02 MED ORDER — REGADENOSON 0.4 MG/5ML IV SOLN
0.4000 mg | Freq: Once | INTRAVENOUS | Status: AC | PRN
  Administered 2018-02-02: 0.4 mg via INTRAVENOUS
  Filled 2018-02-02: qty 5

## 2018-02-02 MED ORDER — TECHNETIUM TC 99M TETROFOSMIN IV KIT
30.0000 | PACK | Freq: Once | INTRAVENOUS | Status: AC | PRN
  Administered 2018-02-02: 29 via INTRAVENOUS

## 2018-02-04 ENCOUNTER — Telehealth: Payer: Self-pay | Admitting: Cardiology

## 2018-02-04 NOTE — Telephone Encounter (Signed)
Patient called requesting test results of recent stress test.   °

## 2018-02-04 NOTE — Telephone Encounter (Signed)
-----   Message from Antoine Poche, MD sent at 02/04/2018 12:14 PM EST ----- Stress does not show any evidence of any new blockages which is a good sign. F/u 6 weeks to reassess symptoms   Dominga Ferry MD

## 2018-02-04 NOTE — Telephone Encounter (Signed)
Pt voiced understanding - 6 week appt made

## 2018-03-22 DIAGNOSIS — J343 Hypertrophy of nasal turbinates: Secondary | ICD-10-CM | POA: Insufficient documentation

## 2018-04-04 ENCOUNTER — Ambulatory Visit: Payer: Medicare Other | Admitting: Cardiology

## 2018-05-16 ENCOUNTER — Telehealth: Payer: Self-pay | Admitting: *Deleted

## 2018-05-16 NOTE — Telephone Encounter (Signed)
   Primary Cardiologist:  [name], MD   Patient contacted.  History reviewed.  No symptoms to suggest any unstable cardiac conditions.  Based on discussion, with current pandemic situation, we will be postponing this appointment for [name] [name]. with a plan for f/u on 08/03/2018.  If symptoms change, he has been instructed to contact our office.

## 2018-05-19 ENCOUNTER — Ambulatory Visit: Payer: Medicare Other | Admitting: Cardiology

## 2018-07-27 ENCOUNTER — Telehealth: Payer: Self-pay | Admitting: Cardiology

## 2018-07-27 NOTE — Telephone Encounter (Signed)
Virtual Visit Pre-Appointment Phone Call  "(Name), I am calling you today to discuss your upcoming appointment. We are currently trying to limit exposure to the virus that causes COVID-19 by seeing patients at home rather than in the office."  1. "What is the BEST phone number to call the day of the visit?" - include this in appointment notes  2. Do you have or have access to (through a family member/friend) a smartphone with video capability that we can use for your visit?" a. If yes - list this number in appt notes as cell (if different from BEST phone #) and list the appointment type as a VIDEO visit in appointment notes b. If no - list the appointment type as a PHONE visit in appointment notes  3. Confirm consent - "In the setting of the current Covid19 crisis, you are scheduled for a (phone or video) visit with your provider on (date) at (time).  Just as we do with many in-office visits, in order for you to participate in this visit, we must obtain consent.  If you'd like, I can send this to your mychart (if signed up) or email for you to review.  Otherwise, I can obtain your verbal consent now.  All virtual visits are billed to your insurance company just like a normal visit would be.  By agreeing to a virtual visit, we'd like you to understand that the technology does not allow for your provider to perform an examination, and thus may limit your provider's ability to fully assess your condition. If your provider identifies any concerns that need to be evaluated in person, we will make arrangements to do so.  Finally, though the technology is pretty good, we cannot assure that it will always work on either your or our end, and in the setting of a video visit, we may have to convert it to a phone-only visit.  In either situation, we cannot ensure that we have a secure connection.  Are you willing to proceed?" STAFF: Did the patient verbally acknowledge consent to telehealth visit? Document  YES/NO here: yes  4. Advise patient to be prepared - "Two hours prior to your appointment, go ahead and check your blood pressure, pulse, oxygen saturation, and your weight (if you have the equipment to check those) and write them all down. When your visit starts, your provider will ask you for this information. If you have an Apple Watch or Kardia device, please plan to have heart rate information ready on the day of your appointment. Please have a pen and paper handy nearby the day of the visit as well."  5. Give patient instructions for MyChart download to smartphone OR [email or web address] as below if video visit (depending on what platform provider is using)  6. Inform patient they will receive a phone call 15 minutes prior to their appointment time (may be from unknown caller ID) so they should be prepared to answer    TELEPHONE CALL NOTE  [name] Dutton. has been deemed a candidate for a follow-up tele-health visit to limit community exposure during the Covid-19 pandemic. I spoke with the patient via phone to ensure availability of phone/video source, confirm preferred email & phone number, and discuss instructions and expectations.  I reminded [name] Dutton. to be prepared with any vital sign and/or heart rhythm information that could potentially be obtained via home monitoring, at the time of his visit. I reminded [name] Dutton. to expect a phone  call prior to his visit.  [name] [name] 07/27/2018 3:00 PM   INSTRUCTIONS FOR DOWNLOADING THE MYCHART APP TO SMARTPHONE  - The patient must first make sure to have activated MyChart and know their login information - If Apple, go to [employer] and type in MyChart in the search bar and download the app. If Android, ask patient to go to [employer] and type in [place] in the search bar and download the app. The app is free but as with any other app downloads, their phone may require them to verify saved payment information or  Apple/Android password.  - The patient will need to then log into the app with their MyChart username and password, and select [facility] as their healthcare provider to link the account. When it is time for your visit, go to the MyChart app, find appointments, and click Begin Video Visit. Be sure to Select Allow for your device to access the Microphone and Camera for your visit. You will then be connected, and your provider will be with you shortly.  **If they have any issues connecting, or need assistance please contact MyChart service desk [phone])**  **If using a computer, in order to ensure the best quality for their visit they will need to use either of the following Internet Browsers: [employer], or Google Chrome**  IF USING DOXIMITY or DOXY.ME - The patient will receive a link just prior to their visit by text.     FULL LENGTH CONSENT FOR TELE-HEALTH VISIT   I hereby voluntarily request, consent and authorize [facility] and its employed or contracted physicians, physician assistants, nurse practitioners or other licensed health care professionals (the Practitioner), to provide me with telemedicine health care services (the Services") as deemed necessary by the treating Practitioner. I acknowledge and consent to receive the Services by the Practitioner via telemedicine. I understand that the telemedicine visit will involve communicating with the Practitioner through live audiovisual communication technology and the disclosure of certain medical information by electronic transmission. I acknowledge that I have been given the opportunity to request an in-person assessment or other available alternative prior to the telemedicine visit and am voluntarily participating in the telemedicine visit.  I understand that I have the right to withhold or withdraw my consent to the use of telemedicine in the course of my care at any time, without affecting my right to future care  or treatment, and that the Practitioner or I may terminate the telemedicine visit at any time. I understand that I have the right to inspect all information obtained and/or recorded in the course of the telemedicine visit and may receive copies of available information for a reasonable fee.  I understand that some of the potential risks of receiving the Services via telemedicine include:   Delay or interruption in medical evaluation due to technological equipment failure or disruption;  Information transmitted may not be sufficient (e.g. poor resolution of images) to allow for appropriate medical decision making by the Practitioner; and/or   In rare instances, security protocols could fail, causing a breach of personal health information.  Furthermore, I acknowledge that it is my responsibility to provide information about my medical history, conditions and care that is complete and accurate to the best of my ability. I acknowledge that Practitioner's advice, recommendations, and/or decision may be based on factors not within their control, such as incomplete or inaccurate data provided by me or distortions of diagnostic images or specimens that may result from  electronic transmissions. I understand that the practice of medicine is not an exact science and that Practitioner makes no warranties or guarantees regarding treatment outcomes. I acknowledge that I will receive a copy of this consent concurrently upon execution via email to the email address I last provided but may also request a printed copy by calling the office of [facility].    I understand that my insurance will be billed for this visit.   I have read or had this consent read to me.  I understand the contents of this consent, which adequately explains the benefits and risks of the Services being provided via telemedicine.   I have been provided ample opportunity to ask questions regarding this consent and the Services and have had  my questions answered to my satisfaction.  I give my informed consent for the services to be provided through the use of telemedicine in my medical care  By participating in this telemedicine visit I agree to the above.

## 2018-08-02 ENCOUNTER — Encounter: Payer: Self-pay | Admitting: *Deleted

## 2018-08-03 ENCOUNTER — Encounter: Payer: Self-pay | Admitting: Cardiology

## 2018-08-03 ENCOUNTER — Telehealth (INDEPENDENT_AMBULATORY_CARE_PROVIDER_SITE_OTHER): Payer: Medicare Other | Admitting: Cardiology

## 2018-08-03 VITALS — BP 168/98 | HR 89 | Ht 70.0 in | Wt 240.0 lb

## 2018-08-03 DIAGNOSIS — I10 Essential (primary) hypertension: Secondary | ICD-10-CM

## 2018-08-03 DIAGNOSIS — I5022 Chronic systolic (congestive) heart failure: Secondary | ICD-10-CM | POA: Diagnosis not present

## 2018-08-03 DIAGNOSIS — I2510 Atherosclerotic heart disease of native coronary artery without angina pectoris: Secondary | ICD-10-CM

## 2018-08-03 DIAGNOSIS — G4730 Sleep apnea, unspecified: Secondary | ICD-10-CM

## 2018-08-03 MED ORDER — SPIRONOLACTONE 25 MG PO TABS: 25.0000 mg | ORAL_TABLET | Freq: Every day | ORAL | 1 refills | Status: DC

## 2018-08-03 NOTE — Progress Notes (Signed)
Virtual Visit via Telephone Note   This visit type was conducted due to national recommendations for restrictions regarding the COVID-19 Pandemic (e.g. social distancing) in an effort to limit this patient's exposure and mitigate transmission in our community.  Due to his co-morbid illnesses, this patient is at least at moderate risk for complications without adequate follow up.  This format is felt to be most appropriate for this patient at this time.  The patient did not have access to video technology/had technical difficulties with video requiring transitioning to audio format only (telephone).  All issues noted in this document were discussed and addressed.  No physical exam could be performed with this format.  Please refer to the patient's chart for his  consent to telehealth for [facility].   Date:  08/03/2018   ID:  [name], DOB 08-02-1965, MRN [id number]  Patient Location: Home Provider Location: Office  PCP:  Samuel Jester, DO  Cardiologist:  [name], MD  Electrophysiologist:  None   Evaluation Performed:  Follow-Up Visit  Chief Complaint:  Follow up  History of Present Illness:    Bartholome Perro. is a 53 y.o. male seen today for follow up of the following medical problems.   1. CAD/ICM/Chronic systolic HF - history of anterior STEMI 02/2008, received DES to LAD. LVEF at that time was 35% - 2012 myoview anterior scar with mild ischemia - 06/2011 echo LVEF 45-50% - 06/2011 GXT no ischemia - 07/2016 echo shows LVEF down to 30-35%, diffuse hypokinesis with akinesis of anerior and anteroseptal walls. Evidence of LV thrombus - with drop in LVEF and ongoing chest pain was referred for cath  08/2016 cath: see report below, received DES to D2 99%. Ok to stop ASA once coumadin is therapeutic per interventional cardiology, continue plavix.  11/2016 echo LVEF 35%, apical thrombus - 02/2017 echo LVEF 35-40%, restrictive, no clear thrombus  Jan 2020 nuclear  stress: large anterior/anteroseptal scar, LVEF 22%.   - no recent chest pain - can have some SOB at times. No recent edema - compliant with meds    2. LV thrombus - noted on11/2018 echo -repeat echo 02/2017 showed resolution of clot, coumadin was stopped.   3. HTN -home bp's typically 160s/100s - compliant with meds   4. OSA - has prior diagnosis. Not wearing cpap due to discomfort. SIgnificant daytime somonlence.  - pcp referred again for sleep study but could not afford.       The patient does not have symptoms concerning for COVID-19 infection (fever, chills, cough, or new shortness of breath).    Past Medical History:  Diagnosis Date  . Arthritis    in lower back  . Coronary artery disease    a. 02/2008: anterior STEMI s/p DES to prox LAD, b. Myoview 2012: scar in the anterior wall with mild ischemia and EF 33%, c. neg GXT 06/2011  d. 08/2016: cath with DES to diagonal   . Depression with anxiety   . Diabetes mellitus    TYPE II  . Fatty liver    H/O ELEVATED LIVER ENZYMES  . Hyperlipidemia   . Hypertension   . Ischemic cardiomyopathy    a. Previous EF 33-35% 2012, b. improved to 45-50% by echo 06/2011  . LV (left ventricular) mural thrombus without MI   . Noncompliance    Prior hx of med noncompliance  . OSA (obstructive sleep apnea)    Past Surgical History:  Procedure Laterality Date  . CORONARY STENT  INTERVENTION N/A 08/21/2016   Procedure: CORONARY STENT INTERVENTION;  Surgeon: Burnell Blanks, MD;  Location: [place] [facility];  Service: Cardiovascular;  Laterality: N/A;  . INTRAVASCULAR PRESSURE WIRE/FFR STUDY N/A 08/21/2016   Procedure: INTRAVASCULAR PRESSURE WIRE/FFR STUDY;  Surgeon: Burnell Blanks, MD;  Location: [place] [facility];  Service: Cardiovascular;  Laterality: N/A;  . LIVER BIOPSY    . MI WITH STENTS    . NASAL SINUS SURGERY    . RIGHT/LEFT HEART CATH AND CORONARY ANGIOGRAPHY N/A 08/21/2016   Procedure: Right/Left  Heart Cath and Coronary Angiography;  Surgeon: Burnell Blanks, MD;  Location: [place] [facility];  Service: Cardiovascular;  Laterality: N/A;     No outpatient medications have been marked as taking for the 08/03/18 encounter (Appointment) with Arnoldo Lenis, MD.     Allergies:   Patient has no known allergies.   Social History   Tobacco Use  . Smoking status: Never Smoker  . Smokeless tobacco: Never Used  Substance Use Topics  . Alcohol use: No  . Drug use: No     Family Hx: The patient's family history includes Cancer in his brother; Coronary artery disease in an other family member; Diabetes Mellitus II in his father and mother; Heart disease in his father.  ROS:   Please see the history of present illness.     All other systems reviewed and are negative.   Prior CV studies:   The following studies were reviewed today:  6./2013 echo Study Conclusions  - Left ventricle: Systolic function was mildly reduced. The estimated ejection fraction was in the range of 45% to 50%. - Left atrium: The atrium was mildly dilated.  08/07/16 echo Study Conclusions  - Left ventricle: LV systolic function is depressed at approximately 30 to 35% with severe hypokinesis/akinesis of the inferior, septal, distal lateral and apical walls. Cannot exclude mural thrombus at apex. Suggest limited echo with Definity to define The cavity size was normal. Wall thickness was normal. - Mitral valve: There was mild regurgitation. - Left atrium: The atrium was mildly dilated.  08/14/16 echo Study Conclusions  - Procedure narrative: Transthoracic echocardiography. Image quality was adequate. The study was technically difficult. Intravenous contrast (Definity) was administered. - Left ventricle: Systolic function was moderately to severely reduced. The estimated ejection fraction was in the range of 30% to 35%. Diffuse hypokinesis. There is akinesis of the  anteroseptal, anterior, and apical myocardium. - Right ventricle: Systolic function was normal.  Impressions:  - Limited study with Definity contrast. LVEF is in the range of 30-35%. There is a loosely organized LV apical thrombus noted. Diffuse hypokinesis overall with akinesis of the anterior, anteroseptal, and apical myocardium. RV contraction normal based on limited views.  08/2016 cath  Mid RCA-2 lesion, 30 %stenosed.  Mid RCA-1 lesion, 30 %stenosed.  Ost Ramus to Ramus lesion, 20 %stenosed.  Ost LAD to Prox LAD lesion, 10 %stenosed.  Prox LAD to Mid LAD lesion, 60 %stenosed.  A STENT SYNERGY DES 2.25X12 drug eluting stent was successfully placed.  2nd Diag lesion, 99 %stenosed.  Post intervention, there is a 0% residual stenosis.  1. Patent stent proximal LAD 2. Moderate stenosis mid LAD. FFR was 0.84 suggesting the stenosis was not flow limiting.  3. The Diagonal [place]ner is a moderate caliber vessel with a 99% stenosis.  4. Successful PTCA/DES x 1 Diagonal [place] 5. Mild disease RCA  Recommendations: Will continue ASA and Plavix for now. He will need to be started  on coumadin tomorrow before discharge for his LV thrombus. He can stop ASA when his INR is therapeutic on coumadin since he was not an ACS. I would plan long term coumadin for LV thrombus and Plavix for at least one year given placement of DES. Continue statin and beta blocker.  02/2017 echo Study Conclusions  - Left ventricle: The cavity size was normal. Wall thickness was increased in a pattern of mild LVH. Systolic function was moderately reduced. The estimated ejection fraction was in the range of 35% to 40%. No definitive thrombus seen with contrast enhancement. Diffuse hypokinesis. Diastolic dysfunction with restrictive physiology. - Regional wall motion abnormality: Akinesis of the mid anterior and basal-mid anteroseptal myocardium. - Right ventricle: Systolic  function was reduced.   Jan 2020 nuclear stress  No diagnostic ST segment changes to indicate ischemia.  Large, dense, anterior and anteroseptal defect extending from apex to base consistent with infarct scar.  This is a high risk study.  Nuclear stress EF: 22%. Consistent with ischemic cardiomyopathy and large anterior/anteroseptal infarct scar, no significant ischemia. Labs/Other Tests and Data Reviewed:    EKG:  No ECG reviewed.  Recent Labs: No results found for requested labs within last 8760 hours.   Recent Lipid Panel Lab Results  Component Value Date/Time   CHOL 216 (H) 08/07/2016 12:40 PM   TRIG 118 08/07/2016 12:40 PM   HDL 34 (L) 08/07/2016 12:40 PM   CHOLHDL 6.4 08/07/2016 12:40 PM   LDLCALC 158 (H) 08/07/2016 12:40 PM    Wt Readings from Last 3 Encounters:  11/12/17 234 lb 6.4 oz (106.3 kg)  09/29/17 237 lb 3.4 oz (107.6 kg)  08/02/17 238 lb 9.6 oz (108.2 kg)     Objective:    Vital Signs:   Today's Vitals   08/03/18 1340  BP: (!) 168/98  Pulse: 89  Weight: 240 lb (108.9 kg)  Height: 5\' 10"  (1.778 m)   Body mass index is 34.44 kg/m.  Normal affect. Normal speech pattern and tone. No audible signs of SOb or wheezing. Comfortable, no apparent distress   ASSESSMENT & PLAN:    1. CAD/ICM/chronic systolic HF -medical therapy limited by orthostatic symptoms previously. Fatigue on higher coreg dosing.  - paying out of pocket for meds, work to limit costs - with ongoing chest pain at last visit we had continued DAPT, continue at this time may stop at next visit  2. HTN - above goal. Start aldactone 25mg  daily in setting of HTN and systolic dysfunction - check BMET/mg in 2 weeks - call with bp's in 2 weeks    COVID-19 Education: The signs and symptoms of COVID-19 were discussed with the patient and how to seek care for testing (follow up with PCP or arrange E-visit).  The importance of social distancing was discussed today.  Time:   Today, I  have spent 15 minutes with the patient with telehealth technology discussing the above problems.     Medication Adjustments/Labs and Tests Ordered: Current medicines are reviewed at length with the patient today.  Concerns regarding medicines are outlined above.   Tests Ordered: No orders of the defined types were placed in this encounter.   Medication Changes: No orders of the defined types were placed in this encounter.   Follow Up:  Virtual Visit in 4 month(s)  Signed, Dina RichBranch, Jasiya Markie, MD  08/03/2018 8:48 AM    [facility]

## 2018-08-03 NOTE — Addendum Note (Signed)
Addended by: Julian Hy T on: 08/03/2018 04:41 PM   Modules accepted: Orders

## 2018-08-03 NOTE — Patient Instructions (Signed)
Your physician wants you to follow-up in: [place] will receive a reminder letter in the mail two months in advance. If you don't receive a letter, please call our office to schedule the follow-up appointment.  Your physician has recommended you make the following change in your medication:   START ALDACTONE 25 [place] physician recommends that you return for lab work in: [place] physician has requested that you regularly monitor and record your blood pressure readings at home FOR 2 WEEKS. Please use the same machine at the same time of day to check your readings AND CALL us WITH READINGS   You have been referred to DR [facility]  Thank you for choosing [facility]!!

## 2018-08-29 ENCOUNTER — Other Ambulatory Visit (HOSPITAL_BASED_OUTPATIENT_CLINIC_OR_DEPARTMENT_OTHER): Payer: Self-pay

## 2018-08-29 DIAGNOSIS — R0683 Snoring: Secondary | ICD-10-CM

## 2018-08-29 DIAGNOSIS — R5383 Other fatigue: Secondary | ICD-10-CM

## 2018-08-29 DIAGNOSIS — G4710 Hypersomnia, unspecified: Secondary | ICD-10-CM

## 2018-08-29 DIAGNOSIS — G4730 Sleep apnea, unspecified: Secondary | ICD-10-CM

## 2018-09-05 ENCOUNTER — Other Ambulatory Visit: Payer: Self-pay

## 2018-09-05 ENCOUNTER — Other Ambulatory Visit (HOSPITAL_COMMUNITY)
Admission: RE | Admit: 2018-09-05 | Discharge: 2018-09-05 | Disposition: A | Discharge status: Home / Self Care | Payer: Medicare Other | Source: Ambulatory Visit | Attending: Neurology | Admitting: Neurology

## 2018-09-05 DIAGNOSIS — Z20828 Contact with and (suspected) exposure to other viral communicable diseases: Secondary | ICD-10-CM | POA: Diagnosis not present

## 2018-09-05 DIAGNOSIS — Z01812 Encounter for preprocedural laboratory examination: Secondary | ICD-10-CM | POA: Insufficient documentation

## 2018-09-05 LAB — SARS CORONAVIRUS 2 (TAT 6-24 HRS): SARS Coronavirus 2: NEGATIVE

## 2018-09-07 ENCOUNTER — Other Ambulatory Visit: Payer: Self-pay

## 2018-09-07 ENCOUNTER — Ambulatory Visit: Payer: Medicare Other | Attending: Pulmonary Disease | Admitting: Neurology

## 2018-09-07 DIAGNOSIS — R0683 Snoring: Secondary | ICD-10-CM | POA: Diagnosis not present

## 2018-09-07 DIAGNOSIS — G4710 Hypersomnia, unspecified: Secondary | ICD-10-CM

## 2018-09-07 DIAGNOSIS — G4730 Sleep apnea, unspecified: Secondary | ICD-10-CM

## 2018-09-07 DIAGNOSIS — R5383 Other fatigue: Secondary | ICD-10-CM

## 2018-09-18 NOTE — Procedures (Signed)
[employer] A. Merlene Laughter, MD     [email or web address]             NOCTURNAL POLYSOMNOGRAPHY   LOCATION: [facility]  Patient Name: [name] [name], [name] [name] Date: 09/07/2018 Gender: Male D.O.B: January 13, 1966 Age (years): 52 Referring Provider: Sinda Du Height (inches): 70 Interpreting Physician: Phillips Odor MD, ABSM Weight (lbs): 234 RPSGT: Peak, [place] BMI: 34 MRN: [id number] Neck Size: 18.00 CLINICAL INFORMATION Sleep Study Type: NPSG     Indication for sleep study: Excessive Daytime Sleepiness, Fatigue, Snoring     Epworth Sleepiness Score: 21     SLEEP STUDY TECHNIQUE As per the AASM Manual for the Scoring of Sleep and Associated Events v2.3 (April 2016) with a hypopnea requiring 4% desaturations.  The channels recorded and monitored were frontal, central and occipital EEG, electrooculogram (EOG), submentalis EMG (chin), nasal and oral airflow, thoracic and abdominal wall motion, anterior tibialis EMG, snore microphone, electrocardiogram, and pulse oximetry.  MEDICATIONS Medications self-administered by patient taken the night of the study : N/A  Current Outpatient Medications:  .  aspirin EC 81 MG tablet, Take 81 mg by mouth daily., Disp: , Rfl:  .  atorvastatin (LIPITOR) 80 MG tablet, TAKE 1 TABLET BY MOUTH ONCE DAILY, Disp: 90 tablet, Rfl: 0 .  carvedilol (COREG) 12.5 MG tablet, Take 1.5 tablets (18.75 mg total) by mouth 2 (two) times daily., Disp: 270 tablet, Rfl: 1 .  clopidogrel (PLAVIX) 75 MG tablet, TAKE 1 TABLET BY MOUTH ONCE DAILY WITH BREAKFAST, Disp: 90 tablet, Rfl: 3 .  Empagliflozin (JARDIANCE PO), Take by mouth., Disp: , Rfl:  .  Ertugliflozin L-PyroglutamicAc (STEGLATRO) 5 MG TABS, Take 5 mg by mouth., Disp: , Rfl:  .  lisinopril (PRINIVIL,ZESTRIL) 40 MG tablet, Take 40 mg by mouth daily., Disp: , Rfl:  .  lisinopril (PRINIVIL,ZESTRIL) 40 MG tablet, TAKE 1 TABLET BY MOUTH ONCE DAILY, Disp: 90 tablet, Rfl: 2 .  metFORMIN  (GLUCOPHAGE) 1000 MG tablet, Take 1,000 mg by mouth 2 (two) times daily with a meal., Disp: , Rfl:  .  nitroGLYCERIN (NITROSTAT) 0.4 MG SL tablet, Place 0.4 mg under the tongue every 5 (five) minutes as needed for chest pain., Disp: , Rfl:  .  sildenafil (REVATIO) 20 MG tablet, Take 20 mg by mouth daily as needed. , Disp: , Rfl:  .  spironolactone (ALDACTONE) 25 MG tablet, Take 1 tablet (25 mg total) by mouth daily., Disp: 90 tablet, Rfl: 1 .  topiramate (TOPAMAX) 25 MG capsule, Take 25 mg by mouth 2 (two) times daily., Disp: , Rfl:      SLEEP ARCHITECTURE The study was initiated at 9:30:37 PM and ended at 4:51:43 AM.  Sleep onset time was 8.1 minutes and the sleep efficiency was 76.5%%. The total sleep time was 337.5 minutes.  Stage REM latency was 65.5 minutes.  The patient spent 6.2%% of the night in stage N1 sleep, 66.7%% in stage N2 sleep, 5.9%% in stage N3 and 21.2% in REM.  Alpha intrusion was absent.  Supine sleep was 0.00%.  RESPIRATORY PARAMETERS The overall apnea/hypopnea index (AHI) was 15.5 per hour. There were 20 total apneas, including 0 obstructive, 20 central and 0 mixed apneas. There were 67 hypopneas and 9 RERAs.  The AHI during Stage REM sleep was 9.2 per hour.  AHI while supine was N/A per hour.  The mean oxygen saturation was 92.3%. The minimum SpO2 during sleep was 83.0%.  moderate snoring was noted during this study.  CARDIAC DATA The 2 lead EKG  demonstrated sinus rhythm. The mean heart rate was 65.9 beats per minute. Other EKG findings include: PVCs.  LEG MOVEMENT DATA Mild periodic limb movements are noted.   IMPRESSIONS 1. Moderate obstructive sleep apnea is documented. AutoPap 8-14 is suggested.  2. Mild periodic limb movements are noted.   Argie Ramming, MD Diplomate, American Board of Sleep Medicine.  ELECTRONICALLY SIGNED ON:  09/18/2018, 9:47 PM [facility] PH: [phone]   FX: [phone] ACCREDITED BY  THE AMERICAN ACADEMY OF SLEEP MEDICINE

## 2018-10-03 ENCOUNTER — Ambulatory Visit (INDEPENDENT_AMBULATORY_CARE_PROVIDER_SITE_OTHER): Payer: Medicare Other | Admitting: Otolaryngology

## 2018-10-03 DIAGNOSIS — J343 Hypertrophy of nasal turbinates: Secondary | ICD-10-CM

## 2018-10-03 DIAGNOSIS — J310 Chronic rhinitis: Secondary | ICD-10-CM

## 2018-10-03 DIAGNOSIS — J342 Deviated nasal septum: Secondary | ICD-10-CM | POA: Diagnosis not present

## 2018-10-07 ENCOUNTER — Other Ambulatory Visit: Payer: Self-pay | Admitting: Otolaryngology

## 2018-10-31 ENCOUNTER — Other Ambulatory Visit: Payer: Self-pay

## 2018-10-31 ENCOUNTER — Ambulatory Visit (INDEPENDENT_AMBULATORY_CARE_PROVIDER_SITE_OTHER): Payer: Medicare Other | Admitting: Otolaryngology

## 2018-10-31 DIAGNOSIS — J310 Chronic rhinitis: Secondary | ICD-10-CM | POA: Diagnosis not present

## 2018-10-31 DIAGNOSIS — J343 Hypertrophy of nasal turbinates: Secondary | ICD-10-CM

## 2018-11-18 ENCOUNTER — Encounter (HOSPITAL_COMMUNITY): Payer: Self-pay

## 2018-11-18 ENCOUNTER — Emergency Department (HOSPITAL_COMMUNITY)
Admission: EM | Admit: 2018-11-18 | Discharge: 2018-11-18 | Disposition: A | Discharge status: Home / Self Care | Payer: Medicare Other | Attending: Emergency Medicine | Admitting: Emergency Medicine

## 2018-11-18 ENCOUNTER — Other Ambulatory Visit: Payer: Self-pay

## 2018-11-18 DIAGNOSIS — Z79899 Other long term (current) drug therapy: Secondary | ICD-10-CM | POA: Insufficient documentation

## 2018-11-18 DIAGNOSIS — R3915 Urgency of urination: Secondary | ICD-10-CM | POA: Diagnosis not present

## 2018-11-18 DIAGNOSIS — E119 Type 2 diabetes mellitus without complications: Secondary | ICD-10-CM | POA: Insufficient documentation

## 2018-11-18 DIAGNOSIS — Z7982 Long term (current) use of aspirin: Secondary | ICD-10-CM | POA: Insufficient documentation

## 2018-11-18 DIAGNOSIS — R319 Hematuria, unspecified: Secondary | ICD-10-CM | POA: Diagnosis present

## 2018-11-18 DIAGNOSIS — Z7984 Long term (current) use of oral hypoglycemic drugs: Secondary | ICD-10-CM | POA: Diagnosis not present

## 2018-11-18 DIAGNOSIS — R339 Retention of urine, unspecified: Secondary | ICD-10-CM | POA: Diagnosis not present

## 2018-11-18 DIAGNOSIS — I259 Chronic ischemic heart disease, unspecified: Secondary | ICD-10-CM | POA: Insufficient documentation

## 2018-11-18 DIAGNOSIS — Z7902 Long term (current) use of antithrombotics/antiplatelets: Secondary | ICD-10-CM | POA: Insufficient documentation

## 2018-11-18 DIAGNOSIS — I10 Essential (primary) hypertension: Secondary | ICD-10-CM | POA: Insufficient documentation

## 2018-11-18 DIAGNOSIS — R310 Gross hematuria: Secondary | ICD-10-CM | POA: Diagnosis not present

## 2018-11-18 LAB — URINALYSIS, MICROSCOPIC (REFLEX)
RBC / HPF: >50 RBC/hpf (ref 0–5)
Squamous Epithelial / LPF: NONE SEEN (ref 0–5)
WBC, UA: >50 WBC/hpf (ref 0–5)

## 2018-11-18 LAB — URINALYSIS, ROUTINE W REFLEX MICROSCOPIC
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
## 2018-11-18 NOTE — Discharge Instructions (Addendum)
Keep your urology appointment today.  Return to the emergency department if you are unable to urinate at all.

## 2018-11-18 NOTE — ED Notes (Signed)
Attempted to insert foley- resistance met prior to urine return- insertion not successful- Dr Tomi Bamberger aware.

## 2018-11-18 NOTE — ED Triage Notes (Signed)
EMS called out for hematuria. PT told EMS that he recently been treated for UTI, treated with antibiotics. PT reports pressure to lower abdomen and urgency to urinate.  Pt reports having urology   appt this morning.

## 2018-11-20 LAB — URINE CULTURE
Culture: 20000 — AB
Special Requests: NORMAL

## 2018-11-21 ENCOUNTER — Telehealth: Payer: Self-pay | Admitting: Emergency Medicine

## 2018-11-21 NOTE — Telephone Encounter (Signed)
Post ED Visit - Positive Culture Follow-up  Culture report reviewed by antimicrobial stewardship pharmacist: [place] Team []  Elenor Quinones, Pharm.D. [x]  Heide Guile, Pharm.D., BCPS AQ-ID []  Parks Neptune, Pharm.D., BCPS []  Alycia Rossetti, Pharm.D., BCPS []  [place], Pharm.D., BCPS, AAHIVP []  Legrand Como, Pharm.D., BCPS, AAHIVP []  Salome Arnt, PharmD, BCPS []  Johnnette Gourd, PharmD, BCPS []  Hughes Better, PharmD, BCPS []  Leeroy Cha, PharmD []  Laqueta Linden, PharmD, BCPS []  Albertina Parr, PharmD  [place] Team []  Leodis Sias, PharmD []  Lindell Spar, PharmD []  Royetta Asal, PharmD []  Graylin Shiver, Rph []  Rema Fendt) Glennon Mac, PharmD []  Arlyn Dunning, PharmD []  Netta Cedars, PharmD []  Dia Sitter, PharmD []  Leone Haven, PharmD []  Gretta Arab, PharmD []  Theodis Shove, PharmD []  Peggyann Juba, PharmD []  Reuel Boom, PharmD   Positive urine culture Treated with none, asymptomatic,  no further patient follow-up is required at this time.  Hazle Nordmann 11/21/2018, 1:12 PM

## 2018-12-08 ENCOUNTER — Telehealth: Payer: Self-pay | Admitting: Cardiology

## 2018-12-08 NOTE — Telephone Encounter (Signed)

## 2018-12-09 ENCOUNTER — Ambulatory Visit: Payer: Medicare Other | Admitting: Cardiology

## 2019-01-17 NOTE — Pre-Procedure Instructions (Signed)
Your procedure is scheduled on Wednesday, January 6th, from 0830 AM to 10:00 AM.  Report to [facility] Main Entrance "A" at 06:30 A.M., and check in at the Admitting office.  Call this number if you have problems the morning of surgery:  [phone]  Call [phone] if you have any questions prior to your surgery date Monday-Friday 8am-4pm    Remember:  Do not eat or drink after midnight the night before your surgery.    Take these medicines the morning of surgery with A SIP OF WATER : carvedilol (COREG)  IF NEEDED: MYRBETRIQ nitroGLYCERIN (NITROSTAT)  tamsulosin (FLOMAX)  Follow your surgeon's instructions on when to stop Aspirin and clopidogrel (PLAVIX).  If no instructions were given by your surgeon then you will need to call the office to get those instructions.    As of today, STOP taking any Aspirin (unless otherwise instructed by your surgeon), Aleve, Naproxen, Ibuprofen, Motrin, Advil, Goody's, BC's, all herbal medications, fish oil, and all vitamins.   WHAT DO I DO ABOUT MY DIABETES MEDICATION?    Take your usual dose of Semaglutide (RYBELSUS) the day before surgery.   Do  Not take canagliflozin [facility]) the day before surgery.   . Do not take oral diabetes medicines (pills) such as canagliflozin (INVOKANA) or Semaglutide (RYBELSUS) the morning of surgery.   HOW TO MANAGE YOUR DIABETES BEFORE AND AFTER SURGERY  Why is it important to control my blood sugar before and after surgery? . Improving blood sugar levels before and after surgery helps healing and can limit problems. . A way of improving blood sugar control is eating a healthy diet by: o  Eating less sugar and carbohydrates o  Increasing activity/exercise o  Talking with your doctor about reaching your blood sugar goals . High blood sugars (greater than 180 mg/dL) can raise your risk of infections and slow your recovery, so you will need to focus on controlling your diabetes during the weeks before  surgery. . Make sure that the doctor who takes care of your diabetes knows about your planned surgery including the date and location.  How do I manage my blood sugar before surgery? . Check your blood sugar at least 4 times a day, starting 2 days before surgery, to make sure that the level is not too high or low. . Check your blood sugar the morning of your surgery when you wake up and every 2 hours until you get to the Short Stay unit. o If your blood sugar is less than 70 mg/dL, you will need to treat for low blood sugar: - Do not take insulin. - Treat a low blood sugar (less than 70 mg/dL) with  cup of clear juice (cranberry or apple), 4 glucose tablets, OR glucose gel. - Recheck blood sugar in 15 minutes after treatment (to make sure it is greater than 70 mg/dL). If your blood sugar is not greater than 70 mg/dL on recheck, call [phone] for further instructions. . Report your blood sugar to the short stay nurse when you get to Short Stay.  . If you are admitted to the hospital after surgery: o Your blood sugar will be checked by the staff and you will probably be given insulin after surgery (instead of oral diabetes medicines) to make sure you have good blood sugar levels. o The goal for blood sugar control after surgery is 80-180 mg/dL.   The Morning of Surgery  Do not wear jewelry.  Do not wear lotions, powders, colognes, or  deodorant.  Men may shave face and neck.  Do not bring valuables to the hospital.  [facility] is not responsible for any belongings or valuables.  If you are a smoker, DO NOT Smoke 24 hours prior to surgery  If you wear a CPAP at night please bring your mask, tubing, and machine the morning of surgery   Remember that you must have someone to transport you home after your surgery, and remain with you for 24 hours if you are discharged the same day.   Please bring cases for contacts, glasses, hearing aids, dentures or bridgework because it cannot be worn  into surgery.    Leave your suitcase in the car.  After surgery it may be brought to your room.  For patients admitted to the hospital, discharge time will be determined by your treatment team.  Patients discharged the day of surgery will not be allowed to drive home.    Special instructions:   [name] [name]- Preparing For Surgery  Before surgery, you can play an important role. Because skin is not sterile, your skin needs to be as free of germs as possible. You can reduce the number of germs on your skin by washing with CHG (chlorahexidine gluconate) Soap before surgery.  CHG is an antiseptic cleaner which kills germs and bonds with the skin to continue killing germs even after washing.    Oral Hygiene is also important to reduce your risk of infection.  Remember - BRUSH YOUR TEETH THE MORNING OF SURGERY WITH YOUR REGULAR TOOTHPASTE  Please do not use if you have an allergy to CHG or antibacterial soaps. If your skin becomes reddened/irritated stop using the CHG.  Do not shave (including legs and underarms) for at least 48 hours prior to first CHG shower. It is OK to shave your face.  Please follow these instructions carefully.   1. Shower the NIGHT BEFORE SURGERY and the MORNING OF SURGERY with CHG Soap.   2. If you chose to wash your hair, wash your hair first as usual with your normal shampoo.  3. After you shampoo, rinse your hair and body thoroughly to remove the shampoo.  4. Use CHG as you would any other liquid soap. You can apply CHG directly to the skin and wash gently with a scrungie or a clean washcloth.   5. Apply the CHG Soap to your body ONLY FROM THE NECK DOWN.  Do not use on open wounds or open sores. Avoid contact with your eyes, ears, mouth and genitals (private parts). Wash Face and genitals (private parts)  with your normal soap.   6. Wash thoroughly, paying special attention to the area where your surgery will be performed.  7. Thoroughly rinse your body with  warm water from the neck down.  8. DO NOT shower/wash with your normal soap after using and rinsing off the CHG Soap.  9. Pat yourself dry with a CLEAN TOWEL.  10. Wear CLEAN PAJAMAS to bed the night before surgery, wear comfortable clothes the morning of surgery  11. Place CLEAN SHEETS on your bed the night of your first shower and DO NOT SLEEP WITH PETS.    Day of Surgery:   Remember to brush your teeth WITH YOUR REGULAR TOOTHPASTE. Please shower the morning of surgery with the CHG soap Do not apply any deodorants/lotions. Please wear clean clothes to the hospital/surgery center.      Please read over the following fact sheets that you were given.

## 2019-01-18 ENCOUNTER — Other Ambulatory Visit: Payer: Self-pay

## 2019-01-18 ENCOUNTER — Encounter (HOSPITAL_COMMUNITY)
Admission: RE | Admit: 2019-01-18 | Discharge: 2019-01-18 | Disposition: A | Discharge status: Home / Self Care | Payer: Medicare Other | Source: Ambulatory Visit | Attending: Otolaryngology | Admitting: Otolaryngology

## 2019-01-18 ENCOUNTER — Encounter (HOSPITAL_COMMUNITY): Payer: Self-pay

## 2019-01-18 DIAGNOSIS — R9431 Abnormal electrocardiogram [ECG] [EKG]: Secondary | ICD-10-CM | POA: Insufficient documentation

## 2019-01-18 DIAGNOSIS — Z01818 Encounter for other preprocedural examination: Secondary | ICD-10-CM | POA: Diagnosis present

## 2019-01-18 DIAGNOSIS — I10 Essential (primary) hypertension: Secondary | ICD-10-CM | POA: Diagnosis not present

## 2019-01-18 LAB — CBC
HCT: 49.6 % (ref 39.0–52.0)
Hemoglobin: 16.6 g/dL (ref 13.0–17.0)
MCH: 29.7 pg (ref 26.0–34.0)
MCHC: 33.5 g/dL (ref 30.0–36.0)
MCV: 88.7 fL (ref 80.0–100.0)
Platelets: 287 10*3/uL (ref 150–400)
RBC: 5.59 MIL/uL (ref 4.22–5.81)
RDW: 12.1 % (ref 11.5–15.5)
WBC: 7.3 10*3/uL (ref 4.0–10.5)
nRBC: 0 % (ref 0.0–0.2)

## 2019-01-18 LAB — HEMOGLOBIN A1C
Hgb A1c MFr Bld: 7.8 % — ABNORMAL HIGH (ref 4.8–5.6)
Mean Plasma Glucose: 177.16 mg/dL

## 2019-01-18 LAB — BASIC METABOLIC PANEL
Anion gap: 10 (ref 5–15)
BUN: 15 mg/dL (ref 6–20)
CO2: 23 mmol/L (ref 22–32)
Calcium: 9.9 mg/dL (ref 8.9–10.3)
Chloride: 104 mmol/L (ref 98–111)
Creatinine, Ser: 0.88 mg/dL (ref 0.61–1.24)
GFR calc Af Amer: >60 mL/min (ref >60)
GFR calc non Af Amer: >60 mL/min (ref >60)
Glucose, Bld: 159 mg/dL — ABNORMAL HIGH (ref 70–99)
Potassium: 4.1 mmol/L (ref 3.5–5.1)
Sodium: 137 mmol/L (ref 135–145)

## 2019-01-18 LAB — GLUCOSE, CAPILLARY: Glucose-Capillary: 150 mg/dL — ABNORMAL HIGH (ref 70–99)

## 2019-01-18 NOTE — Progress Notes (Signed)
PCP - Dr. Evelina Dun Cardiologist - Dr. Harl Bowie  PPM/ICD - N/A Device Orders - N/A  Rep Notified - N/A  Chest x-ray - N/A EKG - 01/18/19 Stress Test - 02/02/18 ECHO - 03/03/17 Cardiac Cath - 08/21/16  Sleep Study - h/o OSA CPAP - does not use  Fasting Blood Sugar - 140-150 Checks Blood Sugar once daily  Blood Thinner Instructions: spoke with Lattie Haw at Dr. Nelly Laurence office, pt to stop Plavix and ASA 3 days before surgery. Last dose 01/19/19.   ERAS Protcol - N/A PRE-SURGERY Ensure or G2- N/A  COVID TEST- 01/23/19 at [facility]   Anesthesia review: cardiac history  Patient denies shortness of breath, fever, cough and chest pain at PAT appointment   All instructions explained to the patient, with a verbal understanding of the material. Patient agrees to go over the instructions while at home for a better understanding. Patient also instructed to self quarantine after being tested for COVID-19. The opportunity to ask questions was provided.

## 2019-01-19 ENCOUNTER — Telehealth: Payer: Self-pay

## 2019-01-19 ENCOUNTER — Telehealth: Payer: Self-pay | Admitting: Cardiology

## 2019-01-19 DIAGNOSIS — I10 Essential (primary) hypertension: Secondary | ICD-10-CM | POA: Insufficient documentation

## 2019-01-19 DIAGNOSIS — E119 Type 2 diabetes mellitus without complications: Secondary | ICD-10-CM | POA: Insufficient documentation

## 2019-01-19 DIAGNOSIS — Z7952 Long term (current) use of systemic steroids: Secondary | ICD-10-CM | POA: Diagnosis not present

## 2019-01-19 DIAGNOSIS — J343 Hypertrophy of nasal turbinates: Secondary | ICD-10-CM | POA: Diagnosis not present

## 2019-01-19 DIAGNOSIS — J3489 Other specified disorders of nose and nasal sinuses: Secondary | ICD-10-CM | POA: Insufficient documentation

## 2019-01-19 LAB — HEPATIC FUNCTION PANEL
ALT: 31 U/L (ref 0–44)
AST: 22 U/L (ref 15–41)
Albumin: 4.3 g/dL (ref 3.5–5.0)
Alkaline Phosphatase: 54 U/L (ref 38–126)
Bilirubin, Direct: <0.1 mg/dL (ref 0.0–0.2)
Total Bilirubin: 0.6 mg/dL (ref 0.3–1.2)
Total Protein: 7.4 g/dL (ref 6.5–8.1)

## 2019-01-19 NOTE — Anesthesia Preprocedure Evaluation (Addendum)
Anesthesia Evaluation  Patient identified by MRN, date of birth, ID band Patient awake    Reviewed: Allergy & Precautions, NPO status , Patient's Chart, lab work & pertinent test results  Airway Mallampati: III  TM Distance: >3 FB Neck ROM: Full    Dental  (+) Loose,    Pulmonary    breath sounds clear to auscultation       Cardiovascular hypertension,  Rhythm:Regular Rate:Normal     Neuro/Psych    GI/Hepatic   Endo/Other  diabetes  Renal/GU      Musculoskeletal   Abdominal   Peds  Hematology   Anesthesia Other Findings   Reproductive/Obstetrics                            Anesthesia Physical Anesthesia Plan  ASA: III  Anesthesia Plan: General   Post-op Pain Management:    Induction: Intravenous  PONV Risk Score and Plan: Ondansetron and Dexamethasone  Airway Management Planned: Oral ETT  Additional Equipment:   Intra-op Plan:   Post-operative Plan: Extubation in OR  Informed Consent: I have reviewed the patients History and Physical, chart, labs and discussed the procedure including the risks, benefits and alternatives for the proposed anesthesia with the patient or authorized representative who has indicated his/her understanding and acceptance.     Dental advisory given  Plan Discussed with: CRNA and Anesthesiologist  Anesthesia Plan Comments: (Follows with cardiology for hx of CAD/ICM/Chronic systolic HF - history of anterior STEMI 02/2008, received DES to LAD. LVEF at that time was 35%. 08/2016 cath: see report below, received DES to D2 99%.11/2016 echo LVEF 35%, apical thrombus. 02/2017 echo LVEF 35-40%, restrictive, no clear thrombus.Jan 2020 nuclear stress: large anterior/anteroseptal scar, LVEF 22%.   Last seen by Dr. Wyline Mood 08/03/18. Per note no recent chest pain, occasional SOB, no recent edema. Compliant with meds. Hx of OSA but unable to tolerate CPAP]  Pt does  not currently have cardiac clearance. I called Dr. Verna Czech office and spoke with his assistant and she confirmed they have not received clearance request. She is going to follow up with Dr. Avel Sensor office on 01/23/19 for additional detail. From an anesthesia standpoint pt will need cardiac clearance prior to general anesthesia for elective surgery.   Addendum 01/19/19 15:47 - Dr. Wyline Mood reviewed pt's chart and put in telephone encounter stating "Ok to proceed with surgery, considered low risk currently given the stability of his cardiac disease. Can hold aspirin and plavix as needed."  Preop labs reviewed, DMII reasonably well controlled A1c 7.8. Remainder of labs WNL.   EKG 01/18/19: Normal sinus rhythm. Rate 83. Septal infarct , age undetermined  Jan 2020 nuclear stress No diagnostic ST segment changes to indicate ischemia. Large, dense, anterior and anteroseptal defect extending from apex to base consistent with infarct scar. This is a high risk study. Nuclear stress EF: 22%. Consistent with ischemic cardiomyopathy and large anterior/anteroseptal infarct scar, no significant ischemia.  02/2017 echo Study Conclusions - Left ventricle: The cavity size was normal. Wall thickness was increased in a pattern of mild LVH. Systolic function was moderately reduced. The estimated ejection fraction was in the range of 35% to 40%. No definitive thrombus seen with contrast enhancement. Diffuse hypokinesis. Diastolic dysfunction with restrictive physiology. - Regional wall motion abnormality: Akinesis of the mid anterior and basal-mid anteroseptal myocardium. - Right ventricle: Systolic function was reduced.  08/2016 cath Mid RCA-2 lesion, 30 %stenosed. Mid RCA-1 lesion, 30 %stenosed. Ost Ramus  to Ramus lesion, 20 %stenosed. Ost LAD to Prox LAD lesion, 10 %stenosed. Prox LAD to Mid LAD lesion, 60 %stenosed. A STENT SYNERGY DES 2.25X12 drug eluting stent was successfully placed. 2nd  Diag lesion, 99 %stenosed. Post intervention, there is a 0% residual stenosis.  1. Patent stent proximal LAD 2. Moderate stenosis mid LAD. FFR was 0.84 suggesting the stenosis was not flow limiting.  3. The Diagonal branch is a moderate caliber vessel with a 99% stenosis.  4. Successful PTCA/DES x 1 Diagonal branch 5. Mild disease RCA  Recommendations: Will continue ASA and Plavix for now. He will need to be started on coumadin tomorrow before discharge for his LV thrombus. He can stop ASA when his INR is therapeutic on coumadin since he was not an ACS. I would plan long term coumadin for LV thrombus and Plavix for at least one year given placement of DES. Continue statin and beta blocker. )      Anesthesia Quick Evaluation

## 2019-01-19 NOTE — Progress Notes (Addendum)
Anesthesia Chart Review:  Follows with cardiology for hx of CAD/ICM/Chronic systolic HF - history of anterior STEMI 02/2008, received DES to LAD. LVEF at that time was 35%. 08/2016 cath: see report below, received DES to D2 99%.11/2016 echo LVEF 35%, apical thrombus. 02/2017 echo LVEF 35-40%, restrictive, no clear thrombus.Jan 2020 nuclear stress: large anterior/anteroseptal scar, LVEF 22%.   Last seen by Dr. Harl Bowie 08/03/18. Per note no recent chest pain, occasional SOB, no recent edema. Compliant with meds. Hx of OSA but unable to tolerate CPAP]  Pt does not currently have cardiac clearance. I called Dr. Nelly Laurence office and spoke with his assistant and she confirmed they have not received clearance request. She is going to follow up with Dr. Deeann Saint office on 01/23/19 for additional detail. From an anesthesia standpoint pt will need cardiac clearance prior to general anesthesia for elective surgery.   Addendum 01/19/19 15:47 - Dr. Harl Bowie reviewed pt's chart and put in telephone encounter stating "Ok to proceed with surgery, considered low risk currently given the stability of his cardiac disease. Can hold aspirin and plavix as needed."  Preop labs reviewed, DMII reasonably well controlled A1c 7.8. Remainder of labs WNL.   EKG 01/18/19: Normal sinus rhythm. Rate 83. Septal infarct , age undetermined  Jan 2020 nuclear stress  No diagnostic ST segment changes to indicate ischemia.  Large, dense, anterior and anteroseptal defect extending from apex to base consistent with infarct scar.  This is a high risk study.  Nuclear stress EF: 22%. Consistent with ischemic cardiomyopathy and large anterior/anteroseptal infarct scar, no significant ischemia.  02/2017 echo Study Conclusions - Left ventricle: The cavity size was normal. Wall thickness was increased in a pattern of mild LVH. Systolic function was moderately reduced. The estimated ejection fraction was in the range of 35% to 40%. No  definitive thrombus seen with contrast enhancement. Diffuse hypokinesis. Diastolic dysfunction with restrictive physiology. - Regional wall motion abnormality: Akinesis of the mid anterior and basal-mid anteroseptal myocardium. - Right ventricle: Systolic function was reduced.  08/2016 cath  Mid RCA-2 lesion, 30 %stenosed.  Mid RCA-1 lesion, 30 %stenosed.  Ost Ramus to Ramus lesion, 20 %stenosed.  Ost LAD to Prox LAD lesion, 10 %stenosed.  Prox LAD to Mid LAD lesion, 60 %stenosed.  A STENT SYNERGY DES 2.25X12 drug eluting stent was successfully placed.  2nd Diag lesion, 99 %stenosed.  Post intervention, there is a 0% residual stenosis.  1. Patent stent proximal LAD 2. Moderate stenosis mid LAD. FFR was 0.84 suggesting the stenosis was not flow limiting.  3. The Diagonal branch is a moderate caliber vessel with a 99% stenosis.  4. Successful PTCA/DES x 1 Diagonal branch 5. Mild disease RCA  Recommendations: Will continue ASA and Plavix for now. He will need to be started on coumadin tomorrow before discharge for his LV thrombus. He can stop ASA when his INR is therapeutic on coumadin since he was not an ACS. I would plan long term coumadin for LV thrombus and Plavix for at least one year given placement of DES. Continue statin and beta blocker.  Wynonia Musty [facility]/Anesthesiology Phone [phone] 01/19/2019 3:17 PM

## 2019-01-19 NOTE — Telephone Encounter (Signed)
Spoke with Val Eagle. He questioned pt's cardiac risk due to history. Will forward to Dr. Harl Bowie to advise.

## 2019-01-19 NOTE — Telephone Encounter (Signed)
Asked to assess patient's cardiac risk for upcoming surgery  Ok to proceed with surgery, considered low risk currently given the stability of his cardiac disease. Can hold aspirin and plavix as needed.   Carlyle Dolly MD

## 2019-01-23 ENCOUNTER — Other Ambulatory Visit: Payer: Self-pay

## 2019-01-23 ENCOUNTER — Other Ambulatory Visit (HOSPITAL_COMMUNITY)
Admission: RE | Admit: 2019-01-23 | Discharge: 2019-01-23 | Disposition: A | Discharge status: Home / Self Care | Payer: Medicare Other | Source: Ambulatory Visit | Attending: Otolaryngology | Admitting: Otolaryngology

## 2019-01-23 DIAGNOSIS — Z01812 Encounter for preprocedural laboratory examination: Secondary | ICD-10-CM | POA: Insufficient documentation

## 2019-01-23 DIAGNOSIS — Z20822 Contact with and (suspected) exposure to covid-19: Secondary | ICD-10-CM | POA: Insufficient documentation

## 2019-01-23 LAB — SARS CORONAVIRUS 2 (TAT 6-24 HRS): SARS Coronavirus 2: NEGATIVE

## 2019-01-23 NOTE — Telephone Encounter (Signed)
He has multiple cardiac conditions however these are stable and not a contraindication for surgery.   Dominga Ferry MD

## 2019-01-25 ENCOUNTER — Ambulatory Visit (HOSPITAL_COMMUNITY): Payer: Medicare Other | Admitting: Physician Assistant

## 2019-01-25 ENCOUNTER — Ambulatory Visit (HOSPITAL_COMMUNITY)
Admission: RE | Admit: 2019-01-25 | Discharge: 2019-01-25 | Disposition: A | Discharge status: Home / Self Care | Payer: Medicare Other | Source: Ambulatory Visit | Attending: Otolaryngology | Admitting: Otolaryngology

## 2019-01-25 ENCOUNTER — Encounter (HOSPITAL_COMMUNITY): Payer: Self-pay | Admitting: Otolaryngology

## 2019-01-25 ENCOUNTER — Other Ambulatory Visit: Payer: Self-pay

## 2019-01-25 ENCOUNTER — Encounter (HOSPITAL_COMMUNITY)
Admission: RE | Disposition: A | Discharge status: Home / Self Care | Payer: Self-pay | Source: Ambulatory Visit | Attending: Otolaryngology

## 2019-01-25 DIAGNOSIS — J3489 Other specified disorders of nose and nasal sinuses: Secondary | ICD-10-CM | POA: Insufficient documentation

## 2019-01-25 DIAGNOSIS — I10 Essential (primary) hypertension: Secondary | ICD-10-CM | POA: Insufficient documentation

## 2019-01-25 DIAGNOSIS — J343 Hypertrophy of nasal turbinates: Secondary | ICD-10-CM | POA: Insufficient documentation

## 2019-01-25 DIAGNOSIS — Z7952 Long term (current) use of systemic steroids: Secondary | ICD-10-CM | POA: Insufficient documentation

## 2019-01-25 DIAGNOSIS — E119 Type 2 diabetes mellitus without complications: Secondary | ICD-10-CM | POA: Insufficient documentation

## 2019-01-25 HISTORY — PX: NASAL SEPTOPLASTY W/ TURBINOPLASTY: SHX2070

## 2019-01-25 LAB — GLUCOSE, CAPILLARY
Glucose-Capillary: 182 mg/dL — ABNORMAL HIGH (ref 70–99)
Glucose-Capillary: 187 mg/dL — ABNORMAL HIGH (ref 70–99)

## 2019-01-25 SURGERY — SEPTOPLASTY, NOSE, WITH NASAL TURBINATE REDUCTION
Anesthesia: General | Site: Nose

## 2019-01-25 MED ORDER — SUGAMMADEX SODIUM 200 MG/2ML IV SOLN
INTRAVENOUS | Status: DC | PRN
  Administered 2019-01-25: 200 mg via INTRAVENOUS

## 2019-01-25 MED ORDER — ONDANSETRON HCL 4 MG/2ML IJ SOLN: 4.0000 mg | Freq: Once | INTRAMUSCULAR | Status: DC | PRN

## 2019-01-25 MED ORDER — ROCURONIUM BROMIDE 50 MG/5ML IV SOSY
PREFILLED_SYRINGE | INTRAVENOUS | Status: DC | PRN
  Administered 2019-01-25: 50 mg via INTRAVENOUS

## 2019-01-25 MED ORDER — AMOXICILLIN 875 MG PO TABS: 875.0000 mg | ORAL_TABLET | Freq: Two times a day (BID) | ORAL | 0 refills | Status: AC

## 2019-01-25 MED ORDER — OXYMETAZOLINE HCL 0.05 % NA SOLN
NASAL | Status: DC | PRN
  Administered 2019-01-25: 1

## 2019-01-25 MED ORDER — MIDAZOLAM HCL 2 MG/2ML IJ SOLN
INTRAMUSCULAR | Status: AC
  Filled 2019-01-25: qty 2

## 2019-01-25 MED ORDER — OXYCODONE HCL 5 MG PO TABS
ORAL_TABLET | ORAL | Status: AC
  Filled 2019-01-25: qty 1

## 2019-01-25 MED ORDER — FENTANYL CITRATE (PF) 250 MCG/5ML IJ SOLN
INTRAMUSCULAR | Status: DC | PRN
  Administered 2019-01-25: 100 ug via INTRAVENOUS
  Administered 2019-01-25: 50 ug via INTRAVENOUS

## 2019-01-25 MED ORDER — BACITRACIN ZINC 500 UNIT/GM EX OINT
TOPICAL_OINTMENT | CUTANEOUS | Status: AC
  Filled 2019-01-25: qty 28.35

## 2019-01-25 MED ORDER — CEFAZOLIN SODIUM-DEXTROSE 2-3 GM-%(50ML) IV SOLR
INTRAVENOUS | Status: DC | PRN
  Administered 2019-01-25: 2 g via INTRAVENOUS

## 2019-01-25 MED ORDER — BACITRACIN ZINC 500 UNIT/GM EX OINT: TOPICAL_OINTMENT | CUTANEOUS | Status: DC | PRN

## 2019-01-25 MED ORDER — MIDAZOLAM HCL 5 MG/5ML IJ SOLN
INTRAMUSCULAR | Status: DC | PRN
  Administered 2019-01-25: 2 mg via INTRAVENOUS

## 2019-01-25 MED ORDER — 0.9 % SODIUM CHLORIDE (POUR BTL) OPTIME
TOPICAL | Status: DC | PRN
  Administered 2019-01-25: 1000 mL

## 2019-01-25 MED ORDER — OXYMETAZOLINE HCL 0.05 % NA SOLN
NASAL | Status: AC
  Filled 2019-01-25: qty 30

## 2019-01-25 MED ORDER — FENTANYL CITRATE (PF) 250 MCG/5ML IJ SOLN
INTRAMUSCULAR | Status: AC
  Filled 2019-01-25: qty 5

## 2019-01-25 MED ORDER — PROPOFOL 10 MG/ML IV BOLUS
INTRAVENOUS | Status: AC
  Filled 2019-01-25: qty 40

## 2019-01-25 MED ORDER — STERILE WATER FOR IRRIGATION IR SOLN
Status: DC | PRN
  Administered 2019-01-25: 1000 mL

## 2019-01-25 MED ORDER — LIDOCAINE 2% (20 MG/ML) 5 ML SYRINGE
INTRAMUSCULAR | Status: DC | PRN
  Administered 2019-01-25: 60 mg via INTRAVENOUS

## 2019-01-25 MED ORDER — LIDOCAINE-EPINEPHRINE 1 %-1:100000 IJ SOLN
INTRAMUSCULAR | Status: AC
  Filled 2019-01-25: qty 1

## 2019-01-25 MED ORDER — ONDANSETRON HCL 4 MG/2ML IJ SOLN
INTRAMUSCULAR | Status: DC | PRN
  Administered 2019-01-25: 4 mg via INTRAVENOUS

## 2019-01-25 MED ORDER — OXYCODONE HCL 5 MG PO TABS
5.0000 mg | ORAL_TABLET | Freq: Once | ORAL | Status: AC | PRN
  Administered 2019-01-25: 5 mg via ORAL

## 2019-01-25 MED ORDER — PROPOFOL 10 MG/ML IV BOLUS
INTRAVENOUS | Status: DC | PRN
  Administered 2019-01-25: 130 mg via INTRAVENOUS

## 2019-01-25 MED ORDER — LACTATED RINGERS IV SOLN: INTRAVENOUS | Status: DC | PRN

## 2019-01-25 MED ORDER — OXYCODONE HCL 5 MG/5ML PO SOLN: 5.0000 mg | Freq: Once | ORAL | Status: AC | PRN

## 2019-01-25 MED ORDER — FENTANYL CITRATE (PF) 100 MCG/2ML IJ SOLN: 25.0000 ug | INTRAMUSCULAR | Status: DC | PRN

## 2019-01-25 MED ORDER — OXYCODONE-ACETAMINOPHEN 5-325 MG PO TABS: 1.0000 | ORAL_TABLET | ORAL | 0 refills | Status: AC | PRN

## 2019-01-25 SURGICAL SUPPLY — 28 items
CANISTER SUCT 3000ML PPV (MISCELLANEOUS) ×2 IMPLANT
COAGULATOR SUCT SWTCH 10FR 6 (ELECTROSURGICAL) ×2 IMPLANT
COVER SURGICAL LIGHT HANDLE (MISCELLANEOUS) IMPLANT
COVER WAND RF STERILE (DRAPES) ×1 IMPLANT
DRAPE HALF SHEET 40X57 (DRAPES) IMPLANT
ELECT REM PT RETURN 9FT ADLT (ELECTROSURGICAL) ×2
ELECTRODE REM PT RTRN 9FT ADLT (ELECTROSURGICAL) ×1 IMPLANT
GAUZE SPONGE 2X2 8PLY STRL LF (GAUZE/BANDAGES/DRESSINGS) ×1 IMPLANT
GLOVE ECLIPSE 7.5 STRL STRAW (GLOVE) ×2 IMPLANT
GOWN STRL REUS W/ TWL LRG LVL3 (GOWN DISPOSABLE) ×2 IMPLANT
GOWN STRL REUS W/TWL LRG LVL3 (GOWN DISPOSABLE) ×2
KIT BASIN OR (CUSTOM PROCEDURE TRAY) ×2 IMPLANT
KIT TURNOVER KIT B (KITS) ×2 IMPLANT
NDL HYPO 25GX1X1/2 BEV (NEEDLE) ×1 IMPLANT
NEEDLE HYPO 25GX1X1/2 BEV (NEEDLE) ×2 IMPLANT
NS IRRIG 1000ML POUR BTL (IV SOLUTION) ×2 IMPLANT
PAD ARMBOARD 7.5X6 YLW CONV (MISCELLANEOUS) ×4 IMPLANT
SPLINT NASAL DOYLE BI-VL (GAUZE/BANDAGES/DRESSINGS) ×2 IMPLANT
SPONGE GAUZE 2X2 STER 10/PKG (GAUZE/BANDAGES/DRESSINGS) ×1
SPONGE NEURO XRAY DETECT 1X3 (DISPOSABLE) ×2 IMPLANT
SUT CHROMIC 3 0 SH 27 (SUTURE) ×2 IMPLANT
SUT CHROMIC 4 0 SH 27 (SUTURE) ×2 IMPLANT
SUT PLAIN 4 0 ~~LOC~~ 1 (SUTURE) ×2 IMPLANT
SUT PROLENE 2 0 FS (SUTURE) ×2 IMPLANT
SUT PROLENE 3 0 PS 2 (SUTURE) ×2 IMPLANT
TRAY ENT MC OR (CUSTOM PROCEDURE TRAY) ×2 IMPLANT
TUBE SALEM SUMP 16 FR W/ARV (TUBING) ×1 IMPLANT
TUBING EXTENTION W/L.L. (IV SETS) IMPLANT

## 2019-01-25 NOTE — Anesthesia Procedure Notes (Signed)
Procedure Name: Intubation Date/Time: 01/25/2019 8:56 AM Performed by: Glynda Jaeger, CRNA Pre-anesthesia Checklist: Patient identified, Patient being monitored, Timeout performed, Emergency Drugs available and Suction available Patient Re-evaluated:Patient Re-evaluated prior to induction Oxygen Delivery Method: Circle System Utilized Preoxygenation: Pre-oxygenation with 100% oxygen Induction Type: IV induction Ventilation: Mask ventilation without difficulty Laryngoscope Size: Mac and 4 Grade View: Grade I Tube type: Oral Tube size: 7.5 mm Number of attempts: 1 Airway Equipment and Method: Stylet Placement Confirmation: ETT inserted through vocal cords under direct vision,  positive ETCO2 and breath sounds checked- equal and bilateral Secured at: 23 cm Tube secured with: Tape Dental Injury: Teeth and Oropharynx as per pre-operative assessment

## 2019-01-25 NOTE — Anesthesia Postprocedure Evaluation (Signed)
Anesthesia Post Note  Patient: [name] [name].  Procedure(s) Performed: BILATERAL TURBINATE REDUCTION (N/A Nose)     Patient location during evaluation: PACU Anesthesia Type: General Level of consciousness: awake and alert Pain management: pain level controlled Vital Signs Assessment: post-procedure vital signs reviewed and stable Respiratory status: spontaneous breathing, nonlabored ventilation, respiratory function stable and patient connected to nasal cannula oxygen Cardiovascular status: blood pressure returned to baseline and stable Postop Assessment: no apparent nausea or vomiting Anesthetic complications: no    Last Vitals:  Vitals:   01/25/19 0950 01/25/19 1000  BP: (!) 143/99 (!) 145/99  Pulse: 94 91  Resp: (!) 21 17  Temp: (!) 36.2 C (!) 36.2 C  SpO2: 93% 92%    Last Pain:  Vitals:   01/25/19 0950  PainSc: 2                  Kelii Chittum COKER

## 2019-01-25 NOTE — Discharge Instructions (Addendum)
POSTOPERATIVE INSTRUCTIONS FOR PATIENTS HAVING NASAL OR SINUS OPERATIONS ACTIVITY: Restrict activity at home for the first two days, resting as much as possible. Light activity is best. You may usually return to work within a week. You should refrain from nose blowing, strenuous activity, or heavy lifting greater than 20lbs for a total of one week after your operation.  If sneezing cannot be avoided, sneeze with your mouth open. DISCOMFORT: You may experience a dull headache and pressure along with nasal congestion and discharge. These symptoms may be worse during the first week after the operation but may last as long as two to four weeks.  Please take Tylenol or the pain medication that has been prescribed for you. Do not take aspirin or aspirin containing medications since they may cause bleeding.  You may experience symptoms of post nasal drainage, nasal congestion, headaches and fatigue for two or three months after your operation.  BLEEDING: You may have some blood tinged nasal drainage for approximately two weeks after the operation.  The discharge will be worse for the first week.  Please call our office at [phone] or go to the nearest hospital emergency room if you experience any of the following: heavy, bright red blood from your nose or mouth that lasts longer than 15 minutes or coughing up or vomiting bright red blood or blood clots. GENERAL CONSIDERATIONS: 1. A gauze dressing will be placed on your upper lip to absorb any drainage after the operation. You may need to change this several times a day.  If you do not have very much drainage, you may remove the dressing.  Remember that you may gently wipe your nose with a tissue and sniff in, but DO NOT blow your nose. 2. Please keep all of your postoperative appointments.  Your final results after the operation will depend on proper follow-up.  The initial visit is usually 2 to 5 days after the operation.  During this visit, the remaining nasal  packing and internal septal splints will be removed.  Your nasal and sinus cavities will be cleaned.  During the second visit, your nasal and sinus cavities will be cleaned again. Have someone drive you to your first two postoperative appointments.  3. How you care for your nose after the operation will influence the results that you obtain.  You should follow all directions, take your medication as prescribed, and call our office [phone] with any problems or questions. 4. You may be more comfortable sleeping with your head elevated on two pillows. 5. Do not take any medications that we have not prescribed or recommended. WARNING SIGNS: if any of the following should occur, please call our office: 1. Persistent fever greater than 102F. 2. Persistent vomiting. 3. Severe and constant pain that is not relieved by prescribed pain medication. 4. Trauma to the nose. 5. Rash or unusual side effects from any medicines.  ------------   The patient may resume his plavix and aspirin next Monday 01/29/18.   ----------------

## 2019-01-25 NOTE — Transfer of Care (Signed)
Immediate Anesthesia Transfer of Care Note  Patient: [name] [name].  Procedure(s) Performed: BILATERAL TURBINATE REDUCTION (N/A Nose)  Patient Location: PACU  Anesthesia Type:General  Level of Consciousness: awake, alert , oriented, patient cooperative and responds to stimulation  Airway & Oxygen Therapy: Patient Spontanous Breathing and Patient connected to face mask oxygen  Post-op Assessment: Report given to RN, Post -op Vital signs reviewed and stable and Patient moving all extremities X 4  Post vital signs: Reviewed and stable  Last Vitals:  Vitals Value Taken Time  BP    Temp    Pulse    Resp    SpO2      Last Pain:  Vitals:   01/25/19 0731  PainSc: 0-No pain         Complications: No apparent anesthesia complications

## 2019-01-25 NOTE — H&P (Signed)
Cc: Chronic nasal obstruction  HPI:  The patient is a 54 y/o male who returns today for follow up evaluation of chronic nasal congestion. The patient was last seen 4 weeks ago. At that time, he was noted to have nasal mucosal congestion with bilateral inferior turbinate hypertrophy. The patient was placed on daily steroid nasal spray. He returns today noting minimal relief with the nasal spray. He continues to have significant nighttime congestion. The patient denies a history of recurrent sinus infections.   Exam: The flexible scope was inserted into the right nasal cavity.  Endoscopy of the inferior and middle meatus was performed.  Edematous mucosa was noted.  No polyp, mass, or lesion was appreciated.  Olfactory cleft was clear.  Nasopharynx was clear.  Turbinates were hypertrophied but without mass.  Incomplete response to decongestion.  The procedure was repeated on the contralateral side with similar findings.  The patient tolerated the procedure well.   Assessment: 1. Persistent nasal mucosal congestion is noted with bilateral inferior turbinate hypertrophy. Septum is midline.   Plan:  1. Nasal endoscopy findings are reviewed with the patient.  2. Treatment options include continued use of daily Flonase versus bilateral inferior turbinate reduction. The risks, benefits, details, and alternatives of the procedure are discussed with the patient. Questions are invited and answered. 3. The patient would like to proceed with surgical intervention. The procedure will be scheduled in accordance with the patient's schedule.

## 2019-01-25 NOTE — Op Note (Signed)
DATE OF PROCEDURE: 01/25/2019  OPERATIVE REPORT   SURGEON: Newman Pies, MD   PREOPERATIVE DIAGNOSES:  1. Chronic nasal obstruction.  2. Bilateral inferior turbinate hypertrophy.   POSTOPERATIVE DIAGNOSES:  1. Chronic nasal obstruction.  2. Bilateral inferior turbinate hypertrophy.   PROCEDURE PERFORMED: Bilateral partial inferior turbinate resection.   ANESTHESIA: General endotracheal tube anesthesia.   COMPLICATIONS: None.   ESTIMATED BLOOD LOSS: Minimal.   INDICATION FOR PROCEDURE :[name] [name]. is a 54 y.o. male with a history of chronic nasal obstruction. The patient was treated with antihistamine, decongestant, and steroid nasal spray. However, the patient continued to be symptomatic. On examination, the patient was noted to have bilateral severe inferior turbinate hypertrophy, causing significant nasal obstruction. Based on the above findings, the decision was made for the patient to undergo the above-stated procedure. The risks, benefits, alternatives, and details of the procedure were discussed with the patient. Questions were invited and answered. Informed consent was obtained.   DESCRIPTION: The patient was taken to the operating room and placed supine on the operating table. General endotracheal tube anesthesia was administered by the anesthesiologist. The patient was positioned and prepped and draped in a standard fashion for nasal surgery. Pledgets soaked with Afrin were placed in both nasal cavities. The pledgets were subsequently removed. Examination of the nasal cavities revealed bilateral severe inferior turbinate hypertrophy. The inferior one-half of each inferior turbinate was then crossclamped with a straight Kelly clamp. The inferior one-half of each inferior turbinate was then resected with a pair of cross cutting scissors. Hemostasis was achieved with suction electrocautery, under direct visual guidance of the zero-degree endoscope. Good hemostasis was achieved. The care  of the patient was turned over to the anesthesiologist. The patient was awakened from anesthesia without difficulty. The patient was extubated and transferred to the recovery room in good condition.   OPERATIVE FINDINGS: Bilateral inferior turbinate hypertrophy.   SPECIMEN: None.   FOLLOWUP CARE: The patient will be discharged home once he is awake and alert. The patient will be placed on percocet p.r.n. pain, and amoxicillin 875 mg p.o. b.i.d. for 3 days. The patient will follow up in my office in 1 week.  Newman Pies, MD

## 2019-01-26 NOTE — Addendum Note (Signed)
Addendum  created 01/26/19 0809 by Adair Laundry, CRNA   Order list changed

## 2019-01-29 ENCOUNTER — Other Ambulatory Visit: Payer: Self-pay | Admitting: Physician Assistant

## 2019-02-09 ENCOUNTER — Ambulatory Visit (INDEPENDENT_AMBULATORY_CARE_PROVIDER_SITE_OTHER): Payer: Medicare Other | Admitting: Cardiology

## 2019-02-09 ENCOUNTER — Encounter: Payer: Self-pay | Admitting: Cardiology

## 2019-02-09 ENCOUNTER — Other Ambulatory Visit: Payer: Self-pay

## 2019-02-09 VITALS — BP 134/90 | HR 88 | Ht 70.0 in | Wt 236.0 lb

## 2019-02-09 DIAGNOSIS — I25118 Atherosclerotic heart disease of native coronary artery with other forms of angina pectoris: Secondary | ICD-10-CM | POA: Diagnosis not present

## 2019-02-09 DIAGNOSIS — I10 Essential (primary) hypertension: Secondary | ICD-10-CM | POA: Diagnosis not present

## 2019-02-09 DIAGNOSIS — I5022 Chronic systolic (congestive) heart failure: Secondary | ICD-10-CM | POA: Diagnosis not present

## 2019-02-09 MED ORDER — ISOSORBIDE MONONITRATE ER 30 MG PO TB24: 15.0000 mg | ORAL_TABLET | Freq: Every day | ORAL | 1 refills | Status: DC

## 2019-02-09 MED ORDER — CARVEDILOL 12.5 MG PO TABS: 18.7500 mg | ORAL_TABLET | Freq: Two times a day (BID) | ORAL | 1 refills | Status: DC

## 2019-02-09 MED ORDER — LISINOPRIL 10 MG PO TABS: 10.0000 mg | ORAL_TABLET | Freq: Every day | ORAL | 1 refills | Status: DC

## 2019-02-09 MED ORDER — SIMVASTATIN 40 MG PO TABS: 40.0000 mg | ORAL_TABLET | Freq: Every day | ORAL | 1 refills | Status: DC

## 2019-02-09 NOTE — Progress Notes (Signed)
Clinical Summary [name] [name] is a 54 y.o.male seen today for follow up of the following medical problems.   1. CAD/ICM/Chronic systolic HF - history of anterior STEMI 02/2008, received DES to LAD. LVEF at that time was 35% - 2012 myoview anterior scar with mild ischemia - 06/2011 echo LVEF 45-50% - 06/2011 GXT no ischemia - 07/2016 echo shows LVEF down to 30-35%, diffuse hypokinesis with akinesis of anerior and anteroseptal walls. Evidence of LV thrombus - with drop in LVEF and ongoing chest pain was referred for cath  08/2016 cath: see report below, received DES to D2 99%. Ok to stop ASA once coumadin is therapeutic per interventional cardiology, continue plavix.  11/2016 echo LVEF 35%, apical thrombus - 02/2017 echo LVEF 35-40%, restrictive, no clear thrombus  Jan 2020 nuclear stress: large anterior/anteroseptal scar, LVEF 22%.      - some recent chest pain. Tightness left sided. Typically ocurrs with exertion, often upstairs. 1/10 in severity, some SOB. On and off for a few months. - ran out of meds  2. History of prior LV thrombus - noted on11/2018 echo -repeat echo 02/2017 showed resolution of clot, coumadin was stopped.   3. HTN -home bp's typically 160s/100s - compliant with meds  - last visit started aldactone 25mg  daily    4. OSA - has prior diagnosis. Not wearing cpap due to discomfort. SIgnificant daytime somonlence. - pcp referred again for sleep study but could not afford.  Past Medical History:  Diagnosis Date  . Arthritis    in lower back  . Coronary artery disease    a. 02/2008: anterior STEMI s/p DES to prox LAD, b. Myoview 2012: scar in the anterior wall with mild ischemia and EF 33%, c. neg GXT 06/2011  d. 08/2016: cath with DES to diagonal   . Depression with anxiety   . Diabetes mellitus    TYPE II  . Fatty liver    H/O ELEVATED LIVER ENZYMES  . Hyperlipidemia   . Hypertension   . Ischemic cardiomyopathy    a. Previous EF 33-35%  2012, b. improved to 45-50% by echo 06/2011  . LV (left ventricular) mural thrombus without MI (HCC)   . Noncompliance    Prior hx of med noncompliance  . OSA (obstructive sleep apnea)      No Known Allergies   Current Outpatient Medications  Medication Sig Dispense Refill  . canagliflozin (INVOKANA) 300 MG TABS tablet Take 300 mg by mouth daily before breakfast.    . carvedilol (COREG) 12.5 MG tablet Take 18.75 mg by mouth 2 (two) times daily with a meal.    . lisinopril (PRINIVIL,ZESTRIL) 40 MG tablet TAKE 1 TABLET BY MOUTH ONCE DAILY (Patient taking differently: Take 40 mg by mouth daily. ) 90 tablet 2  . MYRBETRIQ 25 MG TB24 tablet Take 25 mg by mouth daily as needed. Bladder spasms    . nitroGLYCERIN (NITROSTAT) 0.4 MG SL tablet Place 0.4 mg under the tongue every 5 (five) minutes as needed for chest pain.    . Semaglutide (RYBELSUS) 3 MG TABS Take 3 mg by mouth daily.    . sildenafil (REVATIO) 20 MG tablet Take 20 mg by mouth daily as needed.     . simvastatin (ZOCOR) 40 MG tablet Take 40 mg by mouth at bedtime.    . tamsulosin (FLOMAX) 0.4 MG CAPS capsule Take 0.8 mg by mouth daily as needed. Bladder spasms     No current facility-administered medications for this visit.  Past Surgical History:  Procedure Laterality Date  . CARDIAC CATHETERIZATION    . CORONARY STENT INTERVENTION N/A 08/21/2016   Procedure: CORONARY STENT INTERVENTION;  Surgeon: Burnell Blanks, MD;  Location: [facility];  Service: Cardiovascular;  Laterality: N/A;  . INTRAVASCULAR PRESSURE WIRE/FFR STUDY N/A 08/21/2016   Procedure: INTRAVASCULAR PRESSURE WIRE/FFR STUDY;  Surgeon: Burnell Blanks, MD;  Location: [facility];  Service: Cardiovascular;  Laterality: N/A;  . LIVER BIOPSY    . MI WITH STENTS    . NASAL SEPTOPLASTY W/ TURBINOPLASTY N/A 01/25/2019   Procedure: BILATERAL TURBINATE REDUCTION;  Surgeon: Leta Baptist, MD;  Location: [facility];  Service: ENT;  Laterality: N/A;  .  NASAL SINUS SURGERY    . RIGHT/LEFT HEART CATH AND CORONARY ANGIOGRAPHY N/A 08/21/2016   Procedure: Right/Left Heart Cath and Coronary Angiography;  Surgeon: Burnell Blanks, MD;  Location: [facility];  Service: Cardiovascular;  Laterality: N/A;     No Known Allergies    Family History  Problem Relation Age of Onset  . Coronary artery disease Other        UNKNOWN  . Heart disease Father        CAD s/p CABG  . Diabetes Mellitus II Father   . Diabetes Mellitus II Mother   . Cancer Brother      Social History [name] [name] reports that he has never smoked. He has never used smokeless tobacco. [name] [name] reports no history of alcohol use.   Review of Systems CONSTITUTIONAL: No weight loss, fever, chills, weakness or fatigue.  HEENT: Eyes: No visual loss, blurred vision, double vision or yellow sclerae.No hearing loss, sneezing, congestion, runny nose or sore throat.  SKIN: No rash or itching.  CARDIOVASCULAR: per hpi RESPIRATORY: No shortness of breath, cough or sputum.  GASTROINTESTINAL: No anorexia, nausea, vomiting or diarrhea. No abdominal pain or blood.  GENITOURINARY: No burning on urination, no polyuria NEUROLOGICAL: No headache, dizziness, syncope, paralysis, ataxia, numbness or tingling in the extremities. No change in bowel or bladder control.  MUSCULOSKELETAL: No muscle, back pain, joint pain or stiffness.  LYMPHATICS: No enlarged nodes. No history of splenectomy.  PSYCHIATRIC: No history of depression or anxiety.  ENDOCRINOLOGIC: No reports of sweating, cold or heat intolerance. No polyuria or polydipsia.  Marland Kitchen   Physical Examination Today's Vitals   02/09/19 1400  BP: 134/90  Pulse: 88  SpO2: 97%  Weight: 236 lb (107 kg)  Height: 5\' 10"  (1.778 m)   Body mass index is 33.86 kg/m.  Gen: resting comfortably, no acute distress HEENT: no scleral icterus, pupils equal round and reactive, no palptable cervical adenopathy,  CV: RRR, no mrg, no jvd Resp:  Clear to auscultation bilaterally GI: abdomen is soft, non-tender, non-distended, normal bowel sounds, no hepatosplenomegaly MSK: extremities are warm, no edema.  Skin: warm, no rash Neuro:  no focal deficits Psych: appropriate affect   Diagnostic Studies 6./2013 echo Study Conclusions  - Left ventricle: Systolic function was mildly reduced. The estimated ejection fraction was in the range of 45% to 50%. - Left atrium: The atrium was mildly dilated.  08/07/16 echo Study Conclusions  - Left ventricle: LV systolic function is depressed at approximately 30 to 35% with severe hypokinesis/akinesis of the inferior, septal, distal lateral and apical walls. Cannot exclude mural thrombus at apex. Suggest limited echo with Definity to define The cavity size was normal. Wall thickness was normal. - Mitral valve: There was mild regurgitation. - Left atrium: The atrium  was mildly dilated.  08/14/16 echo Study Conclusions  - Procedure narrative: Transthoracic echocardiography. Image quality was adequate. The study was technically difficult. Intravenous contrast (Definity) was administered. - Left ventricle: Systolic function was moderately to severely reduced. The estimated ejection fraction was in the range of 30% to 35%. Diffuse hypokinesis. There is akinesis of the anteroseptal, anterior, and apical myocardium. - Right ventricle: Systolic function was normal.  Impressions:  - Limited study with Definity contrast. LVEF is in the range of 30-35%. There is a loosely organized LV apical thrombus noted. Diffuse hypokinesis overall with akinesis of the anterior, anteroseptal, and apical myocardium. RV contraction normal based on limited views.  08/2016 cath  Mid RCA-2 lesion, 30 %stenosed.  Mid RCA-1 lesion, 30 %stenosed.  Ost Ramus to Ramus lesion, 20 %stenosed.  Ost LAD to Prox LAD lesion, 10 %stenosed.  Prox LAD to Mid LAD lesion, 60  %stenosed.  A STENT SYNERGY DES 2.25X12 drug eluting stent was successfully placed.  2nd Diag lesion, 99 %stenosed.  Post intervention, there is a 0% residual stenosis.  1. Patent stent proximal LAD 2. Moderate stenosis mid LAD. FFR was 0.84 suggesting the stenosis was not flow limiting.  3. The Diagonal [name] [name] is a moderate caliber vessel with a 99% stenosis.  4. Successful PTCA/DES x 1 Diagonal [name] [name] 5. Mild disease RCA  Recommendations: Will continue ASA and Plavix for now. He will need to be started on coumadin tomorrow before discharge for his LV thrombus. He can stop ASA when his INR is therapeutic on coumadin since he was not an ACS. I would plan long term coumadin for LV thrombus and Plavix for at least one year given placement of DES. Continue statin and beta blocker.  02/2017 echo Study Conclusions  - Left ventricle: The cavity size was normal. Wall thickness was increased in a pattern of mild LVH. Systolic function was moderately reduced. The estimated ejection fraction was in the range of 35% to 40%. No definitive thrombus seen with contrast enhancement. Diffuse hypokinesis. Diastolic dysfunction with restrictive physiology. - Regional wall motion abnormality: Akinesis of the mid anterior and basal-mid anteroseptal myocardium. - Right ventricle: Systolic function was reduced.   Jan 2020 nuclear stress  No diagnostic ST segment changes to indicate ischemia.  Large, dense, anterior and anteroseptal defect extending from apex to base consistent with infarct scar.  This is a high risk study.  Nuclear stress EF: 22%. Consistent with ischemic cardiomyopathy and large anterior/anteroseptal infarct scar, no significant ischemia.    Assessment and Plan   1. CAD/ICM/chronic systolic HF -medical therapy limited by orthostatic symptoms previously. Fatigue on higher coreg dosing.  - paying out of pocket for meds, work to limit costs  - can stop  plavix, over a year since last intervention - start imdur 15mg  daily for mild stable exertional chest pains.   2. HTN -at goal, since starting imdur and has had prior orthostatic symptoms decrease lisinopril to 10mg  daily.     , M.D.

## 2019-02-09 NOTE — Patient Instructions (Signed)
Your physician recommends that you schedule a follow-up appointment in: 4  MONTHS WITH DR [facility]   Your physician has recommended you make the following change in your medication:   STOP PLAVIX  START ASPIRIN 81 MG DAILY   DECREASE LISINOPRIL 10 MG DAILY   START IMDUR 15 MG DAILY (1/2 TABLET)  Thank you for choosing [facility]!!

## 2019-04-24 ENCOUNTER — Encounter: Payer: Self-pay | Admitting: Urology

## 2019-04-24 ENCOUNTER — Other Ambulatory Visit: Payer: Self-pay

## 2019-04-24 ENCOUNTER — Ambulatory Visit (INDEPENDENT_AMBULATORY_CARE_PROVIDER_SITE_OTHER): Payer: Medicare Other | Admitting: Urology

## 2019-04-24 VITALS — BP 147/97 | HR 96 | Temp 97.5°F | Ht 70.0 in | Wt 235.0 lb

## 2019-04-24 DIAGNOSIS — R351 Nocturia: Secondary | ICD-10-CM | POA: Diagnosis not present

## 2019-04-24 DIAGNOSIS — N138 Other obstructive and reflux uropathy: Secondary | ICD-10-CM

## 2019-04-24 DIAGNOSIS — N401 Enlarged prostate with lower urinary tract symptoms: Secondary | ICD-10-CM | POA: Diagnosis not present

## 2019-04-24 LAB — POCT URINALYSIS DIPSTICK
Bilirubin, UA: NEGATIVE
Glucose, UA: POSITIVE — AB
Leukocytes, UA: NEGATIVE
Nitrite, UA: NEGATIVE
Protein, UA: POSITIVE — AB
Spec Grav, UA: 1.01 (ref 1.010–1.025)
Urobilinogen, UA: 0.2 E.U./dL
pH, UA: 6 (ref 5.0–8.0)

## 2019-04-24 LAB — BLADDER SCAN AMB NON-IMAGING: Scan Result: 411.6

## 2019-04-24 NOTE — Progress Notes (Signed)
04/24/2019 1:44 PM   [name] [name]. 1965-02-11 [id number]  Referring provider: Rebekah Chesterfield, NP [address] [place],  [place] [address]  Nocturia  HPI: [name] [name] is a 54yo with a hx of DMII who presents for evaluation of Nocturia. He was seen in Nov 2020 by Dr. Arita Miss at [facility] for hemorrhagic cystitis and urinary retention. He had a foley placed at that time. He was started on flomax which failed to improve his LUTS. He was having urinary frequency and was started on mirabegron 25mg  daily. PVR today is 411cc. He has nocturia 4-5x and noctural enuresis. He is on jardiance for DMII.  He has ED and has failed sildenafil. He has issues getting an erection   PMH: Past Medical History:  Diagnosis Date  . Anxiety   . Arthritis    in lower back  . Coronary artery disease    a. 02/2008: anterior STEMI s/p DES to prox LAD, b. Myoview 2012: scar in the anterior wall with mild ischemia and EF 33%, c. neg GXT 06/2011  d. 08/2016: cath with DES to diagonal   . Depression with anxiety   . Diabetes mellitus    TYPE II  . Fatty liver    H/O ELEVATED LIVER ENZYMES  . High cholesterol   . Hyperlipidemia   . Hypertension   . Ischemic cardiomyopathy    a. Previous EF 33-35% 2012, b. improved to 45-50% by echo 06/2011  . LV (left ventricular) mural thrombus without MI (HCC)   . Noncompliance    Prior hx of med noncompliance  . OSA (obstructive sleep apnea)   . Sleep apnea     Surgical History: Past Surgical History:  Procedure Laterality Date  . CARDIAC CATHETERIZATION    . CORONARY STENT INTERVENTION N/A 08/21/2016   Procedure: CORONARY STENT INTERVENTION;  Surgeon: 10/21/2016, MD;  Location: [facility] INVASIVE CV LAB;  Service: Cardiovascular;  Laterality: N/A;  . INTRAVASCULAR PRESSURE WIRE/FFR STUDY N/A 08/21/2016   Procedure: INTRAVASCULAR PRESSURE WIRE/FFR STUDY;  Surgeon: 10/21/2016, MD;  Location: [facility] INVASIVE CV LAB;  Service: Cardiovascular;  Laterality: N/A;  .  LIVER BIOPSY    . MI WITH STENTS    . NASAL SEPTOPLASTY W/ TURBINOPLASTY N/A 01/25/2019   Procedure: BILATERAL TURBINATE REDUCTION;  Surgeon: 03/25/2019, MD;  Location: [facility] OR;  Service: ENT;  Laterality: N/A;  . NASAL SINUS SURGERY    . RIGHT/LEFT HEART CATH AND CORONARY ANGIOGRAPHY N/A 08/21/2016   Procedure: Right/Left Heart Cath and Coronary Angiography;  Surgeon: 10/21/2016, MD;  [facility]: [facility] INVASIVE CV LAB;  Service: Cardiovascular;  Laterality: N/A;    Home Medications:  Allergies as of 04/24/2019   No Known Allergies     Medication List       Accurate as of April 24, 2019  1:44 PM. If you have any questions, ask your nurse or doctor.        aspirin EC 81 MG tablet Take 81 mg by mouth daily.   carvedilol 12.5 MG tablet Commonly known as: COREG Take 1.5 tablets (18.75 mg total) by mouth 2 (two) times daily with a meal.   glipiZIDE 10 MG 24 hr tablet Commonly known as: GLUCOTROL XL Take 10 mg by mouth daily.   Invokana 300 MG Tabs tablet Generic drug: canagliflozin Take 300 mg by mouth daily before breakfast.   isosorbide mononitrate 30 MG 24 hr tablet Commonly known as: IMDUR Take 0.5 tablets (15 mg total)  by mouth daily.   Jardiance 25 MG Tabs tablet Generic drug: empagliflozin Take 25 mg by mouth daily.   lisinopril 10 MG tablet Commonly known as: ZESTRIL Take 1 tablet (10 mg total) by mouth daily.   Myrbetriq 25 MG Tb24 tablet Generic drug: mirabegron ER Take 25 mg by mouth daily as needed. Bladder spasms   nitroGLYCERIN 0.4 MG SL tablet Commonly known as: NITROSTAT Place 0.4 mg under the tongue every 5 (five) minutes as needed for chest pain.   Rybelsus 3 MG Tabs Generic drug: Semaglutide Take 3 mg by mouth daily.   sildenafil 20 MG tablet Commonly known as: REVATIO Take 20 mg by mouth daily as needed.   simvastatin 40 MG tablet Commonly known as: ZOCOR Take 1 tablet (40 mg total) by mouth at bedtime.   tamsulosin 0.4 MG Caps  capsule Commonly known as: FLOMAX Take 0.8 mg by mouth daily as needed. Bladder spasms       Allergies: No Known Allergies  Family History: Family History  Problem Relation Age of Onset  . Coronary artery disease Other        UNKNOWN  . Heart disease Father        CAD s/p CABG  . Diabetes Mellitus II Father   . Diabetes Mellitus II Mother   . Cancer Brother     Social History:  reports that he has never smoked. He has never used smokeless tobacco. He reports that he does not drink alcohol or use drugs.  ROS: All other review of systems were reviewed and are negative except what is noted above in HPI  Physical Exam: BP (!) 147/97   Pulse 96   Temp (!) 97.5 F (36.4 C)   Ht 5\' 10"  (1.778 m)   Wt 235 lb (106.6 kg)   BMI 33.72 kg/m   Constitutional:  Alert and oriented, No acute distress. HEENT: [place] AT, moist mucus membranes.  Trachea midline, no masses. Cardiovascular: No clubbing, cyanosis, or edema. Respiratory: Normal respiratory effort, no increased work of breathing. GI: Abdomen is soft, nontender, nondistended, no abdominal masses GU: No CVA tenderness Lymph: No cervical or inguinal lymphadenopathy. Skin: No rashes, bruises or suspicious lesions. Neurologic: Grossly intact, no focal deficits, moving all 4 extremities. Psychiatric: Normal mood and affect.  Laboratory Data: Lab Results  Component Value Date   WBC 7.3 01/18/2019   HGB 16.6 01/18/2019   HCT 49.6 01/18/2019   MCV 88.7 01/18/2019   PLT 287 01/18/2019    Lab Results  Component Value Date   CREATININE 0.88 01/18/2019    No results found for: PSA  No results found for: TESTOSTERONE  Lab Results  Component Value Date   HGBA1C 7.8 (H) 01/18/2019    Urinalysis    Component Value Date/Time   COLORURINE RED (A) 11/18/2018 0433   APPEARANCEUR TURBID (A) 11/18/2018 0433   LABSPEC  11/18/2018 0433    TEST NOT REPORTED DUE TO COLOR INTERFERENCE OF URINE PIGMENT   PHURINE  11/18/2018 0433     TEST NOT REPORTED DUE TO COLOR INTERFERENCE OF URINE PIGMENT   GLUCOSEU (A) 11/18/2018 0433    TEST NOT REPORTED DUE TO COLOR INTERFERENCE OF URINE PIGMENT   HGBUR (A) 11/18/2018 0433    TEST NOT REPORTED DUE TO COLOR INTERFERENCE OF URINE PIGMENT   BILIRUBINUR neg 04/24/2019 1327   KETONESUR (A) 11/18/2018 0433    TEST NOT REPORTED DUE TO COLOR INTERFERENCE OF URINE PIGMENT   PROTEINUR Positive (A) 04/24/2019 1327  PROTEINUR (A) 11/18/2018 0433    TEST NOT REPORTED DUE TO COLOR INTERFERENCE OF URINE PIGMENT   UROBILINOGEN 0.2 04/24/2019 1327   NITRITE neg 04/24/2019 1327   NITRITE (A) 11/18/2018 0433    TEST NOT REPORTED DUE TO COLOR INTERFERENCE OF URINE PIGMENT   LEUKOCYTESUR Negative 04/24/2019 1327   LEUKOCYTESUR (A) 11/18/2018 0433    TEST NOT REPORTED DUE TO COLOR INTERFERENCE OF URINE PIGMENT    Lab Results  Component Value Date   BACTERIA MANY (A) 11/18/2018    Pertinent Imaging:  No results found for this or any previous visit. No results found for this or any previous visit. No results found for this or any previous visit. No results found for this or any previous visit. No results found for this or any previous visit. No results found for this or any previous visit. No results found for this or any previous visit. No results found for this or any previous visit.  Assessment & Plan:    1. Nocturia -We will start uroxatral 10mg  qhs - POCT urinalysis dipstick - BLADDER SCAN AMB NON-IMAGING  2. Benign prostatic hyperplasia with urinary obstruction -We will start uroxatral 10mg  qhs  3. Erectile dysfunction: -He will confirm his medication list and if he is not on Imdur then we will proceed with tadalafil   Return in about 4 weeks (around 05/22/2019).  Nicolette Bang, MD  [facility] [place]

## 2019-04-24 NOTE — Progress Notes (Signed)
Urological Symptom Review  Patient is experiencing the following symptoms: Blood in urine Frequent urination Hard to postpone urination Burning/pain with urination Get up at night to urinate Leakage of urine Stream starts and stops Trouble starting stream Weak stream Erection problems (male only) Penile pain (male only)    Review of Systems  Gastrointestinal (upper)  : Indigestion/heartburn  Gastrointestinal (lower) : Constipation  Constitutional : Night Sweats Fatigue  Skin: Itching  Eyes: Blurred vision Double vision  Ear/Nose/Throat : Sinus problems  Hematologic/Lymphatic: Easy bruising  Cardiovascular : Chest pain  Respiratory : Shortness of breath  Endocrine: Excessive thirst  Musculoskeletal: Back pain Joint pain  Neurological: Headaches Dizziness  Psychologic: Depression Anxiety

## 2019-04-24 NOTE — Patient Instructions (Signed)

## 2019-04-25 ENCOUNTER — Other Ambulatory Visit: Payer: Self-pay

## 2019-04-25 ENCOUNTER — Telehealth: Payer: Self-pay | Admitting: Urology

## 2019-04-25 DIAGNOSIS — N138 Other obstructive and reflux uropathy: Secondary | ICD-10-CM

## 2019-04-25 MED ORDER — ALFUZOSIN HCL ER 10 MG PO TB24: 10.0000 mg | ORAL_TABLET | Freq: Every day | ORAL | 0 refills | Status: DC

## 2019-04-25 NOTE — Telephone Encounter (Signed)
Pt. Called and asked was ED med sent in today also. I looked at last note and pt was to let Dr. Ronne Binning know if on Imdur or not to get Ed med. Pt confirmed he was NOT taking Imdur. Explained to pt I would task Dr. Ronne Binning and wait for him to send rx back with instructions.

## 2019-04-25 NOTE — Telephone Encounter (Signed)
Rx sent and pt. Notified.

## 2019-04-25 NOTE — Telephone Encounter (Signed)
Pt. called asking for ED med rx I looked in last note. Stated if pt not on Imdur. Pt. Confirmed he is NOT. I explained  I would task you to ask instructions for rx.

## 2019-04-25 NOTE — Telephone Encounter (Signed)
Pt called and states his medication was not at the pharmacy after his visit yesterday.

## 2019-04-26 MED ORDER — TADALAFIL 20 MG PO TABS: 20.0000 mg | ORAL_TABLET | Freq: Every day | ORAL | 5 refills | Status: DC | PRN

## 2019-04-26 NOTE — Addendum Note (Signed)
Addended by: Malen Gauze on: 04/26/2019 09:54 AM   Modules accepted: Orders

## 2019-04-26 NOTE — Telephone Encounter (Signed)
I have sent rx to [facility] for tadalafil

## 2019-05-03 ENCOUNTER — Ambulatory Visit: Payer: Medicare Other | Admitting: Urology

## 2019-05-16 ENCOUNTER — Telehealth: Payer: Self-pay | Admitting: Urology

## 2019-05-16 NOTE — Telephone Encounter (Signed)
See note

## 2019-05-16 NOTE — Telephone Encounter (Signed)
Pt left Vm and stated that he has called several times before and no one has called him back. I see no phone notes regarding the issues he is having. He states that he is having issues with his bladder at night.

## 2019-05-16 NOTE — Telephone Encounter (Signed)
Pt reports the alfuzosin is helping during the day but at night pt is still waking up " wet" from voiding on self per pt. Pt has sleep apnea but does not wear his c pap per pt. Pt is asking what other medications can he try to help with the incontinence.

## 2019-05-19 ENCOUNTER — Ambulatory Visit (INDEPENDENT_AMBULATORY_CARE_PROVIDER_SITE_OTHER): Payer: Medicare Other | Admitting: Urology

## 2019-05-19 ENCOUNTER — Other Ambulatory Visit: Payer: Self-pay

## 2019-05-19 ENCOUNTER — Other Ambulatory Visit (HOSPITAL_COMMUNITY)
Admission: RE | Admit: 2019-05-19 | Discharge: 2019-05-19 | Disposition: A | Discharge status: Home / Self Care | Payer: Medicare Other | Source: Other Acute Inpatient Hospital | Attending: Urology | Admitting: Urology

## 2019-05-19 VITALS — BP 127/86 | HR 102 | Temp 97.7°F | Ht 70.0 in | Wt 234.0 lb

## 2019-05-19 DIAGNOSIS — R339 Retention of urine, unspecified: Secondary | ICD-10-CM

## 2019-05-19 DIAGNOSIS — N3091 Cystitis, unspecified with hematuria: Secondary | ICD-10-CM | POA: Insufficient documentation

## 2019-05-19 LAB — URINALYSIS, ROUTINE W REFLEX MICROSCOPIC
Bilirubin Urine: NEGATIVE
Glucose, UA: >=500 mg/dL — AB
Ketones, ur: 20 mg/dL — AB
Leukocytes,Ua: NEGATIVE
Nitrite: NEGATIVE
Protein, ur: >=300 mg/dL — AB
Specific Gravity, Urine: 1.031 — ABNORMAL HIGH (ref 1.005–1.030)
pH: 6 (ref 5.0–8.0)

## 2019-05-19 LAB — POCT URINALYSIS DIPSTICK
Bilirubin, UA: NEGATIVE
Glucose, UA: POSITIVE — AB
Nitrite, UA: NEGATIVE
Protein, UA: POSITIVE — AB
Spec Grav, UA: >=1.03 — AB (ref 1.010–1.025)
Urobilinogen, UA: NEGATIVE E.U./dL — AB
pH, UA: 5 (ref 5.0–8.0)

## 2019-05-19 MED ORDER — CIPROFLOXACIN HCL 500 MG PO TABS: 500.0000 mg | ORAL_TABLET | Freq: Two times a day (BID) | ORAL | 0 refills | Status: DC

## 2019-05-19 NOTE — Progress Notes (Signed)
Subjective:  1. Hemorrhagic cystitis   2. Incomplete bladder emptying      [name] [name] presents today with gross hematuria with some burning and frequency.  He has some suprapubic discomfort but no other associated signs or symptoms.   His PVR is and his UA is grossly bloody.  He is on no blood thinners.   He had a staph UTI in 10/20.  He remains on alfuzosin and apparently tamsulosin as well for his history of BOO.     ROS:  ROS:  A complete review of systems was performed.  All systems are negative except for pertinent findings as noted.   Review of Systems  Constitutional: Negative for chills and fever.    No Known Allergies  Outpatient Encounter Medications as of 05/19/2019  Medication Sig  . alfuzosin (UROXATRAL) 10 MG 24 hr tablet Take 1 tablet (10 mg total) by mouth at bedtime.  Marland Kitchen aspirin EC 81 MG tablet Take 81 mg by mouth daily.  . canagliflozin (INVOKANA) 300 MG TABS tablet Take 300 mg by mouth daily before breakfast.  . carvedilol (COREG) 12.5 MG tablet Take 1.5 tablets (18.75 mg total) by mouth 2 (two) times daily with a meal.  . ciprofloxacin (CIPRO) 500 MG tablet Take 1 tablet (500 mg total) by mouth every 12 (twelve) hours.  Marland Kitchen glipiZIDE (GLUCOTROL XL) 10 MG 24 hr tablet Take 10 mg by mouth daily.  . isosorbide mononitrate (IMDUR) 30 MG 24 hr tablet Take 0.5 tablets (15 mg total) by mouth daily.  Marland Kitchen JARDIANCE 25 MG TABS tablet Take 25 mg by mouth daily.  Marland Kitchen lisinopril (ZESTRIL) 10 MG tablet Take 1 tablet (10 mg total) by mouth daily.  Marland Kitchen MYRBETRIQ 25 MG TB24 tablet Take 25 mg by mouth daily as needed. Bladder spasms  . nitroGLYCERIN (NITROSTAT) 0.4 MG SL tablet Place 0.4 mg under the tongue every 5 (five) minutes as needed for chest pain.  . Semaglutide (RYBELSUS) 3 MG TABS Take 3 mg by mouth daily.  . sildenafil (REVATIO) 20 MG tablet Take 20 mg by mouth daily as needed.   . simvastatin (ZOCOR) 40 MG tablet Take 1 tablet (40 mg total) by mouth at bedtime.  .  tadalafil (CIALIS) 20 MG tablet Take 1 tablet (20 mg total) by mouth daily as needed for erectile dysfunction.  . tamsulosin (FLOMAX) 0.4 MG CAPS capsule Take 0.8 mg by mouth daily as needed. Bladder spasms   No facility-administered encounter medications on file as of 05/19/2019.    Past Medical History:  Diagnosis Date  . Anxiety   . Arthritis    in lower back  . Coronary artery disease    a. 02/2008: anterior STEMI s/p DES to prox LAD, b. Myoview 2012: scar in the anterior wall with mild ischemia and EF 33%, c. neg GXT 06/2011  d. 08/2016: cath with DES to diagonal   . Depression with anxiety   . Diabetes mellitus    TYPE II  . Fatty liver    H/O ELEVATED LIVER ENZYMES  . High cholesterol   . Hyperlipidemia   . Hypertension   . Ischemic cardiomyopathy    a. Previous EF 33-35% 2012, b. improved to 45-50% by echo 06/2011  . LV (left ventricular) mural thrombus without MI (HCC)   . Noncompliance    Prior hx of med noncompliance  . OSA (obstructive sleep apnea)   . Sleep apnea     Past Surgical History:  Procedure Laterality Date  . CARDIAC CATHETERIZATION    .  CORONARY STENT INTERVENTION N/A 08/21/2016   Procedure: CORONARY STENT INTERVENTION;  Surgeon: Burnell Blanks, MD;  Location: [place] CV [facility];  Service: Cardiovascular;  Laterality: N/A;  . INTRAVASCULAR PRESSURE WIRE/FFR STUDY N/A 08/21/2016   Procedure: INTRAVASCULAR PRESSURE WIRE/FFR STUDY;  Surgeon: Burnell Blanks, MD;  Location: [place] CV LAB;  Service: Cardiovascular;  Laterality: N/A;  . LIVER BIOPSY    . MI WITH STENTS    . NASAL SEPTOPLASTY W/ TURBINOPLASTY N/A 01/25/2019   Procedure: BILATERAL TURBINATE REDUCTION;  Surgeon: Leta Baptist, MD;  Location: [place];  Service: ENT;  Laterality: N/A;  . NASAL SINUS SURGERY    . RIGHT/LEFT HEART CATH AND CORONARY ANGIOGRAPHY N/A 08/21/2016   Procedure: Right/Left Heart Cath and Coronary Angiography;  Surgeon: Burnell Blanks, MD;  Location: [place] CV LAB;  Service: Cardiovascular;  Laterality: N/A;    Social History   Socioeconomic History  . Marital status: Legally Separated    Spouse name: Not on file  . Number of children: Not on file  . Years of education: Not on file  . Highest education level: Not on file  Occupational History  . Not on file  Tobacco Use  . Smoking status: Never Smoker  . Smokeless tobacco: Never Used  Substance and Sexual Activity  . Alcohol use: No  . Drug use: No  . Sexual activity: Not on file  Other Topics Concern  . Not on file  Social History Narrative  . Not on file   Social Determinants of Health   Financial Resource Strain:   . Difficulty of Paying Living Expenses:   Food Insecurity:   . Worried About Charity fundraiser in the Last Year:   . Arboriculturist in the Last Year:   Transportation Needs:   . Film/video editor (Medical):   Marland Kitchen Lack of Transportation (Non-Medical):   Physical Activity:   . Days of Exercise per Week:   . Minutes of Exercise per Session:   Stress:   . Feeling of Stress :   Social Connections:   . Frequency of Communication with Friends and Family:   . Frequency of Social Gatherings with Friends and Family:   . Attends Religious Services:   . Active Member of Clubs or Organizations:   . Attends Archivist Meetings:   Marland Kitchen Marital Status:   Intimate Partner Violence:   . Fear of Current or Ex-Partner:   . Emotionally Abused:   Marland Kitchen Physically Abused:   . Sexually Abused:     Family History  Problem Relation Age of Onset  . Coronary artery disease Other        UNKNOWN  . Heart disease Father        CAD s/p CABG  . Diabetes Mellitus II Father   . Diabetes Mellitus II Mother   . Cancer Brother        Objective: There were no vitals filed for this visit.   Physical Exam Constitutional:      Appearance: Normal appearance.  Neurological:     Mental Status: He is alert.     Lab Results:  No results found for this or  any previous visit (from the past 24 hour(s)).  BMET No results for input(s): NA, K, CL, CO2, GLUCOSE, BUN, CREATININE, CALCIUM in the last 72 hours. PSA No results found for: PSA No results found for: TESTOSTERONE    Studies/Results: PVR by [place] was 231ml which is a decline.  Assessment & Plan: Hemorrhagic cystitis.  I will send a UA and culture and get him started on Cipro.  His prior culture was sensitive to Cipro but it will also give [place] good GNR coverage and tissue penetration.   He will keep his f/u with Dr. Ronne Binning on Monday.  Incomplete bladder emptying.  His PVR is down from his last visit on the alfuzosin.    Meds ordered this encounter  Medications  . ciprofloxacin (CIPRO) 500 MG tablet    Sig: Take 1 tablet (500 mg total) by mouth every 12 (twelve) hours.    Dispense:  14 tablet    Refill:  0     Orders Placed This Encounter  Procedures  . Urine Culture    Standing Status:   Future    Standing Expiration Date:   05/18/2020  . Urinalysis, Routine w reflex microscopic    Standing Status:   Future    Standing Expiration Date:   05/18/2020      No follow-ups on file.   CC: Rebekah Chesterfield, NP      Bjorn Pippin 05/19/2019

## 2019-05-19 NOTE — Telephone Encounter (Signed)
Please give mirabegron 25mg  qhs

## 2019-05-21 LAB — URINE CULTURE: Culture: >=100000 — AB

## 2019-05-22 ENCOUNTER — Other Ambulatory Visit: Payer: Self-pay

## 2019-05-22 ENCOUNTER — Encounter: Payer: Self-pay | Admitting: Urology

## 2019-05-22 ENCOUNTER — Ambulatory Visit (INDEPENDENT_AMBULATORY_CARE_PROVIDER_SITE_OTHER): Payer: Medicare Other | Admitting: Urology

## 2019-05-22 VITALS — BP 151/78 | HR 105 | Temp 97.7°F | Ht 70.0 in | Wt 235.0 lb

## 2019-05-22 DIAGNOSIS — N401 Enlarged prostate with lower urinary tract symptoms: Secondary | ICD-10-CM | POA: Diagnosis not present

## 2019-05-22 DIAGNOSIS — R339 Retention of urine, unspecified: Secondary | ICD-10-CM | POA: Diagnosis not present

## 2019-05-22 DIAGNOSIS — N138 Other obstructive and reflux uropathy: Secondary | ICD-10-CM

## 2019-05-22 LAB — POCT URINALYSIS DIPSTICK
Bilirubin, UA: NEGATIVE
Blood, UA: NEGATIVE
Glucose, UA: POSITIVE — AB
Leukocytes, UA: NEGATIVE
Nitrite, UA: NEGATIVE
Protein, UA: NEGATIVE
Spec Grav, UA: 1.01 (ref 1.010–1.025)
Urobilinogen, UA: 0.2 E.U./dL
pH, UA: 5 (ref 5.0–8.0)

## 2019-05-22 LAB — BLADDER SCAN AMB NON-IMAGING: Scan Result: 431.8

## 2019-05-22 MED ORDER — ALFUZOSIN HCL ER 10 MG PO TB24: 10.0000 mg | ORAL_TABLET | Freq: Every day | ORAL | 0 refills | Status: DC

## 2019-05-22 NOTE — Patient Instructions (Signed)
Urodynamic Testing What is urodynamic testing?  Urodynamic testing is a set of tests and X-rays. These tests help to find out how well your bladder and the part of your body that drains pee from the bladder (urethra) are working. Why do I need this testing? You may need these tests if you:  Are leaking pee (urine).  Have trouble starting or stopping peeing (urination).  Pee often or it hurts to pee.  Have urinary tract infections often.  Are not able to empty your bladder.  Have strong urges to pee.  Have a weak flow of pee. How do I prepare for the tests?  Ask your doctor about: ? Changing or stopping your normal medicines. This is important if you take diabetes medicines or blood thinners. ? Whether you should arrive for the test having to pee.  Tell your doctor about: ? Any allergies you have. ? All medicines you are taking, including vitamins, herbs, eye drops, creams, and over-the-counter medicines. ? Whether you are pregnant or may be pregnant. What are the risks? In general, these tests are safe. But some of the tests have risks. These may include:  Discomfort.  Feeling a need to pee often.  Bleeding.  Infection.  Allergic reactions to medicines or dyes. How are the tests done? The tests may be done in one visit or may be done over a few visits. You may be given an antibiotic medicine to help keep you from getting an infection. The types of tests done may include: Uroflowmetry This test measures how much you pee and how long it takes. You will pee into a type of toilet or device that sends measurements to a computer. Postvoid residual measurement This test measures how much pee is left in your bladder after you pee. It may be done by:  Using sound waves and a computer to create pictures of your bladder (ultrasound).  Putting a thin, flexible tube (catheter) into your bladder to take out the pee that is left so it can be measured. Cystometric testing This  test measures how much pressure there is in your bladder before you pee and as you pee.  You may be given a medicine to numb the area (local anesthetic).  The area around the opening of your urethra will be cleaned.  A thin, flexible tube will be used to empty your bladder.  A flexible tube that can measure pressure will then be placed. Your bladder will be filled with germ-free water.  Pressure will be measured: ? As your bladder fills. ? When you feel the need to pee. ? As your bladder is emptied.  In some cases, your bladder may be filled with a dye that shows up on X-rays (contrast material) so that X-ray pictures can be taken during the test. Electromyogram This test measures the activity of the nerves and muscles in your bladder and in the tube that empties your bladder. Sticky patches (electrodes) will be placed on your body to measure electrical activity. What happens after the testing?  You should be able to go home right away.  You can do your usual activities.  You may be told to drink a glass of water every 30 minutes. Do this for the first 2 hours you are home.  Taking a warm bath or using a warm, wet towel may relieve any discomfort. Let your doctor know if you have:  Pain.  Blood in your pee.  Chills.  Fever. What do my test results mean? Talk   with your doctor about what your results mean. These test results can help your doctor find out how well your bladder and the tube that empties your bladder are working. Your results and your symptoms can help your doctor find what might be causing your problems. Questions to ask your doctor Ask your doctor, or the department that is doing the test:  When will my results be ready?  How will I get my results?  What are my treatment options?  What other tests do I need?  What are my next steps? Summary  Urodynamic testing is a set of tests and X-rays.  These tests help to find out how well your bladder and the  part of your body that drains pee from the bladder are working.  Your results and your symptoms can help your doctor find what might be causing your problems.  Talk with your doctor about what your results mean. This information is not intended to replace advice given to you by your health care provider. Make sure you discuss any questions you have with your health care provider. Document Revised: 04/26/2018 Document Reviewed: 11/09/2016 Elsevier Patient Education  2020 Elsevier Inc.  

## 2019-05-22 NOTE — Progress Notes (Signed)
Urological Symptom Review  Patient is experiencing the following symptoms: Frequent urination Burning/pain with urination Get up at night to urinate Leakage of urine Stream starts and stops Trouble starting stream Weak stream Erection problems (male only) Penile pain (male only)    Review of Systems  Gastrointestinal (upper)  : Indigestion/heartburn  Gastrointestinal (lower) : Constipation  Constitutional : Fatigue  Skin: Itching  Eyes: Blurred vision Double vision  Ear/Nose/Throat : Negative for Ear/Nose/Throat symptoms  Hematologic/Lymphatic: Negative for Hematologic/Lymphatic symptoms  Cardiovascular : Chest pain  Respiratory : Shortness of breath  Endocrine: Excessive thirst  Musculoskeletal: Back pain  Neurological: Headaches Dizziness  Psychologic: Depression Anxiety

## 2019-05-22 NOTE — Progress Notes (Signed)
05/22/2019 1:24 PM   Aloha Gell. [birth year] [id number]  Referring provider: Adaline Sill, NP [address] [place],  [place] [address]  Incomplete emptying  HPI: Mr Forget is a 54yo here for followup for BPH and incomplete emptying. He was seen on 4/30 for hematuria and was started on cipro and the hematuria has resolved. He has improvement in his stream, nocturia, urinary frequency since starting the cipro. He is currently on uroxatral and flomax. PVR today is over 400cc.   PMH: Past Medical History:  Diagnosis Date  . Anxiety   . Arthritis    in lower back  . Coronary artery disease    a. 02/2008: anterior STEMI s/p DES to prox LAD, b. Myoview 2012: scar in the anterior wall with mild ischemia and EF 33%, c. neg GXT 06/2011  d. 08/2016: cath with DES to diagonal   . Depression with anxiety   . Diabetes mellitus    TYPE II  . Fatty liver    H/O ELEVATED LIVER ENZYMES  . High cholesterol   . Hyperlipidemia   . Hypertension   . Ischemic cardiomyopathy    a. Previous EF 33-35% 2012, b. improved to 45-50% by echo 06/2011  . LV (left ventricular) mural thrombus without MI ([facility])   . Noncompliance    Prior hx of med noncompliance  . OSA (obstructive sleep apnea)   . Sleep apnea     Surgical History: Past Surgical History:  Procedure Laterality Date  . CARDIAC CATHETERIZATION    . CORONARY STENT INTERVENTION N/A 08/21/2016   Procedure: CORONARY STENT INTERVENTION;  Surgeon: Burnell Blanks, MD;  Location: [facility];  Service: Cardiovascular;  Laterality: N/A;  . INTRAVASCULAR PRESSURE WIRE/FFR STUDY N/A 08/21/2016   Procedure: INTRAVASCULAR PRESSURE WIRE/FFR STUDY;  Surgeon: Burnell Blanks, MD;  Location: [facility];  Service: Cardiovascular;  Laterality: N/A;  . LIVER BIOPSY    . MI WITH STENTS    . NASAL SEPTOPLASTY W/ TURBINOPLASTY N/A 01/25/2019   Procedure: BILATERAL TURBINATE REDUCTION;  Surgeon: Leta Baptist, MD;  Location: [facility];   Service: ENT;  Laterality: N/A;  . NASAL SINUS SURGERY    . RIGHT/LEFT HEART CATH AND CORONARY ANGIOGRAPHY N/A 08/21/2016   Procedure: Right/Left Heart Cath and Coronary Angiography;  Surgeon: Burnell Blanks, MD;  Location: [facility];  Service: Cardiovascular;  Laterality: N/A;    Home Medications:  Allergies as of 05/22/2019   No Known Allergies     Medication List       Accurate as of May 22, 2019  1:24 PM. If you have any questions, ask your nurse or doctor.        alfuzosin 10 MG 24 hr tablet Commonly known as: UROXATRAL Take 1 tablet (10 mg total) by mouth at bedtime.   aspirin EC 81 MG tablet Take 81 mg by mouth daily.   carvedilol 12.5 MG tablet Commonly known as: COREG Take 1.5 tablets (18.75 mg total) by mouth 2 (two) times daily with a meal.   ciprofloxacin 500 MG tablet Commonly known as: CIPRO Take 1 tablet (500 mg total) by mouth every 12 (twelve) hours.   glipiZIDE 10 MG 24 hr tablet Commonly known as: GLUCOTROL XL Take 10 mg by mouth daily.   Invokana 300 MG Tabs tablet Generic drug: canagliflozin Take 300 mg by mouth daily before breakfast.   isosorbide mononitrate 30 MG 24 hr tablet Commonly known as: IMDUR Take  0.5 tablets (15 mg total) by mouth daily.   Jardiance 25 MG Tabs tablet Generic drug: empagliflozin Take 25 mg by mouth daily.   lisinopril 10 MG tablet Commonly known as: ZESTRIL Take 1 tablet (10 mg total) by mouth daily.   Myrbetriq 25 MG Tb24 tablet Generic drug: mirabegron ER Take 25 mg by mouth daily as needed. Bladder spasms   nitroGLYCERIN 0.4 MG SL tablet Commonly known as: NITROSTAT Place 0.4 mg under the tongue every 5 (five) minutes as needed for chest pain.   Rybelsus 3 MG Tabs Generic drug: Semaglutide Take 3 mg by mouth daily.   sildenafil 20 MG tablet Commonly known as: REVATIO Take 20 mg by mouth daily as needed.   simvastatin 40 MG tablet Commonly known as: ZOCOR Take 1 tablet (40 mg total)  by mouth at bedtime.   tadalafil 20 MG tablet Commonly known as: CIALIS Take 1 tablet (20 mg total) by mouth daily as needed for erectile dysfunction.   tamsulosin 0.4 MG Caps capsule Commonly known as: FLOMAX Take 0.8 mg by mouth daily as needed. Bladder spasms       Allergies: No Known Allergies  Family History: Family History  Problem Relation Age of Onset  . Coronary artery disease Other        UNKNOWN  . Heart disease Father        CAD s/p CABG  . Diabetes Mellitus II Father   . Diabetes Mellitus II Mother   . Cancer Brother     Social History:  reports that he has never smoked. He has never used smokeless tobacco. He reports that he does not drink alcohol or use drugs.  ROS: All other review of systems were reviewed and are negative except what is noted above in HPI  Physical Exam: BP (!) 151/78   Pulse (!) 105   Temp 97.7 F (36.5 C)   Ht 5\' 10"  (1.778 m)   Wt 235 lb (106.6 kg)   BMI 33.72 kg/m   Constitutional:  Alert and oriented, No acute distress. HEENT: [place] AT, moist mucus membranes.  Trachea midline, no masses. Cardiovascular: No clubbing, cyanosis, or edema. Respiratory: Normal respiratory effort, no increased work of breathing. GI: Abdomen is soft, nontender, nondistended, no abdominal masses GU: No CVA tenderness.  Lymph: No cervical or inguinal lymphadenopathy. Skin: No rashes, bruises or suspicious lesions. Neurologic: Grossly intact, no focal deficits, moving all 4 extremities. Psychiatric: Normal mood and affect.  Laboratory Data: Lab Results  Component Value Date   WBC 7.3 01/18/2019   HGB 16.6 01/18/2019   HCT 49.6 01/18/2019   MCV 88.7 01/18/2019   PLT 287 01/18/2019    Lab Results  Component Value Date   CREATININE 0.88 01/18/2019    No results found for: PSA  No results found for: TESTOSTERONE  Lab Results  Component Value Date   HGBA1C 7.8 (H) 01/18/2019    Urinalysis    Component Value Date/Time   COLORURINE  AMBER (A) 05/19/2019 1600   APPEARANCEUR HAZY (A) 05/19/2019 1600   LABSPEC 1.031 (H) 05/19/2019 1600   PHURINE 6.0 05/19/2019 1600   GLUCOSEU >=500 (A) 05/19/2019 1600   HGBUR LARGE (A) 05/19/2019 1600   BILIRUBINUR neg 05/22/2019 1318   KETONESUR 20 (A) 05/19/2019 1600   PROTEINUR Negative 05/22/2019 1318   PROTEINUR >=300 (A) 05/19/2019 1600   UROBILINOGEN 0.2 05/22/2019 1318   NITRITE neg 05/22/2019 1318   NITRITE NEGATIVE 05/19/2019 1600   LEUKOCYTESUR Negative 05/22/2019 1318  LEUKOCYTESUR NEGATIVE 05/19/2019 1600    Lab Results  Component Value Date   BACTERIA FEW (A) 05/19/2019    Pertinent Imaging:  No results found for this or any previous visit. No results found for this or any previous visit. No results found for this or any previous visit. No results found for this or any previous visit. No results found for this or any previous visit. No results found for this or any previous visit. No results found for this or any previous visit. No results found for this or any previous visit.  Assessment & Plan:    1. Incomplete bladder emptying -Schedule for UDS due to hx of DMII and urinary retention - POCT urinalysis dipstick - BLADDER SCAN AMB NON-IMAGING  2. Benign prostatic hyperplasia with urinary obstruction -continue flomax and uroaxtral   No follow-ups on file.  Wilkie Aye, MD  [facility] [place]

## 2019-05-24 ENCOUNTER — Telehealth: Payer: Self-pay

## 2019-05-24 ENCOUNTER — Other Ambulatory Visit: Payer: Self-pay

## 2019-05-24 DIAGNOSIS — N390 Urinary tract infection, site not specified: Secondary | ICD-10-CM

## 2019-05-24 DIAGNOSIS — R319 Hematuria, unspecified: Secondary | ICD-10-CM

## 2019-05-24 MED ORDER — SULFAMETHOXAZOLE-TRIMETHOPRIM 800-160 MG PO TABS: 1.0000 | ORAL_TABLET | Freq: Two times a day (BID) | ORAL | 0 refills | Status: AC

## 2019-05-24 NOTE — Telephone Encounter (Signed)
Pt. called and made aware of new abt. rx.

## 2019-06-07 ENCOUNTER — Telehealth: Payer: Self-pay | Admitting: Urology

## 2019-06-07 NOTE — Telephone Encounter (Signed)
Pt asking if urodynamics are necessary. Reports " I want him to check my prostate before going through this procedure. The antibiotics he placed me on has helped too with my flow" any suggestions on moving forward with urodynamics. Pt doesn't want it to be done if not necessary.Marland KitchenMarland Kitchen

## 2019-06-07 NOTE — Telephone Encounter (Signed)
Pt called and stated the procedure he is scheduled for is painful to him. He would like to know if it possible to check for prostate problems before going through with the procedure. He would also like a call back asap.

## 2019-06-08 NOTE — Telephone Encounter (Signed)
He needs it due to hx of diabetes

## 2019-06-08 NOTE — Telephone Encounter (Signed)
Pt made aware and will keep urodynamic appt

## 2019-06-16 ENCOUNTER — Encounter: Payer: Self-pay | Admitting: Cardiology

## 2019-06-16 ENCOUNTER — Other Ambulatory Visit: Payer: Self-pay

## 2019-06-16 ENCOUNTER — Telehealth: Payer: Self-pay

## 2019-06-16 ENCOUNTER — Ambulatory Visit (INDEPENDENT_AMBULATORY_CARE_PROVIDER_SITE_OTHER): Payer: Medicare Other | Admitting: Cardiology

## 2019-06-16 VITALS — BP 118/82 | HR 97 | Ht 70.0 in | Wt 234.0 lb

## 2019-06-16 DIAGNOSIS — R0602 Shortness of breath: Secondary | ICD-10-CM | POA: Diagnosis not present

## 2019-06-16 DIAGNOSIS — I5022 Chronic systolic (congestive) heart failure: Secondary | ICD-10-CM | POA: Diagnosis not present

## 2019-06-16 MED ORDER — SPIRONOLACTONE 25 MG PO TABS
12.5000 mg | ORAL_TABLET | Freq: Every day | ORAL | 1 refills | Status: DC
Start: 2019-06-16 — End: 2020-08-19

## 2019-06-16 NOTE — Patient Instructions (Addendum)
Your physician recommends that you schedule a follow-up appointment in: 2 MONTHS WITH DR [facility]  Your physician has recommended you make the following change in your medication:   START ALDACTONE 12.5 MG (1/2 TABLET) DAILY   STOP IMDUR  Your physician recommends that you return for lab work in: 2 WEEKS BMP  Your physician has requested that you have an echocardiogram. Echocardiography is a painless test that uses sound waves to create images of your heart. It provides your doctor with information about the size and shape of your heart and how well your heart's chambers and valves are working. This procedure takes approximately one hour. There are no restrictions for this procedure.  Thank you for choosing [facility]!!

## 2019-06-16 NOTE — Progress Notes (Signed)
Clinical Summary Mr. Balles is a 54 y.o.male seen today for follow up of the following medical problems.   1. CAD/ICM/Chronic systolic HF - history of anterior STEMI 02/2008, received DES to LAD. LVEF at that time was 35% - 2012 myoview anterior scar with mild ischemia - 06/2011 echo LVEF 45-50% - 06/2011 GXT no ischemia - 07/2016 echo shows LVEF down to 30-35%, diffuse hypokinesis with akinesis of anerior and anteroseptal walls. Evidence of LV thrombus - with drop in LVEF and ongoing chest pain was referred for cath  08/2016 cath: see report below, received DES to D2 99%. Ok to stop ASA once coumadin is therapeutic per interventional cardiology, continue plavix.  11/2016 echo LVEF 35%, apical thrombus - 02/2017 echo LVEF 35-40%, restrictive, no clear thrombus  Jan 2020 nuclear stress: large anterior/anteroseptal scar, LVEF 22%.    - chest pain midchest, burning like feeling. Can occur at any time. 3-4/10 in severity. Lasts a few hours constant. Not positional. Better with alka seltzer at times. Last episode some time ago.  -SOB/DOE recently. DOE just walking short distances. No recent edema. No coughing or wheezing. No orthopnea.  - compliant with meds  - did not start aldactone as prescribed previously, he is not sure if he started imdur.   2. History of prior LV thrombus - noted on11/2018 echo -repeat echo 02/2017 showed resolution of clot, coumadin was stopped.   3. HTN - did not start aldactone.    4. OSA - has prior diagnosis. Not wearing cpap due to discomfort. SIgnificant daytime somonlence. - pcp referred again for sleep study but could not afford.   Past Medical History:  Diagnosis Date  . Anxiety   . Arthritis    in lower back  . Coronary artery disease    a. 02/2008: anterior STEMI s/p DES to prox LAD, b. Myoview 2012: scar in the anterior wall with mild ischemia and EF 33%, c. neg GXT 06/2011  d. 08/2016: cath with DES to diagonal   . Depression  with anxiety   . Diabetes mellitus    TYPE II  . Fatty liver    H/O ELEVATED LIVER ENZYMES  . High cholesterol   . Hyperlipidemia   . Hypertension   . Ischemic cardiomyopathy    a. Previous EF 33-35% 2012, b. improved to 45-50% by echo 06/2011  . LV (left ventricular) mural thrombus without MI ([facility])   . Noncompliance    Prior hx of med noncompliance  . OSA (obstructive sleep apnea)   . Sleep apnea      No Known Allergies   Current Outpatient Medications  Medication Sig Dispense Refill  . alfuzosin (UROXATRAL) 10 MG 24 hr tablet Take 1 tablet (10 mg total) by mouth at bedtime. 30 tablet 0  . aspirin EC 81 MG tablet Take 81 mg by mouth daily.    . canagliflozin (INVOKANA) 300 MG TABS tablet Take 300 mg by mouth daily before breakfast.    . carvedilol (COREG) 12.5 MG tablet Take 1.5 tablets (18.75 mg total) by mouth 2 (two) times daily with a meal. 270 tablet 1  . ciprofloxacin (CIPRO) 500 MG tablet Take 1 tablet (500 mg total) by mouth every 12 (twelve) hours. 14 tablet 0  . glipiZIDE (GLUCOTROL XL) 10 MG 24 hr tablet Take 10 mg by mouth daily.    . isosorbide mononitrate (IMDUR) 30 MG 24 hr tablet Take 0.5 tablets (15 mg total) by mouth daily. 45 tablet 1  . JARDIANCE  25 MG TABS tablet Take 25 mg by mouth daily.    Marland Kitchen lisinopril (ZESTRIL) 10 MG tablet Take 1 tablet (10 mg total) by mouth daily. 90 tablet 1  . MYRBETRIQ 25 MG TB24 tablet Take 25 mg by mouth daily as needed. Bladder spasms    . nitroGLYCERIN (NITROSTAT) 0.4 MG SL tablet Place 0.4 mg under the tongue every 5 (five) minutes as needed for chest pain.    . Semaglutide (RYBELSUS) 3 MG TABS Take 3 mg by mouth daily.    . sildenafil (REVATIO) 20 MG tablet Take 20 mg by mouth daily as needed.     . simvastatin (ZOCOR) 40 MG tablet Take 1 tablet (40 mg total) by mouth at bedtime. 90 tablet 1  . tadalafil (CIALIS) 20 MG tablet Take 1 tablet (20 mg total) by mouth daily as needed for erectile dysfunction. 10 tablet 5  .  tamsulosin (FLOMAX) 0.4 MG CAPS capsule Take 0.8 mg by mouth daily as needed. Bladder spasms     No current facility-administered medications for this visit.     Past Surgical History:  Procedure Laterality Date  . CARDIAC CATHETERIZATION    . CORONARY STENT INTERVENTION N/A 08/21/2016   Procedure: CORONARY STENT INTERVENTION;  Surgeon: Burnell Blanks, MD;  Location: [facility];  Service: Cardiovascular;  Laterality: N/A;  . INTRAVASCULAR PRESSURE WIRE/FFR STUDY N/A 08/21/2016   Procedure: INTRAVASCULAR PRESSURE WIRE/FFR STUDY;  Surgeon: Burnell Blanks, MD;  Location: [facility];  Service: Cardiovascular;  Laterality: N/A;  . LIVER BIOPSY    . MI WITH STENTS    . NASAL SEPTOPLASTY W/ TURBINOPLASTY N/A 01/25/2019   Procedure: BILATERAL TURBINATE REDUCTION;  Surgeon: Leta Baptist, MD;  Location: [facility];  Service: ENT;  Laterality: N/A;  . NASAL SINUS SURGERY    . RIGHT/LEFT HEART CATH AND CORONARY ANGIOGRAPHY N/A 08/21/2016   Procedure: Right/Left Heart Cath and Coronary Angiography;  Surgeon: Burnell Blanks, MD;  Location: [facility];  Service: Cardiovascular;  Laterality: N/A;     No Known Allergies    Family History  Problem Relation Age of Onset  . Coronary artery disease Other        UNKNOWN  . Heart disease Father        CAD s/p CABG  . Diabetes Mellitus II Father   . Diabetes Mellitus II Mother   . Cancer Brother      Social History Mr. Lovelady reports that he has never smoked. He has never used smokeless tobacco. Mr. Tritz reports no history of alcohol use.   Review of Systems CONSTITUTIONAL: No weight loss, fever, chills, weakness or fatigue.  HEENT: Eyes: No visual loss, blurred vision, double vision or yellow sclerae.No hearing loss, sneezing, congestion, runny nose or sore throat.  SKIN: No rash or itching.  CARDIOVASCULAR: per hpi RESPIRATORY: No shortness of breath, cough or sputum.  GASTROINTESTINAL: No anorexia, nausea,  vomiting or diarrhea. No abdominal pain or blood.  GENITOURINARY: No burning on urination, no polyuria NEUROLOGICAL: No headache, dizziness, syncope, paralysis, ataxia, numbness or tingling in the extremities. No change in bowel or bladder control.  MUSCULOSKELETAL: No muscle, back pain, joint pain or stiffness.  LYMPHATICS: No enlarged nodes. No history of splenectomy.  PSYCHIATRIC: No history of depression or anxiety.  ENDOCRINOLOGIC: No reports of sweating, cold or heat intolerance. No polyuria or polydipsia.  Marland Kitchen   Physical Examination Today's Vitals   06/16/19 1142  BP: 118/82  Pulse: 97  SpO2:  98%  Weight: 234 lb (106.1 kg)  Height: 5\' 10"  (1.778 m)   Body mass index is 33.58 kg/m.  Gen: resting comfortably, no acute distress HEENT: no scleral icterus, pupils equal round and reactive, no palptable cervical adenopathy,  CV: RRR, no m/r/g, no jvd Resp: Clear to auscultation bilaterally GI: abdomen is soft, non-tender, non-distended, normal bowel sounds, no hepatosplenomegaly MSK: extremities are warm, no edema.  Skin: warm, no rash Neuro:  no focal deficits Psych: appropriate affect   Diagnostic Studies 6./2013 echo Study Conclusions  - Left ventricle: Systolic function was mildly reduced. The estimated ejection fraction was in the range of 45% to 50%. - Left atrium: The atrium was mildly dilated.  08/07/16 echo Study Conclusions  - Left ventricle: LV systolic function is depressed at approximately 30 to 35% with severe hypokinesis/akinesis of the inferior, septal, distal lateral and apical walls. Cannot exclude mural thrombus at apex. Suggest limited echo with Definity to define The cavity size was normal. Wall thickness was normal. - Mitral valve: There was mild regurgitation. - Left atrium: The atrium was mildly dilated.  08/14/16 echo Study Conclusions  - Procedure narrative: Transthoracic echocardiography. Image quality was adequate. The  study was technically difficult. Intravenous contrast (Definity) was administered. - Left ventricle: Systolic function was moderately to severely reduced. The estimated ejection fraction was in the range of 30% to 35%. Diffuse hypokinesis. There is akinesis of the anteroseptal, anterior, and apical myocardium. - Right ventricle: Systolic function was normal.  Impressions:  - Limited study with Definity contrast. LVEF is in the range of 30-35%. There is a loosely organized LV apical thrombus noted. Diffuse hypokinesis overall with akinesis of the anterior, anteroseptal, and apical myocardium. RV contraction normal based on limited views.  08/2016 cath  Mid RCA-2 lesion, 30 %stenosed.  Mid RCA-1 lesion, 30 %stenosed.  Ost Ramus to Ramus lesion, 20 %stenosed.  Ost LAD to Prox LAD lesion, 10 %stenosed.  Prox LAD to Mid LAD lesion, 60 %stenosed.  A STENT SYNERGY DES 2.25X12 drug eluting stent was successfully placed.  2nd Diag lesion, 99 %stenosed.  Post intervention, there is a 0% residual stenosis.  1. Patent stent proximal LAD 2. Moderate stenosis mid LAD. FFR was 0.84 suggesting the stenosis was not flow limiting.  3. The Diagonal Sophiah Rolin is a moderate caliber vessel with a 99% stenosis.  4. Successful PTCA/DES x 1 Diagonal Nyimah Shadduck 5. Mild disease RCA  Recommendations: Will continue ASA and Plavix for now. He will need to be started on coumadin tomorrow before discharge for his LV thrombus. He can stop ASA when his INR is therapeutic on coumadin since he was not an ACS. I would plan long term coumadin for LV thrombus and Plavix for at least one year given placement of DES. Continue statin and beta blocker.  02/2017 echo Study Conclusions  - Left ventricle: The cavity size was normal. Wall thickness was increased in a pattern of mild LVH. Systolic function was moderately reduced. The estimated ejection fraction was in the range of 35% to 40%. No  definitive thrombus seen with contrast enhancement. Diffuse hypokinesis. Diastolic dysfunction with restrictive physiology. - Regional wall motion abnormality: Akinesis of the mid anterior and basal-mid anteroseptal myocardium. - Right ventricle: Systolic function was reduced.   Jan 2020 nuclear stress  No diagnostic ST segment changes to indicate ischemia.  Large, dense, anterior and anteroseptal defect extending from apex to base consistent with infarct scar.  This is a high risk study.  Nuclear stress EF: 22%.  Consistent with ischemic cardiomyopathy and large anterior/anteroseptal infarct scar, no significant ischemia.    Assessment and Plan   1. CAD/ICM/chronic systolic HF -medical therapy limited by orthostatic symptomspreviously. Fatigue on higher coreg dosing. - will try again to start aldactone, start 12.5mg  daily with BMET in 2 weeks - with recent SOB obtain echo - atypical chest pain, if recurrent would have trial of antacid as sounds more consistent with GI etiology.   - would need to d/c imdur since taking prn sildenafil  2. HTN - he is at goal, continue current meds   F/u 2 months  Antoine Poche, M.D.

## 2019-06-16 NOTE — Telephone Encounter (Signed)
Returned pt call. appt set for 6/23 and pt verbalized understanding. Pt reports he has had periods of pelvic pain and questioned if he had UTI. Offered pt appt to drop off urine sample. Pt denied appt for urine drop of

## 2019-06-16 NOTE — Telephone Encounter (Signed)
-----   Message from [name] [name] sent at 06/15/2019  3:07 PM EDT ----- I spoke with pt and he states his bladder pain is pretty bad and he cant wait until July. He requests a nurse call him back to discuss. ----- Message ----- From: Ferdinand Lango, RN Sent: 06/15/2019  10:23 AM EDT To: [name] [name]  Unfortunately, we will not have anything and this isnt an urgent f/u. [name] should be opening [name] July Thursday. We should be able to get him on first Thursday   ----- Message ----- From: [name] [name] Sent: 06/12/2019   4:02 PM EDT To: Ferdinand Lango, RN  Pt called to schedule UDS f/u, states he is having it this Wednesday. Wants a f/u within the next 2 weeks or so. Please advise.

## 2019-06-22 ENCOUNTER — Other Ambulatory Visit: Payer: Self-pay

## 2019-06-22 ENCOUNTER — Ambulatory Visit (INDEPENDENT_AMBULATORY_CARE_PROVIDER_SITE_OTHER): Payer: Medicare Other

## 2019-06-22 DIAGNOSIS — R0602 Shortness of breath: Secondary | ICD-10-CM

## 2019-06-28 ENCOUNTER — Telehealth: Payer: Self-pay | Admitting: Cardiology

## 2019-06-28 NOTE — Telephone Encounter (Signed)
Echo done on 6/3

## 2019-06-28 NOTE — Telephone Encounter (Signed)
Patient called requesting recent test results. 

## 2019-06-29 NOTE — Telephone Encounter (Signed)
Just resulted   J Christon Parada MD 

## 2019-06-29 NOTE — Telephone Encounter (Signed)
Pt voiced understanding - routed to pcp  

## 2019-06-29 NOTE — Telephone Encounter (Signed)
RETURNING CALL  °

## 2019-06-29 NOTE — Telephone Encounter (Signed)
Heart function remains moderately decreased right around 35% (normal is 50-60%), similar to prior echo    J BrancH MD

## 2019-06-30 ENCOUNTER — Other Ambulatory Visit (HOSPITAL_COMMUNITY)
Admission: RE | Admit: 2019-06-30 | Discharge: 2019-06-30 | Disposition: A | Discharge status: Home / Self Care | Payer: Medicare Other | Source: Ambulatory Visit | Attending: Cardiology | Admitting: Cardiology

## 2019-06-30 DIAGNOSIS — I5022 Chronic systolic (congestive) heart failure: Secondary | ICD-10-CM | POA: Insufficient documentation

## 2019-06-30 LAB — BASIC METABOLIC PANEL
Anion gap: 10 (ref 5–15)
BUN: 21 mg/dL — ABNORMAL HIGH (ref 6–20)
CO2: 22 mmol/L (ref 22–32)
Calcium: 9.1 mg/dL (ref 8.9–10.3)
Chloride: 106 mmol/L (ref 98–111)
Creatinine, Ser: 0.93 mg/dL (ref 0.61–1.24)
GFR calc Af Amer: >60 mL/min (ref >60)
GFR calc non Af Amer: >60 mL/min (ref >60)
Glucose, Bld: 144 mg/dL — ABNORMAL HIGH (ref 70–99)
Potassium: 4.1 mmol/L (ref 3.5–5.1)
Sodium: 138 mmol/L (ref 135–145)

## 2019-07-03 ENCOUNTER — Telehealth: Payer: Self-pay | Admitting: Urology

## 2019-07-03 NOTE — Telephone Encounter (Signed)
Requests a nurse return his call regarding medication issue.

## 2019-07-03 NOTE — Telephone Encounter (Signed)
Attempted to call pt back. Pt answered and hung the phone up

## 2019-07-04 NOTE — Telephone Encounter (Signed)
Pt wishes to be seen for re-evaluation. Appt scheduled for next Tuesday.

## 2019-07-05 ENCOUNTER — Telehealth: Payer: Self-pay | Admitting: Cardiology

## 2019-07-05 NOTE — Telephone Encounter (Signed)
Calling for lab results. °

## 2019-07-06 NOTE — Telephone Encounter (Signed)
Pt aware.

## 2019-07-06 NOTE — Telephone Encounter (Signed)
Normal labs   J Branch MD  

## 2019-07-06 NOTE — Telephone Encounter (Signed)
Just resulted  J Sava Proby MD 

## 2019-07-12 ENCOUNTER — Other Ambulatory Visit: Payer: Self-pay | Admitting: Urology

## 2019-07-12 ENCOUNTER — Other Ambulatory Visit: Payer: Self-pay

## 2019-07-12 ENCOUNTER — Encounter: Payer: Self-pay | Admitting: Urology

## 2019-07-12 ENCOUNTER — Ambulatory Visit (INDEPENDENT_AMBULATORY_CARE_PROVIDER_SITE_OTHER): Payer: Medicare Other | Admitting: Urology

## 2019-07-12 VITALS — BP 131/83 | HR 94 | Temp 98.7°F | Ht 70.0 in | Wt 230.0 lb

## 2019-07-12 DIAGNOSIS — N401 Enlarged prostate with lower urinary tract symptoms: Secondary | ICD-10-CM | POA: Diagnosis not present

## 2019-07-12 DIAGNOSIS — N138 Other obstructive and reflux uropathy: Secondary | ICD-10-CM | POA: Diagnosis not present

## 2019-07-12 DIAGNOSIS — R319 Hematuria, unspecified: Secondary | ICD-10-CM | POA: Diagnosis not present

## 2019-07-12 DIAGNOSIS — N390 Urinary tract infection, site not specified: Secondary | ICD-10-CM | POA: Diagnosis not present

## 2019-07-12 LAB — POCT URINALYSIS DIPSTICK
Bilirubin, UA: NEGATIVE
Blood, UA: NEGATIVE
Glucose, UA: POSITIVE — AB
Ketones, UA: NEGATIVE
Leukocytes, UA: NEGATIVE
Nitrite, UA: NEGATIVE
Protein, UA: NEGATIVE
Spec Grav, UA: 1.01 (ref 1.010–1.025)
Urobilinogen, UA: 0.2 E.U./dL
pH, UA: 5 (ref 5.0–8.0)

## 2019-07-12 MED ORDER — SILODOSIN 8 MG PO CAPS: 8.0000 mg | ORAL_CAPSULE | Freq: Every day | ORAL | 3 refills | Status: DC

## 2019-07-12 NOTE — Progress Notes (Signed)
07/12/2019 10:22 AM   [name] [name]. 05/23/1965 [id number]  Referring provider: Rebekah Chesterfield, NP [address] [place],  [place] [address]  Incomplete emptying  HPI: [name] is a 53yo here for followuop for BPH with incomplete emptying. He underwent UDS and his images were reviewed and discussed with the patient. He had a capacity of 1000cc. Delayed desires. Flow was 7-4ml/sec. Detrusor pressure 89cm H2O. He is currently on uroxatral and flomax. Stream is improving. Nocturia 2-4x.    PMH: Past Medical History:  Diagnosis Date  . Anxiety   . Arthritis    in lower back  . Coronary artery disease    a. 02/2008: anterior STEMI s/p DES to prox LAD, b. Myoview 2012: scar in the anterior wall with mild ischemia and EF 33%, c. neg GXT 06/2011  d. 08/2016: cath with DES to diagonal   . Depression with anxiety   . Diabetes mellitus    TYPE II  . Fatty liver    H/O ELEVATED LIVER ENZYMES  . High cholesterol   . Hyperlipidemia   . Hypertension   . Ischemic cardiomyopathy    a. Previous EF 33-35% 2012, b. improved to 45-50% by echo 06/2011  . LV (left ventricular) mural thrombus without MI (HCC)   . Noncompliance    Prior hx of med noncompliance  . OSA (obstructive sleep apnea)   . Sleep apnea     Surgical History: Past Surgical History:  Procedure Laterality Date  . CARDIAC CATHETERIZATION    . CORONARY STENT INTERVENTION N/A 08/21/2016   Procedure: CORONARY STENT INTERVENTION;  Surgeon: Kathleene Hazel, MD;  [facility]: [facility];  Service: Cardiovascular;  Laterality: N/A;  . INTRAVASCULAR PRESSURE WIRE/FFR STUDY N/A 08/21/2016   Procedure: INTRAVASCULAR PRESSURE WIRE/FFR STUDY;  Surgeon: Kathleene Hazel, MD;  Location: [facility] IN[facility]AS[facility] CV [facility];  Service: Cardiovascular;  Laterality: N/A;  . LIVER BIOPSY    . MI WITH STENTS    . NASAL SEPTOPLASTY W/ TURBINOPLASTY N/A 01/25/2019   Procedure: BILATERAL TURBINATE REDUCTION;  Surgeon: Newman Pies, MD;  Location: [facility]  OR;  Service: ENT;  Laterality: N/A;  . NASAL SINUS SURGERY    . RIGHT/LEFT HEART CATH AND CORONARY ANGIOGRAPHY N/A 08/21/2016   Procedure: Right/Left Heart Cath and Coronary Angiography;  Surgeon: Kathleene Hazel, MD;  Location[facility] INVASIVE CV LAB;  Service: Cardiovascular;  Laterality: N/A;    Home Medications:  Allergies as of 07/12/2019   No Known Allergies     Medication List       Accurate as of July 12, 2019 10:22 AM. If you have any questions, ask your nurse or doctor.        alfuzosin 10 MG 24 hr tablet Commonly known as: UROXATRAL Take 1 tablet (10 mg total) by mouth at bedtime.   aspirin EC 81 MG tablet Take 81 mg by mouth daily.   carvedilol 12.5 MG tablet Commonly known as: COREG Take 1.5 tablets (18.75 mg total) by mouth 2 (two) times daily with a meal.   ciprofloxacin 500 MG tablet Commonly known as: CIPRO Take 1 tablet (500 mg total) by mouth every 12 (twelve) hours.   glipiZIDE 10 MG 24 hr tablet Commonly known as: GLUCOTROL XL Take 10 mg by mouth daily.   Invokana 300 MG Tabs tablet Generic drug: canagliflozin Take 300 mg by mouth daily before breakfast.   Jardiance 25 MG Tabs tablet Generic drug: empagliflozin Take 25 mg by mouth daily.  lisinopril 10 MG tablet Commonly known as: ZESTRIL Take 1 tablet (10 mg total) by mouth daily.   Myrbetriq 25 MG Tb24 tablet Generic drug: mirabegron ER Take 25 mg by mouth daily as needed. Bladder spasms   nitroGLYCERIN 0.4 MG SL tablet Commonly known as: NITROSTAT Place 0.4 mg under the tongue every 5 (five) minutes as needed for chest pain.   Rybelsus 3 MG Tabs Generic drug: Semaglutide Take 3 mg by mouth daily.   sildenafil 20 MG tablet Commonly known as: REVATIO Take 20 mg by mouth daily as needed.   simvastatin 40 MG tablet Commonly known as: ZOCOR Take 1 tablet (40 mg total) by mouth at bedtime.   spironolactone 25 MG tablet Commonly known as: ALDACTONE Take 0.5 tablets (12.5 mg  total) by mouth daily.   tadalafil 20 MG tablet Commonly known as: CIALIS Take 1 tablet (20 mg total) by mouth daily as needed for erectile dysfunction.   tamsulosin 0.4 MG Caps capsule Commonly known as: FLOMAX Take 0.8 mg by mouth daily as needed. Bladder spasms       Allergies: No Known Allergies  Family History: Family History  Problem Relation Age of Onset  . Coronary artery disease Other        UNKNOWN  . Heart disease Father        CAD s/p CABG  . Diabetes Mellitus II Father   . Diabetes Mellitus II Mother   . Cancer Brother     Social History:  reports that he has never smoked. He has never used smokeless tobacco. He reports that he does not drink alcohol and does not use drugs.  ROS: All other review of systems were reviewed and are negative except what is noted above in HPI  Physical Exam: BP 131/83   Pulse 94   Temp 98.7 F (37.1 C)   Ht 5\' 10"  (1.778 m)   Wt 230 lb (104.3 kg)   BMI 33.00 kg/m   Constitutional:  Alert and oriented, No acute distress. HEENT: [place] AT, moist mucus membranes.  Trachea midline, no masses. Cardiovascular: No clubbing, cyanosis, or edema. Respiratory: Normal respiratory effort, no increased work of breathing. GI: Abdomen is soft, nontender, nondistended, no abdominal masses GU: No CVA tenderness.  Lymph: No cervical or inguinal lymphadenopathy. Skin: No rashes, bruises or suspicious lesions. Neurologic: Grossly intact, no focal deficits, moving all 4 extremities. Psychiatric: Normal mood and affect.  Laboratory Data: Lab Results  Component Value Date   WBC 7.3 01/18/2019   HGB 16.6 01/18/2019   HCT 49.6 01/18/2019   MCV 88.7 01/18/2019   PLT 287 01/18/2019    Lab Results  Component Value Date   CREATININE 0.93 06/30/2019    No results found for: PSA  No results found for: TESTOSTERONE  Lab Results  Component Value Date   HGBA1C 7.8 (H) 01/18/2019    Urinalysis    Component Value Date/Time   COLORURINE  AMBER (A) 05/19/2019 1600   APPEARANCEUR HAZY (A) 05/19/2019 1600   LABSPEC 1.031 (H) 05/19/2019 1600   PHURINE 6.0 05/19/2019 1600   GLUCOSEU >=500 (A) 05/19/2019 1600   HGBUR LARGE (A) 05/19/2019 1600   BILIRUBINUR neg 07/12/2019 1002   KETONESUR 20 (A) 05/19/2019 1600   PROTEINUR Negative 07/12/2019 1002   PROTEINUR >=300 (A) 05/19/2019 1600   UROBILINOGEN 0.2 07/12/2019 1002   NITRITE neg 07/12/2019 1002   NITRITE NEGATIVE 05/19/2019 1600   LEUKOCYTESUR Negative 07/12/2019 1002   LEUKOCYTESUR NEGATIVE 05/19/2019 1600  Lab Results  Component Value Date   BACTERIA FEW (A) 05/19/2019    Pertinent Imaging:  No results found for this or any previous visit.  No results found for this or any previous visit.  No results found for this or any previous visit.  No results found for this or any previous visit.  No results found for this or any previous visit.  No results found for this or any previous visit.  No results found for this or any previous visit.  No results found for this or any previous visit.   Assessment & Plan:    1. BPH with LUTS, incomplete emptying -start rapaflo in place of flomax -continue uroxatral -RTC 3 months with PVR -If his LUTS are unimproved we will proceed with cystoscopy - POCT urinalysis dipstick   No follow-ups on file.  Nicolette Bang, MD  [facility] [place]

## 2019-07-12 NOTE — Progress Notes (Signed)
Urological Symptom Review  Patient is experiencing the following symptoms: Frequent urination Burning/pain with urination Get up at night to urinate Leakage of urine Trouble starting stream Have to strain to urinate Weak stream Erection problems (male only)   Review of Systems  Gastrointestinal (upper)  : Indigestion/heartburn  Gastrointestinal (lower) : Constipation  Constitutional : Negative for symptoms  Skin: Skin rash/lesion  Eyes: Blurred vision  Ear/Nose/Throat : Sinus problems  Hematologic/Lymphatic: Negative for Hematologic/Lymphatic symptoms  Cardiovascular : Negative for cardiovascular symptoms  Respiratory : Shortness of breath  Endocrine: Excessive thirst  Musculoskeletal: Back pain Joint pain  Neurological: Headaches  Psychologic: Depression Anxiety

## 2019-07-13 LAB — PSA: PSA: 0.5 ng/mL (ref <=4.0)

## 2019-07-25 ENCOUNTER — Other Ambulatory Visit: Payer: Self-pay | Admitting: Urology

## 2019-07-25 DIAGNOSIS — N138 Other obstructive and reflux uropathy: Secondary | ICD-10-CM

## 2019-07-25 DIAGNOSIS — N401 Enlarged prostate with lower urinary tract symptoms: Secondary | ICD-10-CM

## 2019-08-17 ENCOUNTER — Encounter: Payer: Self-pay | Admitting: Cardiology

## 2019-08-17 ENCOUNTER — Ambulatory Visit (INDEPENDENT_AMBULATORY_CARE_PROVIDER_SITE_OTHER): Payer: Medicare Other | Admitting: Cardiology

## 2019-08-17 VITALS — BP 138/92 | HR 84 | Ht 70.0 in | Wt 239.8 lb

## 2019-08-17 DIAGNOSIS — I2510 Atherosclerotic heart disease of native coronary artery without angina pectoris: Secondary | ICD-10-CM

## 2019-08-17 DIAGNOSIS — I5022 Chronic systolic (congestive) heart failure: Secondary | ICD-10-CM | POA: Diagnosis not present

## 2019-08-17 MED ORDER — SACUBITRIL-VALSARTAN 24-26 MG PO TABS: 1.0000 | ORAL_TABLET | Freq: Two times a day (BID) | ORAL | 3 refills | Status: DC

## 2019-08-17 NOTE — Progress Notes (Signed)
Clinical Summary [name] [name] is a 54 y.o.male seen today for follow up of the following medical problems. This is a focused visit on his history of ICM and chronic systolic HF.   1. CAD/ICM/Chronic systolic HF - history of anterior STEMI 02/2008, received DES to LAD. LVEF at that time was 35% - 2012 myoview anterior scar with mild ischemia - 06/2011 echo LVEF 45-50% - 06/2011 GXT no ischemia - 07/2016 echo shows LVEF down to 30-35%, diffuse hypokinesis with akinesis of anerior and anteroseptal walls. Evidence of LV thrombus - with drop in LVEF and ongoing chest pain was referred for cath  08/2016 cath: see report below, received DES to D2 99%. Ok to stop ASA once coumadin is therapeutic per interventional cardiology, continue plavix.  11/2016 echo LVEF 35%, apical thrombus - 02/2017 echo LVEF 35-40%, restrictive, no clear thrombus  Jan 2020 nuclear stress: large anterior/anteroseptal scar, LVEF 22%.    06/2019 LVEF 35%, grade I DDx - last visit started aldactone 12.5mg  daily - some ongoing SOB, particularly out in the heat, overall stable - no recent edema - weight up 9 lbs since June visit - pays out of pocket meds     SH: he is on disability       Past Medical History:  Diagnosis Date  . Anxiety   . Arthritis    in lower back  . Coronary artery disease    a. 02/2008: anterior STEMI s/p DES to prox LAD, b. Myoview 2012: scar in the anterior wall with mild ischemia and EF 33%, c. neg GXT 06/2011  d. 08/2016: cath with DES to diagonal   . Depression with anxiety   . Diabetes mellitus    TYPE II  . Fatty liver    H/O ELEVATED LIVER ENZYMES  . High cholesterol   . Hyperlipidemia   . Hypertension   . Ischemic cardiomyopathy    a. Previous EF 33-35% 2012, b. improved to 45-50% by echo 06/2011  . LV (left ventricular) mural thrombus without MI (HCC)   . Noncompliance    Prior hx of med noncompliance  . OSA (obstructive sleep apnea)   . Sleep apnea      No  Known Allergies   Current Outpatient Medications  Medication Sig Dispense Refill  . alfuzosin (UROXATRAL) 10 MG 24 hr tablet TAKE 1 TABLET BY MOUTH AT BEDTIME 30 tablet 0  . aspirin EC 81 MG tablet Take 81 mg by mouth daily.    . canagliflozin (INVOKANA) 300 MG TABS tablet Take 300 mg by mouth daily before breakfast.    . carvedilol (COREG) 12.5 MG tablet Take 1.5 tablets (18.75 mg total) by mouth 2 (two) times daily with a meal. 270 tablet 1  . ciprofloxacin (CIPRO) 500 MG tablet Take 1 tablet (500 mg total) by mouth every 12 (twelve) hours. 14 tablet 0  . glipiZIDE (GLUCOTROL XL) 10 MG 24 hr tablet Take 10 mg by mouth daily.    Marland Kitchen JARDIANCE 25 MG TABS tablet Take 25 mg by mouth daily.    Marland Kitchen lisinopril (ZESTRIL) 10 MG tablet Take 1 tablet (10 mg total) by mouth daily. 90 tablet 1  . MYRBETRIQ 25 MG TB24 tablet Take 25 mg by mouth daily as needed. Bladder spasms    . nitroGLYCERIN (NITROSTAT) 0.4 MG SL tablet Place 0.4 mg under the tongue every 5 (five) minutes as needed for chest pain.    . Semaglutide (RYBELSUS) 3 MG TABS Take 3 mg by mouth daily.    Marland Kitchen  sildenafil (REVATIO) 20 MG tablet Take 20 mg by mouth daily as needed.     . silodosin (RAPAFLO) 8 MG CAPS capsule Take 1 capsule (8 mg total) by mouth daily with breakfast. 90 capsule 3  . simvastatin (ZOCOR) 40 MG tablet Take 1 tablet (40 mg total) by mouth at bedtime. 90 tablet 1  . spironolactone (ALDACTONE) 25 MG tablet Take 0.5 tablets (12.5 mg total) by mouth daily. 45 tablet 1  . tadalafil (CIALIS) 20 MG tablet Take 1 tablet (20 mg total) by mouth daily as needed for erectile dysfunction. 10 tablet 5  . tamsulosin (FLOMAX) 0.4 MG CAPS capsule Take 0.8 mg by mouth daily as needed. Bladder spasms     No current facility-administered medications for this visit.     Past Surgical History:  Procedure Laterality Date  . CARDIAC CATHETERIZATION    . CORONARY STENT INTERVENTION N/A 08/21/2016   Procedure: CORONARY STENT INTERVENTION;   Surgeon: Kathleene Hazel, MD;  Location: [facility] INVASIVE CV LAB;  Service: Cardiovascular;  Laterality: N/A;  . INTRAVASCULAR PRESSURE WIRE/FFR STUDY N/A 08/21/2016   Procedure: INTRAVASCULAR PRESSURE WIRE/FFR STUDY;  Surgeon: Kathleene Hazel, MD;  Location: [facility] INVASIVE CV LAB;  Service: Cardiovascular;  Laterality: N/A;  . LIVER BIOPSY    . MI WITH STENTS    . NASAL SEPTOPLASTY W/ TURBINOPLASTY N/A 01/25/2019   Procedure: BILATERAL TURBINATE REDUCTION;  Surgeon: Newman Pies, MD;  Location: [facility] OR;  Service: ENT;  Laterality: N/A;  . NASAL SINUS SURGERY    . RIGHT/LEFT HEART CATH AND CORONARY ANGIOGRAPHY N/A 08/21/2016   Procedure: Right/Left Heart Cath and Coronary Angiography;  Surgeon: Kathleene Hazel, MD;  Location[facility] INVASIVE CV LAB;  Service: Cardiovascular;  Laterality: N/A;     No Known Allergies    Family History  Problem Relation Age of Onset  . Coronary artery disease Other        UNKNOWN  . Heart disease Father        CAD s/p CABG  . Diabetes Mellitus II Father   . Diabetes Mellitus II Mother   . Cancer Brother      Social History [name] [name] reports that he has never smoked. He has never used smokeless tobacco. [name] [name] reports no history of alcohol use.   Review of Systems CONSTITUTIONAL: No weight loss, fever, chills, weakness or fatigue.  HEENT: Eyes: No visual loss, blurred vision, double vision or yellow sclerae.No hearing loss, sneezing, congestion, runny nose or sore throat.  SKIN: No rash or itching.  CARDIOVASCULAR: per hpi RESPIRATORY: No shortness of breath, cough or sputum.  GASTROINTESTINAL: No anorexia, nausea, vomiting or diarrhea. No abdominal pain or blood.  GENITOURINARY: No burning on urination, no polyuria NEUROLOGICAL: No headache, dizziness, syncope, paralysis, ataxia, numbness or tingling in the extremities. No change in bowel or bladder control.  MUSCULOSKELETAL: No muscle, back pain, joint pain or stiffness.  LYMPHATICS: No  enlarged nodes. No history of splenectomy.  PSYCHIATRIC: No history of depression or anxiety.  ENDOCRINOLOGIC: No reports of sweating, cold or heat intolerance. No polyuria or polydipsia.  Marland Kitchen   Physical Examination Today's Vitals   08/17/19 1109  BP: (!) 138/92  Pulse: 84  SpO2: 97%  Weight: (!) 239 lb 12.8 oz (108.8 kg)  Height: 5\' 10"  (1.778 m)   Body mass index is 34.41 kg/m.  Gen: resting comfortably, no acute distress HEENT: no scleral icterus, pupils equal round and reactive, no palptable cervical adenopathy,  CV: RRR, no m/r/g, no  jvd Resp: Clear to auscultation bilaterally GI: abdomen is soft, non-tender, non-distended, normal bowel sounds, no hepatosplenomegaly MSK: extremities are warm, no edema.  Skin: warm, no rash Neuro:  no focal deficits Psych: appropriate affect   Diagnostic Studies 6./2013 echo Study Conclusions  - Left ventricle: Systolic function was mildly reduced. The estimated ejection fraction was in the range of 45% to 50%. - Left atrium: The atrium was mildly dilated.  08/07/16 echo Study Conclusions  - Left ventricle: LV systolic function is depressed at approximately 30 to 35% with severe hypokinesis/akinesis of the inferior, septal, distal lateral and apical walls. Cannot exclude mural thrombus at apex. Suggest limited echo with Definity to define The cavity size was normal. Wall thickness was normal. - Mitral valve: There was mild regurgitation. - Left atrium: The atrium was mildly dilated.  08/14/16 echo Study Conclusions  - Procedure narrative: Transthoracic echocardiography. Image quality was adequate. The study was technically difficult. Intravenous contrast (Definity) was administered. - Left ventricle: Systolic function was moderately to severely reduced. The estimated ejection fraction was in the range of 30% to 35%. Diffuse hypokinesis. There is akinesis of the anteroseptal, anterior, and apical  myocardium. - Right ventricle: Systolic function was normal.  Impressions:  - Limited study with Definity contrast. LVEF is in the range of 30-35%. There is a loosely organized LV apical thrombus noted. Diffuse hypokinesis overall with akinesis of the anterior, anteroseptal, and apical myocardium. RV contraction normal based on limited views.  08/2016 cath  Mid RCA-2 lesion, 30 %stenosed.  Mid RCA-1 lesion, 30 %stenosed.  Ost Ramus to Ramus lesion, 20 %stenosed.  Ost LAD to Prox LAD lesion, 10 %stenosed.  Prox LAD to Mid LAD lesion, 60 %stenosed.  A STENT SYNERGY DES 2.25X12 drug eluting stent was successfully placed.  2nd Diag lesion, 99 %stenosed.  Post intervention, there is a 0% residual stenosis.  1. Patent stent proximal LAD 2. Moderate stenosis mid LAD. FFR was 0.84 suggesting the stenosis was not flow limiting.  3. The Diagonal Tennis Mckinnon is a moderate caliber vessel with a 99% stenosis.  4. Successful PTCA/DES x 1 Diagonal Norvin Ohlin 5. Mild disease RCA  Recommendations: Will continue ASA and Plavix for now. He will need to be started on coumadin tomorrow before discharge for his LV thrombus. He can stop ASA when his INR is therapeutic on coumadin since he was not an ACS. I would plan long term coumadin for LV thrombus and Plavix for at least one year given placement of DES. Continue statin and beta blocker.  02/2017 echo Study Conclusions  - Left ventricle: The cavity size was normal. Wall thickness was increased in a pattern of mild LVH. Systolic function was moderately reduced. The estimated ejection fraction was in the range of 35% to 40%. No definitive thrombus seen with contrast enhancement. Diffuse hypokinesis. Diastolic dysfunction with restrictive physiology. - Regional wall motion abnormality: Akinesis of the mid anterior and basal-mid anteroseptal myocardium. - Right ventricle: Systolic function was reduced.   Jan 2020 nuclear  stress  No diagnostic ST segment changes to indicate ischemia.  Large, dense, anterior and anteroseptal defect extending from apex to base consistent with infarct scar.  This is a high risk study.  Nuclear stress EF: 22%. Consistent with ischemic cardiomyopathy and large anterior/anteroseptal infarct scar, no significant ischemia.    Assessment and Plan   1. CAD/ICM/chronic systolic HF - Fatigue on higher coreg dosing.Prior issues with orthostatic symptoms had limited medical therapy but these have resolved - will d/c lisinopril, wait  48 hrs and start entresto 24/26mg  bid    F/u 2 months for further medication titration   Antoine Poche, M.D.

## 2019-08-17 NOTE — Patient Instructions (Signed)
Medication Instructions:  Your physician has recommended you make the following change in your medication:  -- STOP Lisinopril -- START Entresto 24/26 - Take 1 tablet by mouth twice daily -- DO NOT START UNTIL 48 HOURS after you have STOPPED TAKING Lisinopril. *If you need a refill on your cardiac medications before your next appointment, please call your pharmacy*  Lab Work: If you have labs (blood work) drawn today and your tests are completely normal, you will receive your results only by: Marland Kitchen MyChart Message (if you have MyChart) OR . A paper copy in the mail If you have any lab test that is abnormal or we need to change your treatment, we will call you to review the results.  Follow-Up: At [facility], you and your health needs are our priority.  As part of our continuing mission to provide you with exceptional heart care, we have created designated Provider Care Teams.  These Care Teams include your primary Cardiologist (physician) and Advanced Practice Providers (APPs -  Physician Assistants and Nurse Practitioners) who all work together to provide you with the care you need, when you need it.  We recommend signing up for the patient portal called "MyChart".  Sign up information is provided on this After Visit Summary.  MyChart is used to connect with patients for Virtual Visits (Telemedicine).  Patients are able to view lab/test results, encounter notes, upcoming appointments, etc.  Non-urgent messages can be sent to your provider as well.   To learn more about what you can do with MyChart, go to [email or web address].    Your next appointment:   Your physician recommends that you schedule a follow-up appointment in: 2 MONTHS with Dr. Wyline Mood  The format for your next appointment:   In Person with [name], MD

## 2019-09-29 ENCOUNTER — Encounter: Payer: Self-pay | Admitting: Urology

## 2019-09-29 ENCOUNTER — Other Ambulatory Visit: Payer: Self-pay

## 2019-09-29 ENCOUNTER — Ambulatory Visit (INDEPENDENT_AMBULATORY_CARE_PROVIDER_SITE_OTHER): Payer: Medicare Other | Admitting: Urology

## 2019-09-29 VITALS — BP 126/80 | HR 76 | Temp 97.4°F | Ht 70.0 in | Wt 239.8 lb

## 2019-09-29 DIAGNOSIS — N138 Other obstructive and reflux uropathy: Secondary | ICD-10-CM | POA: Diagnosis not present

## 2019-09-29 DIAGNOSIS — R339 Retention of urine, unspecified: Secondary | ICD-10-CM | POA: Diagnosis not present

## 2019-09-29 DIAGNOSIS — R319 Hematuria, unspecified: Secondary | ICD-10-CM

## 2019-09-29 DIAGNOSIS — N390 Urinary tract infection, site not specified: Secondary | ICD-10-CM | POA: Diagnosis not present

## 2019-09-29 DIAGNOSIS — N401 Enlarged prostate with lower urinary tract symptoms: Secondary | ICD-10-CM

## 2019-09-29 LAB — POCT URINALYSIS DIPSTICK
Bilirubin, UA: NEGATIVE
Blood, UA: NEGATIVE
Glucose, UA: POSITIVE — AB
Ketones, UA: NEGATIVE
Leukocytes, UA: NEGATIVE
Nitrite, UA: NEGATIVE
Protein, UA: NEGATIVE
Spec Grav, UA: 1.025 (ref 1.010–1.025)
Urobilinogen, UA: 0.2 E.U./dL
pH, UA: 5 (ref 5.0–8.0)

## 2019-09-29 LAB — BLADDER SCAN AMB NON-IMAGING: Scan Result: 329

## 2019-09-29 MED ORDER — SILODOSIN 8 MG PO CAPS: 8.0000 mg | ORAL_CAPSULE | Freq: Every day | ORAL | 3 refills | Status: DC

## 2019-09-29 MED ORDER — SULFAMETHOXAZOLE-TRIMETHOPRIM 800-160 MG PO TABS: 1.0000 | ORAL_TABLET | Freq: Two times a day (BID) | ORAL | 0 refills | Status: DC

## 2019-09-29 NOTE — Patient Instructions (Signed)

## 2019-09-29 NOTE — Progress Notes (Signed)
Urological Symptom Review  Patient is experiencing the following symptoms: Frequent urination Hard to postpone urination Get up at night to urinate Leakage of urine Erection problems (male only) Penile pain (male only)    Review of Systems  Gastrointestinal (upper)  : Indigestion/heartburn  Gastrointestinal (lower) : Constipation  Constitutional : Fatigue  Skin: Itching  Eyes: Blurred vision  Ear/Nose/Throat : Negative for Ear/Nose/Throat symptoms  Hematologic/Lymphatic: Negative for Hematologic/Lymphatic symptoms  Cardiovascular : Chest pain  Respiratory : Shortness of breath  Endocrine: Excessive thirst  Musculoskeletal: Back pain Joint pain  Neurological: Headaches  Psychologic: Depression Anxiety

## 2019-09-29 NOTE — Progress Notes (Signed)
09/29/2019 9:37 AM   [name] [name]. [birth year] [id number]  Referring provider: Rebekah Chesterfield, NP [address] [place] [address] [place],  [place] [address]  followup BPH  HPI: [name] [name] is a 53yo here followup for BPH with incomplete emptying. He is currently on rapaflo and notes improvement in his stream and nocturia. 2 weeks ago he developed new perineal pain, intermittent dysuria. He has new pain with ejaculation.  He has erectile dysfunction and takes tadalafil with mixed results     PMH: Past Medical History:  Diagnosis Date   Anxiety    Arthritis    in lower back   Coronary artery disease    a. 02/2008: anterior STEMI s/p DES to prox LAD, b. Myoview 2012: scar in the anterior wall with mild ischemia and EF 33%, c. neg GXT 06/2011  d. 08/2016: cath with DES to diagonal    Depression with anxiety    Diabetes mellitus    TYPE II   Fatty liver    H/O ELEVATED LIVER ENZYMES   High cholesterol    Hyperlipidemia    Hypertension    Ischemic cardiomyopathy    a. Previous EF 33-35% 2012, b. 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)    Noncompliance    Prior hx of med noncompliance   OSA (obstructive sleep apnea)    Sleep apnea     Surgical History: Past Surgical History:  Procedure Laterality Date   CARDIAC CATHETERIZATION     CORONARY STENT INTERVENTION N/A 08/21/2016   Procedure: CORONARY STENT INTERVENTION;  Surgeon: Kathleene Hazel, MD;  [facility];  Service: Cardiovascular;  Laterality: N/A;   INTRAVASCULAR PRESSURE WIRE/FFR STUDY N/A 08/21/2016   Procedure: INTRAVASCULAR PRESSURE WIRE/FFR STUDY;  Surgeon: Kathleene Hazel, MD;  [facility];  Service: Cardiovascular;  Laterality: N/A;   LIVER BIOPSY     MI WITH STENTS     NASAL SEPTOPLASTY W/ TURBINOPLASTY N/A 01/25/2019   Procedure: BILATERAL TURBINATE REDUCTION;  Surgeon: Newman Pies, MD;  [facility]: [facility];  Service:  ENT;  Laterality: N/A;   NASAL SINUS SURGERY     RIGHT/LEFT HEART CATH AND CORONARY ANGIOGRAPHY N/A 08/21/2016   Procedure: Right/Left Heart Cath and Coronary Angiography;  Surgeon: Kathleene Hazel, MD;  [facility];  Service: Cardiovascular;  Laterality: N/A;    Home Medications:  Allergies as of 09/29/2019   No Known Allergies     Medication List       Accurate as of September 29, 2019  9:37 AM. If you have any questions, ask your nurse or doctor.        alfuzosin 10 MG 24 hr tablet Commonly known as: UROXATRAL TAKE 1 TABLET BY MOUTH AT BEDTIME   aspirin EC 81 MG tablet Take 81 mg by mouth daily.   carvedilol 12.5 MG tablet Commonly known as: COREG Take 1.5 tablets (18.75 mg total) by mouth 2 (two) times daily with a meal.   escitalopram 10 MG tablet Commonly known as: LEXAPRO Take 10 mg by mouth daily.   glipiZIDE 10 MG 24 hr tablet Commonly known as: GLUCOTROL XL Take 10 mg by mouth daily.   Jardiance 25 MG Tabs tablet Generic drug: empagliflozin Take 25 mg by mouth daily.   nitroGLYCERIN 0.4 MG SL tablet Commonly known as: NITROSTAT Place 0.4 mg under the tongue every 5 (five) minutes as needed for chest pain.  Rybelsus 3 MG Tabs Generic drug: Semaglutide Take 3 mg by mouth daily.   sacubitril-valsartan 24-26 MG Commonly known as: ENTRESTO Take 1 tablet by mouth 2 (two) times daily.   sildenafil 20 MG tablet Commonly known as: REVATIO Take 20 mg by mouth daily as needed.   silodosin 8 MG Caps capsule Commonly known as: RAPAFLO Take 1 capsule (8 mg total) by mouth daily with breakfast.   simvastatin 40 MG tablet Commonly known as: ZOCOR Take 1 tablet (40 mg total) by mouth at bedtime.   spironolactone 25 MG tablet Commonly known as: ALDACTONE Take 0.5 tablets (12.5 mg total) by mouth daily.   tadalafil 20 MG tablet Commonly known as: CIALIS Take 1 tablet (20 mg total) by mouth daily as needed for erectile  dysfunction.       Allergies: No Known Allergies  Family History: Family History  Problem Relation Age of Onset   Coronary artery disease Other        UNKNOWN   Heart disease Father        CAD s/p CABG   Diabetes Mellitus II Father    Diabetes Mellitus II Mother    Cancer Brother     Social History:  reports that he has never smoked. He has never used smokeless tobacco. He reports that he does not drink alcohol and does not use drugs.  ROS: All other review of systems were reviewed and are negative except what is noted above in HPI  Physical Exam: BP 126/80    Pulse 76    Temp (!) 97.4 F (36.3 C)    Ht 5\' 10"  (1.778 m)    Wt 239 lb 12.8 oz (108.8 kg)    BMI 34.41 kg/m   Constitutional:  Alert and oriented, No acute distress. HEENT: [place] AT, moist mucus membranes.  Trachea midline, no masses. Cardiovascular: No clubbing, cyanosis, or edema. Respiratory: Normal respiratory effort, no increased work of breathing. GI: Abdomen is soft, nontender, nondistended, no abdominal masses GU: No CVA tenderness.  Lymph: No cervical or inguinal lymphadenopathy. Skin: No rashes, bruises or suspicious lesions. Neurologic: Grossly intact, no focal deficits, moving all 4 extremities. Psychiatric: Normal mood and affect.  Laboratory Data: Lab Results  Component Value Date   WBC 7.3 01/18/2019   HGB 16.6 01/18/2019   HCT 49.6 01/18/2019   MCV 88.7 01/18/2019   PLT 287 01/18/2019    Lab Results  Component Value Date   CREATININE 0.93 06/30/2019    Lab Results  Component Value Date   PSA 0.5 07/12/2019    No results found for: TESTOSTERONE  Lab Results  Component Value Date   HGBA1C 7.8 (H) 01/18/2019    Urinalysis    Component Value Date/Time   COLORURINE AMBER (A) 05/19/2019 1600   APPEARANCEUR HAZY (A) 05/19/2019 1600   LABSPEC 1.031 (H) 05/19/2019 1600   PHURINE 6.0 05/19/2019 1600   GLUCOSEU >=500 (A) 05/19/2019 1600   HGBUR LARGE (A) 05/19/2019 1600    BILIRUBINUR negative 09/29/2019 0910   KETONESUR 20 (A) 05/19/2019 1600   PROTEINUR Negative 09/29/2019 0910   PROTEINUR >=300 (A) 05/19/2019 1600   UROBILINOGEN 0.2 09/29/2019 0910   NITRITE negative 09/29/2019 0910   NITRITE NEGATIVE 05/19/2019 1600   LEUKOCYTESUR Negative 09/29/2019 0910   LEUKOCYTESUR NEGATIVE 05/19/2019 1600    Lab Results  Component Value Date   BACTERIA FEW (A) 05/19/2019    Pertinent Imaging:  No results found for this or any previous visit.  No results found  for this or any previous visit.  No results found for this or any previous visit.  No results found for this or any previous visit.  No results found for this or any previous visit.  No results found for this or any previous visit.  No results found for this or any previous visit.  No results found for this or any previous visit.   Assessment & Plan:    1. Benign prostatic hyperplasia with urinary obstruction -Continue rapaflo  2. Incomplete bladder emptying Continue rapaflo - BLADDER SCAN AMB NON-IMAGING  3. Chronic prostatitis Bactrim DS BID for 28 days - POCT urinalysis dipstick   No follow-ups on file.  Wilkie Aye, MD  [facility] [place]

## 2019-10-18 ENCOUNTER — Ambulatory Visit: Payer: Medicare Other | Admitting: Urology

## 2019-10-30 ENCOUNTER — Encounter: Payer: Self-pay | Admitting: Urology

## 2019-10-30 ENCOUNTER — Other Ambulatory Visit: Payer: Self-pay

## 2019-10-30 ENCOUNTER — Ambulatory Visit (INDEPENDENT_AMBULATORY_CARE_PROVIDER_SITE_OTHER): Payer: Medicare Other | Admitting: Urology

## 2019-10-30 ENCOUNTER — Telehealth: Payer: Self-pay | Admitting: Cardiology

## 2019-10-30 VITALS — BP 134/90 | HR 86 | Temp 99.0°F

## 2019-10-30 DIAGNOSIS — R339 Retention of urine, unspecified: Secondary | ICD-10-CM | POA: Diagnosis not present

## 2019-10-30 DIAGNOSIS — N138 Other obstructive and reflux uropathy: Secondary | ICD-10-CM | POA: Diagnosis not present

## 2019-10-30 DIAGNOSIS — N401 Enlarged prostate with lower urinary tract symptoms: Secondary | ICD-10-CM | POA: Diagnosis not present

## 2019-10-30 LAB — URINALYSIS, ROUTINE W REFLEX MICROSCOPIC
Bilirubin, UA: NEGATIVE
Glucose, UA: NEGATIVE
Leukocytes,UA: NEGATIVE
Nitrite, UA: NEGATIVE
Protein,UA: NEGATIVE
Specific Gravity, UA: 1.015 (ref 1.005–1.030)
Urobilinogen, Ur: 0.2 mg/dL (ref 0.2–1.0)
pH, UA: 7 (ref 5.0–7.5)

## 2019-10-30 LAB — BLADDER SCAN AMB NON-IMAGING: Scan Result: 555

## 2019-10-30 MED ORDER — CIPROFLOXACIN HCL 500 MG PO TABS
500.0000 mg | ORAL_TABLET | Freq: Once | ORAL | Status: AC
  Administered 2019-10-30: 500 mg via ORAL

## 2019-10-30 NOTE — Telephone Encounter (Signed)
  Patient Consent for Virtual Visit         [name] [name]. has provided verbal consent on 10/30/2019 for a virtual visit (video or telephone).   CONSENT FOR VIRTUAL VISIT FOR:  [name] [name].  By participating in this virtual visit I agree to the following:  I hereby voluntarily request, consent and authorize [facility] and its employed or contracted physicians, physician assistants, nurse practitioners or other licensed health care professionals (the Practitioner), to provide me with telemedicine health care services (the "Services") as deemed necessary by the treating Practitioner. I acknowledge and consent to receive the Services by the Practitioner via telemedicine. I understand that the telemedicine visit will involve communicating with the Practitioner through live audiovisual communication technology and the disclosure of certain medical information by electronic transmission. I acknowledge that I have been given the opportunity to request an in-person assessment or other available alternative prior to the telemedicine visit and am voluntarily participating in the telemedicine visit.  I understand that I have the right to withhold or withdraw my consent to the use of telemedicine in the course of my care at any time, without affecting my right to future care or treatment, and that the Practitioner or I may terminate the telemedicine visit at any time. I understand that I have the right to inspect all information obtained and/or recorded in the course of the telemedicine visit and may receive copies of available information for a reasonable fee.  I understand that some of the potential risks of receiving the Services via telemedicine include:  Marland Kitchen Delay or interruption in medical evaluation due to technological equipment failure or disruption; . Information transmitted may not be sufficient (e.g. poor resolution of images) to allow for appropriate medical decision making by the  Practitioner; and/or  . In rare instances, security protocols could fail, causing a breach of personal health information.  Furthermore, I acknowledge that it is my responsibility to provide information about my medical history, conditions and care that is complete and accurate to the best of my ability. I acknowledge that Practitioner's advice, recommendations, and/or decision may be based on factors not within their control, such as incomplete or inaccurate data provided by me or distortions of diagnostic images or specimens that may result from electronic transmissions. I understand that the practice of medicine is not an exact science and that Practitioner makes no warranties or guarantees regarding treatment outcomes. I acknowledge that a copy of this consent can be made available to me via my patient portal [facility] MyChart), or I can request a printed copy by calling the office of [facility].    I understand that my insurance will be billed for this visit.   I have read or had this consent read to me. . I understand the contents of this consent, which adequately explains the benefits and risks of the Services being provided via telemedicine.  . I have been provided ample opportunity to ask questions regarding this consent and the Services and have had my questions answered to my satisfaction. . I give my informed consent for the services to be provided through the use of telemedicine in my medical care

## 2019-10-30 NOTE — Progress Notes (Signed)
10/30/2019 9:18 AM   [name] [name]. [birth year] [id number]  Referring provider: Rebekah Chesterfield, NP [address] [place] [address] [place],  [place] [address]  Incomplete emptying  HPI: [name] is a 53yo here for followup fo incomplete emptying. His PVR is 555cc. He is on rapaflo 8mg  daily. He has nocturnal enuresis. He has urinary frequency every 30-60 minutes. He is unhappy with his medication regiment   PMH: Past Medical History:  Diagnosis Date   Anxiety    Arthritis    in lower back   Coronary artery disease    a. 02/2008: anterior STEMI s/p DES to prox LAD, b. Myoview 2012: scar in the anterior wall with mild ischemia and EF 33%, c. neg GXT 06/2011  d. 08/2016: cath with DES to diagonal    Depression with anxiety    Diabetes mellitus    TYPE II   Fatty liver    H/O ELEVATED LIVER ENZYMES   High cholesterol    Hyperlipidemia    Hypertension    Ischemic cardiomyopathy    a. Previous EF 33-35% 2012, b. 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)    Noncompliance    Prior hx of med noncompliance   OSA (obstructive sleep apnea)    Sleep apnea     Surgical History: Past Surgical History:  Procedure Laterality Date   CARDIAC CATHETERIZATION     CORONARY STENT INTERVENTION N/A 08/21/2016   Procedure: CORONARY STENT INTERVENTION;  Surgeon: 10/21/2016, MD;  [facility];  Service: Cardiovascular;  Laterality: N/A;   INTRAVASCULAR PRESSURE WIRE/FFR STUDY N/A 08/21/2016   Procedure: INTRAVASCULAR PRESSURE WIRE/FFR STUDY;  Surgeon: 10/21/2016, MD;  [facility];  Service: Cardiovascular;  Laterality: N/A;   LIVER BIOPSY     MI WITH STENTS     NASAL SEPTOPLASTY W/ TURBINOPLASTY N/A 01/25/2019   Procedure: BILATERAL TURBINATE REDUCTION;  Surgeon: 03/25/2019, MD;  [facility]: [facility];  Service: ENT;  Laterality: N/A;   NASAL SINUS SURGERY     RIGHT/LEFT HEART CATH AND CORONARY  ANGIOGRAPHY N/A 08/21/2016   Procedure: Right/Left Heart Cath and Coronary Angiography;  Surgeon: 10/21/2016, MD;  [facility];  Service: Cardiovascular;  Laterality: N/A;    Home Medications:  Allergies as of 10/30/2019   No Known Allergies     Medication List       Accurate as of October 30, 2019  9:18 AM. If you have any questions, ask your nurse or doctor.        alfuzosin 10 MG 24 hr tablet Commonly known as: UROXATRAL TAKE 1 TABLET BY MOUTH AT BEDTIME   aspirin EC 81 MG tablet Take 81 mg by mouth daily.   carvedilol 12.5 MG tablet Commonly known as: COREG Take 1.5 tablets (18.75 mg total) by mouth 2 (two) times daily with a meal.   escitalopram 10 MG tablet Commonly known as: LEXAPRO Take 10 mg by mouth daily.   glipiZIDE 10 MG 24 hr tablet Commonly known as: GLUCOTROL XL Take 10 mg by mouth daily.   Jardiance 25 MG Tabs tablet Generic drug: empagliflozin Take 25 mg by mouth daily.   nitroGLYCERIN 0.4 MG SL tablet Commonly known as: NITROSTAT Place 0.4 mg under the tongue every 5 (five) minutes as needed for chest pain.   Rybelsus 3 MG Tabs Generic drug: Semaglutide Take 3 mg by mouth daily.  sacubitril-valsartan 24-26 MG Commonly known as: ENTRESTO Take 1 tablet by mouth 2 (two) times daily.   sildenafil 20 MG tablet Commonly known as: REVATIO Take 20 mg by mouth daily as needed.   silodosin 8 MG Caps capsule Commonly known as: RAPAFLO Take 1 capsule (8 mg total) by mouth daily with breakfast.   simvastatin 40 MG tablet Commonly known as: ZOCOR Take 1 tablet (40 mg total) by mouth at bedtime.   spironolactone 25 MG tablet Commonly known as: ALDACTONE Take 0.5 tablets (12.5 mg total) by mouth daily.   sulfamethoxazole-trimethoprim 800-160 MG tablet Commonly known as: BACTRIM DS Take 1 tablet by mouth 2 (two) times daily.   tadalafil 20 MG tablet Commonly known as: CIALIS Take 1 tablet (20 mg total) by mouth  daily as needed for erectile dysfunction.       Allergies: No Known Allergies  Family History: Family History  Problem Relation Age of Onset   Coronary artery disease Other        UNKNOWN   Heart disease Father        CAD s/p CABG   Diabetes Mellitus II Father    Diabetes Mellitus II Mother    Cancer Brother     Social History:  reports that he has never smoked. He has never used smokeless tobacco. He reports that he does not drink alcohol and does not use drugs.  ROS: All other review of systems were reviewed and are negative except what is noted above in HPI  Physical Exam: BP 134/90    Pulse 86    Temp 99 F (37.2 C)   Constitutional:  Alert and oriented, No acute distress. HEENT: [place] AT, moist mucus membranes.  Trachea midline, no masses. Cardiovascular: No clubbing, cyanosis, or edema. Respiratory: Normal respiratory effort, no increased work of breathing. GI: Abdomen is soft, nontender, nondistended, no abdominal masses GU: No CVA tenderness.  Lymph: No cervical or inguinal lymphadenopathy. Skin: No rashes, bruises or suspicious lesions. Neurologic: Grossly intact, no focal deficits, moving all 4 extremities. Psychiatric: Normal mood and affect.  Laboratory Data: Lab Results  Component Value Date   WBC 7.3 01/18/2019   HGB 16.6 01/18/2019   HCT 49.6 01/18/2019   MCV 88.7 01/18/2019   PLT 287 01/18/2019    Lab Results  Component Value Date   CREATININE 0.93 06/30/2019    Lab Results  Component Value Date   PSA 0.5 07/12/2019    No results found for: TESTOSTERONE  Lab Results  Component Value Date   HGBA1C 7.8 (H) 01/18/2019    Urinalysis    Component Value Date/Time   COLORURINE AMBER (A) 05/19/2019 1600   APPEARANCEUR HAZY (A) 05/19/2019 1600   LABSPEC 1.031 (H) 05/19/2019 1600   PHURINE 6.0 05/19/2019 1600   GLUCOSEU >=500 (A) 05/19/2019 1600   HGBUR LARGE (A) 05/19/2019 1600   BILIRUBINUR negative 09/29/2019 0910   KETONESUR 20  (A) 05/19/2019 1600   PROTEINUR Negative 09/29/2019 0910   PROTEINUR >=300 (A) 05/19/2019 1600   UROBILINOGEN 0.2 09/29/2019 0910   NITRITE negative 09/29/2019 0910   NITRITE NEGATIVE 05/19/2019 1600   LEUKOCYTESUR Negative 09/29/2019 0910   LEUKOCYTESUR NEGATIVE 05/19/2019 1600    Lab Results  Component Value Date   BACTERIA FEW (A) 05/19/2019    Pertinent Imaging:  No results found for this or any previous visit.  No results found for this or any previous visit.  No results found for this or any previous visit.  No results found  for this or any previous visit.  No results found for this or any previous visit.  No results found for this or any previous visit.  No results found for this or any previous visit.  No results found for this or any previous visit.   Assessment & Plan:    1. Incomplete bladder emptying -continue medications pending TURP - BLADDER SCAN AMB NON-IMAGING - Urinalysis, Routine w reflex microscopic  2. Benign prostatic hyperplasia with urinary obstruction -We discussed the treatment options including medical therapy, TURP, Urolift, Rezum, and simple prostatectomy. AFter discussing the options the patient elects for TURP. Risks/benefits/alternatives dsicussed    Cystoscopy Procedure Note  Patient identification was confirmed, informed consent was obtained, and patient was prepped using Betadine solution.  Lidocaine jelly was administered per urethral meatus.     Pre-Procedure: - Inspection reveals a normal caliber ureteral meatus.  Procedure: The flexible cystoscope was introduced without difficulty - No urethral strictures/lesions are present. - Enlarged prostate enlarged median lobe - Elevated bladder neck - Bilateral ureteral orifices identified - Bladder mucosa  reveals no ulcers, tumors, or lesions - No bladder stones - No trabeculation  Retroflexion shows 2cm intravesical prostatic protrusion   Post-Procedure: - Patient  tolerated the procedure well   No follow-ups on file.  Wilkie Aye, MD  [facility] [place]

## 2019-10-30 NOTE — Patient Instructions (Signed)
Transurethral Resection of the Prostate Transurethral resection of the prostate (TURP) is the removal (resection) of part of the gland that produces semen (prostate gland). This procedure is done to treat benign prostatic hyperplasia (BPH). BPH is an abnormal, noncancerous (benign) increase in the number of cells that make up the prostate tissue. BPH causes the prostate to get bigger. The enlarged prostate can push against or block the tube that drains urine from the bladder out of the body (urethra). BPH can affect normal urine flow by causing bladder infections, difficulty controlling bladder function, and difficulty emptying the bladder.  The goal of TURP is to remove enough prostate tissue to allow for a normal flow of urine. The procedure will allow you to empty your bladder more completely when you urinate so that you can urinate less often. In a transurethral resection, a thin telescope with a light, a tiny camera, and an electric cutting edge (resectoscope) is passed through the urethra and into the prostate. The opening of the urethra is at the end of the penis. Tell a health care provider about:  Any allergies you have.  All medicines you are taking, including vitamins, herbs, eye drops, creams, and over-the-counter medicines.  Any problems you or family members have had with anesthetic medicines.  Any blood disorders you have.  Any surgeries you have had.  Any medical conditions you have.  Any prostate infections you have had. What are the risks? Generally, this is a safe procedure. However, problems may occur, including:  Infection.  Bleeding.  Allergic reactions to medicines.  Damage to other structures or organs, such as: ? The urethra. ? The bladder. ? Muscles that surround the prostate.  Difficulty getting an erection.  Inability to control when you urinate (incontinence).  Scarring, which may cause problems with urine flow. What happens before the  procedure? Medicines Ask your health care provider about:  Changing or stopping your regular medicines. This is especially important if you are taking diabetes medicines or blood thinners.  Taking medicines such as aspirin and ibuprofen. These medicines can thin your blood. Do not take these medicines unless your health care provider tells you to take them.  Taking over-the-counter medicines, vitamins, herbs, and supplements. Eating and drinking Follow instructions from your health care provider about eating and drinking, which may include:  8 hours before the procedure - stop eating heavy meals or foods, such as meat, fried foods, or fatty foods.  6 hours before the procedure - stop eating light meals or foods, such as toast or cereal.  6 hours before the procedure - stop drinking milk or drinks that contain milk.  2 hours before the procedure - stop drinking clear liquids. Staying hydrated Follow instructions from your health care provider about hydration, which may include:  Up to 2 hours before the procedure - you may continue to drink clear liquids, such as water, clear fruit juice, black coffee, and plain tea. General instructions  You may have a physical exam.  You may have a blood or urine sample taken.  Ask your health care provider what steps will be taken to help prevent infection. These may include: ? Washing skin with a germ-killing soap. ? Taking antibiotic medicine.  Plan to have someone take you home from the hospital or clinic. You may not be able to drive for up to 10 days after your procedure.  Plan to have a responsible adult care for you for at least 24 hours after you leave the hospital or   clinic. This is important. What happens during the procedure?   An IV will be inserted into one of your veins.  You will be given one or more of the following: ? A medicine to help you relax (sedative). ? A medicine to make you fall asleep (general anesthetic). ? A  medicine that is injected into your spine to numb the area below and slightly above the injection site (spinal anesthetic).  Your legs will be placed in foot rests (stirrups) so that your legs are apart and your knees are bent.  The resectoscope will be passed through your urethra to your prostate.  Parts of your prostate will be resected using the cutting edge of the resectoscope.  The resectoscope will be removed.  A small, thin tube (catheter) will be passed through your urethra and into your bladder. The catheter will drain urine into a bag outside of your body. ? Fluid may be passed through the catheter to keep the catheter open. The procedure may vary among health care providers and hospitals. What happens after the procedure?  Your blood pressure, heart rate, breathing rate, and blood oxygen level will be monitored until you leave the hospital or clinic.  You may continue to receive fluids and medicines through an IV.  You may have some pain. Pain medicine will be available to help you.  You will have a catheter draining your urine. ? You may have blood in your urine. Your catheter may be kept in until your urine is clear. ? Your urinary drainage will be monitored. If necessary, your bladder may be rinsed out (irrigated) through your catheter.  You will be encouraged to walk around as soon as possible.  You may have to wear compression stockings. These stockings help prevent blood clots and reduce swelling in your legs.  Do not drive for 24 hours if you were given a sedative during your procedure. Summary  Transurethral resection of the prostate (TURP) is the removal (resection) of part of the gland that produces semen (prostate gland).  The goal of this procedure is to remove enough prostate tissue to allow for a normal flow of urine.  Follow instructions from your health care provider about taking medicines and about eating and drinking before the procedure. This  information is not intended to replace advice given to you by your health care provider. Make sure you discuss any questions you have with your health care provider. Document Revised: 04/27/2018 Document Reviewed: 10/06/2017 Elsevier Patient Education  2020 [employer]  

## 2019-10-30 NOTE — Progress Notes (Signed)
Urological Symptom Review  Patient is experiencing the following symptoms: Frequent urination Hard to postpone urination Get up at night to urinate Leakage of urine Stream starts and stops Erection problems (male only) Penile pain (male only)    Review of Systems  Gastrointestinal (upper)  : Indigestion/heartburn  Gastrointestinal (lower) : Constipation  Constitutional : Fatigue  Skin: Itching  Eyes: Blurred vision  Ear/Nose/Throat : Sinus problems  Hematologic/Lymphatic: Easy bruising  Cardiovascular : Chest pain  Respiratory : Shortness of breath  Endocrine: Excessive thirst  Musculoskeletal: Back pain Joint pain  Neurological: Headaches Dizziness  Psychologic: Depression Anxiety

## 2019-10-31 ENCOUNTER — Encounter: Payer: Self-pay | Admitting: Cardiology

## 2019-10-31 ENCOUNTER — Telehealth (INDEPENDENT_AMBULATORY_CARE_PROVIDER_SITE_OTHER): Payer: Medicare Other | Admitting: Cardiology

## 2019-10-31 VITALS — Ht 70.0 in | Wt 235.0 lb

## 2019-10-31 DIAGNOSIS — I5022 Chronic systolic (congestive) heart failure: Secondary | ICD-10-CM

## 2019-10-31 MED ORDER — ENTRESTO 49-51 MG PO TABS: 1.0000 | ORAL_TABLET | Freq: Two times a day (BID) | ORAL | 3 refills | Status: DC

## 2019-10-31 NOTE — Addendum Note (Signed)
Addended by: Burman Nieves T on: 10/31/2019 11:55 AM   Modules accepted: Orders

## 2019-10-31 NOTE — Progress Notes (Signed)
Virtual Visit via Telephone Note   This visit type was conducted due to national recommendations for restrictions regarding the COVID-19 Pandemic (e.g. social distancing) in an effort to limit this patient's exposure and mitigate transmission in our community.  Due to his co-morbid illnesses, this patient is at least at moderate risk for complications without adequate follow up.  This format is felt to be most appropriate for this patient at this time.  The patient did not have access to video technology/had technical difficulties with video requiring transitioning to audio format only (telephone).  All issues noted in this document were discussed and addressed.  No physical exam could be performed with this format.  Please refer to the patient's chart for his  consent to telehealth for [facility].    Date:  10/31/2019   ID:  [name] Dutton., DOB April 17, 1965, MRN [id number] The patient was identified using 2 identifiers.  Patient Location: Home Provider Location: Other:  Office  PCP:  Rebekah Chesterfield, NP  Cardiologist:  [name], MD  Electrophysiologist:  None   Evaluation Performed:  Follow-Up Visit  Chief Complaint:  Follow up visit  History of Present Illness:    [name] Rhames. is a 54 y.o. male seen today for a focused visit on history of chronic systolic HF   1. CAD/ICM/Chronic systolic HF - history of anterior STEMI 02/2008, received DES to LAD. LVEF at that time was 35% - 2012 myoview anterior scar with mild ischemia - 06/2011 echo LVEF 45-50% - 06/2011 GXT no ischemia - 07/2016 echo shows LVEF down to 30-35%, diffuse hypokinesis with akinesis of anerior and anteroseptal walls. Evidence of LV thrombus - with drop in LVEF and ongoing chest pain was referred for cath  08/2016 cath: see report below, received DES to D2 99%. Ok to stop ASA once coumadin is therapeutic per interventional cardiology, continue plavix.  11/2016 echo LVEF 35%, apical thrombus -  02/2017 echo LVEF 35-40%, restrictive, no clear thrombus  Jan 2020 nuclear stress: large anterior/anteroseptal scar, LVEF 22%.    - last visit we changed to entersto 24/26mg  bid. Tolerating well without side effects, not expensive through his insurance.  - no recent symptoms   SH: he is on disability    The patient does not have symptoms concerning for COVID-19 infection (fever, chills, cough, or new shortness of breath).    Past Medical History:  Diagnosis Date  . Anxiety   . Arthritis    in lower back  . Coronary artery disease    a. 02/2008: anterior STEMI s/p DES to prox LAD, b. Myoview 2012: scar in the anterior wall with mild ischemia and EF 33%, c. neg GXT 06/2011  d. 08/2016: cath with DES to diagonal   . Depression with anxiety   . Diabetes mellitus    TYPE II  . Fatty liver    H/O ELEVATED LIVER ENZYMES  . High cholesterol   . Hyperlipidemia   . Hypertension   . Ischemic cardiomyopathy    a. Previous EF 33-35% 2012, b. improved to 45-50% by echo 06/2011  . LV (left ventricular) mural thrombus without MI (HCC)   . Noncompliance    Prior hx of med noncompliance  . OSA (obstructive sleep apnea)   . Sleep apnea    Past Surgical History:  Procedure Laterality Date  . CARDIAC CATHETERIZATION    . CORONARY STENT INTERVENTION N/A 08/21/2016   Procedure: CORONARY STENT INTERVENTION;  Surgeon: Kathleene Hazel, MD;  Location: [facility] INVASIVE CV LAB;  Service: Cardiovascular;  Laterality: N/A;  . INTRAVASCULAR PRESSURE WIRE/FFR STUDY N/A 08/21/2016   Procedure: INTRAVASCULAR PRESSURE WIRE/FFR STUDY;  Surgeon: Kathleene Hazel, MD;  Location: [facility] INVASIVE CV LAB;  Service: Cardiovascular;  Laterality: N/A;  . LIVER BIOPSY    . MI WITH STENTS    . NASAL SEPTOPLASTY W/ TURBINOPLASTY N/A 01/25/2019   Procedure: BILATERAL TURBINATE REDUCTION;  Surgeon: Newman Pies, MD;  Location: [facility] OR;  Service: ENT;  Laterality: N/A;  . NASAL SINUS SURGERY    . RIGHT/LEFT HEART CATH  AND CORONARY ANGIOGRAPHY N/A 08/21/2016   Procedure: Right/Left Heart Cath and Coronary Angiography;  Surgeon: Kathleene Hazel, MD;  Location: [facility] INVASIVE CV LAB;  Service: Cardiovascular;  Laterality: N/A;     Current Meds  Medication Sig  . alfuzosin (UROXATRAL) 10 MG 24 hr tablet TAKE 1 TABLET BY MOUTH AT BEDTIME  . aspirin EC 81 MG tablet Take 81 mg by mouth daily.  . carvedilol (COREG) 12.5 MG tablet Take 1.5 tablets (18.75 mg total) by mouth 2 (two) times daily with a meal.  . escitalopram (LEXAPRO) 10 MG tablet Take 10 mg by mouth daily.  Marland Kitchen glipiZIDE (GLUCOTROL XL) 10 MG 24 hr tablet Take 10 mg by mouth daily.  Marland Kitchen JARDIANCE 25 MG TABS tablet Take 25 mg by mouth daily.  . nitroGLYCERIN (NITROSTAT) 0.4 MG SL tablet Place 0.4 mg under the tongue every 5 (five) minutes as needed for chest pain.  . sacubitril-valsartan (ENTRESTO) 24-26 MG Take 1 tablet by mouth 2 (two) times daily.  . Semaglutide (RYBELSUS) 3 MG TABS Take 3 mg by mouth daily.  . sildenafil (REVATIO) 20 MG tablet Take 20 mg by mouth daily as needed.   . silodosin (RAPAFLO) 8 MG CAPS capsule Take 1 capsule (8 mg total) by mouth daily with breakfast.  . simvastatin (ZOCOR) 40 MG tablet Take 1 tablet (40 mg total) by mouth at bedtime.  Marland Kitchen spironolactone (ALDACTONE) 25 MG tablet Take 0.5 tablets (12.5 mg total) by mouth daily.  . tadalafil (CIALIS) 20 MG tablet Take 1 tablet (20 mg total) by mouth daily as needed for erectile dysfunction.     Allergies:   Patient has no known allergies.   Social History   Tobacco Use  . Smoking status: Never Smoker  . Smokeless tobacco: Never Used  Vaping Use  . Vaping Use: Never used  Substance Use Topics  . Alcohol use: No  . Drug use: No     Family Hx: The patient's family history includes Cancer in his brother; Coronary artery disease in an other family member; Diabetes Mellitus II in his father and mother; Heart disease in his father.  ROS:   Please see the history of  present illness.     All other systems reviewed and are negative.   Prior CV studies:   The following studies were reviewed today:  6./2013 echo Study Conclusions  - Left ventricle: Systolic function was mildly reduced. The estimated ejection fraction was in the range of 45% to 50%. - Left atrium: The atrium was mildly dilated.  08/07/16 echo Study Conclusions  - Left ventricle: LV systolic function is depressed at approximately 30 to 35% with severe hypokinesis/akinesis of the inferior, septal, distal lateral and apical walls. Cannot exclude mural thrombus at apex. Suggest limited echo with Definity to define The cavity size was normal. Wall thickness was normal. - Mitral valve: There was mild regurgitation. - Left atrium: The atrium was  mildly dilated.  08/14/16 echo Study Conclusions  - Procedure narrative: Transthoracic echocardiography. Image quality was adequate. The study was technically difficult. Intravenous contrast (Definity) was administered. - Left ventricle: Systolic function was moderately to severely reduced. The estimated ejection fraction was in the range of 30% to 35%. Diffuse hypokinesis. There is akinesis of the anteroseptal, anterior, and apical myocardium. - Right ventricle: Systolic function was normal.  Impressions:  - Limited study with Definity contrast. LVEF is in the range of 30-35%. There is a loosely organized LV apical thrombus noted. Diffuse hypokinesis overall with akinesis of the anterior, anteroseptal, and apical myocardium. RV contraction normal based on limited views.  08/2016 cath  Mid RCA-2 lesion, 30 %stenosed.  Mid RCA-1 lesion, 30 %stenosed.  Ost Ramus to Ramus lesion, 20 %stenosed.  Ost LAD to Prox LAD lesion, 10 %stenosed.  Prox LAD to Mid LAD lesion, 60 %stenosed.  A STENT SYNERGY DES 2.25X12 drug eluting stent was successfully placed.  2nd Diag lesion, 99 %stenosed.  Post  intervention, there is a 0% residual stenosis.  1. Patent stent proximal LAD 2. Moderate stenosis mid LAD. FFR was 0.84 suggesting the stenosis was not flow limiting.  3. The Diagonal Lyden Redner is a moderate caliber vessel with a 99% stenosis.  4. Successful PTCA/DES x 1 Diagonal Rokhaya Quinn 5. Mild disease RCA  Recommendations: Will continue ASA and Plavix for now. He will need to be started on coumadin tomorrow before discharge for his LV thrombus. He can stop ASA when his INR is therapeutic on coumadin since he was not an ACS. I would plan long term coumadin for LV thrombus and Plavix for at least one year given placement of DES. Continue statin and beta blocker.  02/2017 echo Study Conclusions  - Left ventricle: The cavity size was normal. Wall thickness was increased in a pattern of mild LVH. Systolic function was moderately reduced. The estimated ejection fraction was in the range of 35% to 40%. No definitive thrombus seen with contrast enhancement. Diffuse hypokinesis. Diastolic dysfunction with restrictive physiology. - Regional wall motion abnormality: Akinesis of the mid anterior and basal-mid anteroseptal myocardium. - Right ventricle: Systolic function was reduced.   Jan 2020 nuclear stress  No diagnostic ST segment changes to indicate ischemia.  Large, dense, anterior and anteroseptal defect extending from apex to base consistent with infarct scar.  This is a high risk study.  Nuclear stress EF: 22%. Consistent with ischemic cardiomyopathy and large anterior/anteroseptal infarct scar, no significant ischemia.  Labs/Other Tests and Data Reviewed:    EKG:  No ECG reviewed.  Recent Labs: 01/18/2019: Hemoglobin 16.6; Platelets 287 01/19/2019: ALT 31 06/30/2019: BUN 21; Creatinine, Ser 0.93; Potassium 4.1; Sodium 138   Recent Lipid Panel Lab Results  Component Value Date/Time   CHOL 216 (H) 08/07/2016 12:40 PM   TRIG 118 08/07/2016 12:40 PM   HDL 34  (L) 08/07/2016 12:40 PM   CHOLHDL 6.4 08/07/2016 12:40 PM   LDLCALC 158 (H) 08/07/2016 12:40 PM    Wt Readings from Last 3 Encounters:  10/31/19 235 lb (106.6 kg)  09/29/19 239 lb 12.8 oz (108.8 kg)  08/17/19 (!) 239 lb 12.8 oz (108.8 kg)     Risk Assessment/Calculations:      Objective:    Vital Signs:  Ht 5\' 10"  (1.778 m)   Wt 235 lb (106.6 kg)   BMI 33.72 kg/m    Normal affect, normal speech pattern and tone. Comfortable, no apparent distress. No audible signs of sob or wheezing.  ASSESSMENT & PLAN:    1. CAD/ICM/chronic systolic HF - Fatigue on higher coreg dosing.Prior issues with orthostatic symptoms had limited medical therapy but these have resolved - tolerating recent change to entresto, we will increase dose to 49/51mg  bid to see if tolerated.  - check BMET 2 weeks    F/u 1 month, if tolerating current entresto dose titrate further.   COVID-19 Education: The signs and symptoms of COVID-19 were discussed with the patient and how to seek care for testing (follow up with PCP or arrange E-visit).  The importance of social distancing was discussed today.  Time:   Today, I have spent 17 minutes with the patient with telehealth technology discussing the above problems.     Medication Adjustments/Labs and Tests Ordered: Current medicines are reviewed at length with the patient today.  Concerns regarding medicines are outlined above.   Tests Ordered: No orders of the defined types were placed in this encounter.   Medication Changes: No orders of the defined types were placed in this encounter.   Follow Up:  In Person in 1 month(s)  Signed, [name], MD  10/31/2019 11:18 AM     [facility]

## 2019-10-31 NOTE — Patient Instructions (Signed)
Your physician recommends that you schedule a follow-up appointment in: 1 MONTH WITH Nena Polio, NP  Your physician has recommended you make the following change in your medication:   INCREASE ENTRESTO 49/51 MG BID   Your physician recommends that you return for lab work IN 2 WEEKS BMP  Thank you for choosing [facility]!!

## 2019-11-29 NOTE — Progress Notes (Signed)
Cardiology Office Note  Date: 11/30/2019   ID: [name] Dutton., DOB 1965/12/16, MRN [id number]  PCP:  Rebekah Chesterfield, NP  Cardiologist:  [name], MD Electrophysiologist:  None   Chief Complaint: Chronic systolic heart failure  History of Present Illness: [name] Adams. is a 54 y.o. male with a history of CAD, ICM, chronic systolic heart failure.  Last encounter with Dr. Wyline Mood 10/31/2019.  Patient had previously seen Dr. Wyline Mood and started on Entresto 24/26 mg p.o. twice daily.  He was tolerating well without side effects.  Having no recent symptoms.  Plan was to increase Entresto dosing to 49/51 mg p.o. twice daily and check basic metabolic panel in 2 weeks.  He was to follow-up in 1 month and if tolerating current Entresto dose would titrate further.  Patient is here for 1 month follow-up.  States she is tolerating the [place] well.  He had lab work to be done after increasing dose at last visit.  He states he apparently forgot to have the lab work drawn.  However tomorrow he is having preop lab work for upcoming TURP surgery for Dr. Ronne Binning.  He denies any anginal or exertional symptoms, orthostatic symptoms, DOE, SOB, lower extremity edema.  No palpitations or arrhythmias, CVA or TIA-like symptoms, PND, orthopnea.  Past Medical History:  Diagnosis Date  . Anxiety   . Arthritis    in lower back  . Coronary artery disease    a. 02/2008: anterior STEMI s/p DES to prox LAD, b. Myoview 2012: scar in the anterior wall with mild ischemia and EF 33%, c. neg GXT 06/2011  d. 08/2016: cath with DES to diagonal   . Depression with anxiety   . Diabetes mellitus    TYPE II  . Fatty liver    H/O ELEVATED LIVER ENZYMES  . High cholesterol   . Hyperlipidemia   . Hypertension   . Ischemic cardiomyopathy    a. Previous EF 33-35% 2012, b. improved to 45-50% by echo 06/2011  . LV (left ventricular) mural thrombus without MI (HCC)   . Noncompliance    Prior hx of med  noncompliance  . OSA (obstructive sleep apnea)   . Sleep apnea     Past Surgical History:  Procedure Laterality Date  . CARDIAC CATHETERIZATION    . CORONARY STENT INTERVENTION N/A 08/21/2016   Procedure: CORONARY STENT INTERVENTION;  Surgeon: Kathleene Hazel, MD;  Location: [facility] INVASIVE CV [facility];  Service: Cardiovascular;  Laterality: N/A;  . INTRAVASCULAR PRESSURE WIRE/FFR STUDY N/A 08/21/2016   Procedure: INTRAVASCULAR PRESSURE WIRE/FFR STUDY;  Surgeon: Kathleene Hazel, MD;  Location: [facility] INVASIVE CV [facility];  Service: Cardiovascular;  Laterality: N/A;  . LIVER BIOPSY    . MI WITH STENTS    . NASAL SEPTOPLASTY W/ TURBINOPLASTY N/A 01/25/2019   Procedure: BILATERAL TURBINATE REDUCTION;  Surgeon: Newman Pies, MD;  Location: [facility] OR;  Service: ENT;  Laterality: N/A;  . NASAL SINUS SURGERY    . RIGHT/LEFT HEART CATH AND CORONARY ANGIOGRAPHY N/A 08/21/2016   Procedure: Right/Left Heart Cath and Coronary Angiography;  Surgeon: Kathleene Hazel, MD;  Location: [facility] INVAS[facility] CV LAB;  Service: Cardiovascular;  Laterality: N/A;    Current Outpatient Medications  Medication Sig Dispense Refill  . alfuzosin (UROXATRAL) 10 MG 24 hr tablet TAKE 1 TABLET BY MOUTH AT BEDTIME (Patient taking differently: Take 10 mg by mouth at bedtime. ) 30 tablet 0  . aspirin EC 81 MG tablet Take 81 mg by  mouth daily.    . carvedilol (COREG) 12.5 MG tablet Take 1.5 tablets (18.75 mg total) by mouth 2 (two) times daily with a meal. 270 tablet 1  . escitalopram (LEXAPRO) 10 MG tablet Take 10 mg by mouth daily.    Marland Kitchen glipiZIDE (GLUCOTROL XL) 10 MG 24 hr tablet Take 10 mg by mouth daily.    Marland Kitchen JARDIANCE 25 MG TABS tablet Take 25 mg by mouth daily.    . nitroGLYCERIN (NITROSTAT) 0.4 MG SL tablet Place 0.4 mg under the tongue every 5 (five) minutes as needed for chest pain.    . sacubitril-valsartan (ENTRESTO) 49-51 MG Take 1 tablet by mouth 2 (two) times daily. 60 tablet 3  . Semaglutide (RYBELSUS) 3 MG TABS Take 3 mg by  mouth daily.    . sildenafil (REVATIO) 20 MG tablet Take 20 mg by mouth daily as needed (ED).     . silodosin (RAPAFLO) 8 MG CAPS capsule Take 1 capsule (8 mg total) by mouth daily with breakfast. 90 capsule 3  . simvastatin (ZOCOR) 40 MG tablet Take 1 tablet (40 mg total) by mouth at bedtime. 90 tablet 1  . tadalafil (CIALIS) 20 MG tablet Take 1 tablet (20 mg total) by mouth daily as needed for erectile dysfunction. 10 tablet 5  . spironolactone (ALDACTONE) 25 MG tablet Take 0.5 tablets (12.5 mg total) by mouth daily. 45 tablet 1   No current facility-administered medications for this visit.   Allergies:  Patient has no known allergies.   Social History: The patient  reports that he has never smoked. He has never used smokeless tobacco. He reports that he does not drink alcohol and does not use drugs.   Family History: The patient's family history includes Cancer in his brother; Coronary artery disease in an other family member; Diabetes Mellitus II in his father and mother; Heart disease in his father.   ROS:  Please see the history of present illness. Otherwise, complete review of systems is positive for none.  All other systems are reviewed and negative.   Physical Exam: VS:  BP 132/82   Pulse 88   Ht 5\' 10"  (1.778 m)   Wt 234 lb (106.1 kg)   SpO2 95%   BMI 33.58 kg/m , BMI Body mass index is 33.58 kg/m.  Wt Readings from Last 3 Encounters:  11/30/19 234 lb (106.1 kg)  10/31/19 235 lb (106.6 kg)  09/29/19 239 lb 12.8 oz (108.8 kg)    General: Patient appears comfortable at rest. Neck: Supple, no elevated JVP or carotid bruits, no thyromegaly. Lungs: Clear to auscultation, nonlabored breathing at rest. Cardiac: Regular rate and rhythm, no S3 or significant systolic murmur, no pericardial rub. Extremities: No pitting edema, distal pulses 2+. Skin: Warm and dry. Musculoskeletal: No kyphosis. Neuropsychiatric: Alert and oriented x3, affect grossly appropriate.  ECG: EKG today  shows normal sinus rhythm rate of 78, septal infarct age undetermined.  Recent Labwork: 01/18/2019: Hemoglobin 16.6; Platelets 287 01/19/2019: ALT 31; AST 22 06/30/2019: BUN 21; Creatinine, Ser 0.93; Potassium 4.1; Sodium 138     Component Value Date/Time   CHOL 216 (H) 08/07/2016 1240   TRIG 118 08/07/2016 1240   HDL 34 (L) 08/07/2016 1240   CHOLHDL 6.4 08/07/2016 1240   VLDL 24 08/07/2016 1240   LDLCALC 158 (H) 08/07/2016 1240    Other Studies Reviewed Today:  Echocardiogram 06/22/2019 1. Left ventricular ejection fraction, by estimation, is 35%. The left ventricle has moderately decreased function. The left ventricle demonstrates  regional wall motion abnormalities (see scoring diagram/findings for description). The left ventricular internal cavity size was mildly dilated. Left ventricular diastolic parameters are consistent with Grade I diastolic dysfunction (impaired relaxation). Elevated left atrial pressure. 2. Right ventricular systolic function is normal. The right ventricular size is normal. There is normal pulmonary artery systolic pressure. 3. The mitral valve is normal in structure. Trivial mitral valve regurgitation. No evidence of mitral stenosis. 4. The aortic valve is tricuspid. Aortic valve regurgitation is not visualized. No aortic stenosis is present. 5. The inferior vena cava is normal in size with greater than 50% respiratory variability, suggesting right atrial pressure of 3 mmHg. Comparison(s): Echocardiogram done 03/03/17 showed an EF of 35-40%.   6./2013 echo Study Conclusions - Left ventricle: Systolic function was mildly reduced. The estimated ejection fraction was in the range of 45% to 50%. - Left atrium: The atrium was mildly dilated.  08/07/16 echo Study Conclusions - Left ventricle: LV systolic function is depressed at approximately 30 to 35% with severe hypokinesis/akinesis of the inferior, septal, distal lateral and apical walls.  Cannot exclude mural thrombus at apex. Suggest limited echo with Definity to define The cavity size was normal. Wall thickness was normal. - Mitral valve: There was mild regurgitation. - Left atrium: The atrium was mildly dilated.  08/14/16 echo Study Conclusions  - Procedure narrative: Transthoracic echocardiography. Image quality was adequate. The study was technically difficult. Intravenous contrast (Definity) was administered. - Left ventricle: Systolic function was moderately to severely reduced. The estimated ejection fraction was in the range of 30% to 35%. Diffuse hypokinesis. There is akinesis of the anteroseptal, anterior, and apical myocardium. - Right ventricle: Systolic function was normal.  Impressions:  - Limited study with Definity contrast. LVEF is in the range of 30-35%. There is a loosely organized LV apical thrombus noted. Diffuse hypokinesis overall with akinesis of the anterior, anteroseptal, and apical myocardium. RV contraction normal based on limited views.  08/2016 cath  Mid RCA-2 lesion, 30 %stenosed.  Mid RCA-1 lesion, 30 %stenosed.  Ost Ramus to Ramus lesion, 20 %stenosed.  Ost LAD to Prox LAD lesion, 10 %stenosed.  Prox LAD to Mid LAD lesion, 60 %stenosed.  A STENT SYNERGY DES 2.25X12 drug eluting stent was successfully placed.  2nd Diag lesion, 99 %stenosed.  Post intervention, there is a 0% residual stenosis.  1. Patent stent proximal LAD 2. Moderate stenosis mid LAD. FFR was 0.84 suggesting the stenosis was not flow limiting.  3. The Diagonal branch is a moderate caliber vessel with a 99% stenosis.  4. Successful PTCA/DES x 1 Diagonal branch 5. Mild disease RCA  Recommendations: Will continue ASA and Plavix for now. He will need to be started on coumadin tomorrow before discharge for his LV thrombus. He can stop ASA when his INR is therapeutic on coumadin since he was not an ACS. I would plan long term  coumadin for LV thrombus and Plavix for at least one year given placement of DES. Continue statin and beta blocker.  02/2017 echo Study Conclusions  - Left ventricle: The cavity size was normal. Wall thickness was increased in a pattern of mild LVH. Systolic function was moderately reduced. The estimated ejection fraction was in the range of 35% to 40%. No definitive thrombus seen with contrast enhancement. Diffuse hypokinesis. Diastolic dysfunction with restrictive physiology. - Regional wall motion abnormality: Akinesis of the mid anterior and basal-mid anteroseptal myocardium. - Right ventricle: Systolic function was reduced.   Jan 2020 nuclear stress  No diagnostic ST  segment changes to indicate ischemia.  Large, dense, anterior and anteroseptal defect extending from apex to base consistent with infarct scar.  This is a high risk study.  Nuclear stress EF: 22%. Consistent with ischemic cardiomyopathy and large anterior/anteroseptal infarct scar, no significant ischemia.  Assessment and Plan:  1. CAD in native artery   2. Cardiomyopathy, ischemic   3. Chronic systolic heart failure (HCC)   4. Essential hypertension, benign    1. CAD in native artery No progressive anginal or exertional symptoms.  No nitroglycerin use.  Continue aspirin 81 mg daily, sublingual nitroglycerin 0.4 mg as needed.  2. Cardiomyopathy, ischemic Recent echocardiogram June 2021: EF 35%, Positive for RWMA's, trivial MR.  Continue carvedilol 18.75 mg p.o. twice daily.  Continue Entresto 49/51 mg p.o. twice daily.  Continue spironolactone 12.5 mg p.o. twice daily.  We will wait for pending basic metabolic panel to titrate Entresto.  Patient states he forgot to have the lab drawn.  He has preop labs for upcoming TURP surgery tomorrow which includes a basic metabolic panel.  Pending results we will titrate Entresto currently.  3. Chronic systolic heart failure (HCC) Recent echo June 2021 EF  35%, continue Entresto 49/51 mg p.o. twice daily.  Continue carvedilol 18.75 mg p.o. twice daily.  Continue spironolactone 12.5 mg p.o. twice daily.  Will await basic metabolic panel results to titrate Entresto.  4. Essential hypertension, benign BP reasonably warm well controlled with blood pressure 132/82 today.  Continue carvedilol 18.75 mg p.o. twice daily.  Carvedilol 18.75 mg p.o. twice daily.  Spironolactone 12.5 mg p.o. twice daily.   Medication Adjustments/Labs and Tests Ordered: Current medicines are reviewed at length with the patient today.  Concerns regarding medicines are outlined above.   Disposition: Follow-up with Dr. Wyline Mood or APP 3 months.  Signed, Rennis Harding, NP 11/30/2019 1:35 PM    [facility] [address] [place], [place], [place] [address] Phone: [phone]; Fax: [phone]

## 2019-11-29 NOTE — Patient Instructions (Signed)
[name] [name].  11/29/2019     @PREFPERIOPPHARMACY @   Your procedure is scheduled on  12/04/2019.  Report to [facility] at  1100  A.M.  Call this number if you have problems the morning of surgery:  [phone]   Remember:  Do not eat or drink after midnight.                        Take these medicines the morning of surgery with A SIP OF WATER  Carvrdilol, lexapro, entresto.    Do not wear jewelry, make-up or nail polish.  Do not wear lotions, powders, or perfumes. Please wear deodorant and brush your teeth.  Do not shave 48 hours prior to surgery.  Men may shave face and neck.  Do not bring valuables to the hospital.  [facility] is not responsible for any belongings or valuables.  Contacts, dentures or bridgework may not be worn into surgery.  Leave your suitcase in the car.  After surgery it may be brought to your room.  For patients admitted to the hospital, discharge time will be determined by your treatment team.  Patients discharged the day of surgery will not be allowed to drive home.   Name and phone number of your driver:   family Special instructions:   DO NOT smoke the morning of your procedure.  Please read over the following fact sheets that you were given. Anesthesia Post-op Instructions and Care and Recovery After Surgery       Transurethral Resection of the Prostate, Care After This sheet gives you information about how to care for yourself after your procedure. Your health care provider may also give you more specific instructions. If you have problems or questions, contact your health care provider. What can I expect after the procedure? After the procedure, it is common to have:  Mild pain in your lower abdomen.  Soreness or mild discomfort in your penis from having the catheter inserted during the procedure.  A feeling of urgency when you need to urinate.  A small amount of blood in your urine. You may notice some small blood  clots in your urine. These are normal. Follow these instructions at home: Medicines  Take over-the-counter and prescription medicines only as told by your health care provider.  If you were prescribed an antibiotic medicine, take it as told by your health care provider. Do not stop taking the antibiotic even if you start to feel better.  Ask your health care provider if the medicine prescribed to you: ? Requires you to avoid driving or using heavy machinery. ? Can cause constipation. You may need to take actions to prevent or treat constipation, such as:  Take over-the-counter or prescription medicines.  Eat foods that are high in fiber, such as fresh fruits and vegetables, whole grains, and beans.  Limit foods that are high in fat and processed sugars, such as fried or sweet foods.  Do not drive for 24 hours if you were given a sedative during your procedure. Activity   Return to your normal activities as told by your health care provider. Ask your health care provider what activities are safe for you.  Do not lift anything that is heavier than 10 lb (4.5 kg), or the limit that you are told, for 3 weeks after the procedure or until your health care provider says that it is safe.  Avoid intense physical activity  for as long as told by your health care provider.  Avoid sitting for a long time without moving. Get up and move around one or more times every few hours. This helps to prevent blood clots. You may increase your physical activity gradually as you start to feel better. Lifestyle  Do not drink alcohol for as long as told by your health care provider. This is especially important if you are taking prescription pain medicines.  Do not engage in sexual activity until your health care provider says that you can do this. General instructions   Do not take baths, swim, or use a hot tub until your health care provider approves.  Drink enough fluid to keep your urine pale  yellow.  Urinate as soon as you feel the need to. Do not try to hold your urine for long periods of time.  If your health care provider approves, you may take a stool softener for 2-3 weeks to prevent you from straining to have a bowel movement.  Wear compression stockings as told by your health care provider. These stockings help to prevent blood clots and reduce swelling in your legs.  Keep all follow-up visits as told by your health care provider. This is important. Contact a health care provider if you have:  Difficulty urinating.  A fever.  Pain that gets worse or does not improve with medicine.  Blood in your urine that does not go away after 1 week of resting and drinking more fluids.  Swelling in your penis or testicles. Get help right away if:  You are unable to urinate.  You are having more blood clots in your urine instead of fewer.  You have: ? Large blood clots. ? A lot of blood in your urine. ? Pain in your back or lower abdomen. ? Pain or swelling in your legs. ? Chills and you are shaking. ? Difficulty breathing or shortness of breath. Summary  After the procedure, it is common to have a small amount of blood in your urine.  Avoid heavy lifting and intense physical activity for as long as told by your health care provider.  Urinate as soon as you feel the need to. Do not try to hold your urine for long periods of time.  Keep all follow-up visits as told by your health care provider. This is important. This information is not intended to replace advice given to you by your health care provider. Make sure you discuss any questions you have with your health care provider. Document Revised: 04/27/2018 Document Reviewed: 10/06/2017 Elsevier Patient Education  2020 [employer]  General Anesthesia, Adult, Care After This sheet gives you information about how to care for yourself after your procedure. Your health care provider may also give you more specific  instructions. If you have problems or questions, contact your health care provider. What can I expect after the procedure? After the procedure, the following side effects are common:  Pain or discomfort at the IV site.  Nausea.  Vomiting.  Sore throat.  Trouble concentrating.  Feeling cold or chills.  Weak or tired.  Sleepiness and fatigue.  Soreness and body aches. These side effects can affect parts of the body that were not involved in surgery. Follow these instructions at home:  For at least 24 hours after the procedure:  Have a responsible adult stay with you. It is important to have someone help care for you until you are awake and alert.  Rest as needed.  Do  not: ? Participate in activities in which you could fall or become injured. ? Drive. ? Use heavy machinery. ? Drink alcohol. ? Take sleeping pills or medicines that cause drowsiness. ? Make important decisions or sign legal documents. ? Take care of children on your own. Eating and drinking  Follow any instructions from your health care provider about eating or drinking restrictions.  When you feel hungry, start by eating small amounts of foods that are soft and easy to digest (bland), such as toast. Gradually return to your regular diet.  Drink enough fluid to keep your urine pale yellow.  If you vomit, rehydrate by drinking water, juice, or clear broth. General instructions  If you have sleep apnea, surgery and certain medicines can increase your risk for breathing problems. Follow instructions from your health care provider about wearing your sleep device: ? Anytime you are sleeping, including during daytime naps. ? While taking prescription pain medicines, sleeping medicines, or medicines that make you drowsy.  Return to your normal activities as told by your health care provider. Ask your health care provider what activities are safe for you.  Take over-the-counter and prescription medicines only  as told by your health care provider.  If you smoke, do not smoke without supervision.  Keep all follow-up visits as told by your health care provider. This is important. Contact a health care provider if:  You have nausea or vomiting that does not get better with medicine.  You cannot eat or drink without vomiting.  You have pain that does not get better with medicine.  You are unable to pass urine.  You develop a skin rash.  You have a fever.  You have redness around your IV site that gets worse. Get help right away if:  You have difficulty breathing.  You have chest pain.  You have blood in your urine or stool, or you vomit blood. Summary  After the procedure, it is common to have a sore throat or nausea. It is also common to feel tired.  Have a responsible adult stay with you for the first 24 hours after general anesthesia. It is important to have someone help care for you until you are awake and alert.  When you feel hungry, start by eating small amounts of foods that are soft and easy to digest (bland), such as toast. Gradually return to your regular diet.  Drink enough fluid to keep your urine pale yellow.  Return to your normal activities as told by your health care provider. Ask your health care provider what activities are safe for you. This information is not intended to replace advice given to you by your health care provider. Make sure you discuss any questions you have with your health care provider. Document Revised: 01/08/2017 Document Reviewed: 08/21/2016 Elsevier Patient Education  2020 [employer]. How to Use Chlorhexidine for Bathing Chlorhexidine gluconate (CHG) is a germ-killing (antiseptic) solution that is used to clean the skin. It can get rid of the bacteria that normally live on the skin and can keep them away for about 24 hours. To clean your skin with CHG, you may be given:  A CHG solution to use in the shower or as part of a sponge  bath.  A prepackaged cloth that contains CHG. Cleaning your skin with CHG may help lower the risk for infection:  While you are staying in the intensive care unit of the hospital.  If you have a vascular access, such as a central line,  to provide short-term or long-term access to your veins.  If you have a catheter to drain urine from your bladder.  If you are on a ventilator. A ventilator is a machine that helps you breathe by moving air in and out of your lungs.  After surgery. What are the risks? Risks of using CHG include:  A skin reaction.  Hearing loss, if CHG gets in your ears.  Eye injury, if CHG gets in your eyes and is not rinsed out.  The CHG product catching fire. Make sure that you avoid smoking and flames after applying CHG to your skin. Do not use CHG:  If you have a chlorhexidine allergy or have previously reacted to chlorhexidine.  On babies younger than 22 months of age. How to use CHG solution  Use CHG only as told by your health care provider, and follow the instructions on the label.  Use the full amount of CHG as directed. Usually, this is one bottle. During a shower Follow these steps when using CHG solution during a shower (unless your health care provider gives you different instructions): 1. Start the shower. 2. Use your normal soap and shampoo to wash your face and hair. 3. Turn off the shower or move out of the shower stream. 4. Pour the CHG onto a clean washcloth. Do not use any type of brush or rough-edged sponge. 5. Starting at your neck, lather your body down to your toes. Make sure you follow these instructions: ? If you will be having surgery, pay special attention to the part of your body where you will be having surgery. Scrub this area for at least 1 minute. ? Do not use CHG on your head or face. If the solution gets into your ears or eyes, rinse them well with water. ? Avoid your genital area. ? Avoid any areas of skin that have broken  skin, cuts, or scrapes. ? Scrub your back and under your arms. Make sure to wash skin folds. 6. Let the lather sit on your skin for 1-2 minutes or as long as told by your health care provider. 7. Thoroughly rinse your entire body in the shower. Make sure that all body creases and crevices are rinsed well. 8. Dry off with a clean towel. Do not put any substances on your body afterward--such as powder, lotion, or perfume--unless you are told to do so by your health care provider. Only use lotions that are recommended by the manufacturer. 9. Put on clean clothes or pajamas. 10. If it is the night before your surgery, sleep in clean sheets.  During a sponge bath Follow these steps when using CHG solution during a sponge bath (unless your health care provider gives you different instructions): 1. Use your normal soap and shampoo to wash your face and hair. 2. Pour the CHG onto a clean washcloth. 3. Starting at your neck, lather your body down to your toes. Make sure you follow these instructions: ? If you will be having surgery, pay special attention to the part of your body where you will be having surgery. Scrub this area for at least 1 minute. ? Do not use CHG on your head or face. If the solution gets into your ears or eyes, rinse them well with water. ? Avoid your genital area. ? Avoid any areas of skin that have broken skin, cuts, or scrapes. ? Scrub your back and under your arms. Make sure to wash skin folds. 4. Let the lather sit on  your skin for 1-2 minutes or as long as told by your health care provider. 5. Using a different clean, wet washcloth, thoroughly rinse your entire body. Make sure that all body creases and crevices are rinsed well. 6. Dry off with a clean towel. Do not put any substances on your body afterward--such as powder, lotion, or perfume--unless you are told to do so by your health care provider. Only use lotions that are recommended by the manufacturer. 7. Put on clean  clothes or pajamas. 8. If it is the night before your surgery, sleep in clean sheets. How to use CHG prepackaged cloths  Only use CHG cloths as told by your health care provider, and follow the instructions on the label.  Use the CHG cloth on clean, dry skin.  Do not use the CHG cloth on your head or face unless your health care provider tells you to.  When washing with the CHG cloth: ? Avoid your genital area. ? Avoid any areas of skin that have broken skin, cuts, or scrapes. Before surgery Follow these steps when using a CHG cloth to clean before surgery (unless your health care provider gives you different instructions): 1. Using the CHG cloth, vigorously scrub the part of your body where you will be having surgery. Scrub using a back-and-forth motion for 3 minutes. The area on your body should be completely wet with CHG when you are done scrubbing. 2. Do not rinse. Discard the cloth and let the area air-dry. Do not put any substances on the area afterward, such as powder, lotion, or perfume. 3. Put on clean clothes or pajamas. 4. If it is the night before your surgery, sleep in clean sheets.  For general bathing Follow these steps when using CHG cloths for general bathing (unless your health care provider gives you different instructions). 1. Use a separate CHG cloth for each area of your body. Make sure you wash between any folds of skin and between your fingers and toes. Wash your body in the following order, switching to a new cloth after each step: ? The front of your neck, shoulders, and chest. ? Both of your arms, under your arms, and your hands. ? Your stomach and groin area, avoiding the genitals. ? Your right leg and foot. ? Your left leg and foot. ? The back of your neck, your back, and your buttocks. 2. Do not rinse. Discard the cloth and let the area air-dry. Do not put any substances on your body afterward--such as powder, lotion, or perfume--unless you are told to do so  by your health care provider. Only use lotions that are recommended by the manufacturer. 3. Put on clean clothes or pajamas. Contact a health care provider if:  Your skin gets irritated after scrubbing.  You have questions about using your solution or cloth. Get help right away if:  Your eyes become very red or swollen.  Your eyes itch badly.  Your skin itches badly and is red or swollen.  Your hearing changes.  You have trouble seeing.  You have swelling or tingling in your mouth or throat.  You have trouble breathing.  You swallow any chlorhexidine. Summary  Chlorhexidine gluconate (CHG) is a germ-killing (antiseptic) solution that is used to clean the skin. Cleaning your skin with CHG may help to lower your risk for infection.  You may be given CHG to use for bathing. It may be in a bottle or in a prepackaged cloth to use on your skin. Carefully  follow your health care provider's instructions and the instructions on the product label.  Do not use CHG if you have a chlorhexidine allergy.  Contact your health care provider if your skin gets irritated after scrubbing. This information is not intended to replace advice given to you by your health care provider. Make sure you discuss any questions you have with your health care provider. Document Revised: 03/24/2018 Document Reviewed: 12/03/2016 Elsevier Patient Education  [place].

## 2019-11-30 ENCOUNTER — Ambulatory Visit (INDEPENDENT_AMBULATORY_CARE_PROVIDER_SITE_OTHER): Payer: Medicare Other | Admitting: Family Medicine

## 2019-11-30 ENCOUNTER — Encounter: Payer: Self-pay | Admitting: Family Medicine

## 2019-11-30 VITALS — BP 132/82 | HR 88 | Ht 70.0 in | Wt 234.0 lb

## 2019-11-30 DIAGNOSIS — I2510 Atherosclerotic heart disease of native coronary artery without angina pectoris: Secondary | ICD-10-CM | POA: Diagnosis not present

## 2019-11-30 DIAGNOSIS — I255 Ischemic cardiomyopathy: Secondary | ICD-10-CM

## 2019-11-30 DIAGNOSIS — I5022 Chronic systolic (congestive) heart failure: Secondary | ICD-10-CM | POA: Diagnosis not present

## 2019-11-30 DIAGNOSIS — I10 Essential (primary) hypertension: Secondary | ICD-10-CM | POA: Diagnosis not present

## 2019-11-30 NOTE — Addendum Note (Signed)
Addended by: Burman Nieves T on: 11/30/2019 01:46 PM   Modules accepted: Orders

## 2019-11-30 NOTE — Patient Instructions (Signed)
Medication Instructions:   Your physician recommends that you continue on your current medications as directed. Please refer to the Current Medication list given to you today.  Labwork:  Your physician recommends that you schedule a follow-up appointment in: tomorrow to check your BMET. Please have done with your other lab work for your urologist.  Testing/Procedures:  None  Follow-Up:  Your physician recommends that you schedule a follow-up appointment in: 3 months with Dr. Wyline Mood.  Any Other Special Instructions Will Be Listed Below (If Applicable).  If you need a refill on your cardiac medications before your next appointment, please call your pharmacy.

## 2019-12-01 ENCOUNTER — Other Ambulatory Visit: Payer: Self-pay

## 2019-12-01 ENCOUNTER — Encounter (HOSPITAL_COMMUNITY)
Admission: RE | Admit: 2019-12-01 | Discharge: 2019-12-01 | Disposition: A | Discharge status: Home / Self Care | Payer: Medicare Other | Source: Ambulatory Visit | Attending: Urology | Admitting: Urology

## 2019-12-01 ENCOUNTER — Other Ambulatory Visit (HOSPITAL_COMMUNITY)
Admission: RE | Admit: 2019-12-01 | Discharge: 2019-12-01 | Disposition: A | Discharge status: Home / Self Care | Payer: Medicare Other | Source: Ambulatory Visit | Attending: Urology | Admitting: Urology

## 2019-12-01 ENCOUNTER — Encounter (HOSPITAL_COMMUNITY): Payer: Self-pay

## 2019-12-01 DIAGNOSIS — Z01818 Encounter for other preprocedural examination: Secondary | ICD-10-CM | POA: Diagnosis present

## 2019-12-01 DIAGNOSIS — Z20822 Contact with and (suspected) exposure to covid-19: Secondary | ICD-10-CM | POA: Diagnosis not present

## 2019-12-01 DIAGNOSIS — I10 Essential (primary) hypertension: Secondary | ICD-10-CM | POA: Insufficient documentation

## 2019-12-01 HISTORY — DX: Acute myocardial infarction, unspecified: I21.9

## 2019-12-01 LAB — CBC WITH DIFFERENTIAL/PLATELET
Abs Immature Granulocytes: 0.01 10*3/uL (ref 0.00–0.07)
Basophils Absolute: 0 10*3/uL (ref 0.0–0.1)
Basophils Relative: 1 %
Eosinophils Absolute: 0.1 10*3/uL (ref 0.0–0.5)
Eosinophils Relative: 1 %
HCT: 47.3 % (ref 39.0–52.0)
Hemoglobin: 15.9 g/dL (ref 13.0–17.0)
Immature Granulocytes: 0 %
Lymphocytes Relative: 27 %
Lymphs Abs: 1.7 10*3/uL (ref 0.7–4.0)
MCH: 29.6 pg (ref 26.0–34.0)
MCHC: 33.6 g/dL (ref 30.0–36.0)
MCV: 87.9 fL (ref 80.0–100.0)
Monocytes Absolute: 0.6 10*3/uL (ref 0.1–1.0)
Monocytes Relative: 10 %
Neutro Abs: 3.7 10*3/uL (ref 1.7–7.7)
Neutrophils Relative %: 61 %
Platelets: 302 10*3/uL (ref 150–400)
RBC: 5.38 MIL/uL (ref 4.22–5.81)
RDW: 13 % (ref 11.5–15.5)
WBC: 6.2 10*3/uL (ref 4.0–10.5)
nRBC: 0 % (ref 0.0–0.2)

## 2019-12-01 LAB — BASIC METABOLIC PANEL
Anion gap: 14 (ref 5–15)
BUN: 17 mg/dL (ref 6–20)
CO2: 22 mmol/L (ref 22–32)
Calcium: 10 mg/dL (ref 8.9–10.3)
Chloride: 106 mmol/L (ref 98–111)
Creatinine, Ser: 0.76 mg/dL (ref 0.61–1.24)
GFR, Estimated: >60 mL/min (ref >60)
Glucose, Bld: 173 mg/dL — ABNORMAL HIGH (ref 70–99)
Potassium: 4.2 mmol/L (ref 3.5–5.1)
Sodium: 142 mmol/L (ref 135–145)

## 2019-12-01 LAB — SARS CORONAVIRUS 2 (TAT 6-24 HRS): SARS Coronavirus 2: NEGATIVE

## 2019-12-04 ENCOUNTER — Other Ambulatory Visit: Payer: Self-pay

## 2019-12-04 ENCOUNTER — Ambulatory Visit (HOSPITAL_COMMUNITY): Payer: Medicare Other | Admitting: Anesthesiology

## 2019-12-04 ENCOUNTER — Observation Stay (HOSPITAL_COMMUNITY)
Admission: RE | Admit: 2019-12-04 | Discharge: 2019-12-05 | Disposition: A | Discharge status: Home / Self Care | Payer: Medicare Other | Attending: Urology | Admitting: Urology

## 2019-12-04 ENCOUNTER — Encounter (HOSPITAL_COMMUNITY): Payer: Self-pay | Admitting: Urology

## 2019-12-04 ENCOUNTER — Encounter (HOSPITAL_COMMUNITY)
Admission: RE | Disposition: A | Discharge status: Home / Self Care | Payer: Self-pay | Source: Home / Self Care | Attending: Urology

## 2019-12-04 DIAGNOSIS — N401 Enlarged prostate with lower urinary tract symptoms: Secondary | ICD-10-CM | POA: Diagnosis not present

## 2019-12-04 DIAGNOSIS — Z7984 Long term (current) use of oral hypoglycemic drugs: Secondary | ICD-10-CM | POA: Diagnosis not present

## 2019-12-04 DIAGNOSIS — I2510 Atherosclerotic heart disease of native coronary artery without angina pectoris: Secondary | ICD-10-CM | POA: Insufficient documentation

## 2019-12-04 DIAGNOSIS — E119 Type 2 diabetes mellitus without complications: Secondary | ICD-10-CM | POA: Diagnosis not present

## 2019-12-04 DIAGNOSIS — Z79899 Other long term (current) drug therapy: Secondary | ICD-10-CM | POA: Insufficient documentation

## 2019-12-04 DIAGNOSIS — I10 Essential (primary) hypertension: Secondary | ICD-10-CM | POA: Insufficient documentation

## 2019-12-04 DIAGNOSIS — R3914 Feeling of incomplete bladder emptying: Secondary | ICD-10-CM | POA: Diagnosis not present

## 2019-12-04 DIAGNOSIS — Z7982 Long term (current) use of aspirin: Secondary | ICD-10-CM | POA: Insufficient documentation

## 2019-12-04 DIAGNOSIS — N138 Other obstructive and reflux uropathy: Secondary | ICD-10-CM | POA: Diagnosis present

## 2019-12-04 HISTORY — PX: TRANSURETHRAL RESECTION OF PROSTATE: SHX73

## 2019-12-04 HISTORY — PX: CYSTOSCOPY: SHX5120

## 2019-12-04 LAB — BASIC METABOLIC PANEL
Anion gap: 7 (ref 5–15)
BUN: 15 mg/dL (ref 6–20)
CO2: 24 mmol/L (ref 22–32)
Calcium: 9 mg/dL (ref 8.9–10.3)
Chloride: 104 mmol/L (ref 98–111)
Creatinine, Ser: 0.86 mg/dL (ref 0.61–1.24)
GFR, Estimated: >60 mL/min (ref >60)
Glucose, Bld: 227 mg/dL — ABNORMAL HIGH (ref 70–99)
Potassium: 4.2 mmol/L (ref 3.5–5.1)
Sodium: 135 mmol/L (ref 135–145)

## 2019-12-04 LAB — CBC
HCT: 44.4 % (ref 39.0–52.0)
Hemoglobin: 14.5 g/dL (ref 13.0–17.0)
MCH: 29.5 pg (ref 26.0–34.0)
MCHC: 32.7 g/dL (ref 30.0–36.0)
MCV: 90.4 fL (ref 80.0–100.0)
Platelets: 249 10*3/uL (ref 150–400)
RBC: 4.91 MIL/uL (ref 4.22–5.81)
RDW: 13 % (ref 11.5–15.5)
WBC: 8 10*3/uL (ref 4.0–10.5)
nRBC: 0 % (ref 0.0–0.2)

## 2019-12-04 LAB — GLUCOSE, CAPILLARY
Glucose-Capillary: 227 mg/dL — ABNORMAL HIGH (ref 70–99)
Glucose-Capillary: 225 mg/dL — ABNORMAL HIGH (ref 70–99)
Glucose-Capillary: 183 mg/dL — ABNORMAL HIGH (ref 70–99)

## 2019-12-04 LAB — HEMOGLOBIN A1C
Hgb A1c MFr Bld: 7.4 % — ABNORMAL HIGH (ref 4.8–5.6)
Mean Plasma Glucose: 165.68 mg/dL

## 2019-12-04 SURGERY — TURP (TRANSURETHRAL RESECTION OF PROSTATE)
Anesthesia: General

## 2019-12-04 MED ORDER — MIDAZOLAM HCL 2 MG/2ML IJ SOLN
INTRAMUSCULAR | Status: AC
  Filled 2019-12-04: qty 2

## 2019-12-04 MED ORDER — CEFAZOLIN SODIUM-DEXTROSE 2-4 GM/100ML-% IV SOLN
INTRAVENOUS | Status: AC
  Filled 2019-12-04: qty 100

## 2019-12-04 MED ORDER — PROMETHAZINE HCL 25 MG/ML IJ SOLN
6.2500 mg | INTRAMUSCULAR | Status: DC | PRN
  Administered 2019-12-04: 6.25 mg via INTRAVENOUS
  Filled 2019-12-04: qty 1

## 2019-12-04 MED ORDER — LIDOCAINE 2% (20 MG/ML) 5 ML SYRINGE
INTRAMUSCULAR | Status: AC
  Filled 2019-12-04: qty 5

## 2019-12-04 MED ORDER — SACUBITRIL-VALSARTAN 49-51 MG PO TABS
1.0000 | ORAL_TABLET | Freq: Two times a day (BID) | ORAL | Status: DC
  Administered 2019-12-04 – 2019-12-05 (×2): 1 via ORAL
  Filled 2019-12-04 (×2): qty 1

## 2019-12-04 MED ORDER — SODIUM CHLORIDE 0.9 % IV SOLN: INTRAVENOUS | Status: DC

## 2019-12-04 MED ORDER — CHLORHEXIDINE GLUCONATE 0.12 % MT SOLN
15.0000 mL | Freq: Once | OROMUCOSAL | Status: AC
  Administered 2019-12-04: 15 mL via OROMUCOSAL
  Filled 2019-12-04: qty 15

## 2019-12-04 MED ORDER — ASPIRIN EC 81 MG PO TBEC
81.0000 mg | DELAYED_RELEASE_TABLET | Freq: Every day | ORAL | Status: DC
  Administered 2019-12-04 – 2019-12-05 (×2): 81 mg via ORAL
  Filled 2019-12-04 (×2): qty 1

## 2019-12-04 MED ORDER — SPIRONOLACTONE 12.5 MG HALF TABLET
12.5000 mg | ORAL_TABLET | Freq: Every day | ORAL | Status: DC
  Filled 2019-12-04 (×2): qty 1

## 2019-12-04 MED ORDER — INSULIN ASPART 100 UNIT/ML ~~LOC~~ SOLN
0.0000 [IU] | Freq: Three times a day (TID) | SUBCUTANEOUS | Status: DC
  Administered 2019-12-05 (×2): 3 [IU] via SUBCUTANEOUS

## 2019-12-04 MED ORDER — ESCITALOPRAM OXALATE 10 MG PO TABS
10.0000 mg | ORAL_TABLET | Freq: Every day | ORAL | Status: DC
  Administered 2019-12-04 – 2019-12-05 (×2): 10 mg via ORAL
  Filled 2019-12-04 (×2): qty 1

## 2019-12-04 MED ORDER — ACETAMINOPHEN 325 MG PO TABS: 650.0000 mg | ORAL_TABLET | ORAL | Status: DC | PRN

## 2019-12-04 MED ORDER — DIPHENHYDRAMINE HCL 12.5 MG/5ML PO ELIX: 12.5000 mg | ORAL_SOLUTION | Freq: Four times a day (QID) | ORAL | Status: DC | PRN

## 2019-12-04 MED ORDER — LIDOCAINE HCL (CARDIAC) PF 50 MG/5ML IV SOSY
PREFILLED_SYRINGE | INTRAVENOUS | Status: DC | PRN
  Administered 2019-12-04: 80 mg via INTRAVENOUS

## 2019-12-04 MED ORDER — LACTATED RINGERS IV SOLN: Freq: Once | INTRAVENOUS | Status: AC

## 2019-12-04 MED ORDER — PROPOFOL 10 MG/ML IV BOLUS
INTRAVENOUS | Status: DC | PRN
  Administered 2019-12-04: 160 mg via INTRAVENOUS

## 2019-12-04 MED ORDER — SIMVASTATIN 20 MG PO TABS
40.0000 mg | ORAL_TABLET | Freq: Every day | ORAL | Status: DC
  Administered 2019-12-04: 40 mg via ORAL
  Filled 2019-12-04: qty 2

## 2019-12-04 MED ORDER — INSULIN ASPART 100 UNIT/ML ~~LOC~~ SOLN: 0.0000 [IU] | Freq: Every day | SUBCUTANEOUS | Status: DC

## 2019-12-04 MED ORDER — ZOLPIDEM TARTRATE 5 MG PO TABS: 5.0000 mg | ORAL_TABLET | Freq: Every evening | ORAL | Status: DC | PRN

## 2019-12-04 MED ORDER — MIDAZOLAM HCL 5 MG/5ML IJ SOLN
INTRAMUSCULAR | Status: DC | PRN
  Administered 2019-12-04: 2 mg via INTRAVENOUS

## 2019-12-04 MED ORDER — ESMOLOL HCL 100 MG/10ML IV SOLN
INTRAVENOUS | Status: AC
  Filled 2019-12-04: qty 10

## 2019-12-04 MED ORDER — CEFAZOLIN SODIUM-DEXTROSE 2-4 GM/100ML-% IV SOLN
2.0000 g | INTRAVENOUS | Status: AC
  Administered 2019-12-04: 2 g via INTRAVENOUS

## 2019-12-04 MED ORDER — ORAL CARE MOUTH RINSE: 15.0000 mL | Freq: Once | OROMUCOSAL | Status: AC

## 2019-12-04 MED ORDER — FENTANYL CITRATE (PF) 250 MCG/5ML IJ SOLN
INTRAMUSCULAR | Status: AC
  Filled 2019-12-04: qty 5

## 2019-12-04 MED ORDER — DIPHENHYDRAMINE HCL 50 MG/ML IJ SOLN: 12.5000 mg | Freq: Four times a day (QID) | INTRAMUSCULAR | Status: DC | PRN

## 2019-12-04 MED ORDER — FENTANYL CITRATE (PF) 100 MCG/2ML IJ SOLN
INTRAMUSCULAR | Status: DC | PRN
  Administered 2019-12-04 (×5): 50 ug via INTRAVENOUS

## 2019-12-04 MED ORDER — WATER FOR IRRIGATION, STERILE IR SOLN
Status: DC | PRN
  Administered 2019-12-04: 500 mL

## 2019-12-04 MED ORDER — SODIUM CHLORIDE 0.9 % IR SOLN
3000.0000 mL | Status: DC
  Administered 2019-12-04: 3000 mL

## 2019-12-04 MED ORDER — METOPROLOL TARTRATE 5 MG/5ML IV SOLN
INTRAVENOUS | Status: DC | PRN
  Administered 2019-12-04: 3 mg via INTRAVENOUS

## 2019-12-04 MED ORDER — CARVEDILOL 3.125 MG PO TABS
18.7500 mg | ORAL_TABLET | Freq: Two times a day (BID) | ORAL | Status: DC
  Administered 2019-12-04 – 2019-12-05 (×2): 18.75 mg via ORAL
  Filled 2019-12-04: qty 2
  Filled 2019-12-04: qty 1

## 2019-12-04 MED ORDER — ONDANSETRON HCL 4 MG/2ML IJ SOLN
INTRAMUSCULAR | Status: DC | PRN
  Administered 2019-12-04: 4 mg via INTRAVENOUS

## 2019-12-04 MED ORDER — ONDANSETRON HCL 4 MG/2ML IJ SOLN
INTRAMUSCULAR | Status: AC
  Filled 2019-12-04: qty 2

## 2019-12-04 MED ORDER — SUCCINYLCHOLINE CHLORIDE 200 MG/10ML IV SOSY
PREFILLED_SYRINGE | INTRAVENOUS | Status: AC
  Filled 2019-12-04: qty 10

## 2019-12-04 MED ORDER — SEMAGLUTIDE 3 MG PO TABS: 3.0000 mg | ORAL_TABLET | Freq: Every day | ORAL | Status: DC

## 2019-12-04 MED ORDER — OXYCODONE-ACETAMINOPHEN 5-325 MG PO TABS
1.0000 | ORAL_TABLET | ORAL | Status: DC | PRN
  Administered 2019-12-05: 1 via ORAL
  Administered 2019-12-05: 2 via ORAL
  Filled 2019-12-04: qty 1
  Filled 2019-12-04: qty 2

## 2019-12-04 MED ORDER — SODIUM CHLORIDE 0.9 % IR SOLN
Status: DC | PRN
  Administered 2019-12-04: 16300 mL via INTRAVESICAL

## 2019-12-04 MED ORDER — MEPERIDINE HCL 50 MG/ML IJ SOLN
6.2500 mg | INTRAMUSCULAR | Status: DC | PRN
  Administered 2019-12-04: 12.5 mg via INTRAVENOUS
  Filled 2019-12-04: qty 1

## 2019-12-04 MED ORDER — HYDROMORPHONE HCL 1 MG/ML IJ SOLN
0.5000 mg | INTRAMUSCULAR | Status: DC | PRN
  Administered 2019-12-04: 1 mg via INTRAVENOUS
  Administered 2019-12-05: 0.5 mg via INTRAVENOUS
  Filled 2019-12-04: qty 0.5
  Filled 2019-12-04: qty 1

## 2019-12-04 MED ORDER — ESMOLOL HCL 100 MG/10ML IV SOLN
INTRAVENOUS | Status: DC | PRN
  Administered 2019-12-04: 50 mg via INTRAVENOUS

## 2019-12-04 MED ORDER — METOPROLOL TARTRATE 5 MG/5ML IV SOLN
INTRAVENOUS | Status: AC
  Filled 2019-12-04: qty 5

## 2019-12-04 MED ORDER — SUCCINYLCHOLINE 20MG/ML (10ML) SYRINGE FOR MEDFUSION PUMP - OPTIME
INTRAMUSCULAR | Status: DC | PRN
  Administered 2019-12-04: 120 mg via INTRAVENOUS

## 2019-12-04 MED ORDER — EPHEDRINE SULFATE 50 MG/ML IJ SOLN
INTRAMUSCULAR | Status: DC | PRN
  Administered 2019-12-04: 10 mg via INTRAVENOUS

## 2019-12-04 MED ORDER — HYDROMORPHONE HCL 1 MG/ML IJ SOLN
0.2500 mg | INTRAMUSCULAR | Status: DC | PRN
  Administered 2019-12-04 (×2): 0.5 mg via INTRAVENOUS
  Filled 2019-12-04 (×2): qty 0.5

## 2019-12-04 MED ORDER — ONDANSETRON HCL 4 MG/2ML IJ SOLN
4.0000 mg | INTRAMUSCULAR | Status: DC | PRN
  Administered 2019-12-04 – 2019-12-05 (×3): 4 mg via INTRAVENOUS
  Filled 2019-12-04 (×3): qty 2

## 2019-12-04 MED ORDER — CHLORHEXIDINE GLUCONATE CLOTH 2 % EX PADS: 6.0000 | MEDICATED_PAD | Freq: Every day | CUTANEOUS | Status: DC

## 2019-12-04 MED ORDER — PROPOFOL 10 MG/ML IV BOLUS
INTRAVENOUS | Status: AC
  Filled 2019-12-04: qty 40

## 2019-12-04 SURGICAL SUPPLY — 27 items
BAG DRAIN URO TABLE W/ADPT NS (BAG) ×2 IMPLANT
BAG DRN 8 ADPR NS SKTRN CSTL (BAG) ×1
BAG DRN URN TUBE DRIP CHMBR (OSTOMY) ×1
BAG HAMPER (MISCELLANEOUS) ×2 IMPLANT
BAG URINE DRAIN TURP 4L (OSTOMY) ×2 IMPLANT
CATH FOLEY 3WAY 30CC 22F (CATHETERS) ×2 IMPLANT
CLOTH BEACON ORANGE TIMEOUT ST (SAFETY) ×2 IMPLANT
ELECT REM PT RETURN 9FT ADLT (ELECTROSURGICAL) ×2
ELECTRODE REM PT RTRN 9FT ADLT (ELECTROSURGICAL) ×1 IMPLANT
GLOVE BIO SURGEON STRL SZ8 (GLOVE) ×2 IMPLANT
GLOVE BIOGEL PI IND STRL 7.0 (GLOVE) ×2 IMPLANT
GLOVE BIOGEL PI INDICATOR 7.0 (GLOVE) ×2
GOWN STRL REUS W/TWL LRG LVL3 (GOWN DISPOSABLE) ×4 IMPLANT
GOWN STRL REUS W/TWL XL LVL3 (GOWN DISPOSABLE) ×2 IMPLANT
IV NS IRRIG 3000ML ARTHROMATIC (IV SOLUTION) ×18 IMPLANT
KIT TURNOVER CYSTO (KITS) ×2 IMPLANT
LOOP CUT BIPOLAR 24F LRG (ELECTROSURGICAL) ×4 IMPLANT
MANIFOLD NEPTUNE II (INSTRUMENTS) ×2 IMPLANT
PACK CYSTO (CUSTOM PROCEDURE TRAY) ×2 IMPLANT
PAD ARMBOARD 7.5X6 YLW CONV (MISCELLANEOUS) ×2 IMPLANT
SYR 30ML LL (SYRINGE) ×2 IMPLANT
SYR TOOMEY IRRIG 70ML (MISCELLANEOUS) ×2
SYRINGE TOOMEY IRRIG 70ML (MISCELLANEOUS) ×1 IMPLANT
TOWEL NATURAL 4PK STERILE (DISPOSABLE) ×2 IMPLANT
TOWEL OR 17X26 4PK STRL BLUE (TOWEL DISPOSABLE) ×2 IMPLANT
WATER STERILE IRR 3000ML UROMA (IV SOLUTION) ×2 IMPLANT
WATER STERILE IRR 500ML POUR (IV SOLUTION) ×2 IMPLANT

## 2019-12-04 NOTE — Anesthesia Preprocedure Evaluation (Addendum)
Anesthesia Evaluation  Patient identified by MRN, date of birth, ID band Patient awake    Reviewed: Allergy & Precautions, NPO status , Patient's Chart, lab work & pertinent test results  History of Anesthesia Complications Negative for: history of anesthetic complications  Airway Mallampati: III  TM Distance: >3 FB Neck ROM: Full    Dental  (+) Dental Advisory Given, Loose,    Pulmonary sleep apnea ,    Pulmonary exam normal breath sounds clear to auscultation       Cardiovascular Exercise Tolerance: Poor hypertension, Pt. on medications + angina + CAD, + Past MI and + Cardiac Stents  Normal cardiovascular exam Rhythm:Regular Rate:Tachycardia - Systolic murmurs, - Diastolic murmurs, - Friction Rub, - Carotid Bruit, - Peripheral Edema and - Systolic Click a. Previous EF 33-35% 2012, b. improved to 45-50% by echo 06/2011   Mid RCA-2 lesion, 30 %stenosed.  Mid RCA-1 lesion, 30 %stenosed.  Ost Ramus to Ramus lesion, 20 %stenosed.  Ost LAD to Prox LAD lesion, 10 %stenosed.  Prox LAD to Mid LAD lesion, 60 %stenosed.  A STENT SYNERGY DES 2.25X12 drug eluting stent was successfully placed.  2nd Diag lesion, 99 %stenosed.  Post intervention, there is a 0% residual stenosis.   1. Patent stent proximal LAD 2. Moderate stenosis mid LAD. FFR was 0.84 suggesting the stenosis was not flow limiting.  3. The Diagonal branch is a moderate caliber vessel with a 99% stenosis.  4. Successful PTCA/DES x 1 Diagonal branch 5. Mild disease RCA  Recommendations: Will continue ASA and Plavix for now. He will need to be started on coumadin tomorrow before discharge for his LV thrombus. He can stop ASA when his INR is therapeutic on coumadin since he was not an ACS. I would plan long term coumadin for LV thrombus and Plavix for at least one year given placement of DES. Continue statin and beta blocker.     Neuro/Psych PSYCHIATRIC DISORDERS  Anxiety Depression    GI/Hepatic negative GI ROS, Elevated LFTs   Endo/Other  diabetes, Well Controlled, Type 2, Oral Hypoglycemic Agents  Renal/GU negative Renal ROS     Musculoskeletal  (+) Arthritis ,   Abdominal   Peds  Hematology   Anesthesia Other Findings 1. Left ventricular ejection fraction, by estimation, is 35%. The left  ventricle has moderately decreased function. The left ventricle  demonstrates regional wall motion abnormalities (see scoring  diagram/findings for description). The left ventricular  internal cavity size was mildly dilated. Left ventricular diastolic  parameters are consistent with Grade I diastolic dysfunction (impaired  relaxation). Elevated left atrial pressure.  2. Right ventricular systolic function is normal. The right ventricular  size is normal. There is normal pulmonary artery systolic pressure.  3. The mitral valve is normal in structure. Trivial mitral valve  regurgitation. No evidence of mitral stenosis.  4. The aortic valve is tricuspid. Aortic valve regurgitation is not  visualized. No aortic stenosis is present.  5. The inferior vena cava is normal in size with greater than 50%  respiratory variability, suggesting right atrial pressure of 3 mmHg.   Comparison(s): Echocardiogram done 03/03/17 showed an EF of 35-40%.    No diagnostic ST segment changes to indicate ischemia.  Large, dense, anterior and anteroseptal defect extending from apex to base consistent with infarct scar.  This is a high risk study.  Nuclear stress EF: 22%. Consistent with ischemic cardiomyopathy and large anterior/anteroseptal infarct scar, no significant ischemia.    Reproductive/Obstetrics  Anesthesia Physical Anesthesia Plan  ASA: IV  Anesthesia Plan: General   Post-op Pain Management:    Induction: Intravenous  PONV Risk Score and Plan: 4 or greater and Ondansetron, Dexamethasone and  Midazolam  Airway Management Planned: Oral ETT  Additional Equipment:   Intra-op Plan:   Post-operative Plan: Extubation in OR  Informed Consent: I have reviewed the patients History and Physical, chart, labs and discussed the procedure including the risks, benefits and alternatives for the proposed anesthesia with the patient or authorized representative who has indicated his/her understanding and acceptance.     Dental advisory given  Plan Discussed with: CRNA and Surgeon  Anesthesia Plan Comments:         Anesthesia Quick Evaluation

## 2019-12-04 NOTE — H&P (Signed)
Incomplete emptying  HPI: [name] [name] is a 53yo here for followup fo incomplete emptying. His PVR is 555cc. He is on rapaflo 8mg  daily. He has nocturnal enuresis. He has urinary frequency every 30-60 minutes. He is unhappy with his medication regiment   PMH:     Past Medical History:  Diagnosis Date  . Anxiety   . Arthritis    in lower back  . Coronary artery disease    a. 02/2008: anterior STEMI s/p DES to prox LAD, b. Myoview 2012: scar in the anterior wall with mild ischemia and EF 33%, c. neg GXT 06/2011  d. 08/2016: cath with DES to diagonal   . Depression with anxiety   . Diabetes mellitus    TYPE II  . Fatty liver    H/O ELEVATED LIVER ENZYMES  . High cholesterol   . Hyperlipidemia   . Hypertension   . Ischemic cardiomyopathy    a. Previous EF 33-35% 2012, b. improved to 45-50% by echo 06/2011  . LV (left ventricular) mural thrombus without MI (HCC)   . Noncompliance    Prior hx of med noncompliance  . OSA (obstructive sleep apnea)   . Sleep apnea     Surgical History:      Past Surgical History:  Procedure Laterality Date  . CARDIAC CATHETERIZATION    . CORONARY STENT INTERVENTION N/A 08/21/2016   Procedure: CORONARY STENT INTERVENTION;  Surgeon: 10/21/2016, MD;  Location: [facility];  Service: Cardiovascular;  Laterality: N/A;  . INTRAVASCULAR PRESSURE WIRE/FFR STUDY N/A 08/21/2016   Procedure: INTRAVASCULAR PRESSURE WIRE/FFR STUDY;  Surgeon: 10/21/2016, MD;  Location: [facility];  Service: Cardiovascular;  Laterality: N/A;  . LIVER BIOPSY    . MI WITH STENTS    . NASAL SEPTOPLASTY W/ TURBINOPLASTY N/A 01/25/2019   Procedure: BILATERAL TURBINATE REDUCTION;  Surgeon: 03/25/2019, MD;  Location: [facility] OR;  Service: ENT;  Laterality: N/A;  . NASAL SINUS SURGERY    . RIGHT/LEFT HEART CATH AND CORONARY ANGIOGRAPHY N/A 08/21/2016   Procedure: Right/Left Heart Cath and Coronary Angiography;  Surgeon: 10/21/2016, MD;  Location: [facility] INVASIVE CV LAB;  Service: Cardiovascular;  Laterality: N/A;    Home Medications:  Allergies as of 10/30/2019   No Known Allergies        Medication List       Accurate as of October 30, 2019  9:18 AM. If you have any questions, ask your nurse or doctor.        alfuzosin 10 MG 24 hr tablet Commonly known as: UROXATRAL TAKE 1 TABLET BY MOUTH AT BEDTIME   aspirin EC 81 MG tablet Take 81 mg by mouth daily.   carvedilol 12.5 MG tablet Commonly known as: COREG Take 1.5 tablets (18.75 mg total) by mouth 2 (two) times daily with a meal.   escitalopram 10 MG tablet Commonly known as: LEXAPRO Take 10 mg by mouth daily.   glipiZIDE 10 MG 24 hr tablet Commonly known as: GLUCOTROL XL Take 10 mg by mouth daily.   Jardiance 25 MG Tabs tablet Generic drug: empagliflozin Take 25 mg by mouth daily.   nitroGLYCERIN 0.4 MG SL tablet Commonly known as: NITROSTAT Place 0.4 mg under the tongue every 5 (five) minutes as needed for chest pain.   Rybelsus 3 MG Tabs Generic drug: Semaglutide Take 3 mg by mouth daily.   sacubitril-valsartan 24-26 MG Commonly known as: ENTRESTO Take 1 tablet by mouth 2 (two) times daily.  sildenafil 20 MG tablet Commonly known as: REVATIO Take 20 mg by mouth daily as needed.   silodosin 8 MG Caps capsule Commonly known as: RAPAFLO Take 1 capsule (8 mg total) by mouth daily with breakfast.   simvastatin 40 MG tablet Commonly known as: ZOCOR Take 1 tablet (40 mg total) by mouth at bedtime.   spironolactone 25 MG tablet Commonly known as: ALDACTONE Take 0.5 tablets (12.5 mg total) by mouth daily.   sulfamethoxazole-trimethoprim 800-160 MG tablet Commonly known as: BACTRIM DS Take 1 tablet by mouth 2 (two) times daily.   tadalafil 20 MG tablet Commonly known as: CIALIS Take 1 tablet (20 mg total) by mouth daily as needed for erectile dysfunction.       Allergies: No Known  Allergies  Family History:      Family History  Problem Relation Age of Onset  . Coronary artery disease Other        UNKNOWN  . Heart disease Father        CAD s/p CABG  . Diabetes Mellitus II Father   . Diabetes Mellitus II Mother   . Cancer Brother     Social History:  reports that he has never smoked. He has never used smokeless tobacco. He reports that he does not drink alcohol and does not use drugs.  ROS: All other review of systems were reviewed and are negative except what is noted above in HPI  Physical Exam: BP 134/90   Pulse 86   Temp 99 F (37.2 C)   Constitutional:  Alert and oriented, No acute distress. HEENT: [place] AT, moist mucus membranes.  Trachea midline, no masses. Cardiovascular: No clubbing, cyanosis, or edema. Respiratory: Normal respiratory effort, no increased work of breathing. GI: Abdomen is soft, nontender, nondistended, no abdominal masses GU: No CVA tenderness.  Lymph: No cervical or inguinal lymphadenopathy. Skin: No rashes, bruises or suspicious lesions. Neurologic: Grossly intact, no focal deficits, moving all 4 extremities. Psychiatric: Normal mood and affect.  Laboratory Data: Recent Labs       Lab Results  Component Value Date   WBC 7.3 01/18/2019   HGB 16.6 01/18/2019   HCT 49.6 01/18/2019   MCV 88.7 01/18/2019   PLT 287 01/18/2019      Recent Labs       Lab Results  Component Value Date   CREATININE 0.93 06/30/2019      Recent Labs       Lab Results  Component Value Date   PSA 0.5 07/12/2019      Recent Labs  No results found for: TESTOSTERONE    Recent Labs       Lab Results  Component Value Date   HGBA1C 7.8 (H) 01/18/2019      Urinalysis Labs (Brief)          Component Value Date/Time   COLORURINE AMBER (A) 05/19/2019 1600   APPEARANCEUR HAZY (A) 05/19/2019 1600   LABSPEC 1.031 (H) 05/19/2019 1600   PHURINE 6.0 05/19/2019 1600   GLUCOSEU >=500 (A) 05/19/2019  1600   HGBUR LARGE (A) 05/19/2019 1600   BILIRUBINUR negative 09/29/2019 0910   KETONESUR 20 (A) 05/19/2019 1600   PROTEINUR Negative 09/29/2019 0910   PROTEINUR >=300 (A) 05/19/2019 1600   UROBILINOGEN 0.2 09/29/2019 0910   NITRITE negative 09/29/2019 0910   NITRITE NEGATIVE 05/19/2019 1600   LEUKOCYTESUR Negative 09/29/2019 0910   LEUKOCYTESUR NEGATIVE 05/19/2019 1600      Recent Labs       Lab Results  Component Value Date   BACTERIA FEW (A) 05/19/2019      Pertinent Imaging:  No results found for this or any previous visit.  No results found for this or any previous visit.  No results found for this or any previous visit.  No results found for this or any previous visit.  No results found for this or any previous visit.  No results found for this or any previous visit.  No results found for this or any previous visit.  No results found for this or any previous visit.   Assessment & Plan:    1. Benign prostatic hyperplasia with urinary obstruction -We discussed the treatment options including medical therapy, TURP, Urolift, Rezum, and simple prostatectomy. AFter discussing the options the patient elects for TURP. Risks/benefits/alternatives discussed

## 2019-12-04 NOTE — Anesthesia Postprocedure Evaluation (Signed)
Anesthesia Post Note  Patient: [name] [name].  Procedure(s) Performed: TRANSURETHRAL RESECTION OF THE PROSTATE (TURP) (N/A ) CYSTOSCOPY (N/A )  Patient location during evaluation: PACU Anesthesia Type: General Level of consciousness: awake and alert and oriented Pain management: pain level controlled Vital Signs Assessment: post-procedure vital signs reviewed and stable Respiratory status: spontaneous breathing Cardiovascular status: stable and blood pressure returned to baseline Postop Assessment: no apparent nausea or vomiting Anesthetic complications: no   No complications documented.   Last Vitals:  Vitals:   12/04/19 1200 12/04/19 1215  BP: 116/82 123/74  Pulse: 79 76  Resp: 12 13  Temp:    SpO2: 93% 95%    Last Pain:  Vitals:   12/04/19 1208  TempSrc:   PainSc: 6                  Mehki Klumpp

## 2019-12-04 NOTE — Transfer of Care (Signed)
Immediate Anesthesia Transfer of Care Note  Patient: [name] [name].  Procedure(s) Performed: TRANSURETHRAL RESECTION OF THE PROSTATE (TURP) (N/A ) CYSTOSCOPY (N/A )  Patient Location: PACU  Anesthesia Type:General  Level of Consciousness: awake  Airway & Oxygen Therapy: Patient Spontanous Breathing  Post-op Assessment: Report given to RN  Post vital signs: Reviewed and stable  Last Vitals:  Vitals Value Taken Time  BP 121/83 12/04/19 1117  Temp    Pulse 87 12/04/19 1125  Resp 15 12/04/19 1125  SpO2 94 % 12/04/19 1125  Vitals shown include unvalidated device data.  Last Pain:  Vitals:   12/04/19 0929  TempSrc: Oral  PainSc: 0-No pain      Patients Stated Pain Goal: 5 (12/04/19 0929)  Complications: No complications documented.

## 2019-12-04 NOTE — Anesthesia Procedure Notes (Addendum)
Procedure Name: Intubation Date/Time: 12/04/2019 10:04 AM Performed by: Moshe Salisbury, CRNA Pre-anesthesia Checklist: Patient identified, Patient being monitored, Timeout performed, Emergency Drugs available and Suction available Patient Re-evaluated:Patient Re-evaluated prior to induction Oxygen Delivery Method: Circle system utilized Preoxygenation: Pre-oxygenation with 100% oxygen Induction Type: IV induction Ventilation: Mask ventilation without difficulty Laryngoscope Size: Glidescope (S4) Grade View: Grade I Tube type: Oral Tube size: 7.5 mm Number of attempts: 1 Airway Equipment and Method: Rigid stylet Placement Confirmation: ETT inserted through vocal cords under direct vision,  positive ETCO2 and breath sounds checked- equal and bilateral Secured at: 22 cm Tube secured with: Tape Dental Injury: Teeth and Oropharynx as per pre-operative assessment

## 2019-12-04 NOTE — Anesthesia Procedure Notes (Deleted)
Procedure Name: Intubation Date/Time: 12/04/2019 10:04 AM Performed by: Moshe Salisbury, CRNA Pre-anesthesia Checklist: Patient identified, Patient being monitored, Timeout performed, Emergency Drugs available and Suction available Patient Re-evaluated:Patient Re-evaluated prior to induction Oxygen Delivery Method: Circle system utilized Preoxygenation: Pre-oxygenation with 100% oxygen Induction Type: IV induction Ventilation: Mask ventilation without difficulty Laryngoscope Size: Glidescope (S4) Grade View: Grade I Tube type: Oral Tube size: 7.5 mm Number of attempts: 1 Airway Equipment and Method: Rigid stylet and Video-laryngoscopy Placement Confirmation: ETT inserted through vocal cords under direct vision,  positive ETCO2 and breath sounds checked- equal and bilateral Secured at: 22 cm Tube secured with: Tape Dental Injury: Teeth and Oropharynx as per pre-operative assessment

## 2019-12-04 NOTE — Op Note (Signed)
Preoperative diagnosis: BPH with incomplete bladder emptying  Postoperative diagnosis: BPH  Procedure: 1 cystoscopy 2. Transurethral resection of the prostate  Attending: Wilkie Aye  Anesthesia: General  Estimated blood loss: Minimal  Drains: 22 French foley  Specimens: 1. Prostate Chips  Antibiotics: Rocephin  Findings: Trilobar prostate enlargement. Ureteral orifices in normal anatomic location.   Indications: Patient is a 54 year old male with a history of BPH and elevated PVR.  After discussing treatment options, they decided proceed with transurethral resection of the prostate.  Procedure her in detail: The patient was brought to the operating room and a brief timeout was done to ensure correct patient, correct procedure, correct site.  General anesthesia was administered patient was placed in dorsal lithotomy position.  Their genitalia was then prepped and draped in usual sterile fashion.  A rigid 22 French cystoscope was passed in the urethra and the bladder.  Bladder was inspected and we noted no masses or lesions.  the ureteral orifices were in the normal orthotopic locations. removed the cystoscope and placed a resectoscope into the bladder. We then turned our attention to the prostate resection. Using the bipolar resectoscope we resected the median lobe first from the bladder neck to the verumontanum. We then started at the 12 oclock position on the left lobe and resection to the 6 o'clock position from the bladder neck to the verumontanum. We then did the same resection of the right lobe. Once the resection was complete we then cauterized individual bleeders. We then removed the prostate chips and sent them for pathology.  We then re-inspected the prostatic fossa and found no residual bleeding.  the bladder was then drained, a 22 French foley was placed and this concluded the procedure which was well tolerated by patient.  Complications: None  Condition: Stable,  extubated, transferred to PACU  Plan: Patient is admitted overnight with continuous bladder irrigation. If their urine is clear tomorrow they will be discharged home and followup in 5 days for foley catheter removal and pathology discussion.

## 2019-12-05 ENCOUNTER — Encounter (HOSPITAL_COMMUNITY): Payer: Self-pay | Admitting: Urology

## 2019-12-05 DIAGNOSIS — N401 Enlarged prostate with lower urinary tract symptoms: Secondary | ICD-10-CM | POA: Diagnosis not present

## 2019-12-05 LAB — CBC
HCT: 44.4 % (ref 39.0–52.0)
Hemoglobin: 14.2 g/dL (ref 13.0–17.0)
MCH: 29.3 pg (ref 26.0–34.0)
MCHC: 32 g/dL (ref 30.0–36.0)
MCV: 91.5 fL (ref 80.0–100.0)
Platelets: 247 10*3/uL (ref 150–400)
RBC: 4.85 MIL/uL (ref 4.22–5.81)
RDW: 12.9 % (ref 11.5–15.5)
WBC: 10.2 10*3/uL (ref 4.0–10.5)
nRBC: 0 % (ref 0.0–0.2)

## 2019-12-05 LAB — BASIC METABOLIC PANEL
Anion gap: 7 (ref 5–15)
BUN: 17 mg/dL (ref 6–20)
CO2: 25 mmol/L (ref 22–32)
Calcium: 8.7 mg/dL — ABNORMAL LOW (ref 8.9–10.3)
Chloride: 101 mmol/L (ref 98–111)
Creatinine, Ser: 0.72 mg/dL (ref 0.61–1.24)
GFR, Estimated: >60 mL/min (ref >60)
Glucose, Bld: 214 mg/dL — ABNORMAL HIGH (ref 70–99)
Potassium: 3.8 mmol/L (ref 3.5–5.1)
Sodium: 133 mmol/L — ABNORMAL LOW (ref 135–145)

## 2019-12-05 LAB — GLUCOSE, CAPILLARY
Glucose-Capillary: 190 mg/dL — ABNORMAL HIGH (ref 70–99)
Glucose-Capillary: 194 mg/dL — ABNORMAL HIGH (ref 70–99)

## 2019-12-05 MED ORDER — OXYCODONE-ACETAMINOPHEN 5-325 MG PO TABS
1.0000 | ORAL_TABLET | ORAL | 0 refills | Status: AC | PRN
Start: 2019-12-05 — End: 2020-12-04

## 2019-12-05 MED ORDER — SODIUM CHLORIDE FLUSH 0.9 % IV SOLN
INTRAVENOUS | Status: AC
  Filled 2019-12-05: qty 10

## 2019-12-05 NOTE — Discharge Instructions (Signed)
Transurethral Resection of the Prostate, Care After This sheet gives you information about how to care for yourself after your procedure. Your health care provider may also give you more specific instructions. If you have problems or questions, contact your health care provider. What can I expect after the procedure? After the procedure, it is common to have:  Mild pain in your lower abdomen.  Soreness or mild discomfort in your penis from having the catheter inserted during the procedure.  A feeling of urgency when you need to urinate.  A small amount of blood in your urine. You may notice some small blood clots in your urine. These are normal. Follow these instructions at home: Medicines  Take over-the-counter and prescription medicines only as told by your health care provider.  If you were prescribed an antibiotic medicine, take it as told by your health care provider. Do not stop taking the antibiotic even if you start to feel better.  Ask your health care provider if the medicine prescribed to you: ? Requires you to avoid driving or using heavy machinery. ? Can cause constipation. You may need to take actions to prevent or treat constipation, such as:  Take over-the-counter or prescription medicines.  Eat foods that are high in fiber, such as fresh fruits and vegetables, whole grains, and beans.  Limit foods that are high in fat and processed sugars, such as fried or sweet foods.  Do not drive for 24 hours if you were given a sedative during your procedure. Activity   Return to your normal activities as told by your health care provider. Ask your health care provider what activities are safe for you.  Do not lift anything that is heavier than 10 lb (4.5 kg), or the limit that you are told, for 3 weeks after the procedure or until your health care provider says that it is safe.  Avoid intense physical activity for as long as told by your health care provider.  Avoid  sitting for a long time without moving. Get up and move around one or more times every few hours. This helps to prevent blood clots. You may increase your physical activity gradually as you start to feel better. Lifestyle  Do not drink alcohol for as long as told by your health care provider. This is especially important if you are taking prescription pain medicines.  Do not engage in sexual activity until your health care provider says that you can do this. General instructions   Do not take baths, swim, or use a hot tub until your health care provider approves.  Drink enough fluid to keep your urine pale yellow.  Urinate as soon as you feel the need to. Do not try to hold your urine for long periods of time.  If your health care provider approves, you may take a stool softener for 2-3 weeks to prevent you from straining to have a bowel movement.  Wear compression stockings as told by your health care provider. These stockings help to prevent blood clots and reduce swelling in your legs.  Keep all follow-up visits as told by your health care provider. This is important. Contact a health care provider if you have:  Difficulty urinating.  A fever.  Pain that gets worse or does not improve with medicine.  Blood in your urine that does not go away after 1 week of resting and drinking more fluids.  Swelling in your penis or testicles. Get help right away if:  You are unable   to urinate.  You are having more blood clots in your urine instead of fewer.  You have: ? Large blood clots. ? A lot of blood in your urine. ? Pain in your back or lower abdomen. ? Pain or swelling in your legs. ? Chills and you are shaking. ? Difficulty breathing or shortness of breath. Summary  After the procedure, it is common to have a small amount of blood in your urine.  Avoid heavy lifting and intense physical activity for as long as told by your health care provider.  Urinate as soon as you  feel the need to. Do not try to hold your urine for long periods of time.  Keep all follow-up visits as told by your health care provider. This is important. This information is not intended to replace advice given to you by your health care provider. Make sure you discuss any questions you have with your health care provider. Document Revised: 04/27/2018 Document Reviewed: 10/06/2017 Elsevier Patient Education  2020 [employer]  

## 2019-12-05 NOTE — Care Management Obs Status (Signed)
MEDICARE OBSERVATION STATUS NOTIFICATION   Patient Details  Name: [name] [name]. MRN: [id number] Date of Birth: [birth year]   Medicare Observation Status Notification Given:  Yes    Corey Harold 12/05/2019, 3:27 PM

## 2019-12-06 LAB — SURGICAL PATHOLOGY

## 2019-12-07 ENCOUNTER — Telehealth: Payer: Self-pay

## 2019-12-07 NOTE — Progress Notes (Signed)
Post -Op call done. Pt mentioned he was having bladder spasm pain, called Dr. Dimas Millin office, has not heard back. This RN called Dr. Dimas Millin office on behalf of patient talked with the nurse and told her the concerns . She said she will relay the message to Dr. Also gave her the name of pt's pharmacy. I called pt back to make him aware I had spoken to the nurse.

## 2019-12-07 NOTE — Telephone Encounter (Signed)
A nurse from [facility] called and asked if Dr. Ronne Binning could rx something for bladder spasms for this pt. Talked with another nurse and she had talked with pt earlier and explained Dr. Ronne Binning had said he did not want to give him anything d/t his urination.

## 2019-12-11 ENCOUNTER — Ambulatory Visit (INDEPENDENT_AMBULATORY_CARE_PROVIDER_SITE_OTHER): Payer: Medicare Other | Admitting: Urology

## 2019-12-11 ENCOUNTER — Other Ambulatory Visit: Payer: Self-pay

## 2019-12-11 ENCOUNTER — Encounter: Payer: Self-pay | Admitting: Urology

## 2019-12-11 VITALS — BP 140/99 | HR 108 | Temp 99.3°F | Ht 70.0 in | Wt 230.0 lb

## 2019-12-11 DIAGNOSIS — N138 Other obstructive and reflux uropathy: Secondary | ICD-10-CM

## 2019-12-11 DIAGNOSIS — R339 Retention of urine, unspecified: Secondary | ICD-10-CM

## 2019-12-11 DIAGNOSIS — N401 Enlarged prostate with lower urinary tract symptoms: Secondary | ICD-10-CM

## 2019-12-11 NOTE — Progress Notes (Signed)
12/11/2019 11:04 AM   [name] [name]. 01/04/1966 [id number]  Referring provider: Rebekah Chesterfield, NP [address] [place],  [place] [address]  followup BPH  HPI: [name] [name] is a 54yo here for followup for BPH. He underwent TURP last week. Urine clear. Patient passed voiding trial today. He was having bladder spasms.   PMH: Past Medical History:  Diagnosis Date  . Anxiety   . Arthritis    in lower back  . Coronary artery disease    a. 02/2008: anterior STEMI s/p DES to prox LAD, b. Myoview 2012: scar in the anterior wall with mild ischemia and EF 33%, c. neg GXT 06/2011  d. 08/2016: cath with DES to diagonal   . Depression with anxiety   . Diabetes mellitus    TYPE II  . Fatty liver    H/O ELEVATED LIVER ENZYMES  . High cholesterol   . Hyperlipidemia   . Hypertension   . Ischemic cardiomyopathy    a. Previous EF 33-35% 2012, b. improved to 45-50% by echo 06/2011  . LV (left ventricular) mural thrombus without MI (HCC)   . Myocardial infarction (HCC)   . Noncompliance    Prior hx of med noncompliance  . OSA (obstructive sleep apnea)   . Sleep apnea     Surgical History: Past Surgical History:  Procedure Laterality Date  . CARDIAC CATHETERIZATION    . CORONARY ANGIOPLASTY    . CORONARY STENT INTERVENTION N/A 08/21/2016   Procedure: CORONARY STENT INTERVENTION;  Surgeon: Kathleene Hazel, MD;  Location: [facility];  Service: Cardiovascular;  Laterality: N/A;  . CYSTOSCOPY N/A 12/04/2019   Procedure: CYSTOSCOPY;  Surgeon: Malen Gauze, MD;  Location: [facility];  Service: Urology;  Laterality: N/A;  . INTRAVASCULAR PRESSURE WIRE/FFR STUDY N/A 08/21/2016   Procedure: INTRAVASCULAR PRESSURE WIRE/FFR STUDY;  Surgeon: Kathleene Hazel, MD;  Location: [facility];  Service: Cardiovascular;  Laterality: N/A;  . LIVER BIOPSY    . MI WITH STENTS    . NASAL SEPTOPLASTY W/ TURBINOPLASTY N/A 01/25/2019   Procedure: BILATERAL TURBINATE REDUCTION;   Surgeon: Newman Pies, MD;  Location: [facility] OR;  Service: ENT;  Laterality: N/A;  . NASAL SINUS SURGERY    . RIGHT/LEFT HEART CATH AND CORONARY ANGIOGRAPHY N/A 08/21/2016   Procedure: Right/Left Heart Cath and Coronary Angiography;  Surgeon: Kathleene Hazel, MD;  Location: [facility] INVASIVE CV LAB;  Service: Cardiovascular;  Laterality: N/A;  . TRANSURETHRAL RESECTION OF PROSTATE N/A 12/04/2019   Procedure: TRANSURETHRAL RESECTION OF THE PROSTATE (TURP);  Surgeon: Malen Gauze, MD;  Location: [facility];  Service: Urology;  Laterality: N/A;    Home Medications:  Allergies as of 12/11/2019   No Known Allergies     Medication List       Accurate as of December 11, 2019 11:04 AM. If you have any questions, ask your nurse or doctor.        aspirin EC 81 MG tablet Take 81 mg by mouth daily.   carvedilol 12.5 MG tablet Commonly known as: COREG Take 1.5 tablets (18.75 mg total) by mouth 2 (two) times daily with a meal.   Entresto 49-51 MG Generic drug: sacubitril-valsartan Take 1 tablet by mouth 2 (two) times daily.   escitalopram 10 MG tablet Commonly known as: LEXAPRO Take 10 mg by mouth daily.   glipiZIDE 10 MG 24 hr tablet Commonly known as: GLUCOTROL XL Take 10 mg by mouth daily.  Jardiance 25 MG Tabs tablet Generic drug: empagliflozin Take 25 mg by mouth daily.   nitroGLYCERIN 0.4 MG SL tablet Commonly known as: NITROSTAT Place 0.4 mg under the tongue every 5 (five) minutes as needed for chest pain.   oxyCODONE-acetaminophen 5-325 MG tablet Commonly known as: Percocet Take 1 tablet by mouth every 4 (four) hours as needed for severe pain.   Rybelsus 3 MG Tabs Generic drug: Semaglutide Take 3 mg by mouth daily.   sildenafil 20 MG tablet Commonly known as: REVATIO Take 20 mg by mouth daily as needed (ED).   simvastatin 40 MG tablet Commonly known as: ZOCOR Take 1 tablet (40 mg total) by mouth at bedtime.   spironolactone 25 MG tablet Commonly known as:  ALDACTONE Take 0.5 tablets (12.5 mg total) by mouth daily.   tadalafil 20 MG tablet Commonly known as: CIALIS Take 1 tablet (20 mg total) by mouth daily as needed for erectile dysfunction.       Allergies: No Known Allergies  Family History: Family History  Problem Relation Age of Onset  . Coronary artery disease Other        UNKNOWN  . Heart disease Father        CAD s/p CABG  . Diabetes Mellitus II Father   . Diabetes Mellitus II Mother   . Cancer Brother     Social History:  reports that he has never smoked. He has never used smokeless tobacco. He reports that he does not drink alcohol and does not use drugs.  ROS: All other review of systems were reviewed and are negative except what is noted above in HPI  Physical Exam: BP (!) 140/99   Pulse (!) 108   Temp 99.3 F (37.4 C)   Ht 5\' 10"  (1.778 m)   Wt 230 lb (104.3 kg)   BMI 33.00 kg/m   Constitutional:  Alert and oriented, No acute distress. HEENT: [place] AT, moist mucus membranes.  Trachea midline, no masses. Cardiovascular: No clubbing, cyanosis, or edema. Respiratory: Normal respiratory effort, no increased work of breathing. GI: Abdomen is soft, nontender, nondistended, no abdominal masses GU: No CVA tenderness.  Lymph: No cervical or inguinal lymphadenopathy. Skin: No rashes, bruises or suspicious lesions. Neurologic: Grossly intact, no focal deficits, moving all 4 extremities. Psychiatric: Normal mood and affect.  Laboratory Data: Lab Results  Component Value Date   WBC 10.2 12/05/2019   HGB 14.2 12/05/2019   HCT 44.4 12/05/2019   MCV 91.5 12/05/2019   PLT 247 12/05/2019    Lab Results  Component Value Date   CREATININE 0.72 12/05/2019    Lab Results  Component Value Date   PSA 0.5 07/12/2019    No results found for: TESTOSTERONE  Lab Results  Component Value Date   HGBA1C 7.4 (H) 12/04/2019    Urinalysis    Component Value Date/Time   COLORURINE AMBER (A) 05/19/2019 1600    APPEARANCEUR Clear 10/30/2019 0911   LABSPEC 1.031 (H) 05/19/2019 1600   PHURINE 6.0 05/19/2019 1600   GLUCOSEU Negative 10/30/2019 0911   HGBUR LARGE (A) 05/19/2019 1600   BILIRUBINUR Negative 10/30/2019 0911   KETONESUR 20 (A) 05/19/2019 1600   PROTEINUR Negative 10/30/2019 0911   PROTEINUR >=300 (A) 05/19/2019 1600   UROBILINOGEN 0.2 09/29/2019 0910   NITRITE Negative 10/30/2019 0911   NITRITE NEGATIVE 05/19/2019 1600   LEUKOCYTESUR Negative 10/30/2019 0911   LEUKOCYTESUR NEGATIVE 05/19/2019 1600    Lab Results  Component Value Date   LABMICR Comment 10/30/2019  BACTERIA FEW (A) 05/19/2019    Pertinent Imaging:  No results found for this or any previous visit.  No results found for this or any previous visit.  No results found for this or any previous visit.  No results found for this or any previous visit.  No results found for this or any previous visit.  No results found for this or any previous visit.  No results found for this or any previous visit.  No results found for this or any previous visit.   Assessment & Plan:    1. BPH with urinary obstruction -RTC 1 week for PVR  2. Incomplete bladder emptying -RTC 1 week with PVR   No follow-ups on file.  Wilkie Aye, MD  [facility] [place]

## 2019-12-11 NOTE — Progress Notes (Signed)
Urological Symptom Review  Patient is experiencing the following symptoms: Frequent urination Hard to postpone urination Burning/pain with urination Get up at night to urinate Leakage of urine Have to strain to urinate Blood in urine Erection problems (male only) Penile pain (male only)    Review of Systems  Gastrointestinal (upper)  : Negative for upper GI symptoms  Gastrointestinal (lower) : Constipation  Constitutional : Night Sweats Fatigue  Skin: Itching  Eyes: Negative for eye symptoms  Ear/Nose/Throat : Negative for Ear/Nose/Throat symptoms  Hematologic/Lymphatic: Negative for Hematologic/Lymphatic symptoms  Cardiovascular : Negative for cardiovascular symptoms  Respiratory : Negative for respiratory symptoms  Endocrine: Excessive thirst   Musculoskeletal: Back pain Joint pain  Neurological: Headaches  Psychologic: Depression Anxiety

## 2019-12-11 NOTE — Patient Instructions (Signed)

## 2019-12-11 NOTE — Progress Notes (Signed)
Fill and Pull Catheter Removal  Patient is present today for a catheter removal.  Patient was cleaned and prepped in a sterile fashion of sterile water/ saline was instilled into the bladder when the patient felt the urge to urinate. 69ml of water was then drained from the balloon.  A 16FR foley cath was removed from the bladder no complications were noted .  Patient as then given some time to void on their own.  Patient can void  69ml on their own after some time.  Patient tolerated well.  Performed by: Sarita Haver, Markitta Ausburn<LPN

## 2019-12-19 ENCOUNTER — Other Ambulatory Visit: Payer: Self-pay

## 2019-12-19 ENCOUNTER — Ambulatory Visit (INDEPENDENT_AMBULATORY_CARE_PROVIDER_SITE_OTHER): Payer: Medicare Other

## 2019-12-19 DIAGNOSIS — N138 Other obstructive and reflux uropathy: Secondary | ICD-10-CM | POA: Diagnosis not present

## 2019-12-19 DIAGNOSIS — N401 Enlarged prostate with lower urinary tract symptoms: Secondary | ICD-10-CM | POA: Diagnosis not present

## 2019-12-19 NOTE — Progress Notes (Signed)
Bladder Scan Patient can void: 9 ml Performed By: Bridgette Habermann, LPN

## 2019-12-19 NOTE — Discharge Summary (Signed)
Physician Discharge Summary  Patient ID: [name] [name]. MRN: [id number] DOB/AGE: 1965-04-03 54 y.o.  Admit date: 12/04/2019 Discharge date: 12/05/2019  Admission Diagnoses:  BPH  Discharge Diagnoses:  Active Problems:   BPH with urinary obstruction   Past Medical History:  Diagnosis Date  . Anxiety   . Arthritis    in lower back  . Coronary artery disease    a. 02/2008: anterior STEMI s/p DES to prox LAD, b. Myoview 2012: scar in the anterior wall with mild ischemia and EF 33%, c. neg GXT 06/2011  d. 08/2016: cath with DES to diagonal   . Depression with anxiety   . Diabetes mellitus    TYPE II  . Fatty liver    H/O ELEVATED LIVER ENZYMES  . High cholesterol   . Hyperlipidemia   . Hypertension   . Ischemic cardiomyopathy    a. Previous EF 33-35% 2012, b. improved to 45-50% by echo 06/2011  . LV (left ventricular) mural thrombus without MI (HCC)   . Myocardial infarction (HCC)   . Noncompliance    Prior hx of med noncompliance  . OSA (obstructive sleep apnea)   . Sleep apnea     Surgeries: Procedure(s): TRANSURETHRAL RESECTION OF THE PROSTATE (TURP) CYSTOSCOPY on 12/04/2019   Consultants (if any):   Discharged Condition: Improved  Hospital Course: Bartley Vuolo. is an 54 y.o. male who was admitted 12/04/2019 with a diagnosis of <principal problem not specified> and went to the operating room on 12/04/2019 and underwent the above named procedures.    He was given perioperative antibiotics:  Anti-infectives (From admission, onward)   Start     Dose/Rate Route Frequency Ordered Stop   12/04/19 0919  ceFAZolin (ANCEF) IVPB 2g/100 mL premix        2 g 200 mL/hr over 30 Minutes Intravenous 30 min pre-op 12/04/19 0919 12/04/19 1003    .  He was given sequential compression devices, early ambulation, for DVT prophylaxis.  He benefited maximally from the hospital stay and there were no complications.    Recent vital signs:  Vitals:   12/05/19 0003 12/05/19 0418   BP: (!) 145/98 (!) 141/91  Pulse: 78 83  Resp: 18 18  Temp: 97.9 F (36.6 C) (!) 97.3 F (36.3 C)  SpO2: 98% 96%    Recent laboratory studies:  Lab Results  Component Value Date   HGB 14.2 12/05/2019   HGB 14.5 12/04/2019   HGB 15.9 12/01/2019   Lab Results  Component Value Date   WBC 10.2 12/05/2019   PLT 247 12/05/2019   Lab Results  Component Value Date   INR 2.5 02/16/2017   Lab Results  Component Value Date   NA 133 (L) 12/05/2019   K 3.8 12/05/2019   CL 101 12/05/2019   CO2 25 12/05/2019   BUN 17 12/05/2019   CREATININE 0.72 12/05/2019   GLUCOSE 214 (H) 12/05/2019    Discharge Medications:   Allergies as of 12/05/2019   No Known Allergies     Medication List    STOP taking these medications   alfuzosin 10 MG 24 hr tablet Commonly known as: UROXATRAL   silodosin 8 MG Caps capsule Commonly known as: RAPAFLO     TAKE these medications   aspirin EC 81 MG tablet Take 81 mg by mouth daily.   carvedilol 12.5 MG tablet Commonly known as: COREG Take 1.5 tablets (18.75 mg total) by mouth 2 (two) times daily with a meal.   Sherryll Burger  49-51 MG Generic drug: sacubitril-valsartan Take 1 tablet by mouth 2 (two) times daily.   escitalopram 10 MG tablet Commonly known as: LEXAPRO Take 10 mg by mouth daily.   glipiZIDE 10 MG 24 hr tablet Commonly known as: GLUCOTROL XL Take 10 mg by mouth daily.   Jardiance 25 MG Tabs tablet Generic drug: empagliflozin Take 25 mg by mouth daily.   nitroGLYCERIN 0.4 MG SL tablet Commonly known as: NITROSTAT Place 0.4 mg under the tongue every 5 (five) minutes as needed for chest pain.   oxyCODONE-acetaminophen 5-325 MG tablet Commonly known as: Percocet Take 1 tablet by mouth every 4 (four) hours as needed for severe pain.   Rybelsus 3 MG Tabs Generic drug: Semaglutide Take 3 mg by mouth daily.   sildenafil 20 MG tablet Commonly known as: REVATIO Take 20 mg by mouth daily as needed (ED).   simvastatin 40  MG tablet Commonly known as: ZOCOR Take 1 tablet (40 mg total) by mouth at bedtime.   spironolactone 25 MG tablet Commonly known as: ALDACTONE Take 0.5 tablets (12.5 mg total) by mouth daily.   tadalafil 20 MG tablet Commonly known as: CIALIS Take 1 tablet (20 mg total) by mouth daily as needed for erectile dysfunction.       Diagnostic Studies: No results found.  Disposition: Discharge disposition: 01-Home or Self Care       Discharge Instructions    Discharge patient   Complete by: As directed    Discharge disposition: 01-Home or Self Care   Discharge patient date: 12/05/2019       Follow-up Information    Chad Tiznado, Mardene Celeste, MD Follow up on 12/11/2019.   Specialty: Urology Why: 10:30am Contact information: [address] [place] [address] [phone]                Signed: Wilkie Aye 12/19/2019, 9:27 AM

## 2019-12-26 ENCOUNTER — Other Ambulatory Visit: Payer: Self-pay

## 2019-12-26 ENCOUNTER — Telehealth: Payer: Self-pay | Admitting: Urology

## 2019-12-26 ENCOUNTER — Ambulatory Visit: Payer: Medicare Other

## 2019-12-26 DIAGNOSIS — N401 Enlarged prostate with lower urinary tract symptoms: Secondary | ICD-10-CM

## 2019-12-26 DIAGNOSIS — N138 Other obstructive and reflux uropathy: Secondary | ICD-10-CM

## 2019-12-26 LAB — MICROSCOPIC EXAMINATION
Bacteria, UA: NONE SEEN
RBC, Urine: >30 /hpf — AB (ref 0–2)
Renal Epithel, UA: NONE SEEN /hpf

## 2019-12-26 LAB — URINALYSIS, ROUTINE W REFLEX MICROSCOPIC
Bilirubin, UA: NEGATIVE
Ketones, UA: NEGATIVE
Nitrite, UA: NEGATIVE
Protein,UA: NEGATIVE
Specific Gravity, UA: <1.005 — ABNORMAL LOW (ref 1.005–1.030)
Urobilinogen, Ur: 0.2 mg/dL (ref 0.2–1.0)
pH, UA: 5 (ref 5.0–7.5)

## 2019-12-26 NOTE — Telephone Encounter (Signed)
Pt reports urgent care would not do a culture. Pt will come by office to leave sample for culture. Pt reports small amount of blood noted at beginning or urination and spasm pain at end of sensation.

## 2019-12-26 NOTE — Addendum Note (Signed)
Addended by: Dalia Heading on: 12/26/2019 04:21 PM   Modules accepted: Orders

## 2019-12-26 NOTE — Telephone Encounter (Signed)
Patient called and LM stating he was seen at PCP this morning and they told him his urine showed possible infection. He wanted to let us know and see if he needs treatment.

## 2019-12-29 LAB — URINE CULTURE: Organism ID, Bacteria: NO GROWTH

## 2020-01-01 ENCOUNTER — Telehealth: Payer: Self-pay | Admitting: Urology

## 2020-01-01 NOTE — Telephone Encounter (Signed)
Pt notified urine was negative.

## 2020-01-01 NOTE — Telephone Encounter (Signed)
Pt called wanting his urine results. Please call when you can.

## 2020-01-07 IMAGING — MR MR LUMBAR SPINE W/O CM
4 of 5 series · 19 of 48 positions shown · non-contrast
Comparison: Lumbar spine radiograph 04/11/2016

CLINICAL DATA: Low back pain radiating to the lower extremities

EXAM:
MRI LUMBAR SPINE WITHOUT CONTRAST
TECHNIQUE: Multiplanar, multisequence MR imaging of the lumbar spine was
performed. No intravenous contrast was administered.

[Series 6: T2 · sagittal · 4.0mm · 0.73mm/px · 6 of 15 slices shown (1 of 2)]
[im 1/15]
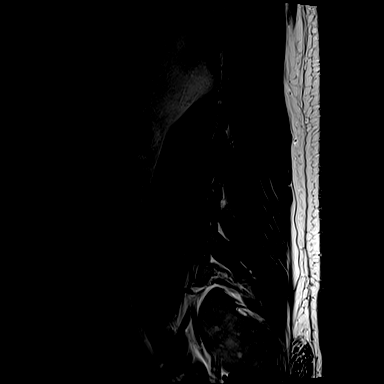
[im 3/15]
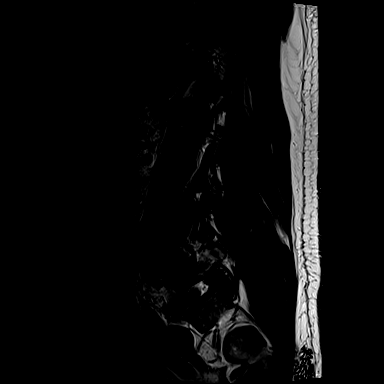
[im 6/15]
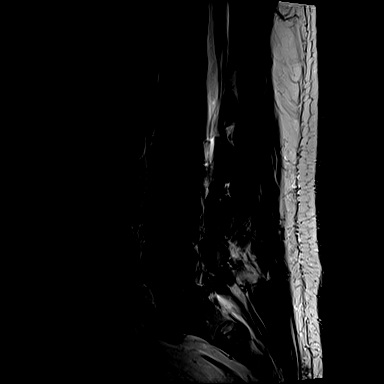
[im 9/15]
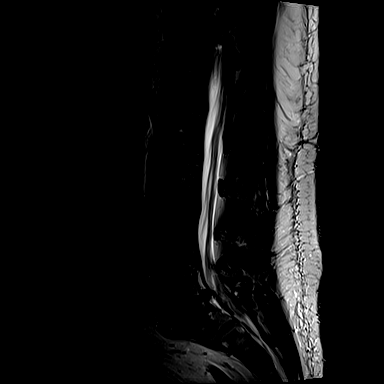
[im 12/15]
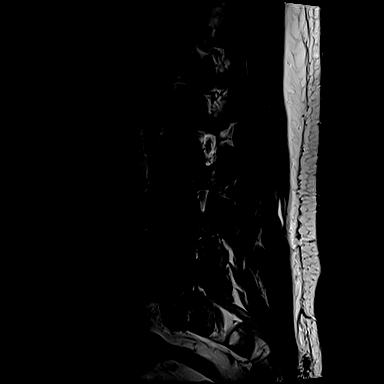
[im 15/15]
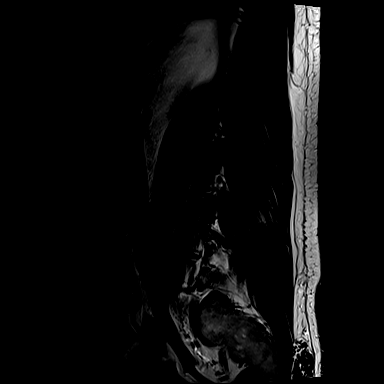

[Series 7: T1 · sagittal · 4.0mm · 0.73mm/px · 3 of 15 slices shown (1 of 2)]
[im 1/15]
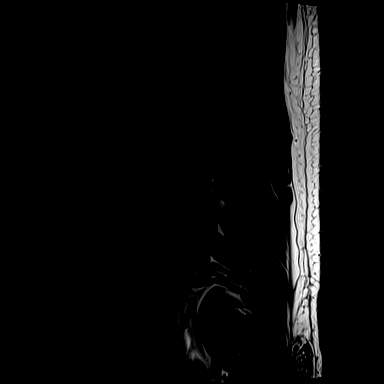
[im 8/15]
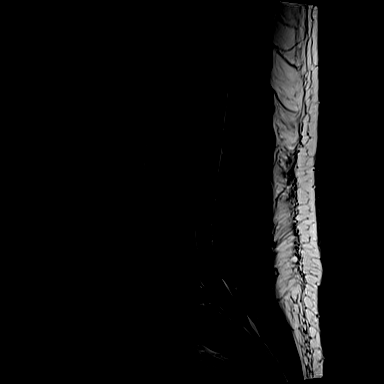
[im 15/15]
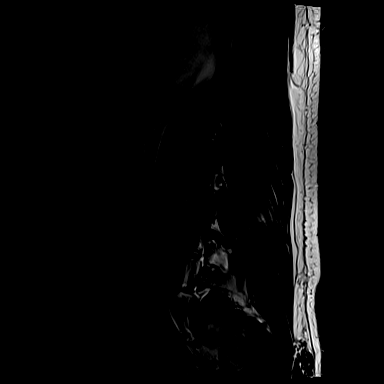

[Series 13: T2 · axial · 4.0mm · 0.28mm/px · z∈[-224,-32]mm · 7 of 43 slices shown (2 of 2)]
[im 3/43]
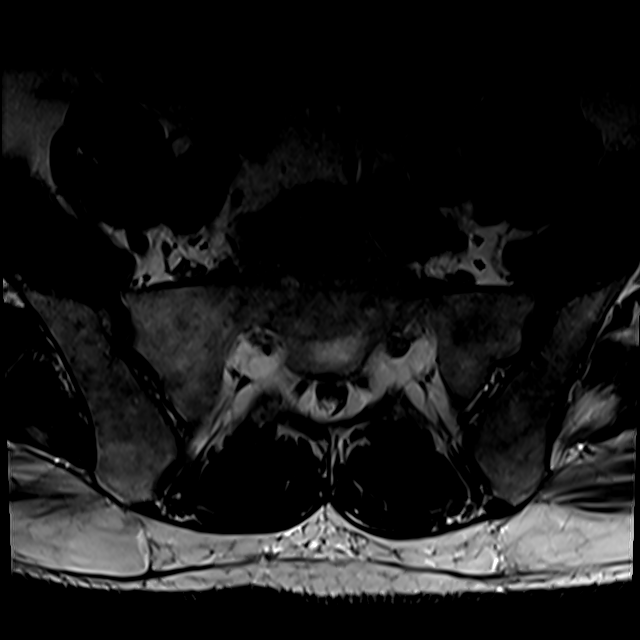
[im 6/43]
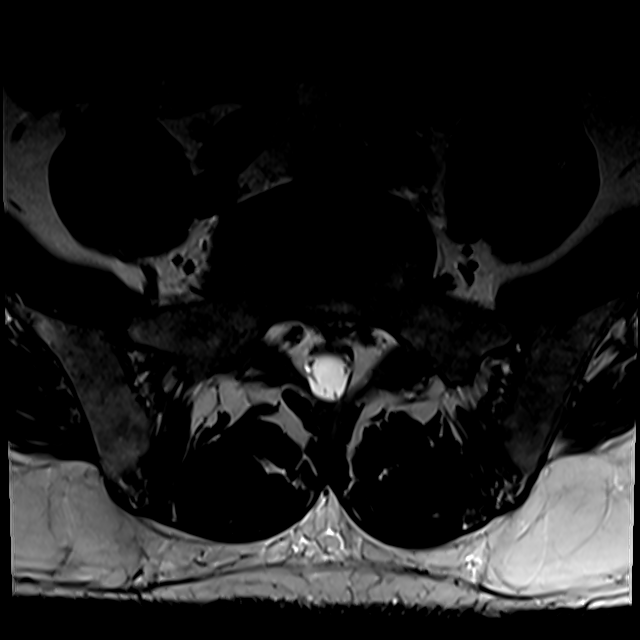
[im 9/43]
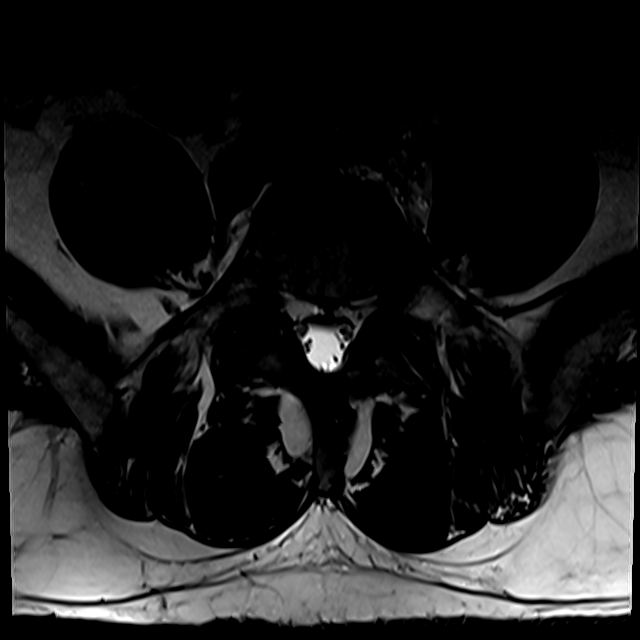
[im 15/43]
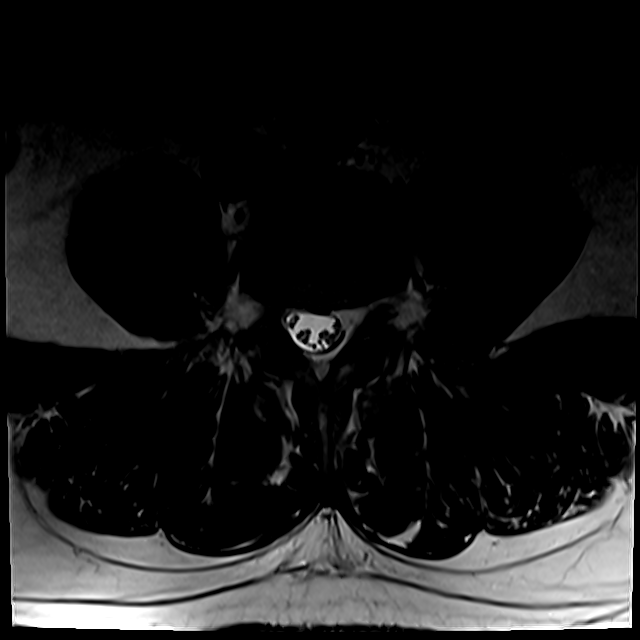
[im 20/43]
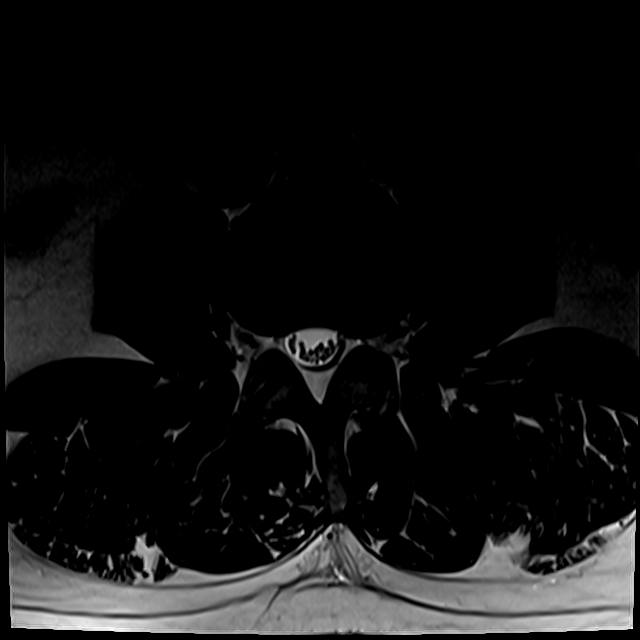
[im 23/43]
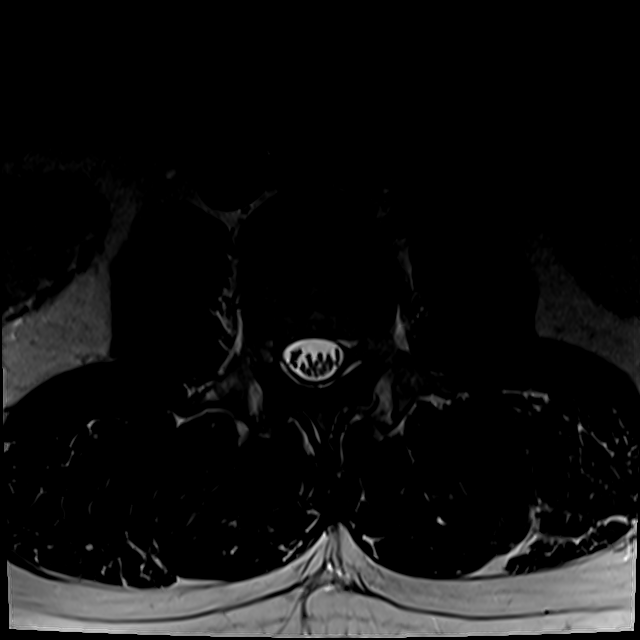
[im 37/43]
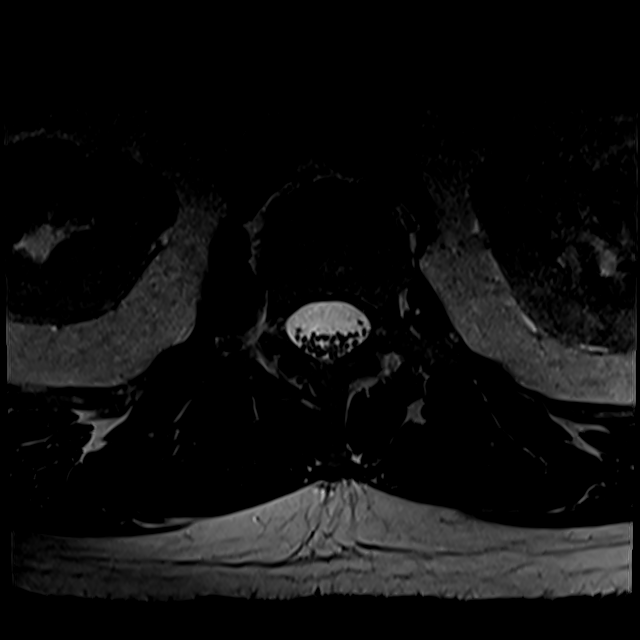

[Series 100: T1 · axial · 4.0mm · 0.28mm/px · z∈[-209,-32]mm · 3 of 43 slices shown (2 of 2)]
[im 6/43]
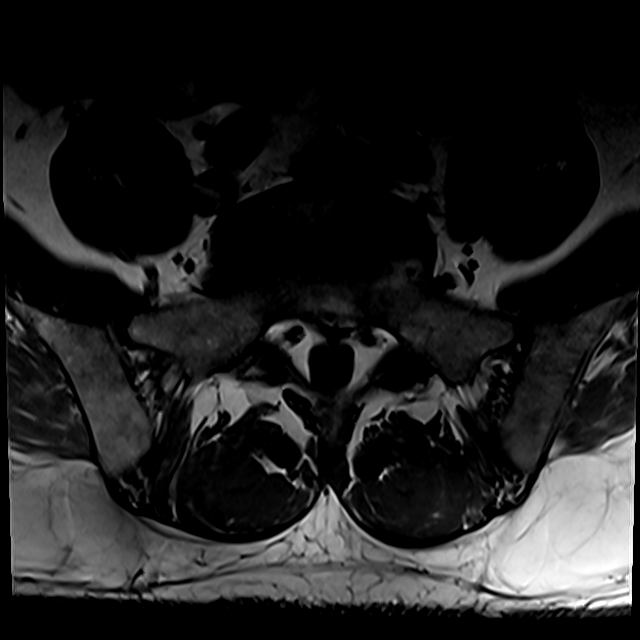
[im 23/43]
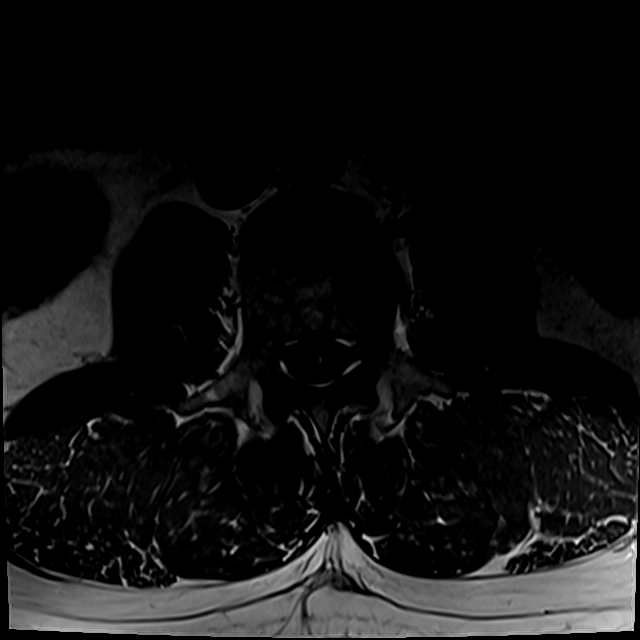
[im 37/43]
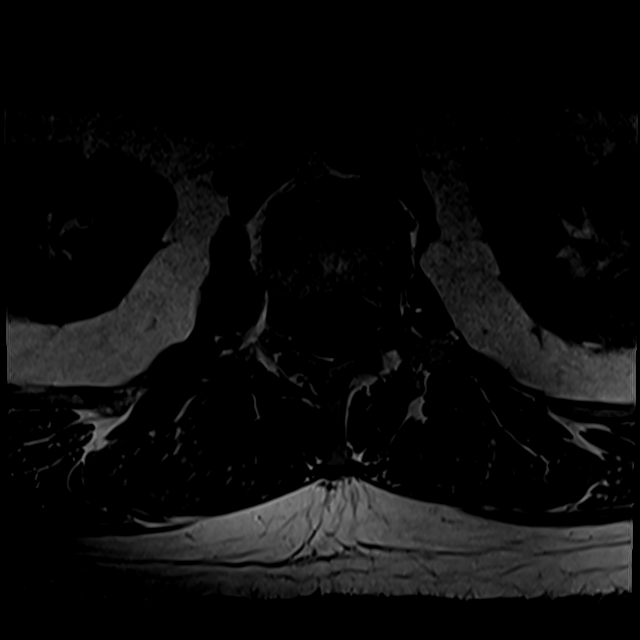

[19 of 48 positions shown; findings below may reference images not displayed]

FINDINGS: Segmentation:  Standard.

Alignment:  Physiologic.

Vertebrae:  No fracture, evidence of discitis, or bone lesion.

Conus medullaris and cauda equina: Conus extends to the L1 level.
Conus and cauda equina appear normal.

Paraspinal and other soft tissues: Negative.

Disc levels:

The intervertebral disc spaces from T11-12 to L3-L4 are normal.

L4-L5: Disc desiccation with mild diffuse bulge. No spinal canal or
neural foraminal stenosis.

L5-S1: Medium-size central disc protrusion that narrows the left
lateral recess and slightly displaces the descending left S1 nerve
root. Mild left neural foraminal stenosis.
IMPRESSION: 1. L5-S1 central disc protrusion that narrows the left lateral
recess and slightly displaces the descending left S1 nerve root.
This also causes mild narrowing of the left neural foramen.
2. No spinal canal stenosis or other neural impingement.

## 2020-03-13 ENCOUNTER — Ambulatory Visit: Payer: Medicare Other | Admitting: Cardiology

## 2020-03-13 NOTE — Progress Notes (Deleted)
Clinical Summary Mr. Arp is a 55 y.o.male seen today for a focused visit on history of chronic systolic HF   1. CAD/ICM/Chronic systolic HF - history of anterior STEMI 02/2008, received DES to LAD. LVEF at that time was 35% - 2012 myoview anterior scar with mild ischemia - 06/2011 echo LVEF 45-50% - 06/2011 GXT no ischemia - 07/2016 echo shows LVEF down to 30-35%, diffuse hypokinesis with akinesis of anerior and anteroseptal walls. Evidence of LV thrombus - with drop in LVEF and ongoing chest pain was referred for cath  08/2016 cath: see report below, received DES to D2 99%. Ok to stop ASA once coumadin is therapeutic per interventional cardiology, continue plavix.  11/2016 echo LVEF 35%, apical thrombus - 02/2017 echo LVEF 35-40%, restrictive, no clear thrombus  Jan 2020 nuclear stress: large anterior/anteroseptal scar, LVEF 22%.    - last visit we changed to entersto 24/26mg  bid. Tolerating well without side effects, not expensive through his insurance.  - no recent symptoms    2.History of priorLV thrombus - noted on11/2018 echo -repeat echo 02/2017 showed resolution of clot, coumadin was stopped.   3. HTN - did not start aldactone.    4. OSA - has prior diagnosis. Not wearing cpap due to discomfort. SIgnificant daytime somonlence. - pcp referred again for sleep study but could not afford.   SH: he ison disability    Past Medical History:  Diagnosis Date  . Anxiety   . Arthritis    in lower back  . Coronary artery disease    a. 02/2008: anterior STEMI s/p DES to prox LAD, b. Myoview 2012: scar in the anterior wall with mild ischemia and EF 33%, c. neg GXT 06/2011  d. 08/2016: cath with DES to diagonal   . Depression with anxiety   . Diabetes mellitus    TYPE II  . Fatty liver    H/O ELEVATED LIVER ENZYMES  . High cholesterol   . Hyperlipidemia   . Hypertension   . Ischemic cardiomyopathy    a. Previous EF 33-35% 2012, b. improved to  45-50% by echo 06/2011  . LV (left ventricular) mural thrombus without MI (HCC)   . Myocardial infarction (HCC)   . Noncompliance    Prior hx of med noncompliance  . OSA (obstructive sleep apnea)   . Sleep apnea      No Known Allergies   Current Outpatient Medications  Medication Sig Dispense Refill  . aspirin EC 81 MG tablet Take 81 mg by mouth daily.    . carvedilol (COREG) 12.5 MG tablet Take 1.5 tablets (18.75 mg total) by mouth 2 (two) times daily with a meal. 270 tablet 1  . escitalopram (LEXAPRO) 10 MG tablet Take 10 mg by mouth daily.    Marland Kitchen glipiZIDE (GLUCOTROL XL) 10 MG 24 hr tablet Take 10 mg by mouth daily.    Marland Kitchen JARDIANCE 25 MG TABS tablet Take 25 mg by mouth daily.    . nitroGLYCERIN (NITROSTAT) 0.4 MG SL tablet Place 0.4 mg under the tongue every 5 (five) minutes as needed for chest pain.    Marland Kitchen oxyCODONE-acetaminophen (PERCOCET) 5-325 MG tablet Take 1 tablet by mouth every 4 (four) hours as needed for severe pain. 15 tablet 0  . sacubitril-valsartan (ENTRESTO) 49-51 MG Take 1 tablet by mouth 2 (two) times daily. 60 tablet 3  . Semaglutide (RYBELSUS) 3 MG TABS Take 3 mg by mouth daily.    . sildenafil (REVATIO) 20 MG tablet Take 20  mg by mouth daily as needed (ED).     . simvastatin (ZOCOR) 40 MG tablet Take 1 tablet (40 mg total) by mouth at bedtime. 90 tablet 1  . spironolactone (ALDACTONE) 25 MG tablet Take 0.5 tablets (12.5 mg total) by mouth daily. 45 tablet 1  . tadalafil (CIALIS) 20 MG tablet Take 1 tablet (20 mg total) by mouth daily as needed for erectile dysfunction. 10 tablet 5   No current facility-administered medications for this visit.     Past Surgical History:  Procedure Laterality Date  . CARDIAC CATHETERIZATION    . CORONARY ANGIOPLASTY    . CORONARY STENT INTERVENTION N/A 08/21/2016   Procedure: CORONARY STENT INTERVENTION;  Surgeon: Kathleene Hazel, MD;  Location: [facility];  Service: Cardiovascular;  Laterality: N/A;  . CYSTOSCOPY  N/A 12/04/2019   Procedure: CYSTOSCOPY;  Surgeon: Malen Gauze, MD;  Location: [facility];  Service: Urology;  Laterality: N/A;  . INTRAVASCULAR PRESSURE WIRE/FFR STUDY N/A 08/21/2016   Procedure: INTRAVASCULAR PRESSURE WIRE/FFR STUDY;  Surgeon: Kathleene Hazel, MD;  Location: [facility];  Service: Cardiovascular;  Laterality: N/A;  . LIVER BIOPSY    . MI WITH STENTS    . NASAL SEPTOPLASTY W/ TURBINOPLASTY N/A 01/25/2019   Procedure: BILATERAL TURBINATE REDUCTION;  Surgeon: Newman Pies, MD;  Location: [facility];  Service: ENT;  Laterality: N/A;  . NASAL SINUS SURGERY    . RIGHT/LEFT HEART CATH AND CORONARY ANGIOGRAPHY N/A 08/21/2016   Procedure: Right/Left Heart Cath and Coronary Angiography;  Surgeon: Kathleene Hazel, MD;  Location[facility];  Service: Cardiovascular;  Laterality: N/A;  . TRANSURETHRAL RESECTION OF PROSTATE N/A 12/04/2019   Procedure: TRANSURETHRAL RESECTION OF THE PROSTATE (TURP);  Surgeon: Malen Gauze, MD;  Location: [facility];  Service: Urology;  Laterality: N/A;     No Known Allergies    Family History  Problem Relation Age of Onset  . Coronary artery disease Other        UNKNOWN  . Heart disease Father        CAD s/p CABG  . Diabetes Mellitus II Father   . Diabetes Mellitus II Mother   . Cancer Brother      Social History Mr. Byers reports that he has never smoked. He has never used smokeless tobacco. Mr. Scipio reports no history of alcohol use.   Review of Systems CONSTITUTIONAL: No weight loss, fever, chills, weakness or fatigue.  HEENT: Eyes: No visual loss, blurred vision, double vision or yellow sclerae.No hearing loss, sneezing, congestion, runny nose or sore throat.  SKIN: No rash or itching.  CARDIOVASCULAR:  RESPIRATORY: No shortness of breath, cough or sputum.  GASTROINTESTINAL: No anorexia, nausea, vomiting or diarrhea. No abdominal pain or blood.  GENITOURINARY: No burning on urination, no polyuria NEUROLOGICAL:  No headache, dizziness, syncope, paralysis, ataxia, numbness or tingling in the extremities. No change in bowel or bladder control.  MUSCULOSKELETAL: No muscle, back pain, joint pain or stiffness.  LYMPHATICS: No enlarged nodes. No history of splenectomy.  PSYCHIATRIC: No history of depression or anxiety.  ENDOCRINOLOGIC: No reports of sweating, cold or heat intolerance. No polyuria or polydipsia.  Marland Kitchen   Physical Examination There were no vitals filed for this visit. There were no vitals filed for this visit.  Gen: resting comfortably, no acute distress HEENT: no scleral icterus, pupils equal round and reactive, no palptable cervical adenopathy,  CV Resp: Clear to auscultation bilaterally GI: abdomen is soft, non-tender, non-distended, normal  bowel sounds, no hepatosplenomegaly MSK: extremities are warm, no edema.  Skin: warm, no rash Neuro:  no focal deficits Psych: appropriate affect   Diagnostic Studies  6./2013 echo Study Conclusions  - Left ventricle: Systolic function was mildly reduced. The estimated ejection fraction was in the range of 45% to 50%. - Left atrium: The atrium was mildly dilated.  08/07/16 echo Study Conclusions  - Left ventricle: LV systolic function is depressed at approximately 30 to 35% with severe hypokinesis/akinesis of the inferior, septal, distal lateral and apical walls. Cannot exclude mural thrombus at apex. Suggest limited echo with Definity to define The cavity size was normal. Wall thickness was normal. - Mitral valve: There was mild regurgitation. - Left atrium: The atrium was mildly dilated.  08/14/16 echo Study Conclusions  - Procedure narrative: Transthoracic echocardiography. Image quality was adequate. The study was technically difficult. Intravenous contrast (Definity) was administered. - Left ventricle: Systolic function was moderately to severely reduced. The estimated ejection fraction was in the range  of 30% to 35%. Diffuse hypokinesis. There is akinesis of the anteroseptal, anterior, and apical myocardium. - Right ventricle: Systolic function was normal.  Impressions:  - Limited study with Definity contrast. LVEF is in the range of 30-35%. There is a loosely organized LV apical thrombus noted. Diffuse hypokinesis overall with akinesis of the anterior, anteroseptal, and apical myocardium. RV contraction normal based on limited views.  08/2016 cath  Mid RCA-2 lesion, 30 %stenosed.  Mid RCA-1 lesion, 30 %stenosed.  Ost Ramus to Ramus lesion, 20 %stenosed.  Ost LAD to Prox LAD lesion, 10 %stenosed.  Prox LAD to Mid LAD lesion, 60 %stenosed.  A STENT SYNERGY DES 2.25X12 drug eluting stent was successfully placed.  2nd Diag lesion, 99 %stenosed.  Post intervention, there is a 0% residual stenosis.  1. Patent stent proximal LAD 2. Moderate stenosis mid LAD. FFR was 0.84 suggesting the stenosis was not flow limiting.  3. The Diagonal Serina Nichter is a moderate caliber vessel with a 99% stenosis.  4. Successful PTCA/DES x 1 Diagonal Nicolai Labonte 5. Mild disease RCA  Recommendations: Will continue ASA and Plavix for now. He will need to be started on coumadin tomorrow before discharge for his LV thrombus. He can stop ASA when his INR is therapeutic on coumadin since he was not an ACS. I would plan long term coumadin for LV thrombus and Plavix for at least one year given placement of DES. Continue statin and beta blocker.  02/2017 echo Study Conclusions  - Left ventricle: The cavity size was normal. Wall thickness was increased in a pattern of mild LVH. Systolic function was moderately reduced. The estimated ejection fraction was in the range of 35% to 40%. No definitive thrombus seen with contrast enhancement. Diffuse hypokinesis. Diastolic dysfunction with restrictive physiology. - Regional wall motion abnormality: Akinesis of the mid anterior and  basal-mid anteroseptal myocardium. - Right ventricle: Systolic function was reduced.   Jan 2020 nuclear stress  No diagnostic ST segment changes to indicate ischemia.  Large, dense, anterior and anteroseptal defect extending from apex to base consistent with infarct scar.  This is a high risk study.  Nuclear stress EF: 22%. Consistent with ischemic cardiomyopathy and large anterior/anteroseptal infarct scar, no significant ischemia.   Assessment and Plan    1. CAD/ICM/chronic systolic HF - Fatigue on higher coreg dosing.Prior issues with orthostatic symptoms had limited medical therapy but these have resolved - tolerating recent change to entresto, we will increase dose to 49/51mg  bid to see if tolerated.  -  check BMET 2 weeks    F/u 1 month, if tolerating current entresto dose titrate further.      Antoine Poche, M.D., F.A.C.C.

## 2020-03-15 ENCOUNTER — Ambulatory Visit (INDEPENDENT_AMBULATORY_CARE_PROVIDER_SITE_OTHER): Payer: Medicare Other | Admitting: Urology

## 2020-03-15 ENCOUNTER — Encounter: Payer: Self-pay | Admitting: Urology

## 2020-03-15 ENCOUNTER — Other Ambulatory Visit: Payer: Self-pay

## 2020-03-15 VITALS — BP 151/99 | HR 98 | Temp 97.7°F | Ht 70.0 in | Wt 230.0 lb

## 2020-03-15 DIAGNOSIS — N138 Other obstructive and reflux uropathy: Secondary | ICD-10-CM

## 2020-03-15 DIAGNOSIS — N5201 Erectile dysfunction due to arterial insufficiency: Secondary | ICD-10-CM | POA: Diagnosis not present

## 2020-03-15 DIAGNOSIS — R339 Retention of urine, unspecified: Secondary | ICD-10-CM | POA: Diagnosis not present

## 2020-03-15 DIAGNOSIS — N401 Enlarged prostate with lower urinary tract symptoms: Secondary | ICD-10-CM | POA: Diagnosis not present

## 2020-03-15 DIAGNOSIS — N3281 Overactive bladder: Secondary | ICD-10-CM | POA: Insufficient documentation

## 2020-03-15 LAB — URINALYSIS, ROUTINE W REFLEX MICROSCOPIC
Bilirubin, UA: NEGATIVE
Leukocytes,UA: NEGATIVE
Nitrite, UA: NEGATIVE
Protein,UA: NEGATIVE
Specific Gravity, UA: 1.01 (ref 1.005–1.030)
Urobilinogen, Ur: 1 mg/dL (ref 0.2–1.0)
pH, UA: 6 (ref 5.0–7.5)

## 2020-03-15 LAB — MICROSCOPIC EXAMINATION
Bacteria, UA: NONE SEEN
Renal Epithel, UA: NONE SEEN /hpf

## 2020-03-15 LAB — BLADDER SCAN AMB NON-IMAGING: Scan Result: 218

## 2020-03-15 MED ORDER — SILDENAFIL CITRATE 100 MG PO TABS: 100.0000 mg | ORAL_TABLET | Freq: Every day | ORAL | 0 refills | Status: DC | PRN

## 2020-03-15 NOTE — Progress Notes (Signed)
Urological Symptom Review  Patient is experiencing the following symptoms: Frequent urination Get up at night to urinate Leakage of urine Erection problems (male only)   Review of Systems  Gastrointestinal (upper)  : Negative for upper GI symptoms  Gastrointestinal (lower) : Constipation  Constitutional : Fatigue  Skin: Negative for skin symptoms  Eyes: Blurred vision  Ear/Nose/Throat : Sinus problems  Hematologic/Lymphatic: Negative for Hematologic/Lymphatic symptoms  Cardiovascular : Negative for cardiovascular symptoms  Respiratory : Shortness of breath  Endocrine: Excessive thirst  Musculoskeletal: Back pain Joint pain  Neurological: Headaches Dizziness  Psychologic: Depression Anxiety

## 2020-03-15 NOTE — Patient Instructions (Signed)
Overactive Bladder, Adult  Overactive bladder is a condition in which a person has a sudden and frequent need to urinate. A person might also leak urine if he or she cannot get to the bathroom fast enough (urinary incontinence). Sometimes, symptoms can interfere with work or social activities. What are the causes? Overactive bladder is associated with poor nerve signals between your bladder and your brain. Your bladder may get the signal to empty before it is full. You may also have very sensitive muscles that make your bladder squeeze too soon. This condition may also be caused by other factors, such as:  Medical conditions: ? Urinary tract infection. ? Infection of nearby tissues. ? Prostate enlargement. ? Bladder stones, inflammation, or tumors. ? Diabetes. ? Muscle or nerve weakness, especially from these conditions:  A spinal cord injury.  Stroke.  Multiple sclerosis.  Parkinson's disease.  Other causes: ? Surgery on the uterus or urethra. ? Drinking too much caffeine or alcohol. ? Certain medicines, especially those that eliminate extra fluid in the body (diuretics). ? Constipation. What increases the risk? You may be at greater risk for overactive bladder if you:  Are an older adult.  Smoke.  Are going through menopause.  Have prostate problems.  Have a neurological disease, such as stroke, dementia, Parkinson's disease, or multiple sclerosis (MS).  Eat or drink alcohol, spicy food, caffeine, and other things that irritate the bladder.  Are overweight or obese. What are the signs or symptoms? Symptoms of this condition include a sudden, strong urge to urinate. Other symptoms include:  Leaking urine.  Urinating 8 or more times a day.  Waking up to urinate 2 or more times overnight. How is this diagnosed? This condition may be diagnosed based on:  Your symptoms and medical history.  A physical exam.  Blood or urine tests to check for possible causes,  such as infection. You may also need to see a health care provider who specializes in urinary tract problems. This is called a urologist. How is this treated? Treatment for overactive bladder depends on the cause of your condition and whether it is mild or severe. Treatment may include:  Bladder training, such as: ? Learning to control the urge to urinate by following a schedule to urinate at regular intervals. ? Doing Kegel exercises to strengthen the pelvic floor muscles that support your bladder.  Special devices, such as: ? Biofeedback. This uses sensors to help you become aware of your body's signals. ? Electrical stimulation. This uses electrodes placed inside the body (implanted) or outside the body. These electrodes send gentle pulses of electricity to strengthen the nerves or muscles that control the bladder. ? Women may use a plastic device, called a pessary, that fits into the vagina and supports the bladder.  Medicines, such as: ? Antibiotics to treat bladder infection. ? Antispasmodics to stop the bladder from releasing urine at the wrong time. ? Tricyclic antidepressants to relax bladder muscles. ? Injections of botulinum toxin type A directly into the bladder tissue to relax bladder muscles.  Surgery, such as: ? A device may be implanted to help manage the nerve signals that control urination. ? An electrode may be implanted to stimulate electrical signals in the bladder. ? A procedure may be done to change the shape of the bladder. This is done only in very severe cases. Follow these instructions at home: Eating and drinking  Make diet or lifestyle changes recommended by your health care provider. These may include: ? Drinking fluids   throughout the day and not only with meals. ? Cutting down on caffeine or alcohol. ? Eating a healthy and balanced diet to prevent constipation. This may include:  Choosing foods that are high in fiber, such as beans, whole grains, and  fresh fruits and vegetables.  Limiting foods that are high in fat and processed sugars, such as fried and sweet foods.   Lifestyle  Lose weight if needed.  Do not use any products that contain nicotine or tobacco. These include cigarettes, chewing tobacco, and vaping devices, such as e-cigarettes. If you need help quitting, ask your health care provider.   General instructions  Take over-the-counter and prescription medicines only as told by your health care provider.  If you were prescribed an antibiotic medicine, take it as told by your health care provider. Do not stop taking the antibiotic even if you start to feel better.  Use any implants or pessary as told by your health care provider.  If needed, wear pads to absorb urine leakage.  Keep a log to track how much and when you drink, and when you need to urinate. This will help your health care provider monitor your condition.  Keep all follow-up visits. This is important. Contact a health care provider if:  You have a fever or chills.  Your symptoms do not get better with treatment.  Your pain and discomfort get worse.  You have more frequent urges to urinate. Get help right away if:  You are not able to control your bladder. Summary  Overactive bladder refers to a condition in which a person has a sudden and frequent need to urinate.  Several conditions may lead to an overactive bladder.  Treatment for overactive bladder depends on the cause and severity of your condition.  Making lifestyle changes, doing Kegel exercises, keeping a log, and taking medicines can help with this condition. This information is not intended to replace advice given to you by your health care provider. Make sure you discuss any questions you have with your health care provider. Document Revised: 09/25/2019 Document Reviewed: 09/25/2019 [employer] Patient Education  2021 [employer]  

## 2020-03-15 NOTE — Progress Notes (Signed)
03/15/2020 10:08 AM   [name] [name]. 1965/10/14 [id number]  Referring provider: Rebekah Chesterfield, NP [address] [place],  [place] [address]  followup BPH  HPI: [name] is a 55yo here for followup for BPH with incomplete emptying. PVR 218. Stream is good. Nocturia 3-4x. He has urinary urgency and daily urge incontinence, urinary frequency every hour. He is on jardiance for DMII. He is happy with the stream but the incontinence is bothersome. NO hematuria or dysuria.    PMH: Past Medical History:  Diagnosis Date  . Anxiety   . Arthritis    in lower back  . Coronary artery disease    a. 02/2008: anterior STEMI s/p DES to prox LAD, b. Myoview 2012: scar in the anterior wall with mild ischemia and EF 33%, c. neg GXT 06/2011  d. 08/2016: cath with DES to diagonal   . Depression with anxiety   . Diabetes mellitus    TYPE II  . Fatty liver    H/O ELEVATED LIVER ENZYMES  . High cholesterol   . Hyperlipidemia   . Hypertension   . Ischemic cardiomyopathy    a. Previous EF 33-35% 2012, b. improved to 45-50% by echo 06/2011  . LV (left ventricular) mural thrombus without MI (HCC)   . Myocardial infarction (HCC)   . Noncompliance    Prior hx of med noncompliance  . OSA (obstructive sleep apnea)   . Sleep apnea     Surgical History: Past Surgical History:  Procedure Laterality Date  . CARDIAC CATHETERIZATION    . CORONARY ANGIOPLASTY    . CORONARY STENT INTERVENTION N/A 08/21/2016   Procedure: CORONARY STENT INTERVENTION;  Surgeon: Kathleene Hazel, MD;  [facility];  Service: Cardiovascular;  Laterality: N/A;  . CYSTOSCOPY N/A 12/04/2019   Procedure: CYSTOSCOPY;  Surgeon: Malen Gauze, MD;  Location: [facility];  Service: Urology;  Laterality: N/A;  . INTRAVASCULAR PRESSURE WIRE/FFR STUDY N/A 08/21/2016   Procedure: INTRAVASCULAR PRESSURE WIRE/FFR STUDY;  Surgeon: Kathleene Hazel, MD;  [facility];  Service: Cardiovascular;   Laterality: N/A;  . LIVER BIOPSY    . MI WITH STENTS    . NASAL SEPTOPLASTY W/ TURBINOPLASTY N/A 01/25/2019   Procedure: BILATERAL TURBINATE REDUCTION;  Surgeon: Newman Pies, MD;  Location: [facility];  Service: ENT;  Laterality: N/A;  . NASAL SINUS SURGERY    . RIGHT/LEFT HEART CATH AND CORONARY ANGIOGRAPHY N/A 08/21/2016   Procedure: Right/Left Heart Cath and Coronary Angiography;  Surgeon: Kathleene Hazel, MD;  [facility];  Service: Cardiovascular;  Laterality: N/A;  . TRANSURETHRAL RESECTION OF PROSTATE N/A 12/04/2019   Procedure: TRANSURETHRAL RESECTION OF THE PROSTATE (TURP);  Surgeon: Malen Gauze, MD;  Location: [facility];  Service: Urology;  Laterality: N/A;    Home Medications:  Allergies as of 03/15/2020   No Known Allergies     Medication List       Accurate as of March 15, 2020 10:08 AM. If you have any questions, ask your nurse or doctor.        aspirin EC 81 MG tablet Take 81 mg by mouth daily.   carvedilol 12.5 MG tablet Commonly known as: COREG Take 1.5 tablets (18.75 mg total) by mouth 2 (two) times daily with a meal.   Entresto 49-51 MG Generic drug: sacubitril-valsartan Take 1 tablet by mouth 2 (two) times daily.   escitalopram 10 MG tablet Commonly known as: LEXAPRO  Take 10 mg by mouth daily.   glipiZIDE 10 MG 24 hr tablet Commonly known as: GLUCOTROL XL Take 10 mg by mouth daily.   Jardiance 25 MG Tabs tablet Generic drug: empagliflozin Take 25 mg by mouth daily.   nitroGLYCERIN 0.4 MG SL tablet Commonly known as: NITROSTAT Place 0.4 mg under the tongue every 5 (five) minutes as needed for chest pain.   oxyCODONE-acetaminophen 5-325 MG tablet Commonly known as: Percocet Take 1 tablet by mouth every 4 (four) hours as needed for severe pain.   Rybelsus 3 MG Tabs Generic drug: Semaglutide Take 3 mg by mouth daily.   sildenafil 20 MG tablet Commonly known as: REVATIO Take 20 mg by mouth daily as needed (ED).    simvastatin 40 MG tablet Commonly known as: ZOCOR Take 1 tablet (40 mg total) by mouth at bedtime.   spironolactone 25 MG tablet Commonly known as: ALDACTONE Take 0.5 tablets (12.5 mg total) by mouth daily.   tadalafil 20 MG tablet Commonly known as: CIALIS Take 1 tablet (20 mg total) by mouth daily as needed for erectile dysfunction.       Allergies: No Known Allergies  Family History: Family History  Problem Relation Age of Onset  . Coronary artery disease Other        UNKNOWN  . Heart disease Father        CAD s/p CABG  . Diabetes Mellitus II Father   . Diabetes Mellitus II Mother   . Cancer Brother     Social History:  reports that he has never smoked. He has never used smokeless tobacco. He reports that he does not drink alcohol and does not use drugs.  ROS: All other review of systems were reviewed and are negative except what is noted above in HPI  Physical Exam: BP (!) 151/99   Pulse 98   Temp 97.7 F (36.5 C)   Ht 5\' 10"  (1.778 m)   Wt 230 lb (104.3 kg)   BMI 33.00 kg/m   Constitutional:  Alert and oriented, No acute distress. HEENT: [place] AT, moist mucus membranes.  Trachea midline, no masses. Cardiovascular: No clubbing, cyanosis, or edema. Respiratory: Normal respiratory effort, no increased work of breathing. GI: Abdomen is soft, nontender, nondistended, no abdominal masses GU: No CVA tenderness.  Lymph: No cervical or inguinal lymphadenopathy. Skin: No rashes, bruises or suspicious lesions. Neurologic: Grossly intact, no focal deficits, moving all 4 extremities. Psychiatric: Normal mood and affect.  Laboratory Data: Lab Results  Component Value Date   WBC 10.2 12/05/2019   HGB 14.2 12/05/2019   HCT 44.4 12/05/2019   MCV 91.5 12/05/2019   PLT 247 12/05/2019    Lab Results  Component Value Date   CREATININE 0.72 12/05/2019    Lab Results  Component Value Date   PSA 0.5 07/12/2019    No results found for: TESTOSTERONE  Lab Results   Component Value Date   HGBA1C 7.4 (H) 12/04/2019    Urinalysis    Component Value Date/Time   COLORURINE AMBER (A) 05/19/2019 1600   APPEARANCEUR Clear 03/15/2020 0927   LABSPEC 1.031 (H) 05/19/2019 1600   PHURINE 6.0 05/19/2019 1600   GLUCOSEU 3+ (A) 03/15/2020 0927   HGBUR LARGE (A) 05/19/2019 1600   BILIRUBINUR Negative 03/15/2020 0927   KETONESUR 20 (A) 05/19/2019 1600   PROTEINUR Negative 03/15/2020 0927   PROTEINUR >=300 (A) 05/19/2019 1600   UROBILINOGEN 0.2 09/29/2019 0910   NITRITE Negative 03/15/2020 0927   NITRITE NEGATIVE 05/19/2019 1600  LEUKOCYTESUR Negative 03/15/2020 0927   LEUKOCYTESUR NEGATIVE 05/19/2019 1600    Lab Results  Component Value Date   LABMICR See below: 03/15/2020   WBCUA 0-5 03/15/2020   LABEPIT 0-10 03/15/2020   BACTERIA None seen 03/15/2020    Pertinent Imaging:  No results found for this or any previous visit.  No results found for this or any previous visit.  No results found for this or any previous visit.  No results found for this or any previous visit.  No results found for this or any previous visit.  No results found for this or any previous visit.  No results found for this or any previous visit.  No results found for this or any previous visit.   Assessment & Plan:    1. Benign prostatic hyperplasia with urinary obstruction -stream improved after TURP - Urinalysis, Routine w reflex microscopic - Bladder Scan (Post Void Residual) in office  2. Incomplete bladder emptying -Improved after TURP  3. Urge urinary incontinence -I discussed the various causes of incontinence and I counseled the patient to discuss changing his DMII meds, specifically jardiance, as this is exacerbating his LUTS.    No follow-ups on file.  Wilkie Aye, MD  [facility] [place]

## 2020-03-18 NOTE — Progress Notes (Signed)
Cardiology Office Note  Date: 03/19/2020   ID: [name] [name]., DOB 1965-02-23, MRN [id number]  PCP:  Rebekah Chesterfield, NP  Cardiologist:  [name], MD Electrophysiologist:  None   Chief Complaint: Chronic systolic heart failure  History of Present Illness: [name] [name] a 55 y.o. male with a history of CAD, ICM, chronic systolic heart failure.  Last encounter with Dr. Wyline Mood 10/31/2019.  Patient had previously seen Dr. Wyline Mood and started on Entresto 24/26 mg p.o. twice daily.  He was tolerating well without side effects.  Having no recent symptoms.  Plan was to increase Entresto dosing to 49/51 mg p.o. twice daily and check basic metabolic panel in 2 weeks.  He was to follow-up in 1 month and if tolerating current Entresto dose would titrate further.  [name] here for 1 month follow-up.  States she [name] tolerating the [place] well.  He had lab work to be done after increasing dose at last visit.  He states he apparently forgot to have the lab work drawn.  However tomorrow [name] having preop lab work for upcoming TURP surgery for Dr. Ronne Binning.  He denies any anginal or exertional symptoms, orthostatic symptoms, DOE, SOB, lower extremity edema.  No palpitations or arrhythmias, CVA or TIA-like symptoms, PND, orthopnea.  [name] here today for 56-month follow-up.  [name] continuing to have shortness of breath with activity.  States he has gained some weight but he has been stressed eating at home.  States he has been having some increased stress at home.  Otherwise he denies any anginal or exertional symptoms, orthostatic symptoms, palpitations or arrhythmias, CVA or TIA-like symptoms, PND, orthopnea, bleeding, DVT or PE-like symptoms, or lower extremity edema.  [name] taking all his medications as directed.  [name] status post recent TURP with Dr. Ronne Binning.  Still having some problems with some urinary incontinence.  Past Medical History:  Diagnosis Date  . Anxiety   .  Arthritis    in lower back  . Coronary artery disease    a. 02/2008: anterior STEMI s/p DES to prox LAD, b. Myoview 2012: scar in the anterior wall with mild ischemia and EF 33%, c. neg GXT 06/2011  d. 08/2016: cath with DES to diagonal   . Depression with anxiety   . Diabetes mellitus    TYPE II  . Fatty liver    H/O ELEVATED LIVER ENZYMES  . High cholesterol   . Hyperlipidemia   . Hypertension   . Ischemic cardiomyopathy    a. Previous EF 33-35% 2012, b. improved to 45-50% by echo 06/2011  . LV (left ventricular) mural thrombus without MI (HCC)   . Myocardial infarction (HCC)   . Noncompliance    Prior hx of med noncompliance  . OSA (obstructive sleep apnea)   . Sleep apnea     Past Surgical History:  Procedure Laterality Date  . CARDIAC CATHETERIZATION    . CORONARY ANGIOPLASTY    . CORONARY STENT INTERVENTION N/A 08/21/2016   Procedure: CORONARY STENT INTERVENTION;  Surgeon: Kathleene Hazel, MD;  [facility];  Service: Cardiovascular;  Laterality: N/A;  . CYSTOSCOPY N/A 12/04/2019   Procedure: CYSTOSCOPY;  Surgeon: Malen Gauze, MD;  Location: [facility];  Service: Urology;  Laterality: N/A;  . INTRAVASCULAR PRESSURE WIRE/FFR STUDY N/A 08/21/2016   Procedure: INTRAVASCULAR PRESSURE WIRE/FFR STUDY;  Surgeon: Kathleene Hazel, MD;  [facility];  Service: Cardiovascular;  Laterality: N/A;  . LIVER BIOPSY    . MI WITH STENTS    . NASAL SEPTOPLASTY W/ TURBINOPLASTY N/A 01/25/2019   Procedure: BILATERAL TURBINATE REDUCTION;  Surgeon: Newman Pies, MD;  Location: [facility];  Service: ENT;  Laterality: N/A;  . NASAL SINUS SURGERY    . RIGHT/LEFT HEART CATH AND CORONARY ANGIOGRAPHY N/A 08/21/2016   Procedure: Right/Left Heart Cath and Coronary Angiography;  Surgeon: Kathleene Hazel, MD;  Location: [facility] INVASIVE CV LAB;  Service: Cardiovascular;  Laterality: N/A;  . TRANSURETHRAL RESECTION OF PROSTATE N/A 12/04/2019   Procedure: TRANSURETHRAL  RESECTION OF THE PROSTATE (TURP);  Surgeon: Malen Gauze, MD;  Location: [facility];  Service: Urology;  Laterality: N/A;    Current Outpatient Medications  Medication Sig Dispense Refill  . aspirin EC 81 MG tablet Take 81 mg by mouth daily.    . carvedilol (COREG) 12.5 MG tablet Take 1.5 tablets (18.75 mg total) by mouth 2 (two) times daily with a meal. 270 tablet 1  . escitalopram (LEXAPRO) 10 MG tablet Take 10 mg by mouth daily.    Marland Kitchen glipiZIDE (GLUCOTROL XL) 10 MG 24 hr tablet Take 10 mg by mouth daily.    Marland Kitchen JARDIANCE 25 MG TABS tablet Take 25 mg by mouth daily.    . nitroGLYCERIN (NITROSTAT) 0.4 MG SL tablet Place 0.4 mg under the tongue every 5 (five) minutes as needed for chest pain.    Marland Kitchen oxyCODONE-acetaminophen (PERCOCET) 5-325 MG tablet Take 1 tablet by mouth every 4 (four) hours as needed for severe pain. 15 tablet 0  . sacubitril-valsartan (ENTRESTO) 49-51 MG Take 1 tablet by mouth 2 (two) times daily. 60 tablet 3  . Semaglutide (RYBELSUS) 3 MG TABS Take 3 mg by mouth daily.    . sildenafil (VIAGRA) 100 MG tablet Take 1 tablet (100 mg total) by mouth daily as needed for erectile dysfunction. 6 tablet 0  . simvastatin (ZOCOR) 40 MG tablet Take 1 tablet (40 mg total) by mouth at bedtime. 90 tablet 1  . spironolactone (ALDACTONE) 25 MG tablet Take 0.5 tablets (12.5 mg total) by mouth daily. 45 tablet 1   No current facility-administered medications for this visit.   Allergies:  Patient has no known allergies.   Social History: The patient  reports that he has never smoked. He has never used smokeless tobacco. He reports that he does not drink alcohol and does not use drugs.   Family History: The patient's family history includes Cancer in his brother; Coronary artery disease in an other family member; Diabetes Mellitus II in his father and mother; Heart disease in his father.   ROS:  Please see the history of present illness. Otherwise, complete review of systems [name] positive for  none.  All other systems are reviewed and negative.   Physical Exam: VS:  BP 138/88   Pulse 84   Ht 5\' 10"  (1.778 m)   Wt 240 lb 9.6 oz (109.1 kg)   SpO2 96%   BMI 34.52 kg/m , BMI Body mass index [name] 34.52 kg/m.  Wt Readings from Last 3 Encounters:  03/19/20 240 lb 9.6 oz (109.1 kg)  03/15/20 230 lb (104.3 kg)  12/11/19 230 lb (104.3 kg)    General: Patient appears comfortable at rest. Neck: Supple, no elevated JVP or carotid bruits, no thyromegaly. Lungs: Clear to auscultation, nonlabored breathing at rest. Cardiac: Regular rate and rhythm, no S3 or significant systolic murmur, no pericardial rub. Extremities: No pitting edema, distal pulses 2+. Skin:  Warm and dry. Musculoskeletal: No kyphosis. Neuropsychiatric: Alert and oriented x3, affect grossly appropriate.  ECG: EKG today shows normal sinus rhythm rate of 78, septal infarct age undetermined.  Recent Labwork: 12/05/2019: BUN 17; Creatinine, Ser 0.72; Hemoglobin 14.2; Platelets 247; Potassium 3.8; Sodium 133     Component Value Date/Time   CHOL 216 (H) 08/07/2016 1240   TRIG 118 08/07/2016 1240   HDL 34 (L) 08/07/2016 1240   CHOLHDL 6.4 08/07/2016 1240   VLDL 24 08/07/2016 1240   LDLCALC 158 (H) 08/07/2016 1240    Other Studies Reviewed Today:  Echocardiogram 06/22/2019 1. Left ventricular ejection fraction, by estimation, [name] 35%. The left ventricle has moderately decreased function. The left ventricle demonstrates regional wall motion abnormalities (see scoring diagram/findings for description). The left ventricular internal cavity size was mildly dilated. Left ventricular diastolic parameters are consistent with Grade I diastolic dysfunction (impaired relaxation). Elevated left atrial pressure. 2. Right ventricular systolic function [name] normal. The right ventricular size [name] normal. There [name] normal pulmonary artery systolic pressure. 3. The mitral valve [name] normal in structure. Trivial mitral valve regurgitation.  No evidence of mitral stenosis. 4. The aortic valve [name] tricuspid. Aortic valve regurgitation [name] not visualized. No aortic stenosis [name] present. 5. The inferior vena cava [name] normal in size with greater than 50% respiratory variability, suggesting right atrial pressure of 3 mmHg. Comparison(s): Echocardiogram done 03/03/17 showed an EF of 35-40%.   6./2013 echo Study Conclusions - Left ventricle: Systolic function was mildly reduced. The estimated ejection fraction was in the range of 45% to 50%. - Left atrium: The atrium was mildly dilated.  08/07/16 echo Study Conclusions - Left ventricle: LV systolic function [name] depressed at approximately 30 to 35% with severe hypokinesis/akinesis of the inferior, septal, distal lateral and apical walls. Cannot exclude mural thrombus at apex. Suggest limited echo with Definity to define The cavity size was normal. Wall thickness was normal. - Mitral valve: There was mild regurgitation. - Left atrium: The atrium was mildly dilated.  08/14/16 echo Study Conclusions  - Procedure narrative: Transthoracic echocardiography. Image quality was adequate. The study was technically difficult. Intravenous contrast (Definity) was administered. - Left ventricle: Systolic function was moderately to severely reduced. The estimated ejection fraction was in the range of 30% to 35%. Diffuse hypokinesis. There [name] akinesis of the anteroseptal, anterior, and apical myocardium. - Right ventricle: Systolic function was normal.  Impressions:  - Limited study with Definity contrast. LVEF [name] in the range of 30-35%. There [name] a loosely organized LV apical thrombus noted. Diffuse hypokinesis overall with akinesis of the anterior, anteroseptal, and apical myocardium. RV contraction normal based on limited views.  08/2016 cath  Mid RCA-2 lesion, 30 %stenosed.  Mid RCA-1 lesion, 30 %stenosed.  Ost Ramus to Ramus lesion, 20  %stenosed.  Ost LAD to Prox LAD lesion, 10 %stenosed.  Prox LAD to Mid LAD lesion, 60 %stenosed.  A STENT SYNERGY DES 2.25X12 drug eluting stent was successfully placed.  2nd Diag lesion, 99 %stenosed.  Post intervention, there [name] a 0% residual stenosis.  1. Patent stent proximal LAD 2. Moderate stenosis mid LAD. FFR was 0.84 suggesting the stenosis was not flow limiting.  3. The Diagonal branch [name] a moderate caliber vessel with a 99% stenosis.  4. Successful PTCA/DES x 1 Diagonal branch 5. Mild disease RCA  Recommendations: Will continue ASA and Plavix for now. He will need to be started on coumadin tomorrow before discharge for his LV thrombus. He can stop ASA when his  INR [name] therapeutic on coumadin since he was not an ACS. I would plan long term coumadin for LV thrombus and Plavix for at least one year given placement of DES. Continue statin and beta blocker.  02/2017 echo Study Conclusions  - Left ventricle: The cavity size was normal. Wall thickness was increased in a pattern of mild LVH. Systolic function was moderately reduced. The estimated ejection fraction was in the range of 35% to 40%. No definitive thrombus seen with contrast enhancement. Diffuse hypokinesis. Diastolic dysfunction with restrictive physiology. - Regional wall motion abnormality: Akinesis of the mid anterior and basal-mid anteroseptal myocardium. - Right ventricle: Systolic function was reduced.   Jan 2020 nuclear stress  No diagnostic ST segment changes to indicate ischemia.  Large, dense, anterior and anteroseptal defect extending from apex to base consistent with infarct scar.  This [name] a high risk study.  Nuclear stress EF: 22%. Consistent with ischemic cardiomyopathy and large anterior/anteroseptal infarct scar, no significant ischemia.  Assessment and Plan:   1. CAD in native artery No progressive anginal.  No nitroglycerin use.  Continue aspirin 81 mg daily,  sublingual nitroglycerin 0.4 mg as needed.  2. Cardiomyopathy, ischemic Recent echocardiogram June 2021: EF 35%, Positive for RWMA's, trivial MR.  Continue carvedilol 18.75 mg p.o. twice daily.  Increase Entresto to 97/103 mg p.o. twice daily.     3. Chronic systolic heart failure (HCC) Recent echo June 2021 EF 35%, continues with some shortness of breath with and without exertion at times.  States he has a fair amount of stress in his life at the moment.  Continue carvedilol 18.75 mg p.o. twice daily.  Continue spironolactone 12.5 mg p.o. twice daily.  Increase Entresto to 97/103 p.o. twice daily.  In 2 weeks get a follow-up basic metabolic panel and magnesium.  Prior to 64-month follow-up get an echocardiogram to assess for improved LV function.  4. Essential hypertension, benign BP reasonably well controlled with blood pressure 138/88.  Continue carvedilol 18.75 mg p.o. twice daily.  Carvedilol 18.75 mg p.o. twice daily.  Spironolactone 12.5 mg p.o. twice daily.  We are increasing Entresto to 97/103.  Hopefully this will have some influence on blood pressure.   Medication Adjustments/Labs and Tests Ordered: Current medicines are reviewed at length with the patient today.  Concerns regarding medicines are outlined above.   Disposition: Follow-up with Dr. Wyline Mood or APP 3 months.  Signed, Rennis Harding, NP 03/19/2020 9:00 AM    [facility] at [facility] [address] [place], [place], [place] [address] Phone: [phone]; Fax: [phone]

## 2020-03-19 ENCOUNTER — Encounter: Payer: Self-pay | Admitting: Family Medicine

## 2020-03-19 ENCOUNTER — Ambulatory Visit (INDEPENDENT_AMBULATORY_CARE_PROVIDER_SITE_OTHER): Payer: Medicare Other | Admitting: Family Medicine

## 2020-03-19 VITALS — BP 138/88 | HR 84 | Ht 70.0 in | Wt 240.6 lb

## 2020-03-19 DIAGNOSIS — I5022 Chronic systolic (congestive) heart failure: Secondary | ICD-10-CM | POA: Diagnosis not present

## 2020-03-19 DIAGNOSIS — I2510 Atherosclerotic heart disease of native coronary artery without angina pectoris: Secondary | ICD-10-CM

## 2020-03-19 DIAGNOSIS — I10 Essential (primary) hypertension: Secondary | ICD-10-CM | POA: Diagnosis not present

## 2020-03-19 DIAGNOSIS — I255 Ischemic cardiomyopathy: Secondary | ICD-10-CM | POA: Diagnosis not present

## 2020-03-19 MED ORDER — SACUBITRIL-VALSARTAN 97-103 MG PO TABS: 1.0000 | ORAL_TABLET | Freq: Two times a day (BID) | ORAL | 6 refills | Status: DC

## 2020-03-19 NOTE — Patient Instructions (Addendum)
Medication Instructions:   Increase Entresto to 97/103mg  twice a day.  Continue all other medications.    Labwork:  BMET, Mg - orders given today.   Please do in 2 weeks, around 04/02/2020.  Office will contact with results via phone or letter.    Testing/Procedures: Your physician has requested that you have an echocardiogram. Echocardiography is a painless test that uses sound waves to create images of your heart. It provides your doctor with information about the size and shape of your heart and how well your heart's chambers and valves are working. This procedure takes approximately one hour. There are no restrictions for this procedure - DUE JUST PRIOR TO NEXT VISIT  Follow-Up: 3 months   Any Other Special Instructions Will Be Listed Below (If Applicable).  If you need a refill on your cardiac medications before your next appointment, please call your pharmacy.

## 2020-04-23 ENCOUNTER — Encounter: Payer: Self-pay | Admitting: *Deleted

## 2020-05-24 ENCOUNTER — Telehealth: Payer: Self-pay | Admitting: Family Medicine

## 2020-05-24 MED ORDER — SIMVASTATIN 40 MG PO TABS: 40.0000 mg | ORAL_TABLET | Freq: Every day | ORAL | 1 refills | Status: DC

## 2020-05-24 NOTE — Telephone Encounter (Signed)
Medication refill request for Zocor 40 mg tablets approved and sent to [facility] pharmacy per pt request.

## 2020-05-24 NOTE — Telephone Encounter (Signed)
*  STAT* If patient is at the pharmacy, call can be transferred to refill team.   1. Which medications need to be refilled? (please list name of each medication and dose if known)  simvastatin (ZOCOR) 40 MG tablet [[id number]]   2. Which pharmacy/location (including street and city if local pharmacy) is medication to be sent to? [place]  3. Do they need a 30 day or 90 day supply?  Didn't say

## 2020-05-31 ENCOUNTER — Ambulatory Visit (INDEPENDENT_AMBULATORY_CARE_PROVIDER_SITE_OTHER): Payer: Medicare Other | Admitting: Urology

## 2020-05-31 ENCOUNTER — Other Ambulatory Visit: Payer: Self-pay

## 2020-05-31 ENCOUNTER — Encounter: Payer: Self-pay | Admitting: Urology

## 2020-05-31 VITALS — BP 140/94 | HR 94 | Temp 97.7°F | Ht 70.0 in | Wt 246.0 lb

## 2020-05-31 DIAGNOSIS — N5201 Erectile dysfunction due to arterial insufficiency: Secondary | ICD-10-CM

## 2020-05-31 DIAGNOSIS — R339 Retention of urine, unspecified: Secondary | ICD-10-CM

## 2020-05-31 DIAGNOSIS — N401 Enlarged prostate with lower urinary tract symptoms: Secondary | ICD-10-CM | POA: Diagnosis not present

## 2020-05-31 DIAGNOSIS — N138 Other obstructive and reflux uropathy: Secondary | ICD-10-CM

## 2020-05-31 LAB — URINALYSIS, ROUTINE W REFLEX MICROSCOPIC
Bilirubin, UA: NEGATIVE
Glucose, UA: NEGATIVE
Ketones, UA: NEGATIVE
Leukocytes,UA: NEGATIVE
Nitrite, UA: NEGATIVE
Protein,UA: NEGATIVE
RBC, UA: NEGATIVE
Specific Gravity, UA: 1.025 (ref 1.005–1.030)
Urobilinogen, Ur: 0.2 mg/dL (ref 0.2–1.0)
pH, UA: 6 (ref 5.0–7.5)

## 2020-05-31 MED ORDER — TADALAFIL 20 MG PO TABS: 20.0000 mg | ORAL_TABLET | Freq: Every day | ORAL | 5 refills | Status: DC | PRN

## 2020-05-31 NOTE — Progress Notes (Signed)
05/31/2020 10:00 AM   [name] [name]. [birth year] [id number]  Referring provider: Rebekah Chesterfield, NP [address] [place],  [place] [address]  followup BPH and erectile dysfunction  HPI: [name] [name] is a 54yo here for followup for BPH and erectile dysfunction. He has mild LUTS after TURP. No hematuria or dysuria IPSS 7 QOL 2. He has nocturia 1-3 based on fluid consumption. The patient continues to have issues getting and maintaining an erection. He previous tried sildenafil which failed to improve his erection. He has tried tadalafil 20mg  prn which works well currently.   PMH: Past Medical History:  Diagnosis Date  . Anxiety   . Arthritis    in lower back  . Coronary artery disease    a. 02/2008: anterior STEMI s/p DES to prox LAD, b. Myoview 2012: scar in the anterior wall with mild ischemia and EF 33%, c. neg GXT 06/2011  d. 08/2016: cath with DES to diagonal   . Depression with anxiety   . Diabetes mellitus    TYPE II  . Fatty liver    H/O ELEVATED LIVER ENZYMES  . High cholesterol   . Hyperlipidemia   . Hypertension   . Ischemic cardiomyopathy    a. Previous EF 33-35% 2012, b. improved to 45-50% by echo 06/2011  . LV (left ventricular) mural thrombus without MI (HCC)   . Myocardial infarction (HCC)   . Noncompliance    Prior hx of med noncompliance  . OSA (obstructive sleep apnea)   . Sleep apnea     Surgical History: Past Surgical History:  Procedure Laterality Date  . CARDIAC CATHETERIZATION    . CORONARY ANGIOPLASTY    . CORONARY STENT INTERVENTION N/A 08/21/2016   Procedure: CORONARY STENT INTERVENTION;  Surgeon: 10/21/2016, MD;  [facility]: [facility];  Service: Cardiovascular;  Laterality: N/A;  . CYSTOSCOPY N/A 12/04/2019   Procedure: CYSTOSCOPY;  Surgeon: 12/06/2019, MD;  Location: [facility];  Service: Urology;  Laterality: N/A;  . INTRAVASCULAR PRESSURE WIRE/FFR STUDY N/A 08/21/2016   Procedure: INTRAVASCULAR PRESSURE WIRE/FFR STUDY;   Surgeon: 10/21/2016, MD;  [facility]: [facility];  Service: Cardiovascular;  Laterality: N/A;  . LIVER BIOPSY    . MI WITH STENTS    . NASAL SEPTOPLASTY W/ TURBINOPLASTY N/A 01/25/2019   Procedure: BILATERAL TURBINATE REDUCTION;  Surgeon: 03/25/2019, MD;  Location: [facility];  Service: ENT;  Laterality: N/A;  . NASAL SINUS SURGERY    . RIGHT/LEFT HEART CATH AND CORONARY ANGIOGRAPHY N/A 08/21/2016   Procedure: Right/Left Heart Cath and Coronary Angiography;  Surgeon: 10/21/2016, MD;  Location: [facility] INVASIVE CV LAB;  Service: Cardiovascular;  Laterality: N/A;  . TRANSURETHRAL RESECTION OF PROSTATE N/A 12/04/2019   Procedure: TRANSURETHRAL RESECTION OF THE PROSTATE (TURP);  Surgeon: 12/06/2019, MD;  Location: [facility];  Service: Urology;  Laterality: N/A;    Home Medications:  Allergies as of 05/31/2020   No Known Allergies     Medication List       Accurate as of May 31, 2020 10:00 AM. If you have any questions, ask your nurse or doctor.        aspirin EC 81 MG tablet Take 81 mg by mouth daily.   carvedilol 12.5 MG tablet Commonly known as: COREG Take 1.5 tablets (18.75 mg total) by mouth 2 (two) times daily with a meal.   escitalopram 10 MG tablet Commonly known as: LEXAPRO Take 10  mg by mouth daily.   glipiZIDE 10 MG 24 hr tablet Commonly known as: GLUCOTROL XL Take 10 mg by mouth daily.   Jardiance 25 MG Tabs tablet Generic drug: empagliflozin Take 25 mg by mouth daily.   nitroGLYCERIN 0.4 MG SL tablet Commonly known as: NITROSTAT Place 0.4 mg under the tongue every 5 (five) minutes as needed for chest pain.   oxyCODONE-acetaminophen 5-325 MG tablet Commonly known as: Percocet Take 1 tablet by mouth every 4 (four) hours as needed for severe pain.   Rybelsus 3 MG Tabs Generic drug: Semaglutide Take 3 mg by mouth daily.   sacubitril-valsartan 97-103 MG Commonly known as: ENTRESTO Take 1 tablet by mouth 2 (two) times daily.    sildenafil 100 MG tablet Commonly known as: VIAGRA Take 1 tablet (100 mg total) by mouth daily as needed for erectile dysfunction.   simvastatin 40 MG tablet Commonly known as: ZOCOR Take 1 tablet (40 mg total) by mouth at bedtime.   spironolactone 25 MG tablet Commonly known as: ALDACTONE Take 0.5 tablets (12.5 mg total) by mouth daily.       Allergies: No Known Allergies  Family History: Family History  Problem Relation Age of Onset  . Coronary artery disease Other        UNKNOWN  . Heart disease Father        CAD s/p CABG  . Diabetes Mellitus II Father   . Diabetes Mellitus II Mother   . Cancer Brother     Social History:  reports that he has never smoked. He has never used smokeless tobacco. He reports that he does not drink alcohol and does not use drugs.  ROS: All other review of systems were reviewed and are negative except what is noted above in HPI  Physical Exam: BP (!) 140/94   Pulse 94   Temp 97.7 F (36.5 C) (Oral)   Ht 5\' 10"  (1.778 m)   Wt 246 lb (111.6 kg)   BMI 35.30 kg/m   Constitutional:  Alert and oriented, No acute distress. HEENT: [place] AT, moist mucus membranes.  Trachea midline, no masses. Cardiovascular: No clubbing, cyanosis, or edema. Respiratory: Normal respiratory effort, no increased work of breathing. GI: Abdomen is soft, nontender, nondistended, no abdominal masses GU: No CVA tenderness.  Lymph: No cervical or inguinal lymphadenopathy. Skin: No rashes, bruises or suspicious lesions. Neurologic: Grossly intact, no focal deficits, moving all 4 extremities. Psychiatric: Normal mood and affect.  Laboratory Data: Lab Results  Component Value Date   WBC 10.2 12/05/2019   HGB 14.2 12/05/2019   HCT 44.4 12/05/2019   MCV 91.5 12/05/2019   PLT 247 12/05/2019    Lab Results  Component Value Date   CREATININE 0.72 12/05/2019    Lab Results  Component Value Date   PSA 0.5 07/12/2019    No results found for:  TESTOSTERONE  Lab Results  Component Value Date   HGBA1C 7.4 (H) 12/04/2019    Urinalysis    Component Value Date/Time   COLORURINE AMBER (A) 05/19/2019 1600   APPEARANCEUR Clear 03/15/2020 0927   LABSPEC 1.031 (H) 05/19/2019 1600   PHURINE 6.0 05/19/2019 1600   GLUCOSEU 3+ (A) 03/15/2020 0927   HGBUR LARGE (A) 05/19/2019 1600   BILIRUBINUR Negative 03/15/2020 0927   KETONESUR 20 (A) 05/19/2019 1600   PROTEINUR Negative 03/15/2020 0927   PROTEINUR >=300 (A) 05/19/2019 1600   UROBILINOGEN 0.2 09/29/2019 0910   NITRITE Negative 03/15/2020 0927   NITRITE NEGATIVE 05/19/2019 1600  LEUKOCYTESUR Negative 03/15/2020 0927   LEUKOCYTESUR NEGATIVE 05/19/2019 1600    Lab Results  Component Value Date   LABMICR See below: 03/15/2020   WBCUA 0-5 03/15/2020   LABEPIT 0-10 03/15/2020   BACTERIA None seen 03/15/2020    Pertinent Imaging:  No results found for this or any previous visit.  No results found for this or any previous visit.  No results found for this or any previous visit.  No results found for this or any previous visit.  No results found for this or any previous visit.  No results found for this or any previous visit.  No results found for this or any previous visit.  No results found for this or any previous visit.   Assessment & Plan:    1. Benign prostatic hyperplasia with urinary obstruction -RTC 6 months with PVR - BLADDER SCAN AMB NON-IMAGING - Urinalysis, Routine w reflex microscopic  2. Erectile dysfunction -tadalafil 20mg  prn   Return in about 6 months (around 12/01/2020) for PVR.  12/03/2020, MD  [facility] [place]

## 2020-05-31 NOTE — Patient Instructions (Signed)

## 2020-05-31 NOTE — Progress Notes (Signed)
post void residual= 13 ml    Urological Symptom Review  Patient is experiencing the following symptoms: Frequent Urination Get up at night to urinate Leakage of Urine Erection problems  Review of Systems  Gastrointestinal (upper)  : Indigistestion  Gastrointestinal (lower) : Negative for lower GI symptoms  Constitutional : Negative for symptoms  Skin: Negative for skin symptoms  Eyes: Blurred vision  Ear/Nose/Throat : Negative for Ear/Nose/Throat symptoms  Hematologic/Lymphatic: Negative for Hematologic/Lymphatic symptoms  Cardiovascular : Negative for cardiovascular symptoms  Respiratory : Cough Shortness of breath  Endocrine: Excessive thirst  Musculoskeletal: Back pain  Neurological: Negative for neurological symptoms  Psychologic: Negative for psychiatric symptoms

## 2020-06-20 ENCOUNTER — Ambulatory Visit (INDEPENDENT_AMBULATORY_CARE_PROVIDER_SITE_OTHER): Payer: Medicare Other

## 2020-06-20 DIAGNOSIS — I5022 Chronic systolic (congestive) heart failure: Secondary | ICD-10-CM | POA: Diagnosis not present

## 2020-06-20 LAB — ECHOCARDIOGRAM COMPLETE
Area-P 1/2: 5.88 cm2
Calc EF: 34.2 %
MV M vel: 3.86 m/s
MV Peak grad: 59.6 mmHg
S' Lateral: 5.19 cm
Single Plane A2C EF: 33.3 %
Single Plane A4C EF: 39 %

## 2020-06-21 ENCOUNTER — Telehealth: Payer: Self-pay | Admitting: *Deleted

## 2020-06-21 DIAGNOSIS — R931 Abnormal findings on diagnostic imaging of heart and coronary circulation: Secondary | ICD-10-CM

## 2020-06-21 NOTE — Telephone Encounter (Signed)
Lesle Chris, LPN  06/19/5377 4:32 PM EDT Back to Top     Notified, copy to pcp. He is agreeable to doing this test. Order entered into epic & fwd to pcc for scheduling.

## 2020-06-21 NOTE — Telephone Encounter (Signed)
-----   Message from Netta Neat., NP sent at 06/20/2020  1:03 PM EDT ----- Please call the patient and let him know the pumping function of his heart is a little more reduced than prior echo in June 2021.  His heart is a little more stiff and noncompliant when it is trying to relax and fill with blood.  There is a shadow in the left ventricle which could be a small blood clot.  Provider is requesting a limited echocardiogram with Definity contrast to further assess to see if this is actually a blood clot or not.  If he agrees schedule a limited study with Definity contrast.  Thank you    Echo 06/20/2020 EF 25 to 30%.  Positive WMA's.  G2 DD.  Reduced global longitudinal strain.  Acoustic shadowing at LV apex with concern for mural thrombus.  Suggest a limited echo with Definity for further evaluation.  Mild mitral regurgitation  Echocardiogram 06/22/2019 EF 35%, positive for WMA's.  Left internal cavity size mildly dilated.  G1 DD.  Trivial MR.  Previous echo and February 2019 showed EF of 35 to 40%.

## 2020-06-25 ENCOUNTER — Telehealth: Payer: Self-pay | Admitting: Cardiology

## 2020-06-25 NOTE — Telephone Encounter (Signed)
Checking percert on the following patient for testing scheduled at [facility].   LIMITED ECHO WITH CONTRAST  07/08/2020

## 2020-07-08 ENCOUNTER — Ambulatory Visit (HOSPITAL_COMMUNITY)
Admission: RE | Admit: 2020-07-08 | Discharge: 2020-07-08 | Disposition: A | Discharge status: Home / Self Care | Payer: Medicare Other | Source: Ambulatory Visit | Attending: Family Medicine | Admitting: Family Medicine

## 2020-07-08 ENCOUNTER — Other Ambulatory Visit: Payer: Self-pay

## 2020-07-08 DIAGNOSIS — I340 Nonrheumatic mitral (valve) insufficiency: Secondary | ICD-10-CM

## 2020-07-08 DIAGNOSIS — R931 Abnormal findings on diagnostic imaging of heart and coronary circulation: Secondary | ICD-10-CM | POA: Diagnosis not present

## 2020-07-08 MED ORDER — PERFLUTREN LIPID MICROSPHERE
1.0000 mL | INTRAVENOUS | Status: AC | PRN
  Administered 2020-07-08: 3 mL via INTRAVENOUS
  Filled 2020-07-08: qty 10

## 2020-07-08 NOTE — Progress Notes (Signed)
*  PRELIMINARY RESULTS* Echocardiogram Limited 2-D Echocardiogram  has been performed with Definity.  Stacey Drain 07/08/2020, 2:00 PM

## 2020-07-10 ENCOUNTER — Telehealth: Payer: Self-pay

## 2020-07-10 NOTE — Telephone Encounter (Signed)
Pt notified and verbalized understanding.

## 2020-07-10 NOTE — Telephone Encounter (Signed)
-----   Message from Creig Hines, NP sent at 07/09/2020  8:39 AM EDT ----- Heart squeezing function remains low @ ~ 25%.  This echo was performed to assess for thrombus in the left ventricular apex.  There is no evidence of thrombus on this study.

## 2020-07-15 ENCOUNTER — Ambulatory Visit (INDEPENDENT_AMBULATORY_CARE_PROVIDER_SITE_OTHER): Payer: Medicare Other | Admitting: Cardiology

## 2020-07-15 ENCOUNTER — Encounter: Payer: Self-pay | Admitting: *Deleted

## 2020-07-15 ENCOUNTER — Other Ambulatory Visit: Payer: Self-pay

## 2020-07-15 ENCOUNTER — Encounter: Payer: Self-pay | Admitting: Cardiology

## 2020-07-15 VITALS — BP 130/86 | HR 78 | Ht 70.0 in | Wt 249.4 lb

## 2020-07-15 DIAGNOSIS — I5022 Chronic systolic (congestive) heart failure: Secondary | ICD-10-CM

## 2020-07-15 DIAGNOSIS — I2510 Atherosclerotic heart disease of native coronary artery without angina pectoris: Secondary | ICD-10-CM | POA: Diagnosis not present

## 2020-07-15 DIAGNOSIS — I255 Ischemic cardiomyopathy: Secondary | ICD-10-CM | POA: Diagnosis not present

## 2020-07-15 MED ORDER — CARVEDILOL 12.5 MG PO TABS: 12.5000 mg | ORAL_TABLET | Freq: Two times a day (BID) | ORAL | Status: DC

## 2020-07-15 NOTE — Patient Instructions (Signed)
Medication Instructions:  Decrease Coreg to 12.5mg  twice a day. Continue all other medications.    Labwork: none  Testing/Procedures: none  Follow-Up: 1 month   Any Other Special Instructions Will Be Listed Below (If Applicable). You have been referred to:  EP - Dr. Johney Frame - evaluation for ICD   If you need a refill on your cardiac medications before your next appointment, please call your pharmacy.

## 2020-07-15 NOTE — Progress Notes (Signed)
Clinical Summary [name] [name] is a 55 y.o.male seen today for follow up of the following medical problems.   1. CAD/ICM/Chronic systolic HF - history of anterior STEMI 02/2008, received DES to LAD. LVEF at that time was 35% - 2012 myoview anterior scar with mild ischemia - 06/2011 echo LVEF 45-50% - 06/2011 GXT no ischemia  - 07/2016 echo shows LVEF down to 30-35%, diffuse hypokinesis with akinesis of anerior and anteroseptal walls. Evidence of LV thrombus - with drop in LVEF and ongoing chest pain was referred for cath  08/2016 cath: see report below, received DES to D2 99%. Ok to stop ASA once coumadin is therapeutic per interventional cardiology, continue plavix.  11/2016 echo LVEF 35%, apical thrombus - 02/2017 echo LVEF 35-40%, restrictive, no clear thrombus    Jan 2020 nuclear stress: large anterior/anteroseptal scar, LVEF 22%.        - over last few visits entresto has been titrate, now up to 97/102mg  bid. Reports pcp had recent labs.  - 06/2020 echo LVEF 25% - was on jardiance, stopped causing incontinence related to prior urology procedure.  - some ongoing fatigue, feeling drained.      2. History of prior LV thrombus - noted on 11/2016 echo -repeat echo 02/2017 showed resolution of clot, coumadin was stopped.  -06/2020 echo no thrombus     3. HTN -  compliant with meds     4. OSA - has prior diagnosis. Not wearing cpap due to discomfort. SIgnificant daytime somonlence.  - pcp referred again for sleep study but could not afford.     SH: he is on disability Past Medical History:  Diagnosis Date   Anxiety    Arthritis    in lower back   Coronary artery disease    a. 02/2008: anterior STEMI s/p DES to prox LAD, b. Myoview 2012: scar in the anterior wall with mild ischemia and EF 33%, c. neg GXT 06/2011  d. 08/2016: cath with DES to diagonal    Depression with anxiety    Diabetes mellitus    TYPE II   Fatty liver    H/O ELEVATED LIVER ENZYMES   High cholesterol     Hyperlipidemia    Hypertension    Ischemic cardiomyopathy    a. Previous EF 33-35% 2012, b. 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)    Myocardial infarction (HCC)    Noncompliance    Prior hx of med noncompliance   OSA (obstructive sleep apnea)    Sleep apnea      No Known Allergies   Current Outpatient Medications  Medication Sig Dispense Refill   aspirin EC 81 MG tablet Take 81 mg by mouth daily.     carvedilol (COREG) 12.5 MG tablet Take 1.5 tablets (18.75 mg total) by mouth 2 (two) times daily with a meal. 270 tablet 1   escitalopram (LEXAPRO) 10 MG tablet Take 10 mg by mouth daily.     glipiZIDE (GLUCOTROL XL) 10 MG 24 hr tablet Take 10 mg by mouth daily.     JARDIANCE 25 MG TABS tablet Take 25 mg by mouth daily.     nitroGLYCERIN (NITROSTAT) 0.4 MG SL tablet Place 0.4 mg under the tongue every 5 (five) minutes as needed for chest pain.     oxyCODONE-acetaminophen (PERCOCET) 5-325 MG tablet Take 1 tablet by mouth every 4 (four) hours as needed for severe pain. 15 tablet 0   sacubitril-valsartan (  ENTRESTO) 97-103 MG Take 1 tablet by mouth 2 (two) times daily. 60 tablet 6   Semaglutide (RYBELSUS) 3 MG TABS Take 3 mg by mouth daily.     sildenafil (VIAGRA) 100 MG tablet Take 1 tablet (100 mg total) by mouth daily as needed for erectile dysfunction. 6 tablet 0   simvastatin (ZOCOR) 40 MG tablet Take 1 tablet (40 mg total) by mouth at bedtime. 90 tablet 1   spironolactone (ALDACTONE) 25 MG tablet Take 0.5 tablets (12.5 mg total) by mouth daily. 45 tablet 1   tadalafil (CIALIS) 20 MG tablet Take 1 tablet (20 mg total) by mouth daily as needed. 10 tablet 5   No current facility-administered medications for this visit.     Past Surgical History:  Procedure Laterality Date   CARDIAC CATHETERIZATION     CORONARY ANGIOPLASTY     CORONARY STENT INTERVENTION N/A 08/21/2016   Procedure: CORONARY STENT INTERVENTION;  Surgeon: Kathleene Hazel, MD;  [facility]: [facility];  Service: Cardiovascular;  Laterality: N/A;   CYSTOSCOPY N/A 12/04/2019   Procedure: CYSTOSCOPY;  Surgeon: Malen Gauze, MD;  Location: [facility];  Service: Urology;  Laterality: N/A;   INTRAVASCULAR PRESSURE WIRE/FFR STUDY N/A 08/21/2016   Procedure: INTRAVASCULAR PRESSURE WIRE/FFR STUDY;  Surgeon: Kathleene Hazel, MD;  Location: [facility];  Service: Cardiovascular;  Laterality: N/A;   LIVER BIOPSY     MI WITH STENTS     NASAL SEPTOPLASTY W/ TURBINOPLASTY N/A 01/25/2019   Procedure: BILATERAL TURBINATE REDUCTION;  Surgeon: Newman Pies, MD;  Location: [facility];  Service: ENT;  Laterality: N/A;   NASAL SINUS SURGERY     RIGHT/LEFT HEART CATH AND CORONARY ANGIOGRAPHY N/A 08/21/2016   Procedure: Right/Left Heart Cath and Coronary Angiography;  Surgeon: Kathleene Hazel, MD;  Location: [facility];  Service: Cardiovascular;  Laterality: N/A;   TRANSURETHRAL RESECTION OF PROSTATE N/A 12/04/2019   Procedure: TRANSURETHRAL RESECTION OF THE PROSTATE (TURP);  Surgeon: Malen Gauze, MD;  Location: [facility];  Service: Urology;  Laterality: N/A;     No Known Allergies    Family History  Problem Relation Age of Onset   Coronary artery disease Other        UNKNOWN   Heart disease Father        CAD s/p CABG   Diabetes Mellitus II Father    Diabetes Mellitus II Mother    Cancer Brother      Social History [name] [name] reports that he has never smoked. He has never used smokeless tobacco. [name] [name] reports no history of alcohol use.   Review of Systems CONSTITUTIONAL: +fatigue HEENT: Eyes: No visual loss, blurred vision, double vision or yellow sclerae.No hearing loss, sneezing, congestion, runny nose or sore throat.  SKIN: No rash or itching.  CARDIOVASCULAR: per hpi RESPIRATORY: No shortness of breath, cough or sputum.  GASTROINTESTINAL: No anorexia, nausea, vomiting or diarrhea. No abdominal pain or blood.   GENITOURINARY: No burning on urination, no polyuria NEUROLOGICAL: No headache, dizziness, syncope, paralysis, ataxia, numbness or tingling in the extremities. No change in bowel or bladder control.  MUSCULOSKELETAL: No muscle, back pain, joint pain or stiffness.  LYMPHATICS: No enlarged nodes. No history of splenectomy.  PSYCHIATRIC: No history of depression or anxiety.  ENDOCRINOLOGIC: No reports of sweating, cold or heat intolerance. No polyuria or polydipsia.  Marland Kitchen   Physical Examination Today's Vitals   07/15/20 0827  BP: 130/86  Pulse: 78  SpO2: 98%  Weight: 249 lb 6.4 oz (113.1 kg)  Height: 5\' 10"  (1.778 m)   Body mass index is 35.79 kg/m.  Gen: resting comfortably, no acute distress HEENT: no scleral icterus, pupils equal round and reactive, no palptable cervical adenopathy,  CV: RRR, no m/r/g, no jvd Resp: Clear to auscultation bilaterally GI: abdomen is soft, non-tender, non-distended, normal bowel sounds, no hepatosplenomegaly MSK: extremities are warm, no edema.  Skin: warm, no rash Neuro:  no focal deficits Psych: appropriate affect   Diagnostic Studies  6./2013 echo Study Conclusions  - Left ventricle: Systolic function was mildly reduced. The   estimated ejection fraction was in the range of 45% to   50%. - Left atrium: The atrium was mildly dilated.   08/07/16 echo Study Conclusions   - Left ventricle: LV systolic function is depressed at   approximately 30 to 35% with severe hypokinesis/akinesis of the   inferior, septal, distal lateral and apical walls. Cannot exclude   mural thrombus at apex. Suggest limited echo with Definity to   define The cavity size was normal. Wall thickness was normal. - Mitral valve: There was mild regurgitation. - Left atrium: The atrium was mildly dilated.   08/14/16 echo Study Conclusions   - Procedure narrative: Transthoracic echocardiography. Image   quality was adequate. The study was technically difficult.    Intravenous contrast (Definity) was administered. - Left ventricle: Systolic function was moderately to severely   reduced. The estimated ejection fraction was in the range of 30%   to 35%. Diffuse hypokinesis. There is akinesis of the   anteroseptal, anterior, and apical myocardium. - Right ventricle: Systolic function was normal.   Impressions:   - Limited study with Definity contrast. LVEF is in the range of   30-35%. There is a loosely organized LV apical thrombus noted.   Diffuse hypokinesis overall with akinesis of the anterior,   anteroseptal, and apical myocardium. RV contraction normal based   on limited views.   08/2016 cath Mid RCA-2 lesion, 30 %stenosed. Mid RCA-1 lesion, 30 %stenosed. Ost Ramus to Ramus lesion, 20 %stenosed. Ost LAD to Prox LAD lesion, 10 %stenosed. Prox LAD to Mid LAD lesion, 60 %stenosed. A STENT SYNERGY DES 2.25X12 drug eluting stent was successfully placed. 2nd Diag lesion, 99 %stenosed. Post intervention, there is a 0% residual stenosis.   1. Patent stent proximal LAD 2. Moderate stenosis mid LAD. FFR was 0.84 suggesting the stenosis was not flow limiting. 3. The Diagonal Kaynan Klonowski is a moderate caliber vessel with a 99% stenosis. 4. Successful PTCA/DES x 1 Diagonal Raeshaun Simson 5. Mild disease RCA   Recommendations: Will continue ASA and Plavix for now. He will need to be started on coumadin tomorrow before discharge for his LV thrombus. He can stop ASA when his INR is therapeutic on coumadin since he was not an ACS. I would plan long term coumadin for LV thrombus and Plavix for at least one year given placement of DES. Continue statin and beta blocker.    02/2017 echo Study Conclusions   - Left ventricle: The cavity size was normal. Wall thickness was   increased in a pattern of mild LVH. Systolic function was   moderately reduced. The estimated ejection fraction was in the   range of 35% to 40%. No definitive thrombus seen with contrast    enhancement. Diffuse hypokinesis. Diastolic dysfunction with   restrictive physiology. - Regional wall motion abnormality: Akinesis of the mid anterior   and basal-mid anteroseptal myocardium. - Right ventricle: Systolic function  was reduced.     Jan 2020 nuclear stress No diagnostic ST segment changes to indicate ischemia. Large, dense, anterior and anteroseptal defect extending from apex to base consistent with infarct scar. This is a high risk study. Nuclear stress EF: 22%. Consistent with ischemic cardiomyopathy and large anterior/anteroseptal infarct scar, no significant ischemia.   Assessment and Plan  1. CAD/ICM/chronic systolic HF -  Fatigue on higher coreg dosing. Prior issues with orthostatic symptoms had limited medical therapy but these have resolved and have made recent titrations.  - of note LVEF dropped to 35% in 2018. Had cath at time time with PCI as reported above. LVEF has not improved since that time and has further declined. Jan 2020 nuclear stress with just scar, no ischemia.   - with recent fatigue try lowering coreg to 12.5mg  bid. If not improved other option could be his OSA, has had troubles affodring cpap - continue current meds, obtain pcp labs, pending on Cr and K may titrate aldactone - he reports his pcp and urologist ask him to stop jardiance due to incontinence, urinary frequency - refer to EP to consider ICD     F/u 1 month. May titrate up coreg again if symptoms not improved. Depending on pcp labs may titrate aldactone.    Antoine Poche, M.D.

## 2020-08-18 NOTE — Progress Notes (Signed)
Cardiology Office Note  Date: 08/19/2020   ID: [name] [name]., DOB 1965/06/13, MRN [id number]  PCP:  Rebekah Chesterfield, NP  Cardiologist:  [name], MD Electrophysiologist:  None   Chief Complaint: Follow up  History of Present Illness: [name] [name]. is a 55 y.o. male with a history of CAD, ICM, STEMI. DM2, HLD, HTN, OSA, fatty liver disease.  Last seen by Dr Wyline Mood on 07/15/2020. He reported ongoing fatigue and feeling drained. Previous LV thrombus with echo on 06/2020 showing thrombus resolved. He was compliant with anti-hypertensive medications. Was not using CPAP to to discomfort. Plan was to try lowering Coreg to 12.5 mg po bid. If not improved other option could be his OSA. Has troubles affording CPAP. Obtain PCP labs. Pending Crt and K may titrate Aldactone. His PCP and urologist asked him to stop Jardiance. He was referred to EP for consideration of ICD.  He is here for follow-up today.  States he still feels a little tired and fatigued with some decreased energy.  States he has trouble getting out of bed in the morning and getting going.  Also states he can become more short of breath easily especially when getting out in the hot weather.  States he is not sure if it is due to the decreased pumping function of his heart or other issues. Recent creatinine from PCP office 1.01, potassium 4.3 on lab work from 07/07/2020.  States he does not notice any significant difference after decreasing carvedilol dosage.  He has an upcoming visit with Dr. Johney Frame for consideration of ICD on September 02, 2020.  Otherwise he denies any anginal symptoms, orthostatic symptoms, CVA or TIA-like symptoms, palpitations or arrhythmias, PND, orthopnea, weight gain, lower extremity edema.  Past Medical History:  Diagnosis Date   Anxiety    Arthritis    in lower back   Coronary artery disease    a. 02/2008: anterior STEMI s/p DES to prox LAD, b. Myoview 2012: scar in the anterior wall with mild  ischemia and EF 33%, c. neg GXT 06/2011  d. 08/2016: cath with DES to diagonal    Depression with anxiety    Diabetes mellitus    TYPE II   Fatty liver    H/O ELEVATED LIVER ENZYMES   High cholesterol    Hyperlipidemia    Hypertension    Ischemic cardiomyopathy    a. Previous EF 33-35% 2012, b. 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)    Myocardial infarction (HCC)    Noncompliance    Prior hx of med noncompliance   OSA (obstructive sleep apnea)    Sleep apnea     Past Surgical History:  Procedure Laterality Date   CARDIAC CATHETERIZATION     CORONARY ANGIOPLASTY     CORONARY STENT INTERVENTION N/A 08/21/2016   Procedure: CORONARY STENT INTERVENTION;  Surgeon: Kathleene Hazel, MD;  [facility];  Service: Cardiovascular;  Laterality: N/A;   CYSTOSCOPY N/A 12/04/2019   Procedure: CYSTOSCOPY;  Surgeon: Malen Gauze, MD;  Location: [facility];  Service: Urology;  Laterality: N/A;   INTRAVASCULAR PRESSURE WIRE/FFR STUDY N/A 08/21/2016   Procedure: INTRAVASCULAR PRESSURE WIRE/FFR STUDY;  Surgeon: Kathleene Hazel, MD;  [facility];  Service: Cardiovascular;  Laterality: N/A;   LIVER BIOPSY     MI WITH STENTS     NASAL SEPTOPLASTY W/ TURBINOPLASTY N/A 01/25/2019   Procedure: BILATERAL  TURBINATE REDUCTION;  Surgeon: Newman Pies, MD;  Location[facility];  Service: ENT;  Laterality: N/A;   NASAL SINUS SURGERY     RIGHT/LEFT HEART CATH AND CORONARY ANGIOGRAPHY N/A 08/21/2016   Procedure: Right/Left Heart Cath and Coronary Angiography;  Surgeon: Kathleene Hazel, MD;  [facility];  Service: Cardiovascular;  Laterality: N/A;   TRANSURETHRAL RESECTION OF PROSTATE N/A 12/04/2019   Procedure: TRANSURETHRAL RESECTION OF THE PROSTATE (TURP);  Surgeon: Malen Gauze, MD;  Location: [facility];  Service: Urology;  Laterality: N/A;    Current Outpatient Medications  Medication Sig  Dispense Refill   aspirin EC 81 MG tablet Take 81 mg by mouth daily.     BD PEN NEEDLE NANO 2ND GEN 32G X 4 MM MISC USE AS DIRECTED ONCE DAILY WITH SOLIQUA     carvedilol (COREG) 25 MG tablet Take 25 mg by mouth 2 (two) times daily.     escitalopram (LEXAPRO) 20 MG tablet Take 20 mg by mouth daily.     glipiZIDE (GLUCOTROL XL) 10 MG 24 hr tablet Take 10 mg by mouth daily.     nitroGLYCERIN (NITROSTAT) 0.4 MG SL tablet Place 0.4 mg under the tongue every 5 (five) minutes as needed for chest pain.     oxyCODONE-acetaminophen (PERCOCET) 5-325 MG tablet Take 1 tablet by mouth every 4 (four) hours as needed for severe pain. 15 tablet 0   sacubitril-valsartan (ENTRESTO) 97-103 MG Take 1 tablet by mouth 2 (two) times daily. 60 tablet 6   Semaglutide (RYBELSUS) 3 MG TABS Take 3 mg by mouth daily.     simvastatin (ZOCOR) 40 MG tablet Take 1 tablet (40 mg total) by mouth at bedtime. 90 tablet 1   tadalafil (CIALIS) 20 MG tablet Take 1 tablet (20 mg total) by mouth daily as needed. 10 tablet 5   TOUJEO MAX SOLOSTAR 300 UNIT/ML Solostar Pen SMARTSIG:40 Unit(s) SUB-Q Daily     spironolactone (ALDACTONE) 25 MG tablet Take 1 tablet (25 mg total) by mouth daily. 90 tablet 3   No current facility-administered medications for this visit.   Allergies:  Patient has no known allergies.   Social History: The patient  reports that he has never smoked. He has never used smokeless tobacco. He reports that he does not drink alcohol and does not use drugs.   Family History: The patient's family history includes Cancer in his brother; Coronary artery disease in an other family member; Diabetes Mellitus II in his father and mother; Heart disease in his father.   ROS:  Please see the history of present illness. Otherwise, complete review of systems is positive for none.  All other systems are reviewed and negative.   Physical Exam: VS:  BP 120/82   Pulse 76   Ht 5\' 10"  (1.778 m)   Wt 248 lb 12.8 oz (112.9 kg)   SpO2  95%   BMI 35.70 kg/m , BMI Body mass index is 35.7 kg/m.  Wt Readings from Last 3 Encounters:  08/19/20 248 lb 12.8 oz (112.9 kg)  07/15/20 249 lb 6.4 oz (113.1 kg)  05/31/20 246 lb (111.6 kg)    General: Patient appears comfortable at rest. Neck: Supple, no elevated JVP or carotid bruits, no thyromegaly. Lungs: Clear to auscultation, nonlabored breathing at rest. Cardiac: Regular rate and rhythm, no S3 or significant systolic murmur, no pericardial rub. Extremities: No pitting edema, distal pulses 2+. Skin: Warm and dry. Musculoskeletal: No kyphosis. Neuropsychiatric: Alert and oriented x3, affect grossly  appropriate.  ECG:    Recent Labwork: 12/05/2019: BUN 17; Creatinine, Ser 0.72; Hemoglobin 14.2; Platelets 247; Potassium 3.8; Sodium 133     Component Value Date/Time   CHOL 216 (H) 08/07/2016 1240   TRIG 118 08/07/2016 1240   HDL 34 (L) 08/07/2016 1240   CHOLHDL 6.4 08/07/2016 1240   VLDL 24 08/07/2016 1240   LDLCALC 158 (H) 08/07/2016 1240    Other Studies Reviewed Today:  Echocardiogram 07/08/2020 Limited echo 1. Limited echo. LVEF is severely depressed with severe hypokineiss/akinesis of the apex; severe hypokinesis of the anteiror, mid/distal inferior, inferoseptal, distal anterior walls No thrombus noted at apex. Left ventricular ejection fraction, by estimation, is 25%%. 2. Right ventricular systolic function is normal. The right ventricular size is normal.   Echocardiogram 06/20/2020  1. Left ventricular ejection fraction, by estimation, is 25 to 30%. The left ventricle has severely decreased function. The left ventricle demonstrates regional wall motion abnormalities (see scoring diagram/findings for description). The left ventricular internal cavity size was mildly dilated. Left ventricular diastolic parameters are consistent with Grade II diastolic dysfunction (pseudonormalization). Reduced global longitudinal strain of -8.8%. 2. Acoustic shadowing at LV  apex with concern for possible mural thrombus. Suggest limited echocardiogram with Definity for further evaluation. 3. Right ventricular systolic function is normal. The right ventricular size is normal. Tricuspid regurgitation signal is inadequate for assessing PA pressure. 4. Left atrial size was mildly dilated. 5. The mitral valve is degenerative. Mild mitral valve regurgitation. 6. The aortic valve is tricuspid. Aortic valve regurgitation is not visualized. 7. The inferior vena cava is normal in size with greater than 50% respiratory variability, suggesting right atrial pressure of 3 mmHg. Comparison(s): Echocardiogram done 06/22/19 showed an EF of 35%.   6./2013 echo Study Conclusions  - Left ventricle: Systolic function was mildly reduced. The   estimated ejection fraction was in the range of 45% to   50%. - Left atrium: The atrium was mildly dilated.   08/07/16 echo Study Conclusions   - Left ventricle: LV systolic function is depressed at   approximately 30 to 35% with severe hypokinesis/akinesis of the   inferior, septal, distal lateral and apical walls. Cannot exclude   mural thrombus at apex. Suggest limited echo with Definity to   define The cavity size was normal. Wall thickness was normal. - Mitral valve: There was mild regurgitation. - Left atrium: The atrium was mildly dilated.   08/14/16 echo Study Conclusions   - Procedure narrative: Transthoracic echocardiography. Image   quality was adequate. The study was technically difficult.   Intravenous contrast (Definity) was administered. - Left ventricle: Systolic function was moderately to severely   reduced. The estimated ejection fraction was in the range of 30%   to 35%. Diffuse hypokinesis. There is akinesis of the   anteroseptal, anterior, and apical myocardium. - Right ventricle: Systolic function was normal.   Impressions:   - Limited study with Definity contrast. LVEF is in the range of   30-35%. There  is a loosely organized LV apical thrombus noted.   Diffuse hypokinesis overall with akinesis of the anterior,   anteroseptal, and apical myocardium. RV contraction normal based   on limited views.   08/2016 cath Mid RCA-2 lesion, 30 %stenosed. Mid RCA-1 lesion, 30 %stenosed. Ost Ramus to Ramus lesion, 20 %stenosed. Ost LAD to Prox LAD lesion, 10 %stenosed. Prox LAD to Mid LAD lesion, 60 %stenosed. A STENT SYNERGY DES 2.25X12 drug eluting stent was successfully placed. 2nd Diag lesion, 99 %stenosed. Post  intervention, there is a 0% residual stenosis.   1. Patent stent proximal LAD 2. Moderate stenosis mid LAD. FFR was 0.84 suggesting the stenosis was not flow limiting. 3. The Diagonal branch is a moderate caliber vessel with a 99% stenosis. 4. Successful PTCA/DES x 1 Diagonal branch 5. Mild disease RCA   Recommendations: Will continue ASA and Plavix for now. He will need to be started on coumadin tomorrow before discharge for his LV thrombus. He can stop ASA when his INR is therapeutic on coumadin since he was not an ACS. I would plan long term coumadin for LV thrombus and Plavix for at least one year given placement of DES. Continue statin and beta blocker.    02/2017 echo Study Conclusions   - Left ventricle: The cavity size was normal. Wall thickness was   increased in a pattern of mild LVH. Systolic function was   moderately reduced. The estimated ejection fraction was in the   range of 35% to 40%. No definitive thrombus seen with contrast   enhancement. Diffuse hypokinesis. Diastolic dysfunction with   restrictive physiology. - Regional wall motion abnormality: Akinesis of the mid anterior   and basal-mid anteroseptal myocardium. - Right ventricle: Systolic function was reduced.     Jan 2020 nuclear stress No diagnostic ST segment changes to indicate ischemia. Large, dense, anterior and anteroseptal defect extending from apex to base consistent with infarct scar. This is a  high risk study. Nuclear stress EF: 22%. Consistent with ischemic cardiomyopathy and large anterior/anteroseptal infarct scar, no significant ischemia.      Assessment and Plan:  1. Chronic systolic heart failure (HCC)   2. CAD in native artery   3. Essential hypertension, benign    1. Chronic systolic heart failure (HCC) Most recent echocardiogram showed EF of 25% on 07/08/2020 limited echo.  Continue Entresto 97/103 p.o. twice daily.  Continue carvedilol 12.5 mg p.o. twice daily.  Increase spironolactone to 25 mg p.o. daily.  Please get BMP and magnesium in 2 weeks after starting increased dose.  He has an upcoming appointment with the EP Dr. Johney Frame on September 02 2020 for consideration of ICD.  2. CAD in native artery Denies any anginal or exertional symptoms currently.  Continue aspirin 81 mg daily, sublingual nitroglycerin 0.4 mg as needed.  Continue simvastatin 40 mg daily.    Medication Adjustments/Labs and Tests Ordered: Current medicines are reviewed at length with the patient today.  Concerns regarding medicines are outlined above.   Disposition: Follow-up with Dr. Wyline Mood or APP 3 months  Signed, Rennis Harding, NP 08/19/2020 10:34 AM    [facility] [address] [place], [place], [place] [address] Phone: [phone]; Fax: [phone]

## 2020-08-19 ENCOUNTER — Ambulatory Visit (INDEPENDENT_AMBULATORY_CARE_PROVIDER_SITE_OTHER): Payer: Medicare Other | Admitting: Family Medicine

## 2020-08-19 ENCOUNTER — Encounter: Payer: Self-pay | Admitting: Family Medicine

## 2020-08-19 ENCOUNTER — Other Ambulatory Visit: Payer: Self-pay

## 2020-08-19 VITALS — BP 120/82 | HR 76 | Ht 70.0 in | Wt 248.8 lb

## 2020-08-19 DIAGNOSIS — I10 Essential (primary) hypertension: Secondary | ICD-10-CM | POA: Diagnosis not present

## 2020-08-19 DIAGNOSIS — I5022 Chronic systolic (congestive) heart failure: Secondary | ICD-10-CM | POA: Diagnosis not present

## 2020-08-19 DIAGNOSIS — I2510 Atherosclerotic heart disease of native coronary artery without angina pectoris: Secondary | ICD-10-CM | POA: Diagnosis not present

## 2020-08-19 MED ORDER — SPIRONOLACTONE 25 MG PO TABS: 25.0000 mg | ORAL_TABLET | Freq: Every day | ORAL | 3 refills | Status: DC

## 2020-08-19 NOTE — Patient Instructions (Signed)
Medication Instructions:  Increase Spironolactone to 25mg  daily.  Continue all other medications.    Labwork: BMET, Mg - orders given today.  Please do in 2 weeks (around 09/02/2020).  Office will contact with results via phone or letter.    Testing/Procedures: none  Follow-Up: 3 months   Any Other Special Instructions Will Be Listed Below (If Applicable).  If you need a refill on your cardiac medications before your next appointment, please call your pharmacy.

## 2020-09-02 ENCOUNTER — Ambulatory Visit (INDEPENDENT_AMBULATORY_CARE_PROVIDER_SITE_OTHER): Payer: Medicare Other | Admitting: Internal Medicine

## 2020-09-02 ENCOUNTER — Other Ambulatory Visit: Payer: Self-pay

## 2020-09-02 VITALS — BP 110/78 | HR 75 | Ht 70.0 in | Wt 250.0 lb

## 2020-09-02 DIAGNOSIS — I5022 Chronic systolic (congestive) heart failure: Secondary | ICD-10-CM | POA: Diagnosis not present

## 2020-09-02 DIAGNOSIS — I255 Ischemic cardiomyopathy: Secondary | ICD-10-CM

## 2020-09-02 NOTE — Progress Notes (Signed)
Electrophysiology Office Note   Date:  09/02/2020   ID:  [name] [name]., DOB 1965/01/29, MRN [id number]  PCP:  Rebekah Chesterfield, NP  Cardiologist:  Dr Wyline Mood Primary Electrophysiologist: Hillis Range, MD    CC: CHF   History of Present Illness: [name] [name]. is a 55 y.o. male who presents today for electrophysiology evaluation.   He is referred by Dr Wyline Mood for EP consultation regarding risk of sudden death.  The patient had anterior STEMI 02/2008 with PCI of LAD at that time.  He has been treated with medical therapy with further decline in EF. He has a large anterior /anteroseptal scar with EF 25% by echo 6/22 He has SOB with moderate activity.  He also has OSA but has not tolerated CPAP. He has "extreme fatigue".  He is not very active.    Today, he denies symptoms of palpitations, chest pain,  orthopnea, PND, lower extremity edema, claudication, dizziness, presyncope, syncope, bleeding, or neurologic sequela. The patient is tolerating medications without difficulties and is otherwise without complaint today.    Past Medical History:  Diagnosis Date   Anxiety    Arthritis    in lower back   Coronary artery disease    a. 02/2008: anterior STEMI s/p DES to prox LAD, b. Myoview 2012: scar in the anterior wall with mild ischemia and EF 33%, c. neg GXT 06/2011  d. 08/2016: cath with DES to diagonal    Depression with anxiety    Diabetes mellitus    TYPE II   Fatty liver    H/O ELEVATED LIVER ENZYMES   High cholesterol    Hyperlipidemia    Hypertension    Ischemic cardiomyopathy    a. Previous EF 33-35% 2012, b. 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)    Myocardial infarction (HCC)    Noncompliance    Prior hx of med noncompliance   OSA (obstructive sleep apnea)    Sleep apnea    Past Surgical History:  Procedure Laterality Date   CARDIAC CATHETERIZATION     CORONARY ANGIOPLASTY     CORONARY STENT INTERVENTION N/A  08/21/2016   Procedure: CORONARY STENT INTERVENTION;  Surgeon: Kathleene Hazel, MD;  [facility];  Service: Cardiovascular;  Laterality: N/A;   CYSTOSCOPY N/A 12/04/2019   Procedure: CYSTOSCOPY;  Surgeon: Malen Gauze, MD;  Location: [facility];  Service: Urology;  Laterality: N/A;   INTRAVASCULAR PRESSURE WIRE/FFR STUDY N/A 08/21/2016   Procedure: INTRAVASCULAR PRESSURE WIRE/FFR STUDY;  Surgeon: Kathleene Hazel, MD;  [facility];  Service: Cardiovascular;  Laterality: N/A;   LIVER BIOPSY     MI WITH STENTS     NASAL SEPTOPLASTY W/ TURBINOPLASTY N/A 01/25/2019   Procedure: BILATERAL TURBINATE REDUCTION;  Surgeon: Newman Pies, MD;  Location: [facility];  Service: ENT;  Laterality: N/A;   NASAL SINUS SURGERY     RIGHT/LEFT HEART CATH AND CORONARY ANGIOGRAPHY N/A 08/21/2016   Procedure: Right/Left Heart Cath and Coronary Angiography;  Surgeon: Kathleene Hazel, MD;  [facility];  Service: Cardiovascular;  Laterality: N/A;   TRANSURETHRAL RESECTION OF PROSTATE N/A 12/04/2019   Procedure: TRANSURETHRAL RESECTION OF THE PROSTATE (TURP);  Surgeon: Malen Gauze, MD;  Location: [facility];  Service: Urology;  Laterality: N/A;     Current Outpatient Medications  Medication Sig Dispense Refill   aspirin EC 81 MG tablet Take 81 mg  by mouth daily.     BD PEN NEEDLE NANO 2ND GEN 32G X 4 MM MISC USE AS DIRECTED ONCE DAILY WITH SOLIQUA     carvedilol (COREG) 25 MG tablet Take 25 mg by mouth 2 (two) times daily.     escitalopram (LEXAPRO) 20 MG tablet Take 20 mg by mouth daily.     glipiZIDE (GLUCOTROL XL) 10 MG 24 hr tablet Take 10 mg by mouth daily.     nitroGLYCERIN (NITROSTAT) 0.4 MG SL tablet Place 0.4 mg under the tongue every 5 (five) minutes as needed for chest pain.     oxyCODONE-acetaminophen (PERCOCET) 5-325 MG tablet Take 1 tablet by mouth every 4 (four) hours as needed for severe pain. 15 tablet 0   sacubitril-valsartan (ENTRESTO)  97-103 MG Take 1 tablet by mouth 2 (two) times daily. 60 tablet 6   Semaglutide (RYBELSUS) 3 MG TABS Take 3 mg by mouth daily.     simvastatin (ZOCOR) 40 MG tablet Take 1 tablet (40 mg total) by mouth at bedtime. 90 tablet 1   spironolactone (ALDACTONE) 25 MG tablet Take 1 tablet (25 mg total) by mouth daily. 90 tablet 3   tadalafil (CIALIS) 20 MG tablet Take 1 tablet (20 mg total) by mouth daily as needed. 10 tablet 5   TOUJEO MAX SOLOSTAR 300 UNIT/ML Solostar Pen SMARTSIG:40 Unit(s) SUB-Q Daily     No current facility-administered medications for this visit.    Allergies:   Patient has no known allergies.   Social History:  The patient  reports that he has never smoked. He has never used smokeless tobacco. He reports that he does not drink alcohol and does not use drugs.   Family History:  The patient's family history includes Cancer in his brother; Coronary artery disease in an other family member; Diabetes Mellitus II in his father and mother; Heart disease in his father.    ROS:  Please see the history of present illness.   All other systems are personally reviewed and negative.    PHYSICAL EXAM: VS:  BP 110/78   Pulse 75   Ht 5\' 10"  (1.778 m)   Wt 250 lb (113.4 kg)   SpO2 93%   BMI 35.87 kg/m  , BMI Body mass index is 35.87 kg/m. GEN: Well nourished, well developed, in no acute distress HEENT: normal Neck: no JVD, carotid bruits, or masses Cardiac: RRR; no murmurs, rubs, or gallops,no edema  Respiratory:  clear to auscultation bilaterally, normal work of breathing GI: soft, nontender, nondistended, + BS MS: no deformity or atrophy Skin: warm and dry  Neuro:  Strength and sensation are intact Psych: euthymic mood, full affect  EKG:  EKG is ordered today. The ekg ordered today is personally reviewed and shows sinus rhythm 75 bpm, anteroseptal MI pattern   Recent Labs: 12/05/2019: BUN 17; Creatinine, Ser 0.72; Hemoglobin 14.2; Platelets 247; Potassium 3.8; Sodium 133   personally reviewed   Lipid Panel     Component Value Date/Time   CHOL 216 (H) 08/07/2016 1240   TRIG 118 08/07/2016 1240   HDL 34 (L) 08/07/2016 1240   CHOLHDL 6.4 08/07/2016 1240   VLDL 24 08/07/2016 1240   LDLCALC 158 (H) 08/07/2016 1240   personally reviewed   Wt Readings from Last 3 Encounters:  09/02/20 250 lb (113.4 kg)  08/19/20 248 lb 12.8 oz (112.9 kg)  07/15/20 249 lb 6.4 oz (113.1 kg)      Other studies personally reviewed: Additional studies/ records that were reviewed  today include: echo 6/22, cardiology notes  Review of the above records today demonstrates: as above   ASSESSMENT AND PLAN:  1.  Ischemic CM/ CAD/ chronic systolic dysfunction The patient has an ischemic CM (EF 25%), NYHA Class III CHF, and CAD.  He is referred by Dr Wyline Mood for risk stratification of sudden death and consideration of ICD implantation.  At this time, he meets MADIT II/ SCD-HeFT criteria for ICD implantation for primary prevention of sudden death.  Given QRS < 130 msec, he does not meet criteria for CRT.   I have had a thorough discussion with the patient reviewing options.  The patient has had opportunities to ask questions and have them answered. The patient and I have decided together through a shared decision making process to consider ICD implant. Risks, benefits, alternatives to ICD implantation were discussed in detail with the patient today. The patient understands that the risks include but are not limited to bleeding, infection, pneumothorax, perforation, tamponade, vascular damage, renal failure, MI, stroke, death, inappropriate shocks, and lead dislodgement and wishes to think about this further.  He is not ready to proceed.  He will contact my office if he decides to proceed.  He is left handed but shoots a gun right handed.  We would therefore plan L sided implant if he proceeds.  He may ultimately be a BAT therapy candidate as well.   Risks, benefits and potential  toxicities for medications prescribed and/or refilled reviewed with patient today.    Follow-up:  as needed He will contact my office if he decides to proceed with an ICD.     Randolm Idol, MD  09/02/2020 9:25 AM     [facility] [address] [place] [address] [phone] (office) [phone] (fax)

## 2020-09-02 NOTE — Patient Instructions (Addendum)
Medication Instructions:  Your physician recommends that you continue on your current medications as directed. Please refer to the Current Medication list given to you today.  Labwork: None ordered.  Testing/Procedures: None ordered.  Follow-Up: Your physician wants you to follow-up in: Call Inetta Fermo, Dr. Jenel Lucks nurse if you wish to proceed with ICD. [phone].  Cardioverter Defibrillator Implantation An implantable cardioverter defibrillator (ICD) is a device that identifies and corrects abnormal heart rhythms. Cardioverter defibrillator implantation is a surgery to place an ICD under the skin in the chest or abdomen. An ICD has a battery, a small computer (pulse generator), and wires (leads) that go into the heart. The ICD detects and corrects two types of dangerous irregular heart rhythms (arrhythmias): A rapid heart rhythm in the lower chambers of the heart (ventricles). This is called ventricular tachycardia. The ventricles contracting in an uncoordinated way. This is called ventricular fibrillation. There are different types of ICDs, and the electrical signals from the ICD can be programmed differently based on the condition being treated. The electrical signals from the ICD can be low-energy pulses, high-energy shocks, or a combination of the two. The low-energy pulses are generally used to restore the heartbeat to normal when it is either too slow (bradycardia) or too fast. These pulses are painless. The high-energy shocks are used to treat abnormal rhythms such as ventricular tachycardia or ventricularfibrillation. This shock may feel like a strong jolt in the chest. Your health care provider may recommend an ICD if you have: Had a ventricular arrhythmia in the past. A damaged heart because of a disease or heart condition. A weakened heart muscle from a heart attack or cardiac arrest. A congenital heart defect. Long QT syndrome, which is a disorder of the heart's electrical  system. Brugada syndrome, which is a condition that causes a disruption of the heart's normal rhythm. Tell a health care provider about: Any allergies you have. All medicines you are taking, including vitamins, herbs, eye drops, creams, and over-the-counter medicines. Any problems you or family members have had with anesthetic medicines. Any blood disorders you have. Any surgeries you have had. Any medical conditions you have. Whether you are pregnant or may be pregnant. What are the risks? Generally, this is a safe procedure. However, problems may occur, including: Infection. Bleeding. Allergic reactions to medicines used during the procedure. Blood clots. Swelling or bruising. Damage to nearby structures or organs, such as nerves, lungs, blood vessels, or the heart where the ICD leads or pulse generator is implanted. What happens before the procedure? Staying hydrated Follow instructions from your health care provider about hydration, which may include: Up to 2 hours before the procedure - you may continue to drink clear liquids, such as water, clear fruit juice, black coffee, and plain tea.  Eating and drinking restrictions Follow instructions from your health care provider about eating and drinking, which may include: 8 hours before the procedure - stop eating heavy meals or foods, such as meat, fried foods, or fatty foods. 6 hours before the procedure - stop eating light meals or foods, such as toast or cereal. 6 hours before the procedure - stop drinking milk or drinks that contain milk. 2 hours before the procedure - stop drinking clear liquids. Medicines Ask your health care provider about: Changing or stopping your regular medicines. This is especially important if you are taking diabetes medicines or blood thinners. Taking medicines such as aspirin and ibuprofen. These medicines can thin your blood. Do not take these medicines unless your  health care provider tells you to  take them. Taking over-the-counter medicines, vitamins, herbs, and supplements. Tests You may have an exam or testing. These may include: Blood tests. A test to check the electrical signals in your heart (electrocardiogram, ECG). Imaging tests, such as a chest X-ray. Echocardiogram. This is an ultrasound of your heart to evaluate your heart structures and function. An event monitor or Holter monitor to wear at home. General instructions Do not use any products that contain nicotine or tobacco for at least 4 weeks before the procedure. These products include cigarettes, chewing tobacco, and vaping devices, such as e-cigarettes. If you need help quitting, ask your health care provider. Ask your health care provider: How your procedure site will be marked. What steps will be taken to help prevent infection. These may include: Removing hair at the surgery site. Washing skin with a germ-killing soap. Taking antibiotic medicine. You may be asked to shower with a germ-killing soap. Plan to have a responsible adult take you home from the hospital or clinic. What happens during the procedure?  Small monitors will be put on your body. They will be used to check your heart rate, blood pressure, and oxygen level. A pair of sticky pads (defibrillator pads) may be placed on your back and chest. These pads are able to pace your heart as needed during the procedure. An IV will be inserted into one of your veins. You will be given one or more of the following: A medicine to help you relax (sedative). A medicine to numb the area (local anesthetic). A medicine to make you fall asleep(general anesthetic). A small incision will be made to create a deep pocket under the skin of your chest or abdomen. Leads will be guided through a blood vessel into your heart and attached to your heart muscles. Depending on the ICD, the leads may go into one ventricle, or they may go into both ventricles and into an upper  chamber of the heart. An X-ray machine (fluoroscope) will be used to help guide the leads. The other end of the leads will be attached to the pulse generator. The pulse generator will be placed into the pocket under the skin. The ICD will be tested, and your health care provider will program the ICD for the condition being treated. The incision will be closed with stitches (sutures), skin glue, adhesive strips, or staples. A bandage (dressing) will be placed over the incision. The procedure may vary among health care providers and hospitals. What happens after the procedure? Your blood pressure, heart rate, breathing rate, and blood oxygen level will be monitored until you leave the hospital or clinic. Your health care provider will also monitor your ICD to make sure it is working properly. A chest X-ray will be taken to check that the ICD is in the right place. Do not raise the arm on the side of your procedure higher than your shoulder for as long as told by your health care provider. This is usually at least 6 weeks. You may be given an identification card explaining that you have an ICD. You will be given a remote home monitoring device to use with your ICD to allow your device to communicate with your clinic. Summary An implantable cardioverter defibrillator (ICD) is a device that identifies and corrects abnormal heart rhythms. Cardioverter defibrillator implantation is a surgery to place an ICD under the skin in the chest or abdomen. An ICD consists of a battery, a small computer (pulse generator),  and wires (leads) that go into the heart. During the procedure, the ICD will be tested, and your health care provider will program the ICD for the condition being treated. After the procedure, a chest X-ray will be taken to check that the ICD is in the right place. This information is not intended to replace advice given to you by your health care provider. Make sure you discuss any questions you  have with your healthcare provider. Document Revised: 07/05/2019 Document Reviewed: 07/05/2019 Elsevier Patient Education  2022 [employer].

## 2020-11-19 ENCOUNTER — Ambulatory Visit: Payer: Medicare Other | Admitting: Cardiology

## 2020-12-04 ENCOUNTER — Encounter: Payer: Self-pay | Admitting: Urology

## 2020-12-04 ENCOUNTER — Ambulatory Visit (INDEPENDENT_AMBULATORY_CARE_PROVIDER_SITE_OTHER): Payer: Medicare Other | Admitting: Urology

## 2020-12-04 ENCOUNTER — Other Ambulatory Visit: Payer: Self-pay

## 2020-12-04 VITALS — BP 129/79 | HR 84

## 2020-12-04 DIAGNOSIS — N138 Other obstructive and reflux uropathy: Secondary | ICD-10-CM

## 2020-12-04 DIAGNOSIS — N401 Enlarged prostate with lower urinary tract symptoms: Secondary | ICD-10-CM | POA: Diagnosis not present

## 2020-12-04 DIAGNOSIS — N5201 Erectile dysfunction due to arterial insufficiency: Secondary | ICD-10-CM | POA: Diagnosis not present

## 2020-12-04 DIAGNOSIS — R339 Retention of urine, unspecified: Secondary | ICD-10-CM | POA: Diagnosis not present

## 2020-12-04 LAB — URINALYSIS, ROUTINE W REFLEX MICROSCOPIC
Bilirubin, UA: NEGATIVE
Ketones, UA: NEGATIVE
Leukocytes,UA: NEGATIVE
Nitrite, UA: NEGATIVE
Protein,UA: NEGATIVE
RBC, UA: NEGATIVE
Specific Gravity, UA: 1.025 (ref 1.005–1.030)
Urobilinogen, Ur: 0.2 mg/dL (ref 0.2–1.0)
pH, UA: 5.5 (ref 5.0–7.5)

## 2020-12-04 LAB — MICROSCOPIC EXAMINATION: RBC, Urine: NONE SEEN /hpf (ref 0–2)

## 2020-12-04 LAB — BLADDER SCAN AMB NON-IMAGING: Scan Result: 21

## 2020-12-04 MED ORDER — MIRABEGRON ER 25 MG PO TB24
25.0000 mg | ORAL_TABLET | Freq: Every day | ORAL | 0 refills | Status: DC
Start: 2020-12-04 — End: 2022-02-04

## 2020-12-04 MED ORDER — TADALAFIL 20 MG PO TABS: 20.0000 mg | ORAL_TABLET | ORAL | 11 refills | Status: DC | PRN

## 2020-12-04 NOTE — Progress Notes (Signed)
post void residual=21  Urological Symptom Review  Patient is experiencing the following symptoms: Frequent urination Hard to postpone urination Get up at night to urinate Leakage of urine Erection problems (male only)   Review of Systems  Gastrointestinal (upper)  : Indigestion/heartburn  Gastrointestinal (lower) : Negative for lower GI symptoms  Constitutional : Fatigue  Skin: Itching  Eyes: Blurred vision Double vision  Ear/Nose/Throat : Sinus problems  Hematologic/Lymphatic: Negative for Hematologic/Lymphatic symptoms  Cardiovascular : Chest pain  Respiratory : Cough Shortness of breath  Endocrine: Excessive thirst  Musculoskeletal: Back pain Joint pain  Neurological: Headaches Dizziness  Psychologic: Depression Anxiety

## 2020-12-04 NOTE — Patient Instructions (Signed)

## 2020-12-04 NOTE — Progress Notes (Signed)
12/04/2020 8:59 AM   [name] [name]. 1965/06/20 [id number]  Referring provider: Rebekah Chesterfield, NP [address] [place],  [place] [address]  Followup BPH and erectile dysfunction   HPI: [name] [name] is a 54yo here for followup for erectile dysfunction and BPH. IPSS 9 QOL 2. Nocturia 3x. Urine stream strong. He has low volume urge incontinence. He has urinary urgency, frequency every 2 hours. He uses tadalafil 20mg  prn which gives him a semifirm erection.    PMH: Past Medical History:  Diagnosis Date   Anxiety    Arthritis    in lower back   Coronary artery disease    a. 02/2008: anterior STEMI s/p DES to prox LAD, b. Myoview 2012: scar in the anterior wall with mild ischemia and EF 33%, c. neg GXT 06/2011  d. 08/2016: cath with DES to diagonal    Depression with anxiety    Diabetes mellitus    TYPE II   Fatty liver    H/O ELEVATED LIVER ENZYMES   High cholesterol    Hyperlipidemia    Hypertension    Ischemic cardiomyopathy    a. Previous EF 33-35% 2012, b. 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)    Myocardial infarction (HCC)    Noncompliance    Prior hx of med noncompliance   OSA (obstructive sleep apnea)    Sleep apnea     Surgical History: Past Surgical History:  Procedure Laterality Date   CARDIAC CATHETERIZATION     CORONARY ANGIOPLASTY     CORONARY STENT INTERVENTION N/A 08/21/2016   Procedure: CORONARY STENT INTERVENTION;  Surgeon: 10/21/2016, MD;  Location: [facility];  Service: Cardiovascular;  Laterality: N/A;   CYSTOSCOPY N/A 12/04/2019   Procedure: CYSTOSCOPY;  Surgeon: 12/06/2019, MD;  Location: [facility];  Service: Urology;  Laterality: N/A;   INTRAVASCULAR PRESSURE WIRE/FFR STUDY N/A 08/21/2016   Procedure: INTRAVASCULAR PRESSURE WIRE/FFR STUDY;  Surgeon: 10/21/2016, MD;  Location: [facility];  Service: Cardiovascular;  Laterality: N/A;   LIVER BIOPSY      MI WITH STENTS     NASAL SEPTOPLASTY W/ TURBINOPLASTY N/A 01/25/2019   Procedure: BILATERAL TURBINATE REDUCTION;  Surgeon: 03/25/2019, MD;  Location: [facility];  Service: ENT;  Laterality: N/A;   NASAL SINUS SURGERY     RIGHT/LEFT HEART CATH AND CORONARY ANGIOGRAPHY N/A 08/21/2016   Procedure: Right/Left Heart Cath and Coronary Angiography;  Surgeon: 10/21/2016, MD;  Location: [facility];  Service: Cardiovascular;  Laterality: N/A;   TRANSURETHRAL RESECTION OF PROSTATE N/A 12/04/2019   Procedure: TRANSURETHRAL RESECTION OF THE PROSTATE (TURP);  Surgeon: 12/06/2019, MD;  Location: [facility];  Service: Urology;  Laterality: N/A;    Home Medications:  Allergies as of 12/04/2020   No Known Allergies      Medication List        Accurate as of December 04, 2020  8:59 AM. If you have any questions, ask your nurse or doctor.          aspirin EC 81 MG tablet Take 81 mg by mouth daily.   BD Pen Needle Nano 2nd Gen 32G X 4 MM Misc Generic drug: Insulin Pen Needle USE AS DIRECTED ONCE DAILY WITH SOLIQUA   carvedilol 25 MG tablet Commonly known as: COREG Take 25 mg by mouth 2 (two) times daily.   escitalopram 20 MG  tablet Commonly known as: LEXAPRO Take 20 mg by mouth daily.   glipiZIDE 10 MG 24 hr tablet Commonly known as: GLUCOTROL XL Take 10 mg by mouth daily.   nitroGLYCERIN 0.4 MG SL tablet Commonly known as: NITROSTAT Place 0.4 mg under the tongue every 5 (five) minutes as needed for chest pain.   oxyCODONE-acetaminophen 5-325 MG tablet Commonly known as: Percocet Take 1 tablet by mouth every 4 (four) hours as needed for severe pain.   Rybelsus 3 MG Tabs Generic drug: Semaglutide Take 3 mg by mouth daily.   Rybelsus 7 MG Tabs Generic drug: Semaglutide Take 1 tablet by mouth every morning.   sacubitril-valsartan 97-103 MG Commonly known as: ENTRESTO Take 1 tablet by mouth 2 (two) times daily.   simvastatin 40 MG tablet Commonly known as:  ZOCOR Take 1 tablet (40 mg total) by mouth at bedtime.   spironolactone 25 MG tablet Commonly known as: ALDACTONE Take 1 tablet (25 mg total) by mouth daily.   tadalafil 20 MG tablet Commonly known as: CIALIS Take 1 tablet (20 mg total) by mouth daily as needed.   Toujeo Max SoloStar 300 UNIT/ML Solostar Pen Generic drug: insulin glargine (2 Unit Dial) SMARTSIG:40 Unit(s) SUB-Q Daily        Allergies: No Known Allergies  Family History: Family History  Problem Relation Age of Onset   Coronary artery disease Other        UNKNOWN   Heart disease Father        CAD s/p CABG   Diabetes Mellitus II Father    Diabetes Mellitus II Mother    Cancer Brother     Social History:  reports that he has never smoked. He has never used smokeless tobacco. He reports that he does not drink alcohol and does not use drugs.  ROS: All other review of systems were reviewed and are negative except what is noted above in HPI  Physical Exam: BP 129/79   Pulse 84   Constitutional:  Alert and oriented, No acute distress. HEENT: [place] AT, moist mucus membranes.  Trachea midline, no masses. Cardiovascular: No clubbing, cyanosis, or edema. Respiratory: Normal respiratory effort, no increased work of breathing. GI: Abdomen is soft, nontender, nondistended, no abdominal masses GU: No CVA tenderness.  Lymph: No cervical or inguinal lymphadenopathy. Skin: No rashes, bruises or suspicious lesions. Neurologic: Grossly intact, no focal deficits, moving all 4 extremities. Psychiatric: Normal mood and affect.  Laboratory Data: Lab Results  Component Value Date   WBC 10.2 12/05/2019   HGB 14.2 12/05/2019   HCT 44.4 12/05/2019   MCV 91.5 12/05/2019   PLT 247 12/05/2019    Lab Results  Component Value Date   CREATININE 0.72 12/05/2019    Lab Results  Component Value Date   PSA 0.5 07/12/2019    No results found for: TESTOSTERONE  Lab Results  Component Value Date   HGBA1C 7.4 (H)  12/04/2019    Urinalysis    Component Value Date/Time   COLORURINE AMBER (A) 05/19/2019 1600   APPEARANCEUR Clear 05/31/2020 0937   LABSPEC 1.031 (H) 05/19/2019 1600   PHURINE 6.0 05/19/2019 1600   GLUCOSEU Negative 05/31/2020 0937   HGBUR LARGE (A) 05/19/2019 1600   BILIRUBINUR Negative 05/31/2020 0937   KETONESUR 20 (A) 05/19/2019 1600   PROTEINUR Negative 05/31/2020 0937   PROTEINUR >=300 (A) 05/19/2019 1600   UROBILINOGEN 0.2 09/29/2019 0910   NITRITE Negative 05/31/2020 0937   NITRITE NEGATIVE 05/19/2019 1600   LEUKOCYTESUR Negative 05/31/2020 2458  LEUKOCYTESUR NEGATIVE 05/19/2019 1600    Lab Results  Component Value Date   LABMICR Comment 05/31/2020   WBCUA 0-5 03/15/2020   LABEPIT 0-10 03/15/2020   BACTERIA None seen 03/15/2020    Pertinent Imaging:  No results found for this or any previous visit.  No results found for this or any previous visit.  No results found for this or any previous visit.  No results found for this or any previous visit.  No results found for this or any previous visit.  No results found for this or any previous visit.  No results found for this or any previous visit.  No results found for this or any previous visit.   Assessment & Plan:    1. Incomplete bladder emptying -resolved - Urinalysis, Routine w reflex microscopic - BLADDER SCAN AMB NON-IMAGING  2. Benign prostatic hyperplasia with urinary obstruction, urinary frequency - we will trial mirabegron 25mg  daily  3. Erectile dysfunction due to arterial insufficiency -Tadalafil 20mg  prn   No follow-ups on file.  , MD  [facility] [place]

## 2020-12-05 ENCOUNTER — Ambulatory Visit (INDEPENDENT_AMBULATORY_CARE_PROVIDER_SITE_OTHER): Payer: Medicare Other | Admitting: Cardiology

## 2020-12-05 ENCOUNTER — Encounter: Payer: Self-pay | Admitting: *Deleted

## 2020-12-05 ENCOUNTER — Encounter: Payer: Self-pay | Admitting: Cardiology

## 2020-12-05 VITALS — BP 124/80 | HR 82 | Ht 70.0 in | Wt 255.2 lb

## 2020-12-05 DIAGNOSIS — I5022 Chronic systolic (congestive) heart failure: Secondary | ICD-10-CM

## 2020-12-05 DIAGNOSIS — G4733 Obstructive sleep apnea (adult) (pediatric): Secondary | ICD-10-CM

## 2020-12-05 DIAGNOSIS — E782 Mixed hyperlipidemia: Secondary | ICD-10-CM

## 2020-12-05 DIAGNOSIS — I2510 Atherosclerotic heart disease of native coronary artery without angina pectoris: Secondary | ICD-10-CM | POA: Diagnosis not present

## 2020-12-05 MED ORDER — ATORVASTATIN CALCIUM 80 MG PO TABS: 80.0000 mg | ORAL_TABLET | Freq: Every day | ORAL | 6 refills | Status: DC

## 2020-12-05 NOTE — Progress Notes (Signed)
Clinical Summary [name] [name] is a 55 y.o.male seen today for follow up of the following medical problems.    1. CAD/ICM/Chronic systolic HF - history of anterior STEMI 02/2008, received DES to LAD. LVEF at that time was 35% - 2012 myoview anterior scar with mild ischemia - 06/2011 echo LVEF 45-50% - 06/2011 GXT no ischemia  - 07/2016 echo shows LVEF down to 30-35%, diffuse hypokinesis with akinesis of anerior and anteroseptal walls. Evidence of LV thrombus - with drop in LVEF and ongoing chest pain was referred for cath  08/2016 cath: see report below, received DES to D2 99%. Ok to stop ASA once coumadin is therapeutic per interventional cardiology, continue plavix.  11/2016 echo LVEF 35%, apical thrombus - 02/2017 echo LVEF 35-40%, restrictive, no clear thrombus    Jan 2020 nuclear stress: large anterior/anteroseptal scar, LVEF 22%.       - 06/2020 echo LVEF 25% - was on jardiance, stopped causing incontinence related to prior urology procedure.        - has seen EP and discussed ICD, patient asked to give ICD some thought. Has not made up mind - SOB is up and down. No recent edema.  - compliant with meds - no recent chest pains.     2. History of prior LV thrombus - noted on 11/2016 echo -repeat echo 02/2017 showed resolution of clot, coumadin was stopped.  -06/2020 echo no thrombus     3. HTN - he is compliant with meds     4. OSA - has prior diagnosis. Not wearing cpap due to discomfort.  5.Hyperlipidemia 06/2020 TC 208 TG 116 HDL 38 LDL 149 - he has been on simvastatin 40mg  daily.    Past Medical History:  Diagnosis Date   Anxiety    Arthritis    in lower back   Coronary artery disease    a. 02/2008: anterior STEMI s/p DES to prox LAD, b. Myoview 2012: scar in the anterior wall with mild ischemia and EF 33%, c. neg GXT 06/2011  d. 08/2016: cath with DES to diagonal    Depression with anxiety    Diabetes mellitus    TYPE II   Fatty liver    H/O ELEVATED LIVER  ENZYMES   High cholesterol    Hyperlipidemia    Hypertension    Ischemic cardiomyopathy    a. Previous EF 33-35% 2012, b. 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)    Myocardial infarction (HCC)    Noncompliance    Prior hx of med noncompliance   OSA (obstructive sleep apnea)    Sleep apnea      No Known Allergies   Current Outpatient Medications  Medication Sig Dispense Refill   aspirin EC 81 MG tablet Take 81 mg by mouth daily.     BD PEN NEEDLE NANO 2ND GEN 32G X 4 MM MISC USE AS DIRECTED ONCE DAILY WITH SOLIQUA     carvedilol (COREG) 25 MG tablet Take 25 mg by mouth 2 (two) times daily.     escitalopram (LEXAPRO) 20 MG tablet Take 20 mg by mouth daily.     glipiZIDE (GLUCOTROL XL) 10 MG 24 hr tablet Take 10 mg by mouth daily.     mirabegron ER (MYRBETRIQ) 25 MG TB24 tablet Take 1 tablet (25 mg total) by mouth daily. 30 tablet 0   nitroGLYCERIN (NITROSTAT) 0.4 MG SL tablet Place 0.4 mg under the tongue every  5 (five) minutes as needed for chest pain.     RYBELSUS 7 MG TABS Take 1 tablet by mouth every morning.     sacubitril-valsartan (ENTRESTO) 97-103 MG Take 1 tablet by mouth 2 (two) times daily. 60 tablet 6   Semaglutide (RYBELSUS) 3 MG TABS Take 3 mg by mouth daily.     simvastatin (ZOCOR) 40 MG tablet Take 1 tablet (40 mg total) by mouth at bedtime. 90 tablet 1   spironolactone (ALDACTONE) 25 MG tablet Take 1 tablet (25 mg total) by mouth daily. 90 tablet 3   tadalafil (CIALIS) 20 MG tablet Take 1 tablet (20 mg total) by mouth as needed. 10 tablet 11   TOUJEO MAX SOLOSTAR 300 UNIT/ML Solostar Pen SMARTSIG:40 Unit(s) SUB-Q Daily     No current facility-administered medications for this visit.     Past Surgical History:  Procedure Laterality Date   CARDIAC CATHETERIZATION     CORONARY ANGIOPLASTY     CORONARY STENT INTERVENTION N/A 08/21/2016   Procedure: CORONARY STENT INTERVENTION;  Surgeon: Kathleene Hazel, MD;   [facility]: [facility];  Service: Cardiovascular;  Laterality: N/A;   CYSTOSCOPY N/A 12/04/2019   Procedure: CYSTOSCOPY;  Surgeon: Malen Gauze, MD;  Location: [facility];  Service: Urology;  Laterality: N/A;   INTRAVASCULAR PRESSURE WIRE/FFR STUDY N/A 08/21/2016   Procedure: INTRAVASCULAR PRESSURE WIRE/FFR STUDY;  Surgeon: Kathleene Hazel, MD;  Location: [facility];  Service: Cardiovascular;  Laterality: N/A;   LIVER BIOPSY     MI WITH STENTS     NASAL SEPTOPLASTY W/ TURBINOPLASTY N/A 01/25/2019   Procedure: BILATERAL TURBINATE REDUCTION;  Surgeon: Newman Pies, MD;  Location: [facility];  Service: ENT;  Laterality: N/A;   NASAL SINUS SURGERY     RIGHT/LEFT HEART CATH AND CORONARY ANGIOGRAPHY N/A 08/21/2016   Procedure: Right/Left Heart Cath and Coronary Angiography;  Surgeon: Kathleene Hazel, MD;  Location: [facility];  Service: Cardiovascular;  Laterality: N/A;   TRANSURETHRAL RESECTION OF PROSTATE N/A 12/04/2019   Procedure: TRANSURETHRAL RESECTION OF THE PROSTATE (TURP);  Surgeon: Malen Gauze, MD;  Location: [facility];  Service: Urology;  Laterality: N/A;     No Known Allergies    Family History  Problem Relation Age of Onset   Coronary artery disease Other        UNKNOWN   Heart disease Father        CAD s/p CABG   Diabetes Mellitus II Father    Diabetes Mellitus II Mother    Cancer Brother      Social History Mr. Hoctor reports that he has never smoked. He has never used smokeless tobacco. Mr. Pett reports no history of alcohol use.   Review of Systems CONSTITUTIONAL: No weight loss, fever, chills, weakness or fatigue.  HEENT: Eyes: No visual loss, blurred vision, double vision or yellow sclerae.No hearing loss, sneezing, congestion, runny nose or sore throat.  SKIN: No rash or itching.  CARDIOVASCULAR: per hpi RESPIRATORY: No shortness of breath, cough or sputum.  GASTROINTESTINAL: No anorexia, nausea, vomiting or diarrhea. No abdominal  pain or blood.  GENITOURINARY: No burning on urination, no polyuria NEUROLOGICAL: No headache, dizziness, syncope, paralysis, ataxia, numbness or tingling in the extremities. No change in bowel or bladder control.  MUSCULOSKELETAL: No muscle, back pain, joint pain or stiffness.  LYMPHATICS: No enlarged nodes. No history of splenectomy.  PSYCHIATRIC: No history of depression or anxiety.  ENDOCRINOLOGIC: No reports of sweating, cold or heat intolerance.  No polyuria or polydipsia.  Marland Kitchen   Physical Examination Today's Vitals   12/05/20 1130  BP: 124/80  Pulse: 82  SpO2: 93%  Weight: 255 lb 3.2 oz (115.8 kg)  Height: 5\' 10"  (1.778 m)   Body mass index is 36.62 kg/m.  Gen: resting comfortably, no acute distress HEENT: no scleral icterus, pupils equal round and reactive, no palptable cervical adenopathy,  CV: RRR, no m/rg, no jvd Resp: Clear to auscultation bilaterally GI: abdomen is soft, non-tender, non-distended, normal bowel sounds, no hepatosplenomegaly MSK: extremities are warm, no edema.  Skin: warm, no rash Neuro:  no focal deficits Psych: appropriate affect   Diagnostic Studies  6./2013 echo Study Conclusions  - Left ventricle: Systolic function was mildly reduced. The   estimated ejection fraction was in the range of 45% to   50%. - Left atrium: The atrium was mildly dilated.   08/07/16 echo Study Conclusions   - Left ventricle: LV systolic function is depressed at   approximately 30 to 35% with severe hypokinesis/akinesis of the   inferior, septal, distal lateral and apical walls. Cannot exclude   mural thrombus at apex. Suggest limited echo with Definity to   define The cavity size was normal. Wall thickness was normal. - Mitral valve: There was mild regurgitation. - Left atrium: The atrium was mildly dilated.   08/14/16 echo Study Conclusions   - Procedure narrative: Transthoracic echocardiography. Image   quality was adequate. The study was technically  difficult.   Intravenous contrast (Definity) was administered. - Left ventricle: Systolic function was moderately to severely   reduced. The estimated ejection fraction was in the range of 30%   to 35%. Diffuse hypokinesis. There is akinesis of the   anteroseptal, anterior, and apical myocardium. - Right ventricle: Systolic function was normal.   Impressions:   - Limited study with Definity contrast. LVEF is in the range of   30-35%. There is a loosely organized LV apical thrombus noted.   Diffuse hypokinesis overall with akinesis of the anterior,   anteroseptal, and apical myocardium. RV contraction normal based   on limited views.   08/2016 cath Mid RCA-2 lesion, 30 %stenosed. Mid RCA-1 lesion, 30 %stenosed. Ost Ramus to Ramus lesion, 20 %stenosed. Ost LAD to Prox LAD lesion, 10 %stenosed. Prox LAD to Mid LAD lesion, 60 %stenosed. A STENT SYNERGY DES 2.25X12 drug eluting stent was successfully placed. 2nd Diag lesion, 99 %stenosed. Post intervention, there is a 0% residual stenosis.   1. Patent stent proximal LAD 2. Moderate stenosis mid LAD. FFR was 0.84 suggesting the stenosis was not flow limiting. 3. The Diagonal Synda Bagent is a moderate caliber vessel with a 99% stenosis. 4. Successful PTCA/DES x 1 Diagonal Kaci Freel 5. Mild disease RCA   Recommendations: Will continue ASA and Plavix for now. He will need to be started on coumadin tomorrow before discharge for his LV thrombus. He can stop ASA when his INR is therapeutic on coumadin since he was not an ACS. I would plan long term coumadin for LV thrombus and Plavix for at least one year given placement of DES. Continue statin and beta blocker.    02/2017 echo Study Conclusions   - Left ventricle: The cavity size was normal. Wall thickness was   increased in a pattern of mild LVH. Systolic function was   moderately reduced. The estimated ejection fraction was in the   range of 35% to 40%. No definitive thrombus seen with  contrast   enhancement. Diffuse hypokinesis. Diastolic dysfunction  with   restrictive physiology. - Regional wall motion abnormality: Akinesis of the mid anterior   and basal-mid anteroseptal myocardium. - Right ventricle: Systolic function was reduced.     Jan 2020 nuclear stress No diagnostic ST segment changes to indicate ischemia. Large, dense, anterior and anteroseptal defect extending from apex to base consistent with infarct scar. This is a high risk study. Nuclear stress EF: 22%. Consistent with ischemic cardiomyopathy and large anterior/anteroseptal infarct scar, no significant ischemia.     Assessment and Plan   1. CAD/ICM/chronic systolic HF -  Fatigue on higher coreg dosing. Prior issues with orthostatic symptoms had limited medical therapy but these have resolved and have made recent titrations.  - of note LVEF dropped to 35% in 2018. Had cath at time time with PCI as reported above. LVEF has not improved since that time and has further declined. Jan 2020 nuclear stress with just scar, no ischemia.   - did not tolerate jardiance due to urinary symptoms - has not wished to proceed with ICD as of yet  - continue current meds, no recent symptoms   2. OSA - not established with provider, issues tolerating CPAP. Will refer to pulmonary   3. Hyperlipidemia - not at goal, change simva to atorva 80mg  daily  F/u 6 months         , M.D

## 2020-12-05 NOTE — Patient Instructions (Addendum)
Medication Instructions:  Stop Simvastatin (Zocor) Begin Atorvastatin 80mg  daily  Continue all other medications.     Labwork: none  Testing/Procedures: none  Follow-Up: Your physician wants you to follow up in: 6 months.  You will receive a reminder letter in the mail one-two months in advance.  If you don't receive a letter, please call our office to schedule the follow up appointment   Any Other Special Instructions Will Be Listed Below (If Applicable). You have been referred to:  Pulmonology   If you need a refill on your cardiac medications before your next appointment, please call your pharmacy.

## 2020-12-15 ENCOUNTER — Other Ambulatory Visit: Payer: Self-pay | Admitting: Family Medicine

## 2020-12-31 ENCOUNTER — Other Ambulatory Visit: Payer: Self-pay

## 2020-12-31 ENCOUNTER — Encounter: Payer: Self-pay | Admitting: Pulmonary Disease

## 2020-12-31 ENCOUNTER — Ambulatory Visit (INDEPENDENT_AMBULATORY_CARE_PROVIDER_SITE_OTHER): Payer: Medicare Other | Admitting: Pulmonary Disease

## 2020-12-31 DIAGNOSIS — G4733 Obstructive sleep apnea (adult) (pediatric): Secondary | ICD-10-CM | POA: Diagnosis not present

## 2020-12-31 DIAGNOSIS — I10 Essential (primary) hypertension: Secondary | ICD-10-CM | POA: Diagnosis not present

## 2020-12-31 NOTE — Assessment & Plan Note (Addendum)
Blood pressure is high today but he denies headaches or blurred vision. We will recheck and he will take his medications Reassess with PCP  We discussed implications of untreated OSA on his poor cardiovascular profile

## 2020-12-31 NOTE — Progress Notes (Signed)
Subjective:    Patient ID: [name] Gell., male    DOB: 1965/04/11, 55 y.o.   MRN: [id number]  HPI  55 year old man presents to establish care for OSA  PMH - history of anterior STEMI 02/2008, received DES to LAD & D2 in 2018  LVEF  25- 35%, not ready for ICD -Diabetes type 2 for 20 years, on insulin for 1 year  Last cardiology consultation was reviewed.  He reports excessive daytime somnolence and nonrefreshing sleep with tossing and turning and several nocturnal awakenings throughout the night. Epworth sleepiness score is 22.  Sleep study was reviewed. He reports bedtime around 11 PM sleep latency can be 30 minutes, he sleeps on his side with 1 pillow, reports 5-6 nocturnal awakenings and wakes up around 8 AM but within a few minutes he lays in bed and falls asleep again only to get out of bed around noon time with occasional headaches and dryness of mouth. 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  He tried to go back to work in 2015 but had another stent in 2018 and has not worked since  Past Medical History:  Diagnosis Date   Anxiety    Arthritis    in lower back   Coronary artery disease    a. 02/2008: anterior STEMI s/p DES to prox LAD, b. Myoview 2012: scar in the anterior wall with mild ischemia and EF 33%, c. neg GXT 06/2011  d. 08/2016: cath with DES to diagonal    Depression with anxiety    Diabetes mellitus    TYPE II   Fatty liver    H/O ELEVATED LIVER ENZYMES   High cholesterol     Hyperlipidemia    Hypertension    Ischemic cardiomyopathy    a. Previous EF 33-35% 2012, b. 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 ([facility])    Myocardial infarction ([facility])    Noncompliance    Prior hx of med noncompliance   OSA (obstructive sleep apnea)    Sleep apnea    Past Surgical History:  Procedure Laterality Date   CARDIAC CATHETERIZATION     CORONARY ANGIOPLASTY     CORONARY STENT INTERVENTION N/A 08/21/2016   Procedure: CORONARY STENT INTERVENTION;  Surgeon: Burnell Blanks, MD;  Location: [facility];  Service: Cardiovascular;  Laterality: N/A;   CYSTOSCOPY N/A 12/04/2019   Procedure: CYSTOSCOPY;  Surgeon: Cleon Gustin, MD;  Location: [facility];  Service: Urology;  Laterality: N/A;   INTRAVASCULAR PRESSURE WIRE/FFR STUDY N/A 08/21/2016   Procedure: INTRAVASCULAR PRESSURE WIRE/FFR STUDY;  Surgeon: Burnell Blanks, MD;  Location: [facility];  Service: Cardiovascular;  Laterality: N/A;   LIVER BIOPSY     MI WITH STENTS     NASAL SEPTOPLASTY W/ TURBINOPLASTY N/A 01/25/2019   Procedure: BILATERAL TURBINATE REDUCTION;  Surgeon: Leta Baptist, MD;  Location: [facility];  Service: ENT;  Laterality: N/A;  NASAL SINUS SURGERY     RIGHT/LEFT HEART CATH AND CORONARY ANGIOGRAPHY N/A 08/21/2016   Procedure: Right/Left Heart Cath and Coronary Angiography;  Surgeon: Kathleene Hazel, MD;  [facility];  Service: Cardiovascular;  Laterality: N/A;   TRANSURETHRAL RESECTION OF PROSTATE N/A 12/04/2019   Procedure: TRANSURETHRAL RESECTION OF THE PROSTATE (TURP);  Surgeon: Malen Gauze, MD;  Location: [facility];  Service: Urology;  Laterality: N/A;    No Known Allergies  Social History   Socioeconomic History   Marital status: Legally Separated    Spouse name: Not on file   Number of children: Not on file   Years of education: Not on file   Highest education level: Not on file  Occupational History    Not on file  Tobacco Use   Smoking status: Never   Smokeless tobacco: Never  Vaping Use   Vaping Use: Never used  Substance and Sexual Activity   Alcohol use: No   Drug use: No   Sexual activity: Not on file  Other Topics Concern   Not on file  Social History Narrative   Not on file   Social Determinants of Health   Financial Resource Strain: Not on file  Food Insecurity: Not on file  Transportation Needs: Not on file  Physical Activity: Not on file  Stress: Not on file  Social Connections: Not on file  Intimate Partner Violence: Not on file     Family History  Problem Relation Age of Onset   Coronary artery disease Other        UNKNOWN   Heart disease Father        CAD s/p CABG   Diabetes Mellitus II Father    Diabetes Mellitus II Mother    Cancer Brother       Review of Systems Shortness of breath with activity Nonproductive cough Headaches Tooth problems Itching, Anxiety and depression Joint stiffness  Constitutional: negative for anorexia, fevers and sweats  Eyes: negative for irritation, redness and visual disturbance  Ears, nose, mouth, throat, and face: negative for earaches, epistaxis, nasal congestion and sore throat  Respiratory: negative for sputum and wheezing  Cardiovascular: negative for chest pain, dyspnea, lower extremity edema, orthopnea, palpitations and syncope  Gastrointestinal: negative for abdominal pain, constipation, diarrhea, melena, nausea and vomiting  Genitourinary:negative for dysuria, frequency and hematuria  Hematologic/lymphatic: negative for bleeding, easy bruising and lymphadenopathy  Musculoskeletal:negative for arthralgias, muscle weakness  Neurological: negative for coordination problems, gait problems, headaches and weakness  Endocrine: negative for diabetic symptoms including polydipsia, polyuria and weight loss     Objective:   Physical Exam  Gen. Pleasant, obese, in no distress, normal affect ENT - no  pallor,icterus, no post nasal drip, class 2-3 airway Neck: No JVD, no thyromegaly, no carotid bruits Lungs: no use of accessory muscles, no dullness to percussion, decreased without rales or rhonchi  Cardiovascular: Rhythm regular, heart sounds  normal, no murmurs or gallops, no peripheral edema Abdomen: soft and non-tender, no hepatosplenomegaly, BS normal. Musculoskeletal: No deformities, no cyanosis or clubbing Neuro:  alert, non focal, no tremors       Assessment & Plan:

## 2020-12-31 NOTE — Addendum Note (Signed)
Addended by: Arlyss Repress on: 12/31/2020 10:37 AM   Modules accepted: Orders

## 2020-12-31 NOTE — Assessment & Plan Note (Signed)
I reviewed sleep study which shows at least moderate OSA without supine sleep.  I suspect that actual degree of OSA is much worse especially with supine sleep given his history of severe hypersomnolence.  He is not tolerating the CPAP and I wonder if this is because of too low pressure due to the ramp function or auto settings when it starts of low.  He may actually do better with the CPAP starting at higher pressure.  We will ask DME to discontinue the ramp feature Due to his problems we will also schedule formal titration study.  I feel confident that we should be able to get him comfortable on the machine  Weight loss encouraged, compliance with goal of at least 4-6 hrs every night is the expectation. Advised against medications with sedative side effects Cautioned against driving when sleepy - understanding that sleepiness will vary on a day to day basis

## 2020-12-31 NOTE — Progress Notes (Deleted)
DME: [facility]  [facility] 12/31/2020

## 2020-12-31 NOTE — Patient Instructions (Signed)
X CPAP titration study  Based on this, we will adjust your machine. Ask DME to dc ramp feature

## 2021-02-03 ENCOUNTER — Ambulatory Visit: Payer: Medicare Other | Attending: Pulmonary Disease | Admitting: Pulmonary Disease

## 2021-02-03 ENCOUNTER — Other Ambulatory Visit: Payer: Self-pay

## 2021-02-03 DIAGNOSIS — G4733 Obstructive sleep apnea (adult) (pediatric): Secondary | ICD-10-CM | POA: Diagnosis present

## 2021-02-04 ENCOUNTER — Telehealth: Payer: Self-pay | Admitting: Pulmonary Disease

## 2021-02-04 NOTE — Telephone Encounter (Signed)
Based on ttirations tudy , please ask DME to cahnge autoCPAP to 8-15 cm  OV in 1 month after using

## 2021-02-04 NOTE — Telephone Encounter (Signed)
Called and spoke to patient. States he is unsure of setting change because last night during titration study the pressures still did not feel right. Wanted to let Dr. Vassie Loll know that he is willing to try to use cpap machine again but every time he uses it feels like he is choking.   Dr. Vassie Loll please advise

## 2021-02-04 NOTE — Procedures (Signed)
Patient Name: Halim, Cypret Date: 02/03/2021 Gender: Male D.O.B: Oct 24, 1965 Age (years): 73 Referring Provider: Kara Mead MD, ABSM Height (inches): 70 Interpreting Physician: Kara Mead MD, ABSM Weight (lbs): 234 RPSGT: Rosebud Poles BMI: 34 MRN: [id number] Neck Size: 20.00 <br> <br> CLINICAL INFORMATION The patient is referred for a CPAP titration to treat sleep apnea.    Date of NPSG 08/2018 - wt 234 lbs -TST 337 minutes, RDI 17/hour, AHI 15.5/hour, no supine sleep, lowest desaturation 83%, 3 REM periods  SLEEP STUDY TECHNIQUE As per the AASM Manual for the Scoring of Sleep and Associated Events v2.3 (April 2016) with a hypopnea requiring 4% desaturations.  The channels recorded and monitored were frontal, central and occipital EEG, electrooculogram (EOG), submentalis EMG (chin), nasal and oral airflow, thoracic and abdominal wall motion, anterior tibialis EMG, snore microphone, electrocardiogram, and pulse oximetry. Continuous positive airway pressure (CPAP) was initiated at the beginning of the study and titrated to treat sleep-disordered breathing.  MEDICATIONS Medications self-administered by patient taken the night of the study : N/A  TECHNICIAN COMMENTS Comments added by technician: CPAP therapy started at 4 cm of H20. Titration increased to 10 cm of H20 due to events throughout study. Suboptimal pressure obtained due to REM- supine stage was not observed, although good control of events at 10 cm of H2O in lateral positons. PLMS noticed at times during study. Patient states, he rarely sleeps in supine position. Patient requested to end study early due to discomfort with equipment. Patient felt tired and sleepy upon awakening. He stated that he had not been asleep all night Comments added by scorer: N/A RESPIRATORY PARAMETERS Optimal PAP Pressure (cm):  AHI at Optimal Pressure (/hr): N/A Overall Minimal O2 (%): 83.00 Supine % at Optimal Pressure (%): N/A Minimal O2 at  Optimal Pressure (%): 83.00   SLEEP ARCHITECTURE The study was initiated at 10:43:30 PM and ended at 6:42:18 AM.  Sleep onset time was 5.7 minutes and the sleep efficiency was 85.7%. The total sleep time was 410.5 minutes.  The patient spent 5.60% of the night in stage N1 sleep, 54.32% in stage N2 sleep, 27.65% in stage N3 and 12.4% in REM.Stage REM latency was 246.0 minutes  Wake after sleep onset was 62.6. Alpha intrusion was absent. Supine sleep was 0.73%.  CARDIAC DATA The 2 lead EKG demonstrated sinus rhythm. The mean heart rate was 88.41 beats per minute. Other EKG findings include: PVCs. LEG MOVEMENT DATA The total Periodic Limb Movements of Sleep (PLMS) were 346. The PLMS index was 50.57. A PLMS index of <15 is considered normal in adults.  IMPRESSIONS - An optimal PAP pressure could not be selected for this patient based on the available study data. - Central sleep apnea was noted during this titration (CAI = 4.8/h). - Moderate oxygen desaturations were observed during this titration (min O2 = 83.00%). - The patient snored with soft snoring volume during this titration study. - 2-lead EKG demonstrated: PVCs - Severe periodic limb movements were observed during this study. Arousals associated with PLMs were rare.   DIAGNOSIS - Obstructive Sleep Apnea (G47.33) - Treatment emergent central sleep apnea   RECOMMENDATIONS - Trial of CPAP 10 cm with medium full face mask . Alternatively autoCPAP 5-15 can be tried .If central apneas persist after a 3 month trial, consider alternative modes including BiPAP or ASV - Avoid alcohol, sedatives and other CNS depressants that may worsen sleep apnea and disrupt normal sleep architecture. - Sleep hygiene should be reviewed to assess factors that  may improve sleep quality. - Weight management and regular exercise should be initiated or continued. - Return to [facility] Center for re-evaluation after 4 weeks of therapy  .mys

## 2021-02-05 ENCOUNTER — Other Ambulatory Visit: Payer: Self-pay

## 2021-02-05 DIAGNOSIS — G4733 Obstructive sleep apnea (adult) (pediatric): Secondary | ICD-10-CM

## 2021-02-05 NOTE — Telephone Encounter (Signed)
Patient returned call and states he will try again with cpap therapy and will call us if he continues to have isues.

## 2021-02-10 ENCOUNTER — Telehealth: Payer: Self-pay | Admitting: Pulmonary Disease

## 2021-02-10 NOTE — Telephone Encounter (Signed)
Patient called and states he is still having bad headaches when waking up and feeling tired throughout the day after wearing CPAP. States he believes his DME has already changed his pressures (ordered on 02/05/21). States he feels his cpap  is better but still not right.   Dr. Vassie Loll please advise

## 2021-02-11 NOTE — Telephone Encounter (Signed)
Download obtained from 02/06/2021-02/09/2021 (only data available for pt). Will fax DL to [place] office for r. Alva to review when hes back in office tomorrow. Will route to Dr. Elsworth Soho as an Juluis Rainier

## 2021-02-12 ENCOUNTER — Other Ambulatory Visit: Payer: Self-pay

## 2021-02-12 DIAGNOSIS — G4733 Obstructive sleep apnea (adult) (pediatric): Secondary | ICD-10-CM

## 2021-02-12 NOTE — Telephone Encounter (Signed)
Called and spoke to patient. He voiced understanding of settings change. Order placed for DME  to change settings

## 2021-02-17 ENCOUNTER — Telehealth: Payer: Self-pay | Admitting: Pulmonary Disease

## 2021-02-17 NOTE — Telephone Encounter (Signed)
Called and spoke with patient. He stated that RA wanted to change his cpap settings last week. Per patient, his cpap is still staying at 5cm all night without ever changing. I was able to review his prescription on [facility] and it looks like the pressure was changed to 5-18cm on 01/28.   Patient is aware that [facility]'s DME department is closed at this time and we will call back tomorrow morning.

## 2021-02-19 NOTE — Telephone Encounter (Signed)
Called and spoke with Burna Mortimer at [facility] to see if the settings on patient's machine have been changed. She confirmed that they were changed on 02/13/21 to 5-18 cm. ATC patient to let him know, Per dpr left detailed message letting him know and also provided number to [facility] if he needed anything further. Nothing further needed at this time.

## 2021-03-31 ENCOUNTER — Ambulatory Visit: Payer: Medicare Other | Admitting: Pulmonary Disease

## 2021-04-16 ENCOUNTER — Ambulatory Visit (INDEPENDENT_AMBULATORY_CARE_PROVIDER_SITE_OTHER): Payer: Medicare Other | Admitting: Pulmonary Disease

## 2021-04-16 ENCOUNTER — Encounter: Payer: Self-pay | Admitting: Pulmonary Disease

## 2021-04-16 ENCOUNTER — Other Ambulatory Visit: Payer: Self-pay

## 2021-04-16 DIAGNOSIS — G4733 Obstructive sleep apnea (adult) (pediatric): Secondary | ICD-10-CM

## 2021-04-16 DIAGNOSIS — I255 Ischemic cardiomyopathy: Secondary | ICD-10-CM

## 2021-04-16 NOTE — Progress Notes (Signed)
? ?  Subjective:  ? ? Patient ID: [name] [name]., male    DOB: 1966-01-10, 56 y.o.   MRN: [id number] ? ?HPI ? ?55-yo with ischemic cardiomyopathy for  FU of OSA ?  ?PMH - AW- STEMI 02/2008, received DES to LAD & D2 in 2018  LVEF  25- 35%, not ready for ICD ?-Diabetes type 2 for 20 years, on insulin for 1 year ? ?Last office visit in December 2022.  We reviewed CPAP titration study. ?He was on auto CPAP and complained of pressure not being high enough so on his last visit we changed him to auto settings 8 to 15 cm and discontinue ramp feature. ?He is still struggling with his machine and complains of pressure not being high I note that he is on auto settings 5 to 18 cm ?Average pressure is 14 cm on download with maximum pressure of 15 cm and residual AHI of 10/hour with centrals 2/hour ?Compliance has dropped ? ?He still complains of headaches but denies significant sleepiness ? ?Significant tests/ events reviewed ? ?NPSG 08/2018 - wt 234 lbs -TST 337 minutes, RDI 17/hour, AHI 15.5/hour, no supine sleep, lowest desaturation 83%, 3 REM periods ? ?CPAP titration 01/2021 >> sub opimal ? 10 cm, treatment emergent centrals +  PLMs+ ? ? ?Review of Systems ?neg for any significant sore throat, dysphagia, itching, sneezing, nasal congestion or excess/ purulent secretions, fever, chills, sweats, unintended wt loss, pleuritic or exertional cp, hempoptysis, orthopnea pnd or change in chronic leg swelling. Also denies presyncope, palpitations, heartburn, abdominal pain, nausea, vomiting, diarrhea or change in bowel or urinary habits, dysuria,hematuria, rash, arthralgias, visual complaints, headache, numbness weakness or ataxia. ? ?   ?Objective:  ? Physical Exam ? ?Gen. Pleasant, obese, in no distress ?ENT - no lesions, no post nasal drip ?Neck: No JVD, no thyromegaly, no carotid bruits ?Lungs: no use of accessory muscles, no dullness to percussion, decreased without rales or rhonchi  ?Cardiovascular: Rhythm regular, heart sounds   normal, no murmurs or gallops, no peripheral edema ?Musculoskeletal: No deformities, no cyanosis or clubbing , no tremors ? ? ? ?   ?Assessment & Plan:  ? ? ?

## 2021-04-16 NOTE — Assessment & Plan Note (Signed)
With his low EF, he is not a candidate for ASV ventilation ?He is agreeable to ICD placement now await further cardiology management ?

## 2021-04-16 NOTE — Assessment & Plan Note (Signed)
He has treatment emergent centrals on his titration study .reviewed CPAP download. ?We will change him to auto settings 10 to 18 cm, DC ramp feature , this will allow him to have higher pressure. ?We will also add EPR +3 ?He has a large leak. ? ?If he is not able to settle down with about setting changes then we will consider BiPAP titration study.  I also discussed hypoglossal nerve stimulation.  He is considering an ICD device currently and we will see how that plays out.  Treatment emergent centrals will likely improve if his EF improves ?

## 2021-04-16 NOTE — Patient Instructions (Signed)
? ?  Change auto CPAP to 10-18 cm ?Dc ramp feature ?Add EPR +3 ? ? ?If above does not work , call me back & we can trial BiPAP ?

## 2021-06-04 ENCOUNTER — Encounter: Payer: Self-pay | Admitting: Urology

## 2021-06-04 ENCOUNTER — Ambulatory Visit (INDEPENDENT_AMBULATORY_CARE_PROVIDER_SITE_OTHER): Payer: Medicare Other | Admitting: Urology

## 2021-06-04 VITALS — BP 144/87 | HR 87

## 2021-06-04 DIAGNOSIS — R339 Retention of urine, unspecified: Secondary | ICD-10-CM

## 2021-06-04 DIAGNOSIS — N138 Other obstructive and reflux uropathy: Secondary | ICD-10-CM

## 2021-06-04 DIAGNOSIS — N5201 Erectile dysfunction due to arterial insufficiency: Secondary | ICD-10-CM

## 2021-06-04 DIAGNOSIS — N3281 Overactive bladder: Secondary | ICD-10-CM | POA: Diagnosis not present

## 2021-06-04 DIAGNOSIS — N401 Enlarged prostate with lower urinary tract symptoms: Secondary | ICD-10-CM | POA: Diagnosis not present

## 2021-06-04 LAB — URINALYSIS, ROUTINE W REFLEX MICROSCOPIC
Bilirubin, UA: NEGATIVE
Glucose, UA: NEGATIVE
Ketones, UA: NEGATIVE
Leukocytes,UA: NEGATIVE
Nitrite, UA: NEGATIVE
Protein,UA: NEGATIVE
RBC, UA: NEGATIVE
Specific Gravity, UA: >1.03 — ABNORMAL HIGH (ref 1.005–1.030)
Urobilinogen, Ur: 0.2 mg/dL (ref 0.2–1.0)
pH, UA: 6 (ref 5.0–7.5)

## 2021-06-04 LAB — BLADDER SCAN AMB NON-IMAGING: Scan Result: 1

## 2021-06-04 MED ORDER — GEMTESA 75 MG PO TABS: 1.0000 | ORAL_TABLET | Freq: Every day | ORAL | 0 refills | Status: DC

## 2021-06-04 MED ORDER — VARDENAFIL HCL 20 MG PO TABS: 20.0000 mg | ORAL_TABLET | Freq: Every day | ORAL | 0 refills | Status: DC | PRN

## 2021-06-04 NOTE — Progress Notes (Signed)
06/04/2021 10:30 AM   [name] [name]. 10/01/1965 [id number]  Referring provider: Rebekah Chesterfield, NP [address] [place],  [place] [address]  Followup BPH, erectile dysfunction and OAb   HPI: Mr Holsomback is a 55yo here for followup for BPH, erectile dysfunction and OAB. IPSS 5 QOl 2. Urine stream is strong. Nocturia 1-2x. No straining to urinate.  He is bothered by his urgency and urge incontinence. No Prior OAB therapy. Tadalafil failed to improve his erection. No other complaints today   PMH: Past Medical History:  Diagnosis Date   Anxiety    Arthritis    in lower back   Coronary artery disease    a. 02/2008: anterior STEMI s/p DES to prox LAD, b. Myoview 2012: scar in the anterior wall with mild ischemia and EF 33%, c. neg GXT 06/2011  d. 08/2016: cath with DES to diagonal    Depression with anxiety    Diabetes mellitus    TYPE II   Fatty liver    H/O ELEVATED LIVER ENZYMES   High cholesterol    Hyperlipidemia    Hypertension    Ischemic cardiomyopathy    a. Previous EF 33-35% 2012, b. 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)    Myocardial infarction (HCC)    Noncompliance    Prior hx of med noncompliance   OSA (obstructive sleep apnea)    Sleep apnea     Surgical History: Past Surgical History:  Procedure Laterality Date   CARDIAC CATHETERIZATION     CORONARY ANGIOPLASTY     CORONARY STENT INTERVENTION N/A 08/21/2016   Procedure: CORONARY STENT INTERVENTION;  Surgeon: Kathleene Hazel, MD;  [facility];  Service: Cardiovascular;  Laterality: N/A;   CYSTOSCOPY N/A 12/04/2019   Procedure: CYSTOSCOPY;  Surgeon: Malen Gauze, MD;  Location: [facility];  Service: Urology;  Laterality: N/A;   INTRAVASCULAR PRESSURE WIRE/FFR STUDY N/A 08/21/2016   Procedure: INTRAVASCULAR PRESSURE WIRE/FFR STUDY;  Surgeon: Kathleene Hazel, MD;  [facility]: [facility];  Service: Cardiovascular;   Laterality: N/A;   LIVER BIOPSY     MI WITH STENTS     NASAL SEPTOPLASTY W/ TURBINOPLASTY N/A 01/25/2019   Procedure: BILATERAL TURBINATE REDUCTION;  Surgeon: Newman Pies, MD;  Location: [facility];  Service: ENT;  Laterality: N/A;   NASAL SINUS SURGERY     RIGHT/LEFT HEART CATH AND CORONARY ANGIOGRAPHY N/A 08/21/2016   Procedure: Right/Left Heart Cath and Coronary Angiography;  Surgeon: Kathleene Hazel, MD;  [facility]: [facility];  Service: Cardiovascular;  Laterality: N/A;   TRANSURETHRAL RESECTION OF PROSTATE N/A 12/04/2019   Procedure: TRANSURETHRAL RESECTION OF THE PROSTATE (TURP);  Surgeon: Malen Gauze, MD;  Location: [facility];  Service: Urology;  Laterality: N/A;    Home Medications:  Allergies as of 06/04/2021   No Known Allergies      Medication List        Accurate as of Jun 04, 2021 10:30 AM. If you have any questions, ask your nurse or doctor.          aspirin EC 81 MG tablet Take 81 mg by mouth daily.   atorvastatin 80 MG tablet Commonly known as: LIPITOR Take 1 tablet (80 mg total) by mouth daily.   BD Pen Needle Nano 2nd Gen 32G X 4 MM Misc Generic drug: Insulin Pen Needle USE AS DIRECTED ONCE DAILY WITH SOLIQUA  carvedilol 25 MG tablet Commonly known as: COREG Take 25 mg by mouth 2 (two) times daily.   Entresto 97-103 MG Generic drug: sacubitril-valsartan Take 1 tablet by mouth twice daily   escitalopram 20 MG tablet Commonly known as: LEXAPRO Take 20 mg by mouth daily.   glipiZIDE 10 MG 24 hr tablet Commonly known as: GLUCOTROL XL Take 10 mg by mouth daily.   lisinopril 20 MG tablet Commonly known as: ZESTRIL Take 20 mg by mouth daily.   mirabegron ER 25 MG Tb24 tablet Commonly known as: MYRBETRIQ Take 1 tablet (25 mg total) by mouth daily.   nitroGLYCERIN 0.4 MG SL tablet Commonly known as: NITROSTAT Place 0.4 mg under the tongue every 5 (five) minutes as needed for chest pain.   Rybelsus 3 MG Tabs Generic drug:  Semaglutide Take 3 mg by mouth daily.   Rybelsus 7 MG Tabs Generic drug: Semaglutide Take 1 tablet by mouth every morning.   spironolactone 25 MG tablet Commonly known as: ALDACTONE Take 1 tablet (25 mg total) by mouth daily.   tadalafil 20 MG tablet Commonly known as: CIALIS Take 1 tablet (20 mg total) by mouth as needed.   Toujeo Max SoloStar 300 UNIT/ML Solostar Pen Generic drug: insulin glargine (2 Unit Dial) SMARTSIG:40 Unit(s) SUB-Q Daily        Allergies: No Known Allergies  Family History: Family History  Problem Relation Age of Onset   Coronary artery disease Other        UNKNOWN   Heart disease Father        CAD s/p CABG   Diabetes Mellitus II Father    Diabetes Mellitus II Mother    Cancer Brother     Social History:  reports that he has never smoked. He has never used smokeless tobacco. He reports that he does not drink alcohol and does not use drugs.  ROS: All other review of systems were reviewed and are negative except what is noted above in HPI  Physical Exam: BP (!) 144/87   Pulse 87   Constitutional:  Alert and oriented, No acute distress. HEENT: [place] AT, moist mucus membranes.  Trachea midline, no masses. Cardiovascular: No clubbing, cyanosis, or edema. Respiratory: Normal respiratory effort, no increased work of breathing. GI: Abdomen is soft, nontender, nondistended, no abdominal masses GU: No CVA tenderness.  Lymph: No cervical or inguinal lymphadenopathy. Skin: No rashes, bruises or suspicious lesions. Neurologic: Grossly intact, no focal deficits, moving all 4 extremities. Psychiatric: Normal mood and affect.  Laboratory Data: Lab Results  Component Value Date   WBC 10.2 12/05/2019   HGB 14.2 12/05/2019   HCT 44.4 12/05/2019   MCV 91.5 12/05/2019   PLT 247 12/05/2019    Lab Results  Component Value Date   CREATININE 0.72 12/05/2019    Lab Results  Component Value Date   PSA 0.5 07/12/2019    No results found for:  TESTOSTERONE  Lab Results  Component Value Date   HGBA1C 7.4 (H) 12/04/2019    Urinalysis    Component Value Date/Time   COLORURINE AMBER (A) 05/19/2019 1600   APPEARANCEUR Clear 12/04/2020 0902   LABSPEC 1.031 (H) 05/19/2019 1600   PHURINE 6.0 05/19/2019 1600   GLUCOSEU 3+ (A) 12/04/2020 0902   HGBUR LARGE (A) 05/19/2019 1600   BILIRUBINUR Negative 12/04/2020 0902   KETONESUR 20 (A) 05/19/2019 1600   PROTEINUR Negative 12/04/2020 0902   PROTEINUR >=300 (A) 05/19/2019 1600   UROBILINOGEN 0.2 09/29/2019 0910   NITRITE Negative 12/04/2020  0902   NITRITE NEGATIVE 05/19/2019 1600   LEUKOCYTESUR Negative 12/04/2020 0902   LEUKOCYTESUR NEGATIVE 05/19/2019 1600    Lab Results  Component Value Date   LABMICR See below: 12/04/2020   WBCUA 0-5 12/04/2020   LABEPIT 0-10 12/04/2020   MUCUS Present (A) 12/04/2020   BACTERIA Few (A) 12/04/2020    Pertinent Imaging:  No results found for this or any previous visit.  No results found for this or any previous visit.  No results found for this or any previous visit.  No results found for this or any previous visit.  No results found for this or any previous visit.  No results found for this or any previous visit.  No results found for this or any previous visit.  No results found for this or any previous visit.   Assessment & Plan:    1. Incomplete bladder emptying -resolved - BLADDER SCAN AMB NON-IMAGING - Urinalysis, Routine w reflex microscopic  2. Benign prostatic hyperplasia with urinary obstruction -gemtesa 75mg  daily - BLADDER SCAN AMB NON-IMAGING - Urinalysis, Routine w reflex microscopic  3. Erectile dysfunction due to arterial insufficiency -We will trial levitra 20mg    4. OAB (overactive bladder) -gemtesa 75mg  daily   No follow-ups on file.  Wilkie Aye, MD  [facility] [place]

## 2021-06-04 NOTE — Progress Notes (Signed)
post void residual=1 

## 2021-07-01 ENCOUNTER — Other Ambulatory Visit: Payer: Self-pay | Admitting: Cardiology

## 2021-07-08 ENCOUNTER — Other Ambulatory Visit: Payer: Self-pay | Admitting: Cardiology

## 2021-07-09 ENCOUNTER — Ambulatory Visit (INDEPENDENT_AMBULATORY_CARE_PROVIDER_SITE_OTHER): Payer: Medicare Other | Admitting: Urology

## 2021-07-09 VITALS — BP 129/85 | HR 71

## 2021-07-09 DIAGNOSIS — N5201 Erectile dysfunction due to arterial insufficiency: Secondary | ICD-10-CM | POA: Diagnosis not present

## 2021-07-09 DIAGNOSIS — N3281 Overactive bladder: Secondary | ICD-10-CM

## 2021-07-09 DIAGNOSIS — N138 Other obstructive and reflux uropathy: Secondary | ICD-10-CM

## 2021-07-09 DIAGNOSIS — N401 Enlarged prostate with lower urinary tract symptoms: Secondary | ICD-10-CM

## 2021-07-09 LAB — URINALYSIS, ROUTINE W REFLEX MICROSCOPIC
Bilirubin, UA: NEGATIVE
Ketones, UA: NEGATIVE
Leukocytes,UA: NEGATIVE
Nitrite, UA: NEGATIVE
Protein,UA: NEGATIVE
RBC, UA: NEGATIVE
Specific Gravity, UA: 1.02 (ref 1.005–1.030)
Urobilinogen, Ur: 0.2 mg/dL (ref 0.2–1.0)
pH, UA: 5.5 (ref 5.0–7.5)

## 2021-07-09 MED ORDER — AMBULATORY NON FORMULARY MEDICATION: 0.2000 mL | 5 refills | Status: DC | PRN

## 2021-07-09 MED ORDER — GEMTESA 75 MG PO TABS: 1.0000 | ORAL_TABLET | Freq: Every day | ORAL | 11 refills | Status: DC

## 2021-07-09 NOTE — Progress Notes (Unsigned)
07/09/2021 9:14 AM   [name] [name]. [birth year] [id number]  Referring provider: Rebekah Chesterfield, NP [address] [place],  [place] [address]  No chief complaint on file.   HPI:    PMH: Past Medical History:  Diagnosis Date   Anxiety    Arthritis    in lower back   Coronary artery disease    a. 02/2008: anterior STEMI s/p DES to prox LAD, b. Myoview 2012: scar in the anterior wall with mild ischemia and EF 33%, c. neg GXT 06/2011  d. 08/2016: cath with DES to diagonal    Depression with anxiety    Diabetes mellitus    TYPE II   Fatty liver    H/O ELEVATED LIVER ENZYMES   High cholesterol    Hyperlipidemia    Hypertension    Ischemic cardiomyopathy    a. Previous EF 33-35% 2012, b. 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)    Myocardial infarction (HCC)    Noncompliance    Prior hx of med noncompliance   OSA (obstructive sleep apnea)    Sleep apnea     Surgical History: Past Surgical History:  Procedure Laterality Date   CARDIAC CATHETERIZATION     CORONARY ANGIOPLASTY     CORONARY STENT INTERVENTION N/A 08/21/2016   Procedure: CORONARY STENT INTERVENTION;  Surgeon: Kathleene Hazel, MD;  [facility];  Service: Cardiovascular;  Laterality: N/A;   CYSTOSCOPY N/A 12/04/2019   Procedure: CYSTOSCOPY;  Surgeon: Malen Gauze, MD;  Location: [facility];  Service: Urology;  Laterality: N/A;   INTRAVASCULAR PRESSURE WIRE/FFR STUDY N/A 08/21/2016   Procedure: INTRAVASCULAR PRESSURE WIRE/FFR STUDY;  Surgeon: Kathleene Hazel, MD;  [facility]: [facility];  Service: Cardiovascular;  Laterality: N/A;   LIVER BIOPSY     MI WITH STENTS     NASAL SEPTOPLASTY W/ TURBINOPLASTY N/A 01/25/2019   Procedure: BILATERAL TURBINATE REDUCTION;  Surgeon: Newman Pies, MD;  Location: [facility];  Service: ENT;  Laterality: N/A;   NASAL SINUS SURGERY     RIGHT/LEFT HEART CATH AND CORONARY ANGIOGRAPHY N/A 08/21/2016    Procedure: Right/Left Heart Cath and Coronary Angiography;  Surgeon: Kathleene Hazel, MD;  [facility];  Service: Cardiovascular;  Laterality: N/A;   TRANSURETHRAL RESECTION OF PROSTATE N/A 12/04/2019   Procedure: TRANSURETHRAL RESECTION OF THE PROSTATE (TURP);  Surgeon: Malen Gauze, MD;  Location: [facility];  Service: Urology;  Laterality: N/A;    Home Medications:  Allergies as of 07/09/2021   No Known Allergies      Medication List        Accurate as of July 09, 2021  9:14 AM. If you have any questions, ask your nurse or doctor.          aspirin EC 81 MG tablet Take 81 mg by mouth daily.   atorvastatin 80 MG tablet Commonly known as: LIPITOR Take 1 tablet by mouth once daily   BD Pen Needle Nano 2nd Gen 32G X 4 MM Misc Generic drug: Insulin Pen Needle USE AS DIRECTED ONCE DAILY WITH SOLIQUA   carvedilol 25 MG tablet Commonly known as: COREG Take 25 mg by mouth 2 (two) times daily.   Entresto 97-103 MG Generic drug: sacubitril-valsartan Take 1 tablet by mouth twice daily   escitalopram 20 MG tablet Commonly known as: LEXAPRO Take 20 mg by mouth daily.   Gemtesa 75  MG Tabs Generic drug: Vibegron Take 1 capsule by mouth daily.   glipiZIDE 10 MG 24 hr tablet Commonly known as: GLUCOTROL XL Take 10 mg by mouth daily.   lisinopril 20 MG tablet Commonly known as: ZESTRIL Take 20 mg by mouth daily.   mirabegron ER 25 MG Tb24 tablet Commonly known as: MYRBETRIQ Take 1 tablet (25 mg total) by mouth daily.   nitroGLYCERIN 0.4 MG SL tablet Commonly known as: NITROSTAT Place 0.4 mg under the tongue every 5 (five) minutes as needed for chest pain.   Rybelsus 3 MG Tabs Generic drug: Semaglutide Take 3 mg by mouth daily.   Rybelsus 7 MG Tabs Generic drug: Semaglutide Take 1 tablet by mouth every morning.   spironolactone 25 MG tablet Commonly known as: ALDACTONE Take 1 tablet (25 mg total) by mouth daily.   tadalafil 20 MG  tablet Commonly known as: CIALIS Take 1 tablet (20 mg total) by mouth as needed.   Toujeo Max SoloStar 300 UNIT/ML Solostar Pen Generic drug: insulin glargine (2 Unit Dial) SMARTSIG:40 Unit(s) SUB-Q Daily   vardenafil 20 MG tablet Commonly known as: LEVITRA Take 1 tablet (20 mg total) by mouth daily as needed for erectile dysfunction.        Allergies: No Known Allergies  Family History: Family History  Problem Relation Age of Onset   Coronary artery disease Other        UNKNOWN   Heart disease Father        CAD s/p CABG   Diabetes Mellitus II Father    Diabetes Mellitus II Mother    Cancer Brother     Social History:  reports that he has never smoked. He has never used smokeless tobacco. He reports that he does not drink alcohol and does not use drugs.  ROS: All other review of systems were reviewed and are negative except what is noted above in HPI  Physical Exam: BP 129/85   Pulse 71   Constitutional:  Alert and oriented, No acute distress. HEENT: [place] AT, moist mucus membranes.  Trachea midline, no masses. Cardiovascular: No clubbing, cyanosis, or edema. Respiratory: Normal respiratory effort, no increased work of breathing. GI: Abdomen is soft, nontender, nondistended, no abdominal masses GU: No CVA tenderness.  Lymph: No cervical or inguinal lymphadenopathy. Skin: No rashes, bruises or suspicious lesions. Neurologic: Grossly intact, no focal deficits, moving all 4 extremities. Psychiatric: Normal mood and affect.  Laboratory Data: Lab Results  Component Value Date   WBC 10.2 12/05/2019   HGB 14.2 12/05/2019   HCT 44.4 12/05/2019   MCV 91.5 12/05/2019   PLT 247 12/05/2019    Lab Results  Component Value Date   CREATININE 0.72 12/05/2019    Lab Results  Component Value Date   PSA 0.5 07/12/2019    No results found for: "TESTOSTERONE"  Lab Results  Component Value Date   HGBA1C 7.4 (H) 12/04/2019    Urinalysis    Component Value Date/Time    COLORURINE AMBER (A) 05/19/2019 1600   APPEARANCEUR Clear 06/04/2021 1017   LABSPEC 1.031 (H) 05/19/2019 1600   PHURINE 6.0 05/19/2019 1600   GLUCOSEU Negative 06/04/2021 1017   HGBUR LARGE (A) 05/19/2019 1600   BILIRUBINUR Negative 06/04/2021 1017   KETONESUR 20 (A) 05/19/2019 1600   PROTEINUR Negative 06/04/2021 1017   PROTEINUR >=300 (A) 05/19/2019 1600   UROBILINOGEN 0.2 09/29/2019 0910   NITRITE Negative 06/04/2021 1017   NITRITE NEGATIVE 05/19/2019 1600   LEUKOCYTESUR Negative 06/04/2021 1017  LEUKOCYTESUR NEGATIVE 05/19/2019 1600    Lab Results  Component Value Date   LABMICR Comment 06/04/2021   WBCUA 0-5 12/04/2020   LABEPIT 0-10 12/04/2020   MUCUS Present (A) 12/04/2020   BACTERIA Few (A) 12/04/2020    Pertinent Imaging: *** No results found for this or any previous visit.  No results found for this or any previous visit.  No results found for this or any previous visit.  No results found for this or any previous visit.  No results found for this or any previous visit.  No results found for this or any previous visit.  No results found for this or any previous visit.  No results found for this or any previous visit.   Assessment & Plan:    1. Benign prostatic hyperplasia with urinary obstruction -continue gemtesa 75mg   - Urinalysis, Routine w reflex microscopic  2. OAB (overactive bladder) -continue gemtesa 75mg  daily  3. Erectile dysfunction due to arterial insufficiency -We will trial trimix injection   No follow-ups on file.  , MD  [facility] [place]

## 2021-07-15 ENCOUNTER — Encounter: Payer: Self-pay | Admitting: Urology

## 2021-07-17 ENCOUNTER — Ambulatory Visit: Payer: Medicare Other | Admitting: Pulmonary Disease

## 2021-07-17 ENCOUNTER — Encounter: Payer: Self-pay | Admitting: Pulmonary Disease

## 2021-07-17 ENCOUNTER — Ambulatory Visit (INDEPENDENT_AMBULATORY_CARE_PROVIDER_SITE_OTHER): Payer: Medicare Other | Admitting: Pulmonary Disease

## 2021-07-17 DIAGNOSIS — G4733 Obstructive sleep apnea (adult) (pediatric): Secondary | ICD-10-CM

## 2021-07-17 DIAGNOSIS — I255 Ischemic cardiomyopathy: Secondary | ICD-10-CM | POA: Diagnosis not present

## 2021-07-17 NOTE — Progress Notes (Signed)
   Subjective:    Patient ID: [name] [name]., male    DOB: 05-04-1965, 56 y.o.   MRN: [id number]  HPI  55-yo with ischemic cardiomyopathy for  FU of OSA -Treatment emergent centrals    PMH - AW- STEMI 02/2008, received DES to LAD & D2 in 2018  LVEF  25- 35%, not ready for ICD -Diabetes type 2 for 20 years, on insulin for 1 year   Chief Complaint  Patient presents with   Follow-up    Patient having a hard time with cpap. Wants to talk about inspire device   On his last visit we change CPAP settings to auto 10 to 18 cm.  He is just unable to tolerate the machine.  He reports pressure still high and cannot tolerate the air over his face.  Download shows poor compliance, average pressure 15 cm residual AHI of 11/hour, not many centrals. His weight has increased 15 pounds.  He denies shortness of breath.  Last cardiology evaluation was in December 2022.  Last echocardiogram was in 06/2018 showing an EF of 25 to 35%  Significant tests/ events reviewed  NPSG 08/2018 - wt 234 lbs -TST 337 minutes, RDI 17/hour, AHI 15.5/hour, no supine sleep, lowest desaturation 83%, 3 REM periods   CPAP titration 01/2021 >> sub opimal ? 10 cm, treatment emergent centrals +  PLMs+   Review of Systems Pt denies any significant  nasal congestion or excess secretions, fever, chills, sweats, unintended wt loss, pleuritic or exertional cp, orthopnea pnd or leg swelling.  Pt also denies any obvious fluctuation in symptoms with weather or environmental change or other alleviating or aggravating factors.    Pt denies any increase in rescue therapy over baseline, denies waking up needing it or having early am exacerbations or coughing/wheezing/ or dyspnea      Objective:   Physical Exam  Gen. Pleasant, obese, in no distress ENT - no lesions, no post nasal drip, 49mm underbite, poor dentition Neck: No JVD, no thyromegaly, no carotid bruits Lungs: no use of accessory muscles, no dullness to percussion, decreased  without rales or rhonchi  Cardiovascular: Rhythm regular, heart sounds  normal, no murmurs or gallops, no peripheral edema Musculoskeletal: No deformities, no cyanosis or clubbing , no tremors       Assessment & Plan:

## 2021-07-17 NOTE — Assessment & Plan Note (Signed)
He is unable to tolerate CPAP. Our options include -BiPAP titration study -Dental appliance -Hypoglossal nerve stimulator implant.  I discussed all these options in detail. We decided to proceed with dental referral to Dr. Lowella Bandy to see if a mouthguard will suffice.  Once he has this made, I would like to repeat a home sleep test and see if he can get his AHI to less than 15 range.  He has gained some weight in the interim so likely that his OSA is worse.  If that does not work then we will proceed with trial of BiPAP.  I would like to hold off on hypoglossal implantation to see if he requires ICD or not

## 2021-07-17 NOTE — Assessment & Plan Note (Signed)
Repeat 1 year echocardiogram to assess if EF has improved.  He needs cardiology follow-up

## 2021-07-17 NOTE — Patient Instructions (Signed)
   X schedule echocardiogram  X Refer to Dr Myrtis Ser -dentist for mouth guard for OSA

## 2021-07-30 ENCOUNTER — Ambulatory Visit: Payer: Medicare Other | Admitting: Urology

## 2021-07-30 DIAGNOSIS — N5201 Erectile dysfunction due to arterial insufficiency: Secondary | ICD-10-CM

## 2021-08-05 ENCOUNTER — Ambulatory Visit (INDEPENDENT_AMBULATORY_CARE_PROVIDER_SITE_OTHER): Payer: Medicare Other | Admitting: Urology

## 2021-08-05 ENCOUNTER — Ambulatory Visit (HOSPITAL_BASED_OUTPATIENT_CLINIC_OR_DEPARTMENT_OTHER): Payer: Medicare Other

## 2021-08-05 VITALS — BP 123/81 | HR 89

## 2021-08-05 DIAGNOSIS — N5201 Erectile dysfunction due to arterial insufficiency: Secondary | ICD-10-CM | POA: Diagnosis not present

## 2021-08-12 ENCOUNTER — Encounter: Payer: Self-pay | Admitting: Urology

## 2021-08-12 NOTE — Progress Notes (Signed)
08/05/2021 9:02 AM   [name] [name]. September 27, 1965 [id number]  Referring provider: Rebekah Chesterfield, NP [address] [place] [address] [place],  [place] [address]  Erectile dysfunction   HPI: [name] [name] is a 55yo here for trimix teaching   PMH: Past Medical History:  Diagnosis Date   Anxiety    Arthritis    in lower back   Coronary artery disease    a. 02/2008: anterior STEMI s/p DES to prox LAD, b. Myoview 2012: scar in the anterior wall with mild ischemia and EF 33%, c. neg GXT 06/2011  d. 08/2016: cath with DES to diagonal    Depression with anxiety    Diabetes mellitus    TYPE II   Fatty liver    H/O ELEVATED LIVER ENZYMES   High cholesterol    Hyperlipidemia    Hypertension    Ischemic cardiomyopathy    a. Previous EF 33-35% 2012, b. 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)    Myocardial infarction (HCC)    Noncompliance    Prior hx of med noncompliance   OSA (obstructive sleep apnea)    Sleep apnea     Surgical History: Past Surgical History:  Procedure Laterality Date   CARDIAC CATHETERIZATION     CORONARY ANGIOPLASTY     CORONARY STENT INTERVENTION N/A 08/21/2016   Procedure: CORONARY STENT INTERVENTION;  Surgeon: Kathleene Hazel, MD;  Location: [facility];  Service: Cardiovascular;  Laterality: N/A;   CYSTOSCOPY N/A 12/04/2019   Procedure: CYSTOSCOPY;  Surgeon: Malen Gauze, MD;  Location: [facility];  Service: Urology;  Laterality: N/A;   INTRAVASCULAR PRESSURE WIRE/FFR STUDY N/A 08/21/2016   Procedure: INTRAVASCULAR PRESSURE WIRE/FFR STUDY;  Surgeon: Kathleene Hazel, MD;  Location: [facility];  Service: Cardiovascular;  Laterality: N/A;   LIVER BIOPSY     MI WITH STENTS     NASAL SEPTOPLASTY W/ TURBINOPLASTY N/A 01/25/2019   Procedure: BILATERAL TURBINATE REDUCTION;  Surgeon: Newman Pies, MD;  Location: [facility];  Service: ENT;  Laterality: N/A;   NASAL SINUS SURGERY     RIGHT/LEFT HEART CATH AND  CORONARY ANGIOGRAPHY N/A 08/21/2016   Procedure: Right/Left Heart Cath and Coronary Angiography;  Surgeon: Kathleene Hazel, MD;  Location: [facility];  Service: Cardiovascular;  Laterality: N/A;   TRANSURETHRAL RESECTION OF PROSTATE N/A 12/04/2019   Procedure: TRANSURETHRAL RESECTION OF THE PROSTATE (TURP);  Surgeon: Malen Gauze, MD;  Location: [facility];  Service: Urology;  Laterality: N/A;    Home Medications:  Allergies as of 08/05/2021   No Known Allergies      Medication List        Accurate as of August 05, 2021 11:59 PM. If you have any questions, ask your nurse or doctor.          AMBULATORY NON FORMULARY MEDICATION 0.2 mLs by Intracavernosal route as needed. Medication Name: Trimix  PGE Pap 30mg  Phent 1mg    aspirin EC 81 MG tablet Take 81 mg by mouth daily.   atorvastatin 80 MG tablet Commonly known as: LIPITOR Take 1 tablet by mouth once daily   BD Pen Needle Nano 2nd Gen 32G X 4 MM Misc Generic drug: Insulin Pen Needle USE AS DIRECTED ONCE DAILY WITH SOLIQUA   carvedilol 25 MG tablet Commonly known as: COREG Take 25 mg by mouth 2 (two) times daily.   Entresto 97-103 MG Generic drug: sacubitril-valsartan  Take 1 tablet by mouth twice daily   escitalopram 20 MG tablet Commonly known as: LEXAPRO Take 20 mg by mouth daily.   Gemtesa 75 MG Tabs Generic drug: Vibegron Take 1 capsule by mouth daily.   glipiZIDE 10 MG 24 hr tablet Commonly known as: GLUCOTROL XL Take 10 mg by mouth daily.   lisinopril 20 MG tablet Commonly known as: ZESTRIL Take 20 mg by mouth daily.   mirabegron ER 25 MG Tb24 tablet Commonly known as: MYRBETRIQ Take 1 tablet (25 mg total) by mouth daily.   nitroGLYCERIN 0.4 MG SL tablet Commonly known as: NITROSTAT Place 0.4 mg under the tongue every 5 (five) minutes as needed for chest pain.   Rybelsus 3 MG Tabs Generic drug: Semaglutide Take 3 mg by mouth daily.   Rybelsus 7 MG Tabs Generic drug:  Semaglutide Take 1 tablet by mouth every morning.   spironolactone 25 MG tablet Commonly known as: ALDACTONE Take 1 tablet (25 mg total) by mouth daily.   tadalafil 20 MG tablet Commonly known as: CIALIS Take 1 tablet (20 mg total) by mouth as needed.   Toujeo Max SoloStar 300 UNIT/ML Solostar Pen Generic drug: insulin glargine (2 Unit Dial) SMARTSIG:40 Unit(s) SUB-Q Daily   vardenafil 20 MG tablet Commonly known as: LEVITRA Take 1 tablet (20 mg total) by mouth daily as needed for erectile dysfunction.        Allergies: No Known Allergies  Family History: Family History  Problem Relation Age of Onset   Coronary artery disease Other        UNKNOWN   Heart disease Father        CAD s/p CABG   Diabetes Mellitus II Father    Diabetes Mellitus II Mother    Cancer Brother     Social History:  reports that he has never smoked. He has never used smokeless tobacco. He reports that he does not drink alcohol and does not use drugs.  ROS: All other review of systems were reviewed and are negative except what is noted above in HPI  Physical Exam: BP 123/81   Pulse 89   Constitutional:  Alert and oriented, No acute distress. HEENT: [name] [name] AT, moist mucus membranes.  Trachea midline, no masses. Cardiovascular: No clubbing, cyanosis, or edema. Respiratory: Normal respiratory effort, no increased work of breathing. GI: Abdomen is soft, nontender, nondistended, no abdominal masses GU: No CVA tenderness.  Lymph: No cervical or inguinal lymphadenopathy. Skin: No rashes, bruises or suspicious lesions. Neurologic: Grossly intact, no focal deficits, moving all 4 extremities. Psychiatric: Normal mood and affect.  Laboratory Data: Lab Results  Component Value Date   WBC 10.2 12/05/2019   HGB 14.2 12/05/2019   HCT 44.4 12/05/2019   MCV 91.5 12/05/2019   PLT 247 12/05/2019    Lab Results  Component Value Date   CREATININE 0.72 12/05/2019    Lab Results  Component Value Date    PSA 0.5 07/12/2019    No results found for: "TESTOSTERONE"  Lab Results  Component Value Date   HGBA1C 7.4 (H) 12/04/2019    Urinalysis    Component Value Date/Time   COLORURINE AMBER (A) 05/19/2019 1600   APPEARANCEUR Clear 07/09/2021 1318   LABSPEC 1.031 (H) 05/19/2019 1600   PHURINE 6.0 05/19/2019 1600   GLUCOSEU 3+ (A) 07/09/2021 1318   HGBUR LARGE (A) 05/19/2019 1600   BILIRUBINUR Negative 07/09/2021 1318   KETONESUR 20 (A) 05/19/2019 1600   PROTEINUR Negative 07/09/2021 1318   PROTEINUR >=300 (A)  05/19/2019 1600   UROBILINOGEN 0.2 09/29/2019 0910   NITRITE Negative 07/09/2021 1318   NITRITE NEGATIVE 05/19/2019 1600   LEUKOCYTESUR Negative 07/09/2021 1318   LEUKOCYTESUR NEGATIVE 05/19/2019 1600    Lab Results  Component Value Date   LABMICR Comment 07/09/2021   WBCUA 0-5 12/04/2020   LABEPIT 0-10 12/04/2020   MUCUS Present (A) 12/04/2020   BACTERIA Few (A) 12/04/2020    Pertinent Imaging:  No results found for this or any previous visit.  No results found for this or any previous visit.  No results found for this or any previous visit.  No results found for this or any previous visit.  No results found for this or any previous visit.  No results found for this or any previous visit.  No results found for this or any previous visit.  No results found for this or any previous visit.  Trimix injection instruction:  Patient instructed to draw 0.16ml of trimix into the insulin syringe. I them instructed him to inject at the mid penile shaft at either the 3 or 9 o'clock position. I then injected the patient and he achieved a good erection in 20 minutes. Penile injection completed.    Assessment & Plan:    1. Erectile dysfunction due to arterial insufficiency The patient was instructed on proper technique for trimix injection. He is instructed to alternate side and location of the injection. He was instructed in titrating the trimix dose. He is  instructed to call the office for an erection lasting more than 4 hours    No follow-ups on file.  Wilkie Aye, MD  [facility]

## 2021-08-12 NOTE — Patient Instructions (Signed)
Erectile Dysfunction ?Erectile dysfunction (ED) is the inability to get or keep an erection in order to have sexual intercourse. ED is considered a symptom of an underlying disorder and is not considered a disease. ED may include: ?Inability to get an erection. ?Lack of enough hardness of the erection to allow penetration. ?Loss of erection before sex is finished. ?What are the causes? ?This condition may be caused by: ?Physical causes, such as: ?Artery problems. This may include heart disease, high blood pressure, atherosclerosis, and diabetes. ?Hormonal problems, such as low testosterone. ?Obesity. ?Nerve problems. This may include back or pelvic injuries, multiple sclerosis, Parkinson's disease, spinal cord injury, and stroke. ?Certain medicines, such as: ?Pain relievers. ?Antidepressants. ?Blood pressure medicines and water pills (diuretics). ?Cancer medicines. ?Antihistamines. ?Muscle relaxants. ?Lifestyle factors, such as: ?Use of drugs such as marijuana, cocaine, or opioids. ?Excessive use of alcohol. ?Smoking. ?Lack of physical activity or exercise. ?Psychological causes, such as: ?Anxiety or stress. ?Sadness or depression. ?Exhaustion. ?Fear about sexual performance. ?Guilt. ?What are the signs or symptoms? ?Symptoms of this condition include: ?Inability to get an erection. ?Lack of enough hardness of the erection to allow penetration. ?Loss of the erection before sex is finished. ?Sometimes having normal erections, but with frequent unsatisfactory episodes. ?Low sexual satisfaction in either partner due to erection problems. ?A curved penis occurring with erection. The curve may cause pain, or the penis may be too curved to allow for intercourse. ?Never having nighttime or morning erections. ?How is this diagnosed? ?This condition is often diagnosed by: ?Performing a physical exam to find other diseases or specific problems with the penis. ?Asking you detailed questions about the problem. ?Doing tests,  such as: ?Blood tests to check for diabetes mellitus or high cholesterol, or to measure hormone levels. ?Other tests to check for underlying health conditions. ?An ultrasound exam to check for scarring. ?A test to check blood flow to the penis. ?Doing a sleep study at home to measure nighttime erections. ?How is this treated? ?This condition may be treated by: ?Medicines, such as: ?Medicine taken by mouth to help you achieve an erection (oral medicine). ?Hormone replacement therapy to replace low testosterone levels. ?Medicine that is injected into the penis. Your health care provider may instruct you how to give yourself these injections at home. ?Medicine that is delivered with a short applicator tube. The tube is inserted into the opening at the tip of the penis, which is the opening of the urethra. A tiny pellet of medicine is put in the urethra. The pellet dissolves and enhances erectile function. This is also called MUSE (medicated urethral system for erections) therapy. ?Vacuum pump. This is a pump with a ring on it. The pump and ring are placed on the penis and used to create pressure that helps the penis become erect. ?Penile implant surgery. In this procedure, you may receive: ?An inflatable implant. This consists of cylinders, a pump, and a reservoir. The cylinders can be inflated with a fluid that helps to create an erection, and they can be deflated after intercourse. ?A semi-rigid implant. This consists of two silicone rubber rods. The rods provide some rigidity. They are also flexible, so the penis can both curve downward in its normal position and become straight for sexual intercourse. ?Blood vessel surgery to improve blood flow to the penis. During this procedure, a blood vessel from a different part of the body is placed into the penis to allow blood to flow around (bypass) damaged or blocked blood vessels. ?Lifestyle changes,   such as exercising more, losing weight, and quitting smoking. ?Follow  these instructions at home: ?Medicines ? ?Take over-the-counter and prescription medicines only as told by your health care provider. Do not increase the dosage without first discussing it with your health care provider. ?If you are using self-injections, do injections as directed by your health care provider. Make sure you avoid any veins that are on the surface of the penis. After giving an injection, apply pressure to the injection site for 5 minutes. ?Talk to your health care provider about how to prevent headaches while taking ED medicines. These medicines may cause a sudden headache due to the increase in blood flow in your body. ?General instructions ?Exercise regularly, as directed by your health care provider. Work with your health care provider to lose weight, if needed. ?Do not use any products that contain nicotine or tobacco. These products include cigarettes, chewing tobacco, and vaping devices, such as e-cigarettes. If you need help quitting, ask your health care provider. ?Before using a vacuum pump, read the instructions that come with the pump and discuss any questions with your health care provider. ?Keep all follow-up visits. This is important. ?Contact a health care provider if: ?You feel nauseous. ?You are vomiting. ?You get sudden headaches while taking ED medicines. ?You have any concerns about your sexual health. ?Get help right away if: ?You are taking oral or injectable medicines and you have an erection that lasts longer than 4 hours. If your health care provider is unavailable, go to the nearest emergency room for evaluation. An erection that lasts much longer than 4 hours can result in permanent damage to your penis. ?You have severe pain in your groin or abdomen. ?You develop redness or severe swelling of your penis. ?You have redness spreading at your groin or lower abdomen. ?You are unable to urinate. ?You experience chest pain or a rapid heartbeat (palpitations) after taking oral  medicines. ?These symptoms may represent a serious problem that is an emergency. Do not wait to see if the symptoms will go away. Get medical help right away. Call your local emergency services (911 in the [place] Do not drive yourself to the hospital. ?Summary ?Erectile dysfunction (ED) is the inability to get or keep an erection during sexual intercourse. ?This condition is diagnosed based on a physical exam, your symptoms, and tests to determine the cause. Treatment varies depending on the cause and may include medicines, hormone therapy, surgery, or a vacuum pump. ?You may need follow-up visits to make sure that you are using your medicines or devices correctly. ?Get help right away if you are taking or injecting medicines and you have an erection that lasts longer than 4 hours. ?This information is not intended to replace advice given to you by your health care provider. Make sure you discuss any questions you have with your health care provider. ?Document Revised: 04/03/2020 Document Reviewed: 04/03/2020 ?Elsevier Patient Education ? 2023 [employer] ? ?

## 2021-08-13 ENCOUNTER — Ambulatory Visit (HOSPITAL_COMMUNITY)
Admission: RE | Admit: 2021-08-13 | Discharge: 2021-08-13 | Disposition: A | Discharge status: Home / Self Care | Payer: Medicare Other | Source: Ambulatory Visit | Attending: Pulmonary Disease | Admitting: Pulmonary Disease

## 2021-08-13 DIAGNOSIS — I255 Ischemic cardiomyopathy: Secondary | ICD-10-CM | POA: Diagnosis not present

## 2021-08-13 LAB — ECHOCARDIOGRAM COMPLETE
Area-P 1/2: 3.27 cm2
S' Lateral: 5.4 cm

## 2021-08-13 MED ORDER — PERFLUTREN LIPID MICROSPHERE
1.0000 mL | INTRAVENOUS | Status: AC | PRN
  Administered 2021-08-13: 3 mL via INTRAVENOUS

## 2021-08-13 NOTE — Progress Notes (Addendum)
*  PRELIMINARY RESULTS* Echocardiogram 2D Echocardiogram has been performed with Definity.  Stacey Drain 08/13/2021, 9:10 AM

## 2021-11-06 ENCOUNTER — Telehealth: Payer: Self-pay | Admitting: Cardiology

## 2021-11-06 ENCOUNTER — Encounter: Payer: Self-pay | Admitting: *Deleted

## 2021-11-06 ENCOUNTER — Ambulatory Visit: Payer: Medicare Other | Attending: Cardiology | Admitting: Cardiology

## 2021-11-06 ENCOUNTER — Encounter: Payer: Self-pay | Admitting: Cardiology

## 2021-11-06 VITALS — BP 110/82 | HR 76 | Ht 70.0 in | Wt 256.8 lb

## 2021-11-06 DIAGNOSIS — I5022 Chronic systolic (congestive) heart failure: Secondary | ICD-10-CM | POA: Diagnosis not present

## 2021-11-06 DIAGNOSIS — I10 Essential (primary) hypertension: Secondary | ICD-10-CM

## 2021-11-06 DIAGNOSIS — I2510 Atherosclerotic heart disease of native coronary artery without angina pectoris: Secondary | ICD-10-CM | POA: Diagnosis not present

## 2021-11-06 DIAGNOSIS — R079 Chest pain, unspecified: Secondary | ICD-10-CM

## 2021-11-06 DIAGNOSIS — I255 Ischemic cardiomyopathy: Secondary | ICD-10-CM

## 2021-11-06 NOTE — Patient Instructions (Addendum)
Medication Instructions:  Continue all current medications.  Labwork: none  Testing/Procedures: Your physician has requested that you have a lexiscan myoview. For further information please visit [email or web address]. Please follow instruction sheet, as given.  Office will contact with results via phone, letter or mychart.     Follow-Up: 6 months   Any Other Special Instructions Will Be Listed Below (If Applicable).   If you need a refill on your cardiac medications before your next appointment, please call your pharmacy.  

## 2021-11-06 NOTE — Progress Notes (Signed)
Clinical Summary Mr. Mierzejewski is a 56 y.o.male seen today for follow up of the following medical problems.    1. CAD/ICM/Chronic systolic HF - history of anterior STEMI 02/2008, received DES to LAD. LVEF at that time was 35% - 2012 myoview anterior scar with mild ischemia - 06/2011 echo LVEF 45-50% - 06/2011 GXT no ischemia  - 07/2016 echo shows LVEF down to 30-35%, diffuse hypokinesis with akinesis of anerior and anteroseptal walls. Evidence of LV thrombus - with drop in LVEF and ongoing chest pain was referred for cath  08/2016 cath: see report below, received DES to D2 99%. Ok to stop ASA once coumadin is therapeutic per interventional cardiology, continue plavix.  11/2016 echo LVEF 35%, apical thrombus - 02/2017 echo LVEF 35-40%, restrictive, no clear thrombus    Jan 2020 nuclear stress: large anterior/anteroseptal scar, LVEF 22%.   - 06/2020 echo LVEF 25% - was on jardiance, stopped causing incontinence related to prior urology procedure.      - has seen EP and discussed ICD, patient asked to give ICD some thought. Has not made up mind     07/2021 echo: LVEF LVEF 25-30% - some recent chest pains, SOB - chest pain pressure right sided into midchest, can occur at rest or with exertion. Some associated SOB. Not positional. Lasts a few minutes. Often when upset, lots of stress dealing with sick mother. Occurs 1-2 times per week - compliant with meds - no recent edema.     2. History of prior LV thrombus - noted on 11/2016 echo -repeat echo 02/2017 showed resolution of clot, coumadin was stopped.  -06/2020 echo no thrombus     3. HTN - compliant with meds     4. OSA - followed by Dr Bryson Dames   5.Hyperlipidemia 06/2020 TC 208 TG 116 HDL 38 LDL 149 - changed from simva to atorvastatin 80mg  daily  -upcoming labs pcp     Past Medical History:  Diagnosis Date   Anxiety    Arthritis    in lower back   Coronary artery disease    a. 02/2008: anterior STEMI s/p DES to prox LAD,  b. Myoview 2012: scar in the anterior wall with mild ischemia and EF 33%, c. neg GXT 06/2011  d. 08/2016: cath with DES to diagonal    Depression with anxiety    Diabetes mellitus    TYPE II   Fatty liver    H/O ELEVATED LIVER ENZYMES   High cholesterol    Hyperlipidemia    Hypertension    Ischemic cardiomyopathy    a. Previous EF 33-35% 2012, b. 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)    Myocardial infarction (HCC)    Noncompliance    Prior hx of med noncompliance   OSA (obstructive sleep apnea)    Sleep apnea      No Known Allergies   Current Outpatient Medications  Medication Sig Dispense Refill   AMBULATORY NON FORMULARY MEDICATION 0.2 mLs by Intracavernosal route as needed. Medication Name: Trimix  PGE 07/2011 Pap 30mg  Phent 1mg  5 mL 5   aspirin EC 81 MG tablet Take 81 mg by mouth daily.     atorvastatin (LIPITOR) 80 MG tablet Take 1 tablet by mouth once daily 30 tablet 3   BD PEN NEEDLE NANO 2ND GEN 32G X 4 MM MISC USE AS DIRECTED ONCE DAILY WITH SOLIQUA     carvedilol (COREG) 25 MG tablet  Take 25 mg by mouth 2 (two) times daily.     ENTRESTO 97-103 MG Take 1 tablet by mouth twice daily 60 tablet 6   escitalopram (LEXAPRO) 20 MG tablet Take 20 mg by mouth daily.     glipiZIDE (GLUCOTROL XL) 10 MG 24 hr tablet Take 10 mg by mouth daily.     lisinopril (ZESTRIL) 20 MG tablet Take 20 mg by mouth daily.     mirabegron ER (MYRBETRIQ) 25 MG TB24 tablet Take 1 tablet (25 mg total) by mouth daily. 30 tablet 0   nitroGLYCERIN (NITROSTAT) 0.4 MG SL tablet Place 0.4 mg under the tongue every 5 (five) minutes as needed for chest pain.     RYBELSUS 7 MG TABS Take 1 tablet by mouth every morning.     Semaglutide (RYBELSUS) 3 MG TABS Take 3 mg by mouth daily.     spironolactone (ALDACTONE) 25 MG tablet Take 1 tablet (25 mg total) by mouth daily. 90 tablet 3   tadalafil (CIALIS) 20 MG tablet Take 1 tablet (20 mg total) by mouth as needed.  10 tablet 11   TOUJEO MAX SOLOSTAR 300 UNIT/ML Solostar Pen SMARTSIG:40 Unit(s) SUB-Q Daily     vardenafil (LEVITRA) 20 MG tablet Take 1 tablet (20 mg total) by mouth daily as needed for erectile dysfunction. 6 tablet 0   Vibegron (GEMTESA) 75 MG TABS Take 1 capsule by mouth daily. 30 tablet 11   No current facility-administered medications for this visit.     Past Surgical History:  Procedure Laterality Date   CARDIAC CATHETERIZATION     CORONARY ANGIOPLASTY     CORONARY STENT INTERVENTION N/A 08/21/2016   Procedure: CORONARY STENT INTERVENTION;  Surgeon: Kathleene Hazel, MD;  Location: [facility];  Service: Cardiovascular;  Laterality: N/A;   CYSTOSCOPY N/A 12/04/2019   Procedure: CYSTOSCOPY;  Surgeon: Malen Gauze, MD;  Location: [facility];  Service: Urology;  Laterality: N/A;   INTRAVASCULAR PRESSURE WIRE/FFR STUDY N/A 08/21/2016   Procedure: INTRAVASCULAR PRESSURE WIRE/FFR STUDY;  Surgeon: Kathleene Hazel, MD;  Location: [facility];  Service: Cardiovascular;  Laterality: N/A;   LIVER BIOPSY     MI WITH STENTS     NASAL SEPTOPLASTY W/ TURBINOPLASTY N/A 01/25/2019   Procedure: BILATERAL TURBINATE REDUCTION;  Surgeon: Newman Pies, MD;  Location: [facility];  Service: ENT;  Laterality: N/A;   NASAL SINUS SURGERY     RIGHT/LEFT HEART CATH AND CORONARY ANGIOGRAPHY N/A 08/21/2016   Procedure: Right/Left Heart Cath and Coronary Angiography;  Surgeon: Kathleene Hazel, MD;  Location: [facility];  Service: Cardiovascular;  Laterality: N/A;   TRANSURETHRAL RESECTION OF PROSTATE N/A 12/04/2019   Procedure: TRANSURETHRAL RESECTION OF THE PROSTATE (TURP);  Surgeon: Malen Gauze, MD;  Location: [facility];  Service: Urology;  Laterality: N/A;     No Known Allergies    Family History  Problem Relation Age of Onset   Coronary artery disease Other        UNKNOWN   Heart disease Father        CAD s/p CABG   Diabetes Mellitus II Father    Diabetes  Mellitus II Mother    Cancer Brother      Social History Mr. Saldivar reports that he has never smoked. He has never used smokeless tobacco. Mr. Berger reports no history of alcohol use.   Review of Systems CONSTITUTIONAL: No weight loss, fever, chills, weakness or fatigue.  HEENT: Eyes: No visual loss,  blurred vision, double vision or yellow sclerae.No hearing loss, sneezing, congestion, runny nose or sore throat.  SKIN: No rash or itching.  CARDIOVASCULAR: per hpi RESPIRATORY: No shortness of breath, cough or sputum.  GASTROINTESTINAL: No anorexia, nausea, vomiting or diarrhea. No abdominal pain or blood.  GENITOURINARY: No burning on urination, no polyuria NEUROLOGICAL: No headache, dizziness, syncope, paralysis, ataxia, numbness or tingling in the extremities. No change in bowel or bladder control.  MUSCULOSKELETAL: No muscle, back pain, joint pain or stiffness.  LYMPHATICS: No enlarged nodes. No history of splenectomy.  PSYCHIATRIC: No history of depression or anxiety.  ENDOCRINOLOGIC: No reports of sweating, cold or heat intolerance. No polyuria or polydipsia.  Marland Kitchen   Physical Examination Today's Vitals   11/06/21 1353  BP: 110/82  Pulse: 76  SpO2: 92%  Weight: 256 lb 12.8 oz (116.5 kg)  Height: 5\' 10"  (1.778 m)   Body mass index is 36.85 kg/m.  Gen: resting comfortably, no acute distress HEENT: no scleral icterus, pupils equal round and reactive, no palptable cervical adenopathy,  CV: RRR, no mrg, no jvd Resp: Clear to auscultation bilaterally GI: abdomen is soft, non-tender, non-distended, normal bowel sounds, no hepatosplenomegaly MSK: extremities are warm, no edema.  Skin: warm, no rash Neuro:  no focal deficits Psych: appropriate affect   Diagnostic Studies  6./2013 echo Study Conclusions  - Left ventricle: Systolic function was mildly reduced. The   estimated ejection fraction was in the range of 45% to   50%. - Left atrium: The atrium was mildly dilated.    08/07/16 echo Study Conclusions   - Left ventricle: LV systolic function is depressed at   approximately 30 to 35% with severe hypokinesis/akinesis of the   inferior, septal, distal lateral and apical walls. Cannot exclude   mural thrombus at apex. Suggest limited echo with Definity to   define The cavity size was normal. Wall thickness was normal. - Mitral valve: There was mild regurgitation. - Left atrium: The atrium was mildly dilated.   08/14/16 echo Study Conclusions   - Procedure narrative: Transthoracic echocardiography. Image   quality was adequate. The study was technically difficult.   Intravenous contrast (Definity) was administered. - Left ventricle: Systolic function was moderately to severely   reduced. The estimated ejection fraction was in the range of 30%   to 35%. Diffuse hypokinesis. There is akinesis of the   anteroseptal, anterior, and apical myocardium. - Right ventricle: Systolic function was normal.   Impressions:   - Limited study with Definity contrast. LVEF is in the range of   30-35%. There is a loosely organized LV apical thrombus noted.   Diffuse hypokinesis overall with akinesis of the anterior,   anteroseptal, and apical myocardium. RV contraction normal based   on limited views.   08/2016 cath Mid RCA-2 lesion, 30 %stenosed. Mid RCA-1 lesion, 30 %stenosed. Ost Ramus to Ramus lesion, 20 %stenosed. Ost LAD to Prox LAD lesion, 10 %stenosed. Prox LAD to Mid LAD lesion, 60 %stenosed. A STENT SYNERGY DES 2.25X12 drug eluting stent was successfully placed. 2nd Diag lesion, 99 %stenosed. Post intervention, there is a 0% residual stenosis.   1. Patent stent proximal LAD 2. Moderate stenosis mid LAD. FFR was 0.84 suggesting the stenosis was not flow limiting. 3. The Diagonal Jahleah Mariscal is a moderate caliber vessel with a 99% stenosis. 4. Successful PTCA/DES x 1 Diagonal Lawyer [place]augh 5. Mild disease RCA   Recommendations: Will continue ASA and Plavix for now.  He will need to be started on coumadin  tomorrow before discharge for his LV thrombus. He can stop ASA when his INR is therapeutic on coumadin since he was not an ACS. I would plan long term coumadin for LV thrombus and Plavix for at least one year given placement of DES. Continue statin and beta blocker.    02/2017 echo Study Conclusions   - Left ventricle: The cavity size was normal. Wall thickness was   increased in a pattern of mild LVH. Systolic function was   moderately reduced. The estimated ejection fraction was in the   range of 35% to 40%. No definitive thrombus seen with contrast   enhancement. Diffuse hypokinesis. Diastolic dysfunction with   restrictive physiology. - Regional wall motion abnormality: Akinesis of the mid anterior   and basal-mid anteroseptal myocardium. - Right ventricle: Systolic function was reduced.     Jan 2020 nuclear stress No diagnostic ST segment changes to indicate ischemia. Large, dense, anterior and anteroseptal defect extending from apex to base consistent with infarct scar. This is a high risk study. Nuclear stress EF: 22%. Consistent with ischemic cardiomyopathy and large anterior/anteroseptal infarct scar, no significant ischemia.    07/2021 echo 1. Septal, apical , distal anterior wall and inferior hypokinesis No  mural apical thormbus EF similar to prior echo 07/08/20 Findings consistent  with ischemic DCM . Left ventricular ejection fraction, by estimation, is  25 to 30%. The left ventricle has  severely decreased function. The left ventricle demonstrates regional wall  motion abnormalities (see scoring diagram/findings for description). The  left ventricular internal cavity size was severely dilated. Left  ventricular diastolic parameters were  normal.   2. Right ventricular systolic function is normal. The right ventricular  size is normal.   3. Left atrial size was mildly dilated.   4. The mitral valve is normal in structure. Trivial  mitral valve  regurgitation. No evidence of mitral stenosis.   5. The aortic valve is tricuspid. Aortic valve regurgitation is not  visualized. No aortic stenosis is present.   6. The inferior vena cava is normal in size with greater than 50%  respiratory variability, suggesting right atrial pressure of 3 mmHg.   Assessment and Plan  1. CAD/ICM/chronic systolic HF -  Fatigue on higher coreg dosing. Prior issues with orthostatic symptoms had limited medical therapy but these have resolved and have made recent titrations.  - of note LVEF dropped to 35% in 2018. Had cath at time time with PCI as reported above. LVEF has not improved since that time and has further declined. Jan 2020 nuclear stress with just scar, no ischemia.   - did not tolerate jardiance due to urinary symptoms - has not wished to proceed with ICD as of yet   - some recent chest pains unclear etiology, will obtain lexiscan to evaluate for any underlying ischemia - continue current meds - EKG SR, no acute ischemic changes     2. Hyperlipidemia - request labs from pcp, continue current meds    F/u 6 months    Antoine Poche, M.D.

## 2021-11-06 NOTE — Telephone Encounter (Signed)
Checking percert on the following patient for testing scheduled at [facility].     LEXISCAN  11-11-2021

## 2021-11-11 ENCOUNTER — Ambulatory Visit (HOSPITAL_COMMUNITY)
Admission: RE | Admit: 2021-11-11 | Discharge: 2021-11-11 | Disposition: A | Discharge status: Home / Self Care | Payer: Medicare Other | Source: Ambulatory Visit | Attending: Cardiology | Admitting: Cardiology

## 2021-11-11 ENCOUNTER — Encounter (HOSPITAL_COMMUNITY): Payer: Self-pay

## 2021-11-11 ENCOUNTER — Encounter (HOSPITAL_COMMUNITY)
Admission: RE | Admit: 2021-11-11 | Discharge: 2021-11-11 | Disposition: A | Discharge status: Home / Self Care | Payer: Medicare Other | Source: Ambulatory Visit | Attending: Cardiology | Admitting: Cardiology

## 2021-11-11 DIAGNOSIS — R079 Chest pain, unspecified: Secondary | ICD-10-CM | POA: Diagnosis present

## 2021-11-11 LAB — NM MYOCAR MULTI W/SPECT W/WALL MOTION / EF
LV dias vol: 251 mL (ref 62–150)
LV sys vol: 190 mL
Nuc Stress EF: 24 %
Peak HR: 87 {beats}/min
RATE: 0.5
Rest HR: 75 {beats}/min
Rest Nuclear Isotope Dose: 10.6 mCi
SDS: 5
SRS: 23
SSS: 28
ST Depression (mm): 0 mm
Stress Nuclear Isotope Dose: 31 mCi
TID: 1.07

## 2021-11-11 MED ORDER — SODIUM CHLORIDE FLUSH 0.9 % IV SOLN
INTRAVENOUS | Status: AC
  Administered 2021-11-11: 10 mL via INTRAVENOUS
  Filled 2021-11-11: qty 10

## 2021-11-11 MED ORDER — REGADENOSON 0.4 MG/5ML IV SOLN
INTRAVENOUS | Status: AC
  Administered 2021-11-11: 0.4 mg via INTRAVENOUS
  Filled 2021-11-11: qty 5

## 2021-11-11 MED ORDER — TECHNETIUM TC 99M TETROFOSMIN IV KIT
30.0000 | PACK | Freq: Once | INTRAVENOUS | Status: AC | PRN
  Administered 2021-11-11: 31 via INTRAVENOUS

## 2021-11-11 MED ORDER — TECHNETIUM TC 99M TETROFOSMIN IV KIT
10.0000 | PACK | Freq: Once | INTRAVENOUS | Status: AC | PRN
  Administered 2021-11-11: 10.6 via INTRAVENOUS

## 2021-11-14 ENCOUNTER — Telehealth: Payer: Self-pay | Admitting: Cardiology

## 2021-11-14 NOTE — Telephone Encounter (Signed)
Patient made aware via results note. 

## 2021-11-14 NOTE — Telephone Encounter (Signed)
Pt calling for stress test results °

## 2021-11-19 ENCOUNTER — Encounter: Payer: Self-pay | Admitting: *Deleted

## 2021-11-27 ENCOUNTER — Other Ambulatory Visit: Payer: Self-pay | Admitting: Cardiology

## 2021-12-30 ENCOUNTER — Encounter: Payer: Self-pay | Admitting: Pulmonary Disease

## 2021-12-30 ENCOUNTER — Ambulatory Visit (INDEPENDENT_AMBULATORY_CARE_PROVIDER_SITE_OTHER): Payer: Medicare Other | Admitting: Pulmonary Disease

## 2021-12-30 VITALS — BP 126/84 | HR 89 | Ht 70.0 in | Wt 245.6 lb

## 2021-12-30 DIAGNOSIS — G4733 Obstructive sleep apnea (adult) (pediatric): Secondary | ICD-10-CM

## 2021-12-30 NOTE — Progress Notes (Signed)
   Subjective:    Patient ID: [name] [name]., male    DOB: Feb 16, 1965, 56 y.o.   MRN: [id number]  HPI  56 yo with ischemic cardiomyopathy for  FU of OSA -Treatment emergent centrals    PMH - AW- STEMI 02/2008, received DES to LAD & D2 in 2018  LVEF  25- 35%, not ready for ICD -Diabetes type 2 for 20 years, on insulin for 1 year   Chief Complaint  Patient presents with   Follow-up    Pt f/u he is having a difficult time with sleep, he doesn't like having anything on his face or in his mouth. He feels like inspire will be his best option   56-month follow-up visit He was unable to tolerate CPAP He went to the dentist and feels he would not be able to use a mouthguard. 07/2021 EF was 25 to 30% Reports persistent headaches.  Reports somnolence while watching TV or as a passenger in a car.  Weight has increased by 16 pounds BMI is 35.2  Significant tests/ events reviewed  NPSG 08/2018 - wt 234 lbs -TST 337 minutes, RDI 17/hour, AHI 15.5/hour, no supine sleep, lowest desaturation 83%, 3 REM periods   CPAP titration 01/2021 >> sub opimal ? 10 cm, treatment emergent centrals +  PLMs+  Review of Systems neg for any significant sore throat, dysphagia, itching, sneezing, nasal congestion or excess/ purulent secretions, fever, chills, sweats, unintended wt loss, pleuritic or exertional cp, hempoptysis, orthopnea pnd or change in chronic leg swelling. Also denies presyncope, palpitations, heartburn, abdominal pain, nausea, vomiting, diarrhea or change in bowel or urinary habits, dysuria,hematuria, rash, arthralgias, visual complaints, headache, numbness weakness or ataxia.     Objective:   Physical Exam  Gen. Pleasant, obese, in no distress ENT - no lesions, no post nasal drip, 105mm overbite Neck: No JVD, no thyromegaly, no carotid bruits Lungs: no use of accessory muscles, no dullness to percussion, decreased without rales or rhonchi  Cardiovascular: Rhythm regular, heart sounds  normal, no  murmurs or gallops, no peripheral edema Musculoskeletal: No deformities, no cyanosis or clubbing , no tremors       Assessment & Plan:

## 2021-12-30 NOTE — Patient Instructions (Addendum)
  X Home sleep test  Based on this , we will refer you to ENT for sleep pnea implant  X Repeat weight

## 2021-12-30 NOTE — Assessment & Plan Note (Signed)
We will repeat home sleep test to reassess degree of sleep disordered breathing.  Previously he had AHI of 15/hours suggesting moderate OSA.  He has been able to tolerate mouthguard or CPAP. If he has more severe OSA on repeat testing then we will consider referring him for hypoglossal nerve stimulator implant. I discussed pros and cons of this therapy. He is still contemplating whether he would have ICD placed for his cardiomyopathy

## 2022-01-08 ENCOUNTER — Other Ambulatory Visit: Payer: Self-pay | Admitting: Cardiology

## 2022-01-28 ENCOUNTER — Ambulatory Visit: Payer: Medicare Other

## 2022-01-28 DIAGNOSIS — G4733 Obstructive sleep apnea (adult) (pediatric): Secondary | ICD-10-CM

## 2022-02-03 ENCOUNTER — Telehealth: Payer: Self-pay | Admitting: Pulmonary Disease

## 2022-02-03 DIAGNOSIS — G4733 Obstructive sleep apnea (adult) (pediatric): Secondary | ICD-10-CM | POA: Diagnosis not present

## 2022-02-03 NOTE — Telephone Encounter (Signed)
HST showed severe OSA with AHI 36/ hr  Since he is unable to tolerate CPAP therapy, we will refer him for inspire implant Please refer to ENT Dr. Redmond Baseman

## 2022-02-03 NOTE — Telephone Encounter (Signed)
Spoke with patient regarding hst results. They verbalized understanding. No further questions.  Nothing further needed at this time. He is agreeable to ENT referral for inspire

## 2022-02-04 ENCOUNTER — Encounter: Payer: Self-pay | Admitting: Urology

## 2022-02-04 ENCOUNTER — Ambulatory Visit (INDEPENDENT_AMBULATORY_CARE_PROVIDER_SITE_OTHER): Payer: Medicare Other | Admitting: Urology

## 2022-02-04 VITALS — BP 128/88 | HR 81

## 2022-02-04 DIAGNOSIS — N401 Enlarged prostate with lower urinary tract symptoms: Secondary | ICD-10-CM | POA: Diagnosis not present

## 2022-02-04 DIAGNOSIS — R339 Retention of urine, unspecified: Secondary | ICD-10-CM | POA: Diagnosis not present

## 2022-02-04 DIAGNOSIS — N138 Other obstructive and reflux uropathy: Secondary | ICD-10-CM

## 2022-02-04 DIAGNOSIS — N5201 Erectile dysfunction due to arterial insufficiency: Secondary | ICD-10-CM

## 2022-02-04 DIAGNOSIS — N3281 Overactive bladder: Secondary | ICD-10-CM | POA: Diagnosis not present

## 2022-02-04 LAB — URINALYSIS, ROUTINE W REFLEX MICROSCOPIC
Bilirubin, UA: NEGATIVE
Glucose, UA: NEGATIVE
Ketones, UA: NEGATIVE
Leukocytes,UA: NEGATIVE
Nitrite, UA: NEGATIVE
Protein,UA: NEGATIVE
RBC, UA: NEGATIVE
Specific Gravity, UA: 1.015 (ref 1.005–1.030)
Urobilinogen, Ur: 0.2 mg/dL (ref 0.2–1.0)
pH, UA: 5.5 (ref 5.0–7.5)

## 2022-02-04 LAB — BLADDER SCAN AMB NON-IMAGING: Scan Result: 162

## 2022-02-04 MED ORDER — SOLIFENACIN SUCCINATE 5 MG PO TABS: 5.0000 mg | ORAL_TABLET | Freq: Every day | ORAL | 11 refills | Status: DC

## 2022-02-04 NOTE — Patient Instructions (Signed)

## 2022-02-04 NOTE — Progress Notes (Signed)
post void residual=162 °

## 2022-02-04 NOTE — Progress Notes (Signed)
02/04/2022 8:59 AM   Aloha Gell. 1965-12-16 [id number]  Referring provider: Adaline Sill, NP [address] [place],  [place] [address]  Followup OAB and erectile dysfunction   HPI: [name] is a 57yo here for followup for OAB, BPH and erectile dysfunction. He notes worsening urinary urgency and frequency. He is currently on gemtesa. His blood sugars have been 160-300. He has nocturia 2-4x. Urine stream is strong. No straining to urinate. He uses 0.87ml trimix with fair results. PVR today is 162cc.    PMH: Past Medical History:  Diagnosis Date   Anxiety    Arthritis    in lower back   Coronary artery disease    a. 02/2008: anterior STEMI s/p DES to prox LAD, b. Myoview 2012: scar in the anterior wall with mild ischemia and EF 33%, c. neg GXT 06/2011  d. 08/2016: cath with DES to diagonal    Depression with anxiety    Diabetes mellitus    TYPE II   Fatty liver    H/O ELEVATED LIVER ENZYMES   High cholesterol    Hyperlipidemia    Hypertension    Ischemic cardiomyopathy    a. Previous EF 33-35% 2012, b. 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 ([place])    Myocardial infarction ([place])    Noncompliance    Prior hx of med noncompliance   OSA (obstructive sleep apnea)    Sleep apnea     Surgical History: Past Surgical History:  Procedure Laterality Date   CARDIAC CATHETERIZATION     CORONARY ANGIOPLASTY     CORONARY STENT INTERVENTION N/A 08/21/2016   Procedure: CORONARY STENT INTERVENTION;  Surgeon: Burnell Blanks, MD;  Location: [facility];  Service: Cardiovascular;  Laterality: N/A;   CYSTOSCOPY N/A 12/04/2019   Procedure: CYSTOSCOPY;  Surgeon: Cleon Gustin, MD;  Location: [facility];  Service: Urology;  Laterality: N/A;   INTRAVASCULAR PRESSURE WIRE/FFR STUDY N/A 08/21/2016   Procedure: INTRAVASCULAR PRESSURE WIRE/FFR STUDY;  Surgeon: Burnell Blanks, MD;  Location: [facility];  Service:  Cardiovascular;  Laterality: N/A;   LIVER BIOPSY     MI WITH STENTS     NASAL SEPTOPLASTY W/ TURBINOPLASTY N/A 01/25/2019   Procedure: BILATERAL TURBINATE REDUCTION;  Surgeon: Leta Baptist, MD;  Location: [facility];  Service: ENT;  Laterality: N/A;   NASAL SINUS SURGERY     RIGHT/LEFT HEART CATH AND CORONARY ANGIOGRAPHY N/A 08/21/2016   Procedure: Right/Left Heart Cath and Coronary Angiography;  Surgeon: Burnell Blanks, MD;  Location: [facility];  Service: Cardiovascular;  Laterality: N/A;   TRANSURETHRAL RESECTION OF PROSTATE N/A 12/04/2019   Procedure: TRANSURETHRAL RESECTION OF THE PROSTATE (TURP);  Surgeon: Cleon Gustin, MD;  Location: [facility];  Service: Urology;  Laterality: N/A;    Home Medications:  Allergies as of 02/04/2022   No Known Allergies      Medication List        Accurate as of February 04, 2022  8:59 AM. If you have any questions, ask your nurse or doctor.          AMBULATORY NON FORMULARY MEDICATION 0.2 mLs by Intracavernosal route as needed. Medication Name: Trimix  PGE 15mcg Pap 30mg  Phent 1mg    aspirin EC 81 MG tablet Take 81 mg by mouth daily.   atorvastatin 80 MG tablet Commonly known as: LIPITOR Take 1 tablet by mouth once daily  BD Pen Needle Nano 2nd Gen 32G X 4 MM Misc Generic drug: Insulin Pen Needle USE AS DIRECTED ONCE DAILY WITH SOLIQUA   carvedilol 25 MG tablet Commonly known as: COREG Take 25 mg by mouth 2 (two) times daily.   Entresto 97-103 MG Generic drug: sacubitril-valsartan Take 1 tablet by mouth twice daily   escitalopram 20 MG tablet Commonly known as: LEXAPRO Take 20 mg by mouth daily.   Gemtesa 75 MG Tabs Generic drug: Vibegron Take 1 capsule by mouth daily.   glipiZIDE 10 MG 24 hr tablet Commonly known as: GLUCOTROL XL Take 10 mg by mouth daily.   lisinopril 20 MG tablet Commonly known as: ZESTRIL Take 20 mg by mouth daily.   mirabegron ER 25 MG Tb24 tablet Commonly known as: MYRBETRIQ Take  1 tablet (25 mg total) by mouth daily.   nitroGLYCERIN 0.4 MG SL tablet Commonly known as: NITROSTAT Place 0.4 mg under the tongue every 5 (five) minutes as needed for chest pain.   Rybelsus 3 MG Tabs Generic drug: Semaglutide Take 3 mg by mouth daily.   Ozempic (0.25 or 0.5 MG/DOSE) 2 MG/1.5ML Sopn Generic drug: Semaglutide(0.25 or 0.5MG /DOS) as directed Subcutaneous weekly for 30 days   Rybelsus 7 MG Tabs Generic drug: Semaglutide Take 1 tablet by mouth every morning.   spironolactone 25 MG tablet Commonly known as: ALDACTONE Take 1 tablet (25 mg total) by mouth daily.   tadalafil 20 MG tablet Commonly known as: CIALIS Take 1 tablet (20 mg total) by mouth as needed.   Toujeo Max SoloStar 300 UNIT/ML Solostar Pen Generic drug: insulin glargine (2 Unit Dial) SMARTSIG:40 Unit(s) SUB-Q Daily   vardenafil 20 MG tablet Commonly known as: LEVITRA Take 1 tablet (20 mg total) by mouth daily as needed for erectile dysfunction.        Allergies: No Known Allergies  Family History: Family History  Problem Relation Age of Onset   Coronary artery disease Other        UNKNOWN   Heart disease Father        CAD s/p CABG   Diabetes Mellitus II Father    Diabetes Mellitus II Mother    Cancer Brother     Social History:  reports that he has never smoked. He has never been exposed to tobacco smoke. He has never used smokeless tobacco. He reports that he does not drink alcohol and does not use drugs.  ROS: All other review of systems were reviewed and are negative except what is noted above in HPI  Physical Exam: BP 128/88   Pulse 81   Constitutional:  Alert and oriented, No acute distress. HEENT: [place] AT, moist mucus membranes.  Trachea midline, no masses. Cardiovascular: No clubbing, cyanosis, or edema. Respiratory: Normal respiratory effort, no increased work of breathing. GI: Abdomen is soft, nontender, nondistended, no abdominal masses GU: No CVA tenderness.  Lymph:  No cervical or inguinal lymphadenopathy. Skin: No rashes, bruises or suspicious lesions. Neurologic: Grossly intact, no focal deficits, moving all 4 extremities. Psychiatric: Normal mood and affect.  Laboratory Data: Lab Results  Component Value Date   WBC 10.2 12/05/2019   HGB 14.2 12/05/2019   HCT 44.4 12/05/2019   MCV 91.5 12/05/2019   PLT 247 12/05/2019    Lab Results  Component Value Date   CREATININE 0.72 12/05/2019    Lab Results  Component Value Date   PSA 0.5 07/12/2019    No results found for: "TESTOSTERONE"  Lab Results  Component Value Date  HGBA1C 7.4 (H) 12/04/2019    Urinalysis    Component Value Date/Time   COLORURINE AMBER (A) 05/19/2019 1600   APPEARANCEUR Clear 07/09/2021 1318   LABSPEC 1.031 (H) 05/19/2019 1600   PHURINE 6.0 05/19/2019 1600   GLUCOSEU 3+ (A) 07/09/2021 1318   HGBUR LARGE (A) 05/19/2019 1600   BILIRUBINUR Negative 07/09/2021 1318   KETONESUR 20 (A) 05/19/2019 1600   PROTEINUR Negative 07/09/2021 1318   PROTEINUR >=300 (A) 05/19/2019 1600   UROBILINOGEN 0.2 09/29/2019 0910   NITRITE Negative 07/09/2021 1318   NITRITE NEGATIVE 05/19/2019 1600   LEUKOCYTESUR Negative 07/09/2021 1318   LEUKOCYTESUR NEGATIVE 05/19/2019 1600    Lab Results  Component Value Date   LABMICR Comment 07/09/2021   WBCUA 0-5 12/04/2020   LABEPIT 0-10 12/04/2020   MUCUS Present (A) 12/04/2020   BACTERIA Few (A) 12/04/2020    Pertinent Imaging:  No results found for this or any previous visit.  No results found for this or any previous visit.  No results found for this or any previous visit.  No results found for this or any previous visit.  No results found for this or any previous visit.  No valid procedures specified. No results found for this or any previous visit.  No results found for this or any previous visit.   Assessment & Plan:    1. Erectile dysfunction due to arterial insufficiency -contineu trimix prn  2. Benign  prostatic hyperplasia with urinary obstruction -vesicare 5mg  daily - Urinalysis, Routine w reflex microscopic - BLADDER SCAN AMB NON-IMAGING  3. OAB (overactive bladder) -continue vesicare 5mg  daily  4. Incomplete bladder emptying -vesicare 5mg  daily   No follow-ups on file.  Nicolette Bang, MD  [facility] [place]

## 2022-02-08 NOTE — Progress Notes (Signed)
PCP:  Adaline Sill, NP Primary Cardiologist: Carlyle Dolly, MD Electrophysiologist: TBD ( Will likely see Quentin Ore with potential for CCM)  [name] Gell. is a 57 y.o. male seen today for EP team for consultation for consideration of BAT.  Previously seen by Dr. Rayann Heman to discuss ICD and BAT.   He presents today for same. He reports NYHA III-IIIb symptoms. SOB with anything more than ADLs. He can shower OK, but is SOB walking short distances on flat ground, for example to the room from the lobby. See [id number] for further. Very limited. SOB and fatigue limit him several times each day.   He has OSA but has been unable to tolerates masks. Last attempted several months ago. Considering Inspire study device. Denies chest pain or syncope.   Past Medical History:  Diagnosis Date   Anxiety    Arthritis    in lower back   Coronary artery disease    a. 02/2008: anterior STEMI s/p DES to prox LAD, b. Myoview 2012: scar in the anterior wall with mild ischemia and EF 33%, c. neg GXT 06/2011  d. 08/2016: cath with DES to diagonal    Depression with anxiety    Diabetes mellitus    TYPE II   Fatty liver    H/O ELEVATED LIVER ENZYMES   High cholesterol    Hyperlipidemia    Hypertension    Ischemic cardiomyopathy    a. Previous EF 33-35% 2012, b. 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 ([place])    Myocardial infarction ([place])    Noncompliance    Prior hx of med noncompliance   OSA (obstructive sleep apnea)    Sleep apnea     Current Outpatient Medications  Medication Instructions   AMBULATORY NON FORMULARY MEDICATION 0.2 mLs, Intracavernosal, As needed, Medication Name: Trimix<BR><BR>PGE 51mcg<BR>Pap 30mg <BR>Phent 1mg    aspirin EC 81 mg, Oral, Daily   atorvastatin (LIPITOR) 80 MG tablet Take 1 tablet by mouth once daily   BD PEN NEEDLE NANO 2ND GEN 32G X 4 MM MISC USE AS DIRECTED ONCE DAILY WITH SOLIQUA   carvedilol (COREG) 25 mg, Oral, 2  times daily   escitalopram (LEXAPRO) 20 mg, Oral, Daily   glipiZIDE (GLUCOTROL XL) 10 mg, Oral, Daily   lisinopril (ZESTRIL) 20 mg, Oral, Daily   nitroGLYCERIN (NITROSTAT) 0.4 mg, Sublingual, Every 5 min PRN   RYBELSUS 7 MG TABS 1 tablet, Oral, Every morning   Rybelsus 3 mg, Oral, Daily   sacubitril-valsartan (ENTRESTO) 97-103 MG Take 1 tablet by mouth twice daily   Semaglutide,0.25 or 0.5MG /DOS, (OZEMPIC, 0.25 OR 0.5 MG/DOSE,) 2 MG/1.5ML SOPN as directed Subcutaneous weekly for 30 days   solifenacin (VESICARE) 5 mg, Oral, Daily   spironolactone (ALDACTONE) 25 mg, Oral, Daily   tadalafil (CIALIS) 20 mg, Oral, As needed   TOUJEO MAX SOLOSTAR 300 UNIT/ML Solostar Pen SMARTSIG:40 Unit(s) SUB-Q Daily   vardenafil (LEVITRA) 20 mg, Oral, Daily PRN    Physical Exam: Vitals:   02/10/22 1020  BP: 118/80  Pulse: 88  SpO2: 95%  Weight: 253 lb 6.4 oz (114.9 kg)  Height: 5\' 10"  (1.778 m)    GEN- NAD. A&O x 3. Normal affect. HEENT: normocephalic, atraumatic Lungs- CTAB, Normal effort Heart- Regular rate and rhythm, No M/G/R Extremities- No peripheral edema. no clubbing or cyanosis; Skin- warm and dry, no rash or lesion  EKG is not ordered today. Recent EKG reviewed.  Additional studies reviewed include: Previous EP  notes.   Assessment and Plan:  1. Chronic systolic CHF This pt has an ICM with most recent EF 25-30% (07/2021) despite GDMT\ He describes NYHA III-IIIb symptoms At this time, he meets MADIT II/ SCD-HeFT criteria for ICD implantation for primary prevention of sudden death.  Given QRS < 130 msec, he does not meet criteria for CRT. He may be candidate for BAT or CCM therapy, pending additional labwork.  We discussed in detail his options moving forward including ICD -> healing period -> BAT consideration   vs BAT consideration and continued personal reflection on whether or not he wants an ICD.  Pt wishes to continue considering ICD. He verbalizes understanding of mortality  benefit  2. OSA Non-compliance with mask due to intolerance. He is considering Inspire device. I have counseled him to fully consider his cardiac devices first, as he will   He does shoot a gun R handed, and would unlikely be able to do so with bilateral devices.    [name] Lathe Jr.'s heart failure has failed to improve despite titration of guideline directed medication such that he qualifies for the [facility] NEO device. The following information from the patient's medical record supports the medical necessity of this procedure for my patient, consistent with the FDA on-label indication for BAROSTIM NEO:  ? LVEF of 25-30%  confirmed by Echo on 07/2021   ? NT-proBNP of <1600 pg/ml  = Labs to be drawn today  ?Symptomatic despite medication management of: beta blocker, ACEi/ARB/ARNi, and Aldosterone inhibitor as evidenced by symptoms below.   ? This patients signs and symptoms of heart failure include "dyspnea with mild to moderate exertion, edema, fatigue, and weakness   ? NYHA Congestive Heart Failure Classification:  III-IIIb  ? Recent hospitalization for Heart Failure on (not applicable for this patient)   This patient is NOT indicated for cardiac resynchronization therapy because QRS = 90 by EKG on 11/06/2021    Follow up pending labwork. If BAT candidate will have see research. If not BAT candidate will have see Dr. Quentin Ore for possible CCM given pts complicated picture and consideration for ICD as well.   Shirley Friar, PA-C  02/10/22 10:38 AM

## 2022-02-09 ENCOUNTER — Telehealth: Payer: Self-pay

## 2022-02-09 NOTE — Telephone Encounter (Signed)
Patient called and wanted to know if he is to stop the [facility], patient was precribe Vesicare at last appt. Made patient aware that I will send a task to the MD and someone will reach back out to him with a response. Patient voiced understanidng.

## 2022-02-10 ENCOUNTER — Ambulatory Visit: Payer: 59 | Attending: Student | Admitting: Student

## 2022-02-10 ENCOUNTER — Encounter: Payer: Self-pay | Admitting: Student

## 2022-02-10 VITALS — BP 118/80 | HR 88 | Ht 70.0 in | Wt 253.4 lb

## 2022-02-10 DIAGNOSIS — I5022 Chronic systolic (congestive) heart failure: Secondary | ICD-10-CM

## 2022-02-10 NOTE — Telephone Encounter (Signed)
Made patient aware that he can stop the [facility] per Dr. Alyson Ingles. Patient voiced understanding

## 2022-02-10 NOTE — Patient Instructions (Signed)
Medication Instructions:  Your physician recommends that you continue on your current medications as directed. Please refer to the Current Medication list given to you today.   *If you need a refill on your cardiac medications before your next appointment, please call your pharmacy*   Lab Work:  TODAY!!!! BMET/PRO BNP  If you have labs (blood work) drawn today and your tests are completely normal, you will receive your results only by: [place] (if you have MyChart) OR A paper copy in the mail If you have any lab test that is abnormal or we need to change your treatment, we will call you to review the results.   Testing/Procedures:  None    Follow-Up: At [facility], you and your health needs are our priority.  As part of our continuing mission to provide you with exceptional heart care, we have created designated Provider Care Teams.  These Care Teams include your primary Cardiologist (physician) and Advanced Practice Providers (APPs -  Physician Assistants and Nurse Practitioners) who all work together to provide you with the care you need, when you need it.  We recommend signing up for the patient portal called "MyChart".  Sign up information is provided on this After Visit Summary.  MyChart is used to connect with patients for Virtual Visits (Telemedicine).  Patients are able to view lab/test results, encounter notes, upcoming appointments, etc.  Non-urgent messages can be sent to your provider as well.   To learn more about what you can do with MyChart, go to [email or web address].    Your next appointment:   As needed according to lab results.

## 2022-02-11 LAB — BASIC METABOLIC PANEL
BUN/Creatinine Ratio: 14 (ref 9–20)
BUN: 12 mg/dL (ref 6–24)
CO2: 22 mmol/L (ref 20–29)
Calcium: 9.7 mg/dL (ref 8.7–10.2)
Chloride: 103 mmol/L (ref 96–106)
Creatinine, Ser: 0.85 mg/dL (ref 0.76–1.27)
Glucose: 326 mg/dL — ABNORMAL HIGH (ref 70–99)
Potassium: 4.7 mmol/L (ref 3.5–5.2)
Sodium: 137 mmol/L (ref 134–144)
eGFR: 102 mL/min/{1.73_m2} (ref >59)

## 2022-02-11 LAB — PRO B NATRIURETIC PEPTIDE: NT-Pro BNP: 148 pg/mL (ref 0–210)

## 2022-03-09 ENCOUNTER — Encounter: Payer: Self-pay | Admitting: *Deleted

## 2022-03-10 ENCOUNTER — Other Ambulatory Visit: Payer: Self-pay | Admitting: *Deleted

## 2022-03-10 ENCOUNTER — Ambulatory Visit (HOSPITAL_COMMUNITY)
Admission: RE | Admit: 2022-03-10 | Discharge: 2022-03-10 | Disposition: A | Discharge status: Home / Self Care | Payer: Self-pay | Source: Ambulatory Visit | Attending: Cardiology | Admitting: Cardiology

## 2022-03-10 ENCOUNTER — Ambulatory Visit (HOSPITAL_BASED_OUTPATIENT_CLINIC_OR_DEPARTMENT_OTHER)
Admission: RE | Admit: 2022-03-10 | Discharge: 2022-03-10 | Disposition: A | Discharge status: Home / Self Care | Payer: Self-pay | Source: Ambulatory Visit | Attending: Cardiology | Admitting: Cardiology

## 2022-03-10 ENCOUNTER — Encounter: Payer: 59 | Admitting: *Deleted

## 2022-03-10 VITALS — BP 121/84 | HR 74 | Temp 98.1°F | Resp 18 | Ht 70.0 in

## 2022-03-10 DIAGNOSIS — Z006 Encounter for examination for normal comparison and control in clinical research program: Secondary | ICD-10-CM

## 2022-03-10 NOTE — Research (Addendum)
Section A:  Administrative Section  Subject ID: _[id number]_ - _034_ - 015 Subject Initials: _R_ _L_ _C_  Date subject signed informed consent  _20_/_Feb__/_2024_      (DD /  MMM  / YYYY)  Has the subject been previously screened?    [x]   No     []    Yes                                                                                  Previous Subject ID   __ __ __ __ - __ __ __ - 015       Has the subject been randomized in the BeAT-HF Trial?    [x]   No     []    Yes  Previous Subject ID   __ __ __ __ - __ __ __ - 013  Section B:  Enrollment Criteria  In the investigator's opinion does the subject meet the FDA Indication for Use:  [x]  Yes    []  No (STOP - subject does not qualify for the study)          Has the subject received cardiac resynchronization therapy (CRT) within six months of enrollment, or is actively receiving CRT? []  Yes (STOP - subject does not qualify for the study)   [x]  No  Section C:  Serum Biomarkers   NT-proBNP: ___220__ Date:    _20_/_FEB__/_2024______                  (DD / MMM / YYYY)  eGFR:  _103__ mL/min/1.23m2 Date:    _20_/_FEB_/_2024_                  (DD / MMM / Annamarie Major)  Negative Pregnancy Test? [x]  N/A Date:    _____/_______/_______                  (DD / MMM / Annamarie Major)   []  Yes     []  No   Section D:  Appropriate Surgical/Study Candidate:   Is the subject able to discontinue the use of antiplatelet drugs (e.g. aspirin) in advance of the procedure, if required? [x]  Yes   []  No  Assessed by:  Durene Cal, MD__ Implanting Physician [x]  Yes Date:    _20_/_FEB_/_2024_                 (DD / MMM / Annamarie Major)   []  No   Assessed by:   Steffanie Dunn, MD__ Vascular / Other Surgeon [x]  Yes Date:    __05_/_Mar_/_2024______                 (DD / MMM / Annamarie Major)     []  No   Section E:  Inclusion/Exclusion Criteria  Does the subject meet all Inclusion Criteria? [x]  Yes   []  No (STOP - subject does not qualify for the study)   Check all that apply   []  Age at  least 21 years and no more than 80 years at the time of enrollment.   []  Appropriate candidate for the surgery as determined by an evaluation from the implanting physician using a carotid duplex ultrasound (CDU) [or Computed Tomography Angiography (CTA) if  CDU inconclusive or incomplete] and a review of medical history (including existence of infections that may increase implant risk).  Evaluation must confirm the following within 45 days of the BAROSTIM NEO implant: Appropriate medical condition and medical history for implantation of the BAROSTIM NEO System AND Anatomy that enables this implant procedure, with no vascular structures or orientations or neck anomalies that would be obstructive to the implantation path AND The artery planned for the BAROSTIM NEO implant must have: A carotid bifurcation below the level of the mandible AND No ulcerative carotid arterial plaques AND No carotid atherosclerosis producing a 30% or greater stenosis in linear diameter in the internal carotid AND No carotid atherosclerosis producing a 30% or greater stenosis in linear diameter in the distal common carotid AND Have had no prior surgery, radiation, or endovascular stent placement in the carotid artery or the carotid sinus region AND Able to discontinue the use of antiplatelet drugs (e.g. aspirin) in advance of the procedure, if required   []  Six-minute hall walk ( ) >= 150 m AND <= 400 m within 45 days prior to implant.   []  Serum estimated glomerular filtration rate (eGFR) >= 25 mL/min/1.73 m2 using the CKD-EPI method within 45 days prior to the [facility] NEO implant.   []  Body mass index <= 40 kg/m2 within 45 days prior to the [facility] NEO implant.   []  If male and of childbearing potential, must use a medically accepted method of birth control (e.g., barrier method with spermicide, oral contraceptive, or abstinence) and agree to continue use of this method for the duration of the study. Women of  childbearing potential must have a negative pregnancy test within 14 days prior to the [facility] NEO implant.   []  Subjects implanted with a cardiac rhythm management device that does not utilize an intracardiac lead, or implanted with a neurostimulation device, must be approved by the [facility] Clinical department.   []  At the end of screening/baseline, subject still meets the Barostim Neo Indication for Use   []  Signed a [facility]-approved informed consent form for participation in this study.      Does the subject meet any Exclusion Criteria? []  Yes (STOP - subject does not qualify for the study)   [x]  No     List criteria # met: __________________________   []  Received cardiac resynchronization therapy (CRT) within six months of enrollment or is actively receiving CRT.   []  Any of the following contraindications: Baroreflex failure or autonomic neuropathy  Uncontrolled, symptomatic cardiac bradyarrhythmias Known allergy to silicone or titanium   []  Unstable ventricular arrhythmias.   []  Presence of baseline cranial nerve dysfunction at risk from cervical interventions on the carotid bifurcation determined by the Ear, Nose and Throat (ENT) examination.   []  Subjects with any surgery that has occurred, or is planned to occur, within 45 days of the [facility] NEO implant.   []  Recent history (within 6 months of implant) of significant and uncontrolled bleeding.   []  Known and untreated hypercoagulability state.   []  An inappropriate study candidate as evidenced by: Solid organ or hematologic transplant, or currently being evaluated for an organ transplant. Has received or is receiving LVAD therapy or chronic dialysis. Current or planned treatment with intravenous positive inotrope therapy. Primary pulmonary hypertension. Severe COPD or severe restrictive lung disease (e.g. requires chronic oral steroid use or home oxygen use).  Heart failure secondary to a reversible cause, such as cardiac structural  valvular disease, acute myocarditis and pericardial constriction. Clinically significant cardiac structural  valvular disease.  Unable or unwilling to fulfill the protocol medication compliance and follow-up requirements, for reasons including but not limited to an unresolved history of alcohol or substance abuse or psychiatric disorder. Active malignancy.  Any other serious medical condition that may adversely affect the safety of the participant or validity of the study, in the opinion of the investigator. Life expectancy less than one year.   []  Any of the following within 3 months prior to the [facility] NEO implant. Myocardial infarction Unstable angina Coronary intervention (e.g. CABG or PTCA)  Cerebral vascular accident or transient ischemic attack Sudden cardiac death  Surgical cardiac intervention (e.g. cardiac ablation, valve replacement)   []  Enrolled and active in another (e.g. device, pharmaceutical, or biological) clinical study unless approved by the [facility] Clinical department.  Section F:  Adverse Events  Were there any Adverse Events that occurred since consent? []  Yes (complete AE form)   [x]  No  Section H:  Medication Changes   Have there been any changes to the subject's home use medications for Arrhythmia, Antiplatelet/Anticoagulation and Heart failure medications during screening and baseline? []  Yes (update Med form)   [x]  No  Section I:  Signature   Person completing form (Print Name): Mercer Pod :) __________________  Signature: Mercer Pod :) ___ Date: __03/08/2024________________________     Section A:  Administrative Section  Subject ID: _[id number]_ - _034_ - 015 Subject Initials: _R_ _L_ _C_  Visit Interval:    [x]   Screening/Baseline    []  Activation   []  0.5 Month       []  1 Month     []  2 Month     []  3 Month       []  6 Month     []  12 Month         []  Unscheduled, reason for visit: _________________________________________  Section B:   Physical Assessment   Date:    _20_/_FEB_/_2024__   (DD / MMM / YYYY)  Weight: __251.4_  []  kg    [x]  pounds Height (Screening Visit Only): __177_  [x]  cm []  inches  Blood Pressure: _121_  / _84_mmHg Heart Rate: __74__ bpm  Section E:  Signature    Person completing form (Print Name): Mercer Pod :) _____  Signature: Mercer Pod :) ____ Date: _02/20/2024_________________________     SECTION A:  Administrative Section  Patient ID: _[id number]_ - _034__ - 015 Patient Initials: _R_ _L_ _C_  Visit Interval:    [x]   Screening/Baseline*    []  Activation   []  0.5 Month       []  1 Month     []  2 Month     []  3 Month       []  6 Month     []  12 Month         []  Unscheduled, reason for visit: _________________________________________  * For Screening / Baseline only the questions are based on 30 days prior to consent.  SECTION B:  COVID-19 Like Illness Symptoms   Has the subject experienced any cold, flu or COVID-19 symptoms since the last study visit? [x]   No  (skip to section C)     []  Yes, date of onset:  _____/_____ MMM/YYYY    If yes, check all symptoms that apply:   []  Fevers or chills   []   New or Worsening Cough  []  Productive  []  Dry     If yes to cough, indicate severity  []  Constant  []  Occasional, several per hour   []   New or worsened shortness of breath   []  Diarrhea   []  Altered or reduced sense of smell or taste   []  Muscle aches/Severe Fatigue   []  Chest pain or tightness   []  Sore throat   []  Nausea or vomiting  SECTION C:  COVID-19 Like Illness Testing   Has the patient been tested for COVID-19 since the last study visit? [x]  No     []  Yes, date of test:  _____/_____ MMM/YYYY    If yes, test results:   []  Positive []  Negative []  Unknown  Has the patient been tested for COVID-19 Antibodies since the last study visit? [x]  No     []  Yes, date of test:  _____/_____ MMM/YYYY    If yes, test results:   []  Positive []  Negative []  Unknown  Has the patient  been vaccinated for COVID-19 since the last study visit? [x]  No     []  Yes, date of test:  _____/_____ MMM/YYYY  Has the patient been tested for Influenza ("flu") since the last study visit? [x]  No     []  Yes, date of test:  _____/_____ MMM/YYYY    If yes, test results:   []  Positive []  Negative []  Unknown  Has the patient been vaccinated for Influenza ("flu") since the last study visit? [x]  No     []  Yes, date of test:  _____/_____ MMM/YYYY  SECTION D:  COVID-19 Like Illness Exposure  Has the subject been told that they might have had COVID-19/have symptoms suggestive of COVID-19 since the last study visit? [x]    No   []  Yes   []   Unknown  Has the subject been exposed to anyone with known or suspected COVID-19 since the last study visit? [x]    No   []  Yes   []   Unknown  Has the subject been told that they might have had the "flu" or influenza since the last study visit? [x]    No   []  Yes   []   Unknown  SECTION E:  Effects of COVID Pandemic on Subject's Healthcare Interactions  Since the last study visit, did the subject feel the need to go to an emergency department or hospital for their heart failure but decided not to because of concerns about COVID-19? [x]   No   []  Yes   []  Unknown    If yes, how did they seek care (check all that apply):   []  Telemedicine visit []  In-person []  Clinic []  Urgent Care   []  Subject did not change hospital/ER use due to COVID-19 []  Other, specify: ________________________  Since the last study visit, did the subject have a cardiology/HF related appointment cancelled/rescheduled due to COVID-19 pandemic? [x]   No   []  Yes, how many? ___________   []  Unknown  Since the last study visit, did the subject have any cardiology/HF related telemedicine visit due to COVID-19 pandemic? [x]   No   []  Yes, how many? ___________   []  Unknown  Since the last study visit, did the subject have a cardiology/HF procedure cancelled/rescheduled due to COVID-19 pandemic?  [x]   No   []  Yes, how many? ___________   []  Unknown  SECTION F:  Effects of COVID Pandemic on Subject's Medications  Since the last visit, did any of their heart failure medications change or stop, even for a short time?   If yes, enter change into medication eCRF [x]   No   []  Yes, how many? ___________   []  Unknown    If yes,  why:   []  Instructed by Doctor []  Self-discontinued []  Unknown []  Other: ________________  Since the last visit, was the subject prescribed any medications for COVID-19? [x]   No   []  Yes   []  Unknown    If yes, what medications (generic name):  SECTION G:  Effects of COVID Pandemic on Subject's Lifestyle  How has the subject's activity/exercise level changed due to COVID-19 pandemic? [x]   No change   []  More activity/exercise   []  Less activity/exercise  How has the subject's smoking habits changed due to COVID-19? [x]   Does not smoke   []  No change   []  Smoke more   []  Smoke less  How has the subject's alcohol drinking habits changed due to COVID-19? [x]   Does not drink   []  No change   []  Drink more   []  Drink less  Section H:  Signature  Person completing form (Print Name): __Kimberly Shavy Beachem :) ___________________  Signature: Mercer Pod :) ______ Date: _02/20/2024_________________________     Section A:  Administrative Section   Subject ID: _[id number]_ - __034_ - 015  Subject Initials: __R_ __L_ _C__  Section B:  Carotid Duplex Ultrasound (CDU) [x]  Done []  Not Done  Date: _20_/_Feb_/_2024_ (DD / MMM / YYYY)  [x]  Images sent to [facility]  % atherosclerosis in the internal and distal common carotid and duplex measurements:  Right Side Measurements    Both internal and distal common carotids are less than 30% [x]  Yes  []  No (cannot be used for implant)   Comments on determination of <30% stenosis:  n/a  Internal Carotid (ICA) []   Normal (no plaque) PSV (peak systolic velocity) __56___ cm/sec   [x]   <=15% (*see CTA) EDV (end diastolic velocity)  __26___ cm/sec   []   16-49% (*see CTA) Linear diameter __49___ mm   []   50-69% Normal spectral waveform  [x]  Yes   []   >=70%  []  No, specify pattern: __________________  Distal Common Carotid [facility]) []   Normal (no plaque) PSV (peak systolic velocity) __83__ cm/sec   [x]   <=15% (*see CTA) EDV (end diastolic velocity) __22__ cm/sec   []   16-49% (*see CTA) Linear diameter __80_______ mm   []   50-69% Normal spectral waveform  [x]  Yes   []   >=70%  []  No, specify pattern: __________________  Left Side Measurements   Both internal and distal common carotids are less than 30% [x]  Yes  []  No (cannot be used for implant)   Comments on determination of <30% stenosis:  n/a  Internal Carotid (ICA)  []   Stenosis Normal (no plaque) PSV (peak systolic velocity) __63__ cm/sec   [x]   <=15% (*see CTA) EDV (end diastolic velocity) __29__ cm/sec   []   16-49% (*see CTA) Linear diameter __40__ mm   []   50-69%  Normal spectral waveform  [x]  Yes   []   >=70%   []  No, specify pattern: __________________  Distal Common Carotid [facility])  []   Stenosis Normal (no plaque) PSV (peak systolic velocity) __83__ cm/sec   [x]   <=15% (*see CTA) EDV (end diastolic velocity) __29__ cm/sec   []   16-49% (*see CTA) Linear diameter __77__ mm   []   50-69%  Normal spectral waveform  [x]  Yes   []   >=70%   []  No, specify pattern: __________________  Name of person reading CDU:  Heath Lark, MD   Section C:  Computed Tomography Angiogram (CTA) (if required) [x]  Done []  Not Done  []  CDU was inconclusive (e.g. *stenosis greater than 0%) Date: _07_/_mar_/_2024__  (  DD / MMM / Annamarie Major)  [x]  Images sent to [facility]  []  CDU was incomplete/not done    [x]  CTA requested by [facility]    % atherosclerosis Internal Carotid (ICA) Distal Common Carotid [facility])  Right Side ___0____% __0_____%  Left Side ____0___% ___0____%  Name of person reading CTA:  Durene Cal MD    Section D:  Physician Assessment  Are there any vascular structures or orientations  or neck anomalies that would be obstructive to the implantation path? [x]  No []  Yes (Specify side):     []  Left  []  Right []  Both  Has the subject had prior surgery, radiation, or endovascular stent placement in the carotid artery or the carotid sinus region? [x]  No []  Yes (Specify side):     []  Left  []  Right []  Both  Is at least one carotid bifurcation below the level of the mandible? []  No  [x]  Yes (Specify side):     []  Left  []  Right [x]  Both  Are there any ulcerative carotid arterial plaques? [x]  No  []  Yes (Specify side):     []  Left  []  Right []  Both  Is this subject a candidate for the BATwire procedure? []  No  [x]  Yes (Specify side):     []  Left  []  Right [x]  Both  Section E:  Comments  NA        Section F:  Signature  Person completing form (Print Name): Mercer Pod :) ________________  Signature: Mercer Pod :) ____ Date: __02/24/2024__________________   Section A:  Administrative Section  Subject ID: _[id number]_ - _034_ - 015 Subject Initials: _R_ _L_ _C_ Visit Interval:  [x]   Baseline     []  6 Month     []  12 Month  Section B:  Cardiac Echo   Cardiac Echo Date:  _20_/_Feb_/_2024_ (DD / MMM / Annamarie Major)  [x]  Images sent to [facility]  Heart rate __72___ bpm Echo type: [x]  2D Images Captured: (4-Chamber and Biplane required) [x]  PS-LAX  Blood Pressure _142_/_81_ mmHg  []  3D  [x]  Biplane        [x]  Apical 4-Chamber        []  Other, specify:_______   LV End Diastolic Dimension ___58___ mm   LV End Systolic Dimension ___52___ mm   LA Volume ___28___ mL/m2   LV End-Diastolic Volume ___203___ mL   LV End-Systolic Volume ___141___ mL   LV Ejection Fraction ___30__ %  Section C:  Comments  N/A      Section C:  Signature   Person completing form (Print Name): Mercer Pod :) ________________  Signature: Mercer Pod :) _____ Date: __0226/2024____________   Section A:  Administrative Section  Subject ID: _[id number]_ - _034_ - 015 Subject Initials: _R_ _L_  _C_ Visit Interval:  [x]   Baseline     []  6 Month  Section B:  NYHA   NYHA Classification Date:    _20_/_FEB_/_2024______  (DD / MMM / YYYY)  Select One Class Subject Symptoms  []  I No limitations of physical activity, No undue fatigue, palpitation or dyspnea  []  II Slight limitation of physical activity, Comfortable at rest, Less than ordinary activity results in fatigue, Palpitation, or dyspnea  [x]  III Marked limitation of physical activity, Comfortable at rest, Less than ordinary activity results in fatigue, palpitation, or dyspnea  []  IV Unable to carry out any physical activity without discomfort, Symptoms of cardiac insufficiency at rest, Physical activity causes increased discomfort  Name of person conducting NYHA: Mercer Pod :)  ____________________  Section C:    Six Minute Hall Walk Test Date:    _20_/_FEB_/_2024_  (DD / MMM / Annamarie Major)  Distance Walked __304__________ meters  Were there any devices used to assist the subject in walking (e.g. cane, walker, etc.)? [x]  No   []  Yes specify:_______________________  Was the walk terminated before 6 minutes? [x]  No   []  Yes specify reason: (select all that apply)    []  Angina    []   Dyspnea    []   Fatigue    []   Dizziness    []   Syncope    []   Other, specify:  Name of person conducting 6-Minute Hall Walk: __Kimberly Ivory Broad :) _____________  Section D:  Signature   Person completing form (Print Name): Mercer Pod :) ___________________  Signature: Mercer Pod :) ____ Date: _02/24/2024_________________________      Section A:  Demographic Section  Subject ID: _[id number]_ - _034_ - 015 Subject Initials: _R_ _L_ _C_  Date of Birth: _[birth year]_/_[birth year]_/_1967____      (DD / MMM / Annamarie Major) Gender: [x] Male    []  Male  Ethnicity  [] Asian    [] Black/African Descent [] [place]        [x] White/Caucasian     [] Other/Refused   Section B:  Medical History  Type of Cardiomyopathy [x] Ischemic [] Non-ischemic [] Other,  specify: ___________    Yes No Unknown  Cardiovascular   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 []       If no, date LV lead turned off________/________ (MMM / YYYY)   PA pressure device (e.g. CardioMEMS) Specify Device: ____________ Implant Date: ________/________ (MMM / Annamarie Major) [x]  []    Myocardial Infarction [x]  []  []    CABG []  [x]  []    Coronary Intervention [x]  []  []    Aortic Valve Disease []  [x]  []    Mitral Valve Disease []  [x]  []    Left Ventricular Hypertrophy []  [x]  []   Cardiac Arrhythmia Atrial fibrillation []  [x]  []    Ventricular Tachycardia/Fibrillation []  [x]  []    PVC []  [x]  []    Pacemaker []  [x]  []    ICD []  [x]  []    Ablation Procedure Specify: [] atrial []  ventricular Last procedure date:         ________/________ (MMM / Annamarie Major) [x]  []    Cardioversion Procedure Specify: [] Shock []  Meds Last procedure date:         ________/________ (MMM / Annamarie Major) [x]  []    Sudden Cardiac Death []  [x]  []     Yes No Unknown  Peripheral Vascular Peripheral Vascular Disease []  [x]  []    PVD Intervention (e.g. stent, angioplasty, etc.). []  [x]  []   Cerebrovascular / Neurological Stroke []  [x]  []    TIA []  [x]  []   Renal / Hepatic Prior Renal Denervation Date of Denervation:        ________/________ (MMM / Annamarie Major) [x]  []    Chronic Kidney Disease Stage (1-5): ________   [x]  []    Chronic Dialysis []  [x]  []    Kidney Transplant []  [x]  []   Metabolic / Nutritional Diabetes Mellitus  []   Type 1  [x]   Type 2 []  []    Hypokalemia []  [x]  []   Other, specify  Section C:  Comments  N/A    Section D:  Signature  Person completing form (Print Name): Mercer Pod  ______________________  Signature: Mercer Pod :) ___ Date: __02/26/2024________________________

## 2022-03-10 NOTE — Progress Notes (Signed)
Orders placed.

## 2022-03-10 NOTE — Research (Signed)
[facility] Informed Consent   Subject Name: [name] [name].  Subject met inclusion and exclusion criteria.  The informed consent form, study requirements and expectations were reviewed with the subject and questions and concerns were addressed prior to the signing of the consent form.  The subject verbalized understanding of the trial requirements.  The subject agreed to participate in the [place] trial and signed the informed consent on 20/FEB/2024.  The informed consent was obtained prior to performance of any protocol-specific procedures for the subject.  A copy of the signed informed consent was given to the subject and a copy was placed in the subject's medical record.   Philemon Kingdom D

## 2022-03-11 LAB — BASIC METABOLIC PANEL
BUN/Creatinine Ratio: 16 (ref 9–20)
BUN: 13 mg/dL (ref 6–24)
CO2: 20 mmol/L (ref 20–29)
Calcium: 9.9 mg/dL (ref 8.7–10.2)
Chloride: 102 mmol/L (ref 96–106)
Creatinine, Ser: 0.82 mg/dL (ref 0.76–1.27)
Glucose: 254 mg/dL — ABNORMAL HIGH (ref 70–99)
Potassium: 4.7 mmol/L (ref 3.5–5.2)
Sodium: 137 mmol/L (ref 134–144)
eGFR: 103 mL/min/{1.73_m2} (ref >59)

## 2022-03-11 LAB — PRO B NATRIURETIC PEPTIDE: NT-Pro BNP: 220 pg/mL — ABNORMAL HIGH (ref 0–210)

## 2022-03-11 NOTE — Progress Notes (Signed)
Batwire screening patient

## 2022-03-12 LAB — ECHOCARDIOGRAM COMPLETE
Area-P 1/2: 3.99 cm2
Calc EF: 33.3 %
Height: 70 in
S' Lateral: 5.2 cm
Single Plane A2C EF: 36.3 %
Single Plane A4C EF: 30.5 %

## 2022-03-12 NOTE — Progress Notes (Signed)
Batwire 034 screening please review and sign

## 2022-03-13 ENCOUNTER — Other Ambulatory Visit: Payer: Self-pay | Admitting: *Deleted

## 2022-03-13 DIAGNOSIS — Z006 Encounter for examination for normal comparison and control in clinical research program: Secondary | ICD-10-CM

## 2022-03-25 ENCOUNTER — Telehealth: Payer: Self-pay | Admitting: Pulmonary Disease

## 2022-03-25 NOTE — Telephone Encounter (Signed)
Spoke with patient. Advised I wasn't sure who tired to call no phone or indication we needed to speak with him. He advised he wasn't sure either. Will close encounter for now

## 2022-03-26 ENCOUNTER — Ambulatory Visit (HOSPITAL_COMMUNITY)
Admission: RE | Admit: 2022-03-26 | Discharge: 2022-03-26 | Disposition: A | Discharge status: Home / Self Care | Payer: 59 | Source: Ambulatory Visit | Attending: Surgery | Admitting: Surgery

## 2022-03-26 DIAGNOSIS — Z006 Encounter for examination for normal comparison and control in clinical research program: Secondary | ICD-10-CM | POA: Insufficient documentation

## 2022-03-26 MED ORDER — IOHEXOL 350 MG/ML SOLN
75.0000 mL | Freq: Once | INTRAVENOUS | Status: AC | PRN
  Administered 2022-03-26: 75 mL via INTRAVENOUS

## 2022-03-27 ENCOUNTER — Encounter: Payer: 59 | Admitting: *Deleted

## 2022-03-27 ENCOUNTER — Ambulatory Visit (HOSPITAL_COMMUNITY): Payer: 59

## 2022-03-27 ENCOUNTER — Encounter (INDEPENDENT_AMBULATORY_CARE_PROVIDER_SITE_OTHER): Payer: Self-pay | Admitting: Otolaryngology

## 2022-03-27 DIAGNOSIS — Z006 Encounter for examination for normal comparison and control in clinical research program: Secondary | ICD-10-CM

## 2022-03-27 NOTE — Progress Notes (Signed)
Batwire 034  Please review and document near normal, </=15%; 16-49 stenosis  Thanks

## 2022-03-27 NOTE — Progress Notes (Addendum)
Section A:  Administrative Section  Subject ID: _[id number] - __[id number]___ - 015 Subject Initials: __rlc_ ___ ___ Visit Interval:  (* = as required) [x]   Baseline     []  Implant/Pre D/C  Date of Report:   _08____/_mar______/_2024______       (DD / MMM / Annamarie Major)  []   1 Month     []  3 Month*  Name of ENT ___John Jearld Fenton, MD_________  []   6 Month*     []  12 Month*     []  Unscheduled*  Directions:  Baseline: complete sections B-E only   All other visits, complete all sections below (B-G)   Use comments box to document any abnormalities or changes from Baseline  Was the CVRx device turned off (required at pre-discharge & 1 month)? []  Yes   []  No, specify reason why not: __________________________________  Was a laryngoscope used (required at pre-discharge)? [x]  Yes   []  No  Section B:  Dysphonia   Global Rating of Dysphonia: (Absence of recent upper respiratory infection or recent extensive voice abuse)   [x]   Normal (or clear)   []   Mild   []   Mild to Moderate   []   Moderate   []   Moderate to Severe   []   Severe   []   Aphonic  Section C:  Tongue/Pharynx   Assessment Observation Severity (complete only if abnormal); defined as:    Mild Minimal impairment of functioning; patient is able to carry out usual activities.    Moderate Patient experiences sufficient discomfort to interfere with or reduce their usual level of activity.    Severe Clinically evident impairment of functioning; patient is unable to carry out usual activities.  Pharynx at Rest [x]   Symmetric []   Mild   []  Asymmetric []  Moderate     []  Severe  Pharynx with Phonation ("ah") [x]   Symmetric []   Mild   []  Asymmetric []  Moderate     []  Severe  Tongue Atrophy [x]   Not Present []   Mild   []  Present []  Moderate     []  Severe  Tongue Mobility [x]   Normal []   Mild   []  Reduced []  Moderate     []  Severe  Tongue Protrusion [x]   Midline []   Mild Side: ____________   []  Deviation []  Moderate      []  Severe   Sensation of the Pharynx  [x]   Normal []   Mild   []  Decreased []  Moderate     []  Severe  Section D:  House-Brackman Facial Paralysis Scale (Circle One)  Grade Impairment  I  [x]  Normal  II Mild Dysfunction (slight weakness, normal symmetry at rest)  III Moderate Dysfunction (obvious but not disfiguring weakness with synkinesis, normal symmetry at rest); Complete eye closure with maximal effort; Good forehead movement  IV Moderately severe dysfunction (obvious and disfiguring asymmetry, significant synkinesis); Incomplete eye closure; moderate forehead movement  V Severe dysfunction (barely perceptible motion  VI Total paralysis (no movement)  Section E:  Baseline Cranial Nerve Assessment (for comparison to pre D/C assessment)  Cranial Nerve V (Trigeminal Nerve) Ask subject to open mouth.  Any deviation to the left or right? Ask subject to move jaw side to side.  Does subject have difficulty moving jaw to either side?  Ask subject to clench their teeth.  Does the subject feel any weakness on either side?  []  Yes to any, specify:   ____________________________  [x]  No to all  Marginal mandibular branch of cranial nerve VII (Facial Nerve)  Ask the subject to sit and look straight ahead.  Is there any eye droop, tremors, tics, lip corner droop, drooling, asymmetry or expressionless face?  Ask the subject to show their teeth while frowning.  Are there deviations to one side? '[]'$  Yes to any, specify:   ____________________________  '[x]'$  No to all  Cranial nerve IX (Glossopharyngeal Nerve) and Cranial nerve X (Vagus Nerve) Ask subject to say "ah".  Did the soft palate fail to elevate or was there a deviation of the uvula to the right or left?  Ask the subject to say "ahhhh" as long possible.  Did the subject have any abnormal voicing, hoarseness, weakness or coughing? '[]'$  Yes to any, specify:   ____________________________  '[x]'$  No to all  Cranial nerve XI (Accessory Nerve) Ask the subject to maintain a turned head  position against your hand while you try to push the head back to the midline.  Did the subject have any weakness? Ask the subject to push their head forward against your hand while you push the head backwards.  Was the subject unable to push their head forward? Ask the subject to shrug their shoulders.  Was the subject unable to do so in a symmetric manner? '[]'$  Yes to any, specify:   ____________________________  '[x]'$  No to all  Cranial nerve XII (Hypoglossal Nerve) Ask the subject to stick out their tongue.  Was the subject unable to protrude their tongue past their lips, or was there any deviation? Ask the subject to move their tongue from side to side.  Was there any inability to move to the left or right? Ask the subject to push their tongue against a tongue depressor.  Was there any weakness? '[]'$  Yes to any, specify:   ____________________________  '[x]'$  No to all  Other observed cranial nerve dysfunction '[]'$  Yes, specify:  __________________________  '[x]'$  No  Section F:  Baseline Cranial Nerve Damage  Does the subject have a presence of baseline cranial nerve dysfunction '[]'$  Yes, specify what dysfunction: _____________________________________   '[x]'$  No  Section G:  ENT Notes (Pre-D/C and Follow-Up)  Were changes in Dysphonia from baseline or any abnormalities related to the [facility] Implant? '[]'$   Yes   '[]'$  No   '[]'$  Unknown   '[]'$  Not Applicable (no changes from baseline)  Were changes in Tongue/Pharynx from baseline or any abnormalities related to the [facility] Implant? '[]'$   Yes   '[]'$  No   '[]'$  Unknown   '[]'$  Not Applicable (no changes from baseline)  Were changes in House-Brackman Facial Paralysis Scale from baseline or any abnormalities related to the [facility] Implant? '[]'$   Yes   '[]'$  No   '[]'$  Unknown   '[]'$  Not Applicable (no changes from baseline)  Were any of the above changes from baseline potentially related to the intubation for the procedure? '[]'$   Yes   '[]'$  No   '[]'$  Unknown   '[]'$  Not Applicable  (no changes from baseline)  Section H:  Cranial Nerves  Has there been new cranial nerve dysfunction since Baseline?   Cranial nerve V  (Trigeminal Nerve) '[]'$  Yes, specify what changed: _________________________________   '[]'$  No  Marginal mandibular branch of cranial nerve VII  (Facial Nerve) '[]'$  Yes, specify what changed: _________________________________   '[]'$  No  Cranial nerve IX (Glossopharyngeal Nerve) '[]'$  Yes, specify what changed: _________________________________   '[]'$  No  Cranial nerve X  (Vagus Nerve) '[]'$  Yes, specify what changed: _________________________________   '[]'$  No  Cranial nerve XI  (Accessory  Nerve) []  Yes, specify what changed: _________________________________   []  No  Cranial nerve XII  (Hypoglossal Nerve) []  Yes, specify what changed: _________________________________   []  No  Section I:  Comments/Notes/Describe any abnormalities/deficiencies (as well if post-operative change)  N/a          Section J:  Signature   Signature of ENT assessing subject: ____John Jearld Fenton, MD_____  ___08/mar/2024_________________ (Date)

## 2022-03-27 NOTE — Research (Signed)
Patient came to see ENT Dr Janace Hoard see his note in epic.

## 2022-03-30 ENCOUNTER — Ambulatory Visit (HOSPITAL_COMMUNITY): Payer: 59

## 2022-04-06 ENCOUNTER — Encounter: Payer: Self-pay | Admitting: *Deleted

## 2022-04-06 DIAGNOSIS — Z006 Encounter for examination for normal comparison and control in clinical research program: Secondary | ICD-10-CM

## 2022-04-06 NOTE — Research (Signed)
Surgical Instructions    Your procedure is scheduled on May 01, 2022.  Report to [facility] Main Entrance "A" at 0530 A.M., then check in with the Admitting office.  Call this number if you have problems the morning of surgery:  [phone]   Remember:  Do not eat after midnight the night before your surgery     Do not take meds morning of procedure   As of today, STOP taking any Aleve, Naproxen, Ibuprofen, Motrin, Advil, Goody's, BC's, all herbal medications, fish oil, and all vitamins.          Do not wear jewelry or makeup Do not wear lotions, powders, perfumes/colognes, or deodorant. Do not shave 48 hours prior to surgery.  Men may shave face and neck. Do not bring valuables to the hospital.             [facility] is not responsible for any belongings or valuables.  Do NOT Smoke (Tobacco/Vaping)  24 hours prior to your procedure If you use a CPAP at night, you may bring all equipment for your overnight stay.   Contacts, glasses, dentures or bridgework may not be worn into surgery, please bring cases for these belongings   For patients admitted to the hospital, discharge time will be determined by your treatment team.  You will stay one night.   Special instructions:    Oral Hygiene is also important to reduce your risk of infection.  Remember - BRUSH YOUR TEETH THE MORNING OF SURGERY WITH YOUR REGULAR TOOTHPASTE  Day of Surgery:  Wear Clean/Comfortable clothing the morning of surgery Do not apply any deodorants/lotions.   Remember to brush your teeth WITH YOUR REGULAR TOOTHPASTE.

## 2022-04-22 ENCOUNTER — Ambulatory Visit: Payer: 59 | Attending: Cardiology | Admitting: Cardiology

## 2022-04-22 ENCOUNTER — Encounter: Payer: Self-pay | Admitting: Cardiology

## 2022-04-22 VITALS — BP 120/82 | HR 82 | Ht 70.0 in | Wt 250.6 lb

## 2022-04-22 DIAGNOSIS — I255 Ischemic cardiomyopathy: Secondary | ICD-10-CM

## 2022-04-22 DIAGNOSIS — E782 Mixed hyperlipidemia: Secondary | ICD-10-CM | POA: Diagnosis not present

## 2022-04-22 DIAGNOSIS — I2510 Atherosclerotic heart disease of native coronary artery without angina pectoris: Secondary | ICD-10-CM

## 2022-04-22 DIAGNOSIS — I5022 Chronic systolic (congestive) heart failure: Secondary | ICD-10-CM | POA: Diagnosis not present

## 2022-04-22 NOTE — Patient Instructions (Signed)
Medication Instructions:   Your physician recommends that you continue on your current medications as directed. Please refer to the Current Medication list given to you today.  Labwork:  none  Testing/Procedures:  none  Follow-Up:  Your physician recommends that you schedule a follow-up appointment in: 4 months.   Any Other Special Instructions Will Be Listed Below (If Applicable).  If you need a refill on your cardiac medications before your next appointment, please call your pharmacy. 

## 2022-04-22 NOTE — Progress Notes (Signed)
Clinical Summary [name] [name] is a 57 y.o.male seen today for follow up of the following medical problems.    1. CAD/ICM/Chronic systolic HF - history of anterior STEMI 02/2008, received DES to LAD. LVEF at that time was 35% - 2012 myoview anterior scar with mild ischemia - 06/2011 echo LVEF 45-50% - 06/2011 GXT no ischemia  - 07/2016 echo shows LVEF down to 30-35%, diffuse hypokinesis with akinesis of anerior and anteroseptal walls. Evidence of LV thrombus - with drop in LVEF and ongoing chest pain was referred for cath  08/2016 cath: see report below, received DES to D2 99%. Ok to stop ASA once coumadin is therapeutic per interventional cardiology, continue plavix.  11/2016 echo LVEF 35%, apical thrombus - 02/2017 echo LVEF 35-40%, restrictive, no clear thrombus    Jan 2020 nuclear stress: large anterior/anteroseptal scar, LVEF 22%.   - 06/2020 echo LVEF 25%  - has seen EP and discussed ICD, patient asked to give ICD some thought. Has not made up mind       07/2021 echo: LVEF LVEF 25-30%    10/2021 nuclear stress: no ischemia, there is prior scar 02/2022 echo: LVEF 25-30%,  - considering barostim - SOB/DOE with short distances. No recent edema - compliant with meds. Back on farxiga and tolerating.    2. History of prior LV thrombus - noted on 11/2016 echo -repeat echo 02/2017 showed resolution of clot, coumadin was stopped.  -06/2020 echo no thrombus     3. HTN - compliant with meds     4. OSA - followed by Dr Elsworth Soho - per patient considering inspire   5.Hyperlipidemia 06/2020 TC 208 TG 116 HDL 38 LDL 149 - changed from simva to atorvastatin 80mg  daily   -upcoming labs pcp Past Medical History:  Diagnosis Date   Anxiety    Arthritis    in lower back   Coronary artery disease    a. 02/2008: anterior STEMI s/p DES to prox LAD, b. Myoview 2012: scar in the anterior wall with mild ischemia and EF 33%, c. neg GXT 06/2011  d. 08/2016: cath with DES to diagonal    Depression  with anxiety    Diabetes mellitus    TYPE II   Fatty liver    H/O ELEVATED LIVER ENZYMES   High cholesterol    Hyperlipidemia    Hypertension    Ischemic cardiomyopathy 08/14/2016   a. Previous EF 33-35% 2012, b. 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 ([place])    Myocardial infarction ([place])    Noncompliance    Prior hx of med noncompliance   OSA (obstructive sleep apnea)    Sleep apnea      No Known Allergies   Current Outpatient Medications  Medication Sig Dispense Refill   AMBULATORY NON FORMULARY MEDICATION 0.2 mLs by Intracavernosal route as needed. Medication Name: Trimix  PGE 86mcg Pap 30mg  Phent 1mg  5 mL 5   aspirin EC 81 MG tablet Take 81 mg by mouth daily.     atorvastatin (LIPITOR) 80 MG tablet Take 1 tablet by mouth once daily 90 tablet 2   BD PEN NEEDLE NANO 2ND GEN 32G X 4 MM MISC USE AS DIRECTED ONCE DAILY WITH SOLIQUA     carvedilol (COREG) 25 MG tablet Take 25 mg by mouth 2 (two) times daily.     escitalopram (LEXAPRO) 20 MG tablet Take 20 mg by mouth daily.     glipiZIDE (  GLUCOTROL XL) 10 MG 24 hr tablet Take 10 mg by mouth daily.     lisinopril (ZESTRIL) 20 MG tablet Take 20 mg by mouth daily.     nitroGLYCERIN (NITROSTAT) 0.4 MG SL tablet Place 0.4 mg under the tongue every 5 (five) minutes as needed for chest pain.     RYBELSUS 7 MG TABS Take 1 tablet by mouth every morning.     sacubitril-valsartan (ENTRESTO) 97-103 MG Take 1 tablet by mouth twice daily 60 tablet 6   Semaglutide (RYBELSUS) 3 MG TABS Take 3 mg by mouth daily.     Semaglutide,0.25 or 0.5MG /DOS, (OZEMPIC, 0.25 OR 0.5 MG/DOSE,) 2 MG/1.5ML SOPN as directed Subcutaneous weekly for 30 days     solifenacin (VESICARE) 5 MG tablet Take 1 tablet (5 mg total) by mouth daily. 30 tablet 11   spironolactone (ALDACTONE) 25 MG tablet Take 1 tablet (25 mg total) by mouth daily. 90 tablet 3   tadalafil (CIALIS) 20 MG tablet Take 1 tablet (20 mg total) by mouth as  needed. 10 tablet 11   TOUJEO MAX SOLOSTAR 300 UNIT/ML Solostar Pen SMARTSIG:40 Unit(s) SUB-Q Daily     vardenafil (LEVITRA) 20 MG tablet Take 1 tablet (20 mg total) by mouth daily as needed for erectile dysfunction. 6 tablet 0   No current facility-administered medications for this visit.     Past Surgical History:  Procedure Laterality Date   CARDIAC CATHETERIZATION     CORONARY ANGIOPLASTY     CORONARY PRESSURE/FFR STUDY N/A 08/21/2016   Procedure: INTRAVASCULAR PRESSURE WIRE/FFR STUDY;  Surgeon: Burnell Blanks, MD;  Location: [place] [facility];  Service: Cardiovascular;  Laterality: N/A;   CORONARY STENT INTERVENTION N/A 08/21/2016   Procedure: CORONARY STENT INTERVENTION;  Surgeon: Burnell Blanks, MD;  Location: [place] [facility];  Service: Cardiovascular;  Laterality: N/A;   CYSTOSCOPY N/A 12/04/2019   Procedure: CYSTOSCOPY;  Surgeon: Cleon Gustin, MD;  Location: [facility];  Service: Urology;  Laterality: N/A;   LIVER BIOPSY     MI WITH STENTS     NASAL SEPTOPLASTY W/ TURBINOPLASTY N/A 01/25/2019   Procedure: BILATERAL TURBINATE REDUCTION;  Surgeon: Leta Baptist, MD;  Location: [place];  Service: ENT;  Laterality: N/A;   NASAL SINUS SURGERY     RIGHT/LEFT HEART CATH AND CORONARY ANGIOGRAPHY N/A 08/21/2016   Procedure: Right/Left Heart Cath and Coronary Angiography;  Surgeon: Burnell Blanks, MD;  Location: [place] [facility];  Service: Cardiovascular;  Laterality: N/A;   TRANSURETHRAL RESECTION OF PROSTATE N/A 12/04/2019   Procedure: TRANSURETHRAL RESECTION OF THE PROSTATE (TURP);  Surgeon: Cleon Gustin, MD;  Location: [facility];  Service: Urology;  Laterality: N/A;     No Known Allergies    Family History  Problem Relation Age of Onset   Coronary artery disease Other        UNKNOWN   Heart disease Father        CAD s/p CABG   Diabetes Mellitus II Father    Diabetes Mellitus II Mother    Cancer Brother      Social History Mr. Yother reports that  he has never smoked. He has never been exposed to tobacco smoke. He has never used smokeless tobacco. Mr. Jaskolski reports no history of alcohol use.   Review of Systems CONSTITUTIONAL: No weight loss, fever, chills, weakness or fatigue.  HEENT: Eyes: No visual loss, blurred vision, double vision or yellow sclerae.No hearing loss, sneezing, congestion, runny nose or sore throat.  SKIN: No rash or itching.  CARDIOVASCULAR: per hpi RESPIRATORY: No shortness of breath, cough or sputum.  GASTROINTESTINAL: No anorexia, nausea, vomiting or diarrhea. No abdominal pain or blood.  GENITOURINARY: No burning on urination, no polyuria NEUROLOGICAL: No headache, dizziness, syncope, paralysis, ataxia, numbness or tingling in the extremities. No change in bowel or bladder control.  MUSCULOSKELETAL: No muscle, back pain, joint pain or stiffness.  LYMPHATICS: No enlarged nodes. No history of splenectomy.  PSYCHIATRIC: No history of depression or anxiety.  ENDOCRINOLOGIC: No reports of sweating, cold or heat intolerance. No polyuria or polydipsia.  Marland Kitchen   Physical Examination Today's Vitals   04/22/22 0815  BP: 120/82  Pulse: 82  SpO2: 93%  Weight: 250 lb 9.6 oz (113.7 kg)  Height: 5\' 10"  (1.778 m)   Body mass index is 35.96 kg/m.  Gen: resting comfortably, no acute distress HEENT: no scleral icterus, pupils equal round and reactive, no palptable cervical adenopathy,  CV: RRR, no mrg, no jvd Resp: Clear to auscultation bilaterally GI: abdomen is soft, non-tender, non-distended, normal bowel sounds, no hepatosplenomegaly MSK: extremities are warm, no edema.  Skin: warm, no rash Neuro:  no focal deficits Psych: appropriate affect   Diagnostic Studies 6./2013 echo Study Conclusions  - Left ventricle: Systolic function was mildly reduced. The   estimated ejection fraction was in the range of 45% to   50%. - Left atrium: The atrium was mildly dilated.   08/07/16 echo Study Conclusions   - Left  ventricle: LV systolic function is depressed at   approximately 30 to 35% with severe hypokinesis/akinesis of the   inferior, septal, distal lateral and apical walls. Cannot exclude   mural thrombus at apex. Suggest limited echo with Definity to   define The cavity size was normal. Wall thickness was normal. - Mitral valve: There was mild regurgitation. - Left atrium: The atrium was mildly dilated.   08/14/16 echo Study Conclusions   - Procedure narrative: Transthoracic echocardiography. Image   quality was adequate. The study was technically difficult.   Intravenous contrast (Definity) was administered. - Left ventricle: Systolic function was moderately to severely   reduced. The estimated ejection fraction was in the range of 30%   to 35%. Diffuse hypokinesis. There is akinesis of the   anteroseptal, anterior, and apical myocardium. - Right ventricle: Systolic function was normal.   Impressions:   - Limited study with Definity contrast. LVEF is in the range of   30-35%. There is a loosely organized LV apical thrombus noted.   Diffuse hypokinesis overall with akinesis of the anterior,   anteroseptal, and apical myocardium. RV contraction normal based   on limited views.   08/2016 cath Mid RCA-2 lesion, 30 %stenosed. Mid RCA-1 lesion, 30 %stenosed. Ost Ramus to Ramus lesion, 20 %stenosed. Ost LAD to Prox LAD lesion, 10 %stenosed. Prox LAD to Mid LAD lesion, 60 %stenosed. A STENT SYNERGY DES 2.25X12 drug eluting stent was successfully placed. 2nd Diag lesion, 99 %stenosed. Post intervention, there is a 0% residual stenosis.   1. Patent stent proximal LAD 2. Moderate stenosis mid LAD. FFR was 0.84 suggesting the stenosis was not flow limiting. 3. The Diagonal Dusty Raczkowski is a moderate caliber vessel with a 99% stenosis. 4. Successful PTCA/DES x 1 Diagonal Fransisca Shawn 5. Mild disease RCA   Recommendations: Will continue ASA and Plavix for now. He will need to be started on coumadin  tomorrow before discharge for his LV thrombus. He can stop ASA when his INR is therapeutic on coumadin  since he was not an ACS. I would plan long term coumadin for LV thrombus and Plavix for at least one year given placement of DES. Continue statin and beta blocker.    02/2017 echo Study Conclusions   - Left ventricle: The cavity size was normal. Wall thickness was   increased in a pattern of mild LVH. Systolic function was   moderately reduced. The estimated ejection fraction was in the   range of 35% to 40%. No definitive thrombus seen with contrast   enhancement. Diffuse hypokinesis. Diastolic dysfunction with   restrictive physiology. - Regional wall motion abnormality: Akinesis of the mid anterior   and basal-mid anteroseptal myocardium. - Right ventricle: Systolic function was reduced.     Jan 2020 nuclear stress No diagnostic ST segment changes to indicate ischemia. Large, dense, anterior and anteroseptal defect extending from apex to base consistent with infarct scar. This is a high risk study. Nuclear stress EF: 22%. Consistent with ischemic cardiomyopathy and large anterior/anteroseptal infarct scar, no significant ischemia.       07/2021 echo 1. Septal, apical , distal anterior wall and inferior hypokinesis No  mural apical thormbus EF similar to prior echo 07/08/20 Findings consistent  with ischemic DCM . Left ventricular ejection fraction, by estimation, is  25 to 30%. The left ventricle has  severely decreased function. The left ventricle demonstrates regional wall  motion abnormalities (see scoring diagram/findings for description). The  left ventricular internal cavity size was severely dilated. Left  ventricular diastolic parameters were  normal.   2. Right ventricular systolic function is normal. The right ventricular  size is normal.   3. Left atrial size was mildly dilated.   4. The mitral valve is normal in structure. Trivial mitral valve  regurgitation. No  evidence of mitral stenosis.   5. The aortic valve is tricuspid. Aortic valve regurgitation is not  visualized. No aortic stenosis is present.   6. The inferior vena cava is normal in size with greater than 50%  respiratory variability, suggesting right atrial pressure of 3 mmHg.     10/2021 nuclear stress  Findings are consistent with large prior anteroseptal  myocardial infarction. There is no current ischemia.  The study is high risk based on decreased LVEF alone, there is no current myocardium at jeopardy. Consider correlating LVEF with echo.   No ST deviation was noted.   LV perfusion is abnormal.   Left ventricular function is abnormal. Nuclear stress EF: 24 %. End diastolic cavity size is severely enlarged.  Assessment and Plan   1. CAD/ICM/chronic systolic HF - euvolemic today. Chronic stable SOB/DOE, NYHA III - on optimal medical therapy - consideirn barostim, looking for more information if would affect future placmeent of an inspire device. I have sent a message to vascular, EP, and pulmoanry as I am not familiar - continue current meds - has not committed to ICD as of yet, he is followed by EP     2. Hyperlipidemia -at goal, continue current meds  F/u 4 months     Arnoldo Lenis, M.D.

## 2022-04-27 ENCOUNTER — Encounter: Payer: Self-pay | Admitting: Urology

## 2022-04-27 ENCOUNTER — Ambulatory Visit (INDEPENDENT_AMBULATORY_CARE_PROVIDER_SITE_OTHER): Payer: 59 | Admitting: Urology

## 2022-04-27 VITALS — BP 148/99 | HR 89

## 2022-04-27 DIAGNOSIS — N401 Enlarged prostate with lower urinary tract symptoms: Secondary | ICD-10-CM

## 2022-04-27 DIAGNOSIS — R339 Retention of urine, unspecified: Secondary | ICD-10-CM

## 2022-04-27 DIAGNOSIS — N138 Other obstructive and reflux uropathy: Secondary | ICD-10-CM | POA: Diagnosis not present

## 2022-04-27 LAB — URINALYSIS, ROUTINE W REFLEX MICROSCOPIC
Bilirubin, UA: NEGATIVE
Ketones, UA: NEGATIVE
Leukocytes,UA: NEGATIVE
Nitrite, UA: NEGATIVE
Protein,UA: NEGATIVE
RBC, UA: NEGATIVE
Specific Gravity, UA: 1.02 (ref 1.005–1.030)
Urobilinogen, Ur: 0.2 mg/dL (ref 0.2–1.0)
pH, UA: 5.5 (ref 5.0–7.5)

## 2022-04-27 LAB — BLADDER SCAN AMB NON-IMAGING: Scan Result: 48

## 2022-04-27 MED ORDER — TAMSULOSIN HCL 0.4 MG PO CAPS: 0.4000 mg | ORAL_CAPSULE | Freq: Every day | ORAL | 11 refills | Status: DC

## 2022-04-27 NOTE — Progress Notes (Unsigned)
04/27/2022 9:25 AM   [name] [name]. 1965-10-10 [id number]  Referring provider: Rebekah Chesterfield, NP [address] [place] [address] [place],  [place] [address]  Painful ejaculation and OAB   HPI: Mr Folkerts is a 57yo here for followup for PAB and pain with ejaculation. He is doing well on vesicare. He has a hx of TURP and he has a strong stream.  He has intermittent starting and stopping. He has suprapubic pain with ejaculation. The pain last 30-60 seconds and then subsides He has pain with trimix injections.    PMH: Past Medical History:  Diagnosis Date   Anxiety    Arthritis    in lower back   Coronary artery disease    a. 02/2008: anterior STEMI s/p DES to prox LAD, b. Myoview 2012: scar in the anterior wall with mild ischemia and EF 33%, c. neg GXT 06/2011  d. 08/2016: cath with DES to diagonal    Depression with anxiety    Diabetes mellitus    TYPE II   Fatty liver    H/O ELEVATED LIVER ENZYMES   High cholesterol    Hyperlipidemia    Hypertension    Ischemic cardiomyopathy 08/14/2016   a. Previous EF 33-35% 2012, b. 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    Myocardial infarction    Noncompliance    Prior hx of med noncompliance   OSA (obstructive sleep apnea)    Sleep apnea     Surgical History: Past Surgical History:  Procedure Laterality Date   CARDIAC CATHETERIZATION     CORONARY ANGIOPLASTY     CORONARY PRESSURE/FFR STUDY N/A 08/21/2016   Procedure: INTRAVASCULAR PRESSURE WIRE/FFR STUDY;  Surgeon: Kathleene Hazel, MD;  Location: [facility];  Service: Cardiovascular;  Laterality: N/A;   CORONARY STENT INTERVENTION N/A 08/21/2016   Procedure: CORONARY STENT INTERVENTION;  Surgeon: Kathleene Hazel, MD;  Location: [facility];  Service: Cardiovascular;  Laterality: N/A;   CYSTOSCOPY N/A 12/04/2019   Procedure: CYSTOSCOPY;  Surgeon: Malen Gauze, MD;  Location: [facility];  Service: Urology;  Laterality:  N/A;   LIVER BIOPSY     MI WITH STENTS     NASAL SEPTOPLASTY W/ TURBINOPLASTY N/A 01/25/2019   Procedure: BILATERAL TURBINATE REDUCTION;  Surgeon: Newman Pies, MD;  Location: [facility];  Service: ENT;  Laterality: N/A;   NASAL SINUS SURGERY     RIGHT/LEFT HEART CATH AND CORONARY ANGIOGRAPHY N/A 08/21/2016   Procedure: Right/Left Heart Cath and Coronary Angiography;  Surgeon: Kathleene Hazel, MD;  Location: [facility];  Service: Cardiovascular;  Laterality: N/A;   TRANSURETHRAL RESECTION OF PROSTATE N/A 12/04/2019   Procedure: TRANSURETHRAL RESECTION OF THE PROSTATE (TURP);  Surgeon: Malen Gauze, MD;  Location: [facility];  Service: Urology;  Laterality: N/A;    Home Medications:  Allergies as of 04/27/2022   No Known Allergies      Medication List        Accurate as of April 27, 2022  9:25 AM. If you have any questions, ask your nurse or doctor.          AMBULATORY NON FORMULARY MEDICATION 0.2 mLs by Intracavernosal route as needed. Medication Name: Trimix  PGE Pap 30mg  Phent 1mg    aspirin EC 81 MG tablet Take 81 mg by mouth daily.   atorvastatin 80 MG tablet Commonly known as: LIPITOR Take 1 tablet by mouth once daily  BD Pen Needle Nano 2nd Gen 32G X 4 MM Misc Generic drug: Insulin Pen Needle USE AS DIRECTED ONCE DAILY WITH SOLIQUA   carvedilol 25 MG tablet Commonly known as: COREG Take 25 mg by mouth 2 (two) times daily.   dapagliflozin propanediol 10 MG Tabs tablet Commonly known as: FARXIGA Take 10 mg by mouth daily.   Entresto 97-103 MG Generic drug: sacubitril-valsartan Take 1 tablet by mouth twice daily   escitalopram 20 MG tablet Commonly known as: LEXAPRO Take 20 mg by mouth daily.   glipiZIDE 10 MG 24 hr tablet Commonly known as: GLUCOTROL XL Take 10 mg by mouth daily.   nitroGLYCERIN 0.4 MG SL tablet Commonly known as: NITROSTAT Place 0.4 mg under the tongue every 5 (five) minutes as needed for chest pain.   Ozempic (0.25  or 0.5 MG/DOSE) 2 MG/1.5ML Sopn Generic drug: Semaglutide(0.25 or 0.5MG /DOS) as directed Subcutaneous weekly for 30 days   solifenacin 5 MG tablet Commonly known as: VESICARE Take 1 tablet (5 mg total) by mouth daily.   spironolactone 25 MG tablet Commonly known as: ALDACTONE Take 1 tablet (25 mg total) by mouth daily.   Toujeo Max SoloStar 300 UNIT/ML Solostar Pen Generic drug: insulin glargine (2 Unit Dial) Inject 70 Units into the skin daily.        Allergies: No Known Allergies  Family History: Family History  Problem Relation Age of Onset   Coronary artery disease Other        UNKNOWN   Heart disease Father        CAD s/p CABG   Diabetes Mellitus II Father    Diabetes Mellitus II Mother    Cancer Brother     Social History:  reports that he has never smoked. He has never been exposed to tobacco smoke. He has never used smokeless tobacco. He reports that he does not drink alcohol and does not use drugs.  ROS: All other review of systems were reviewed and are negative except what is noted above in HPI  Physical Exam: BP (!) 148/99   Pulse 89   Constitutional:  Alert and oriented, No acute distress. HEENT: [place] AT, moist mucus membranes.  Trachea midline, no masses. Cardiovascular: No clubbing, cyanosis, or edema. Respiratory: Normal respiratory effort, no increased work of breathing. GI: Abdomen is soft, nontender, nondistended, no abdominal masses GU: No CVA tenderness.  Lymph: No cervical or inguinal lymphadenopathy. Skin: No rashes, bruises or suspicious lesions. Neurologic: Grossly intact, no focal deficits, moving all 4 extremities. Psychiatric: Normal mood and affect.  Laboratory Data: Lab Results  Component Value Date   WBC 10.2 12/05/2019   HGB 14.2 12/05/2019   HCT 44.4 12/05/2019   MCV 91.5 12/05/2019   PLT 247 12/05/2019    Lab Results  Component Value Date   CREATININE 0.82 03/10/2022    Lab Results  Component Value Date   PSA 0.5  07/12/2019    No results found for: "TESTOSTERONE"  Lab Results  Component Value Date   HGBA1C 7.4 (H) 12/04/2019    Urinalysis    Component Value Date/Time   COLORURINE AMBER (A) 05/19/2019 1600   APPEARANCEUR Clear 02/04/2022 0859   LABSPEC 1.031 (H) 05/19/2019 1600   PHURINE 6.0 05/19/2019 1600   GLUCOSEU Negative 02/04/2022 0859   HGBUR LARGE (A) 05/19/2019 1600   BILIRUBINUR Negative 02/04/2022 0859   KETONESUR 20 (A) 05/19/2019 1600   PROTEINUR Negative 02/04/2022 0859   PROTEINUR >=300 (A) 05/19/2019 1600   UROBILINOGEN 0.2 09/29/2019  0910   NITRITE Negative 02/04/2022 0859   NITRITE NEGATIVE 05/19/2019 1600   LEUKOCYTESUR Negative 02/04/2022 0859   LEUKOCYTESUR NEGATIVE 05/19/2019 1600    Lab Results  Component Value Date   LABMICR Comment 02/04/2022   WBCUA 0-5 12/04/2020   LABEPIT 0-10 12/04/2020   MUCUS Present (A) 12/04/2020   BACTERIA Few (A) 12/04/2020    Pertinent Imaging: No results found for this or any previous visit.  No results found for this or any previous visit.  No results found for this or any previous visit.  No results found for this or any previous visit.  No results found for this or any previous visit.  No valid procedures specified. No results found for this or any previous visit.  No results found for this or any previous visit.   Assessment & Plan:    1. Incomplete bladder emptying -resolved - BLADDER SCAN AMB NON-IMAGING  2. Benign prostatic hyperplasia with urinary obstruction -vesicare 5mg  daily - Urinalysis, Routine w reflex microscopic - BLADDER SCAN AMB NON-IMAGING  3. Erectile dysfunction -trimix PRN  4. Painful ejaculation -floma x0.4mg  daily    No follow-ups on file.  Wilkie AyePatrick Cherron Blitzer, MD  [facility] [place]

## 2022-04-27 NOTE — Progress Notes (Unsigned)
post void residual=48 

## 2022-04-27 NOTE — Patient Instructions (Signed)
Erectile Dysfunction ?Erectile dysfunction (ED) is the inability to get or keep an erection in order to have sexual intercourse. ED is considered a symptom of an underlying disorder and is not considered a disease. ED may include: ?Inability to get an erection. ?Lack of enough hardness of the erection to allow penetration. ?Loss of erection before sex is finished. ?What are the causes? ?This condition may be caused by: ?Physical causes, such as: ?Artery problems. This may include heart disease, high blood pressure, atherosclerosis, and diabetes. ?Hormonal problems, such as low testosterone. ?Obesity. ?Nerve problems. This may include back or pelvic injuries, multiple sclerosis, Parkinson's disease, spinal cord injury, and stroke. ?Certain medicines, such as: ?Pain relievers. ?Antidepressants. ?Blood pressure medicines and water pills (diuretics). ?Cancer medicines. ?Antihistamines. ?Muscle relaxants. ?Lifestyle factors, such as: ?Use of drugs such as marijuana, cocaine, or opioids. ?Excessive use of alcohol. ?Smoking. ?Lack of physical activity or exercise. ?Psychological causes, such as: ?Anxiety or stress. ?Sadness or depression. ?Exhaustion. ?Fear about sexual performance. ?Guilt. ?What are the signs or symptoms? ?Symptoms of this condition include: ?Inability to get an erection. ?Lack of enough hardness of the erection to allow penetration. ?Loss of the erection before sex is finished. ?Sometimes having normal erections, but with frequent unsatisfactory episodes. ?Low sexual satisfaction in either partner due to erection problems. ?A curved penis occurring with erection. The curve may cause pain, or the penis may be too curved to allow for intercourse. ?Never having nighttime or morning erections. ?How is this diagnosed? ?This condition is often diagnosed by: ?Performing a physical exam to find other diseases or specific problems with the penis. ?Asking you detailed questions about the problem. ?Doing tests,  such as: ?Blood tests to check for diabetes mellitus or high cholesterol, or to measure hormone levels. ?Other tests to check for underlying health conditions. ?An ultrasound exam to check for scarring. ?A test to check blood flow to the penis. ?Doing a sleep study at home to measure nighttime erections. ?How is this treated? ?This condition may be treated by: ?Medicines, such as: ?Medicine taken by mouth to help you achieve an erection (oral medicine). ?Hormone replacement therapy to replace low testosterone levels. ?Medicine that is injected into the penis. Your health care provider may instruct you how to give yourself these injections at home. ?Medicine that is delivered with a short applicator tube. The tube is inserted into the opening at the tip of the penis, which is the opening of the urethra. A tiny pellet of medicine is put in the urethra. The pellet dissolves and enhances erectile function. This is also called MUSE (medicated urethral system for erections) therapy. ?Vacuum pump. This is a pump with a ring on it. The pump and ring are placed on the penis and used to create pressure that helps the penis become erect. ?Penile implant surgery. In this procedure, you may receive: ?An inflatable implant. This consists of cylinders, a pump, and a reservoir. The cylinders can be inflated with a fluid that helps to create an erection, and they can be deflated after intercourse. ?A semi-rigid implant. This consists of two silicone rubber rods. The rods provide some rigidity. They are also flexible, so the penis can both curve downward in its normal position and become straight for sexual intercourse. ?Blood vessel surgery to improve blood flow to the penis. During this procedure, a blood vessel from a different part of the body is placed into the penis to allow blood to flow around (bypass) damaged or blocked blood vessels. ?Lifestyle changes,   such as exercising more, losing weight, and quitting smoking. ?Follow  these instructions at home: ?Medicines ? ?Take over-the-counter and prescription medicines only as told by your health care provider. Do not increase the dosage without first discussing it with your health care provider. ?If you are using self-injections, do injections as directed by your health care provider. Make sure you avoid any veins that are on the surface of the penis. After giving an injection, apply pressure to the injection site for 5 minutes. ?Talk to your health care provider about how to prevent headaches while taking ED medicines. These medicines may cause a sudden headache due to the increase in blood flow in your body. ?General instructions ?Exercise regularly, as directed by your health care provider. Work with your health care provider to lose weight, if needed. ?Do not use any products that contain nicotine or tobacco. These products include cigarettes, chewing tobacco, and vaping devices, such as e-cigarettes. If you need help quitting, ask your health care provider. ?Before using a vacuum pump, read the instructions that come with the pump and discuss any questions with your health care provider. ?Keep all follow-up visits. This is important. ?Contact a health care provider if: ?You feel nauseous. ?You are vomiting. ?You get sudden headaches while taking ED medicines. ?You have any concerns about your sexual health. ?Get help right away if: ?You are taking oral or injectable medicines and you have an erection that lasts longer than 4 hours. If your health care provider is unavailable, go to the nearest emergency room for evaluation. An erection that lasts much longer than 4 hours can result in permanent damage to your penis. ?You have severe pain in your groin or abdomen. ?You develop redness or severe swelling of your penis. ?You have redness spreading at your groin or lower abdomen. ?You are unable to urinate. ?You experience chest pain or a rapid heartbeat (palpitations) after taking oral  medicines. ?These symptoms may represent a serious problem that is an emergency. Do not wait to see if the symptoms will go away. Get medical help right away. Call your local emergency services (911 in the [place] Do not drive yourself to the hospital. ?Summary ?Erectile dysfunction (ED) is the inability to get or keep an erection during sexual intercourse. ?This condition is diagnosed based on a physical exam, your symptoms, and tests to determine the cause. Treatment varies depending on the cause and may include medicines, hormone therapy, surgery, or a vacuum pump. ?You may need follow-up visits to make sure that you are using your medicines or devices correctly. ?Get help right away if you are taking or injecting medicines and you have an erection that lasts longer than 4 hours. ?This information is not intended to replace advice given to you by your health care provider. Make sure you discuss any questions you have with your health care provider. ?Document Revised: 04/03/2020 Document Reviewed: 04/03/2020 ?Elsevier Patient Education ? 2023 [employer] ? ?

## 2022-04-28 ENCOUNTER — Encounter: Payer: Self-pay | Admitting: *Deleted

## 2022-04-28 ENCOUNTER — Telehealth: Payer: Self-pay | Admitting: *Deleted

## 2022-04-28 NOTE — Telephone Encounter (Signed)
Called and spoke with the pt  He states calling bc he forgot his appt with ENT for Inspire eval  He wants to reschedule  I sent msg to him via mychart with their phone number so he can call and reschedule per his request  Nothing further needed

## 2022-04-28 NOTE — Telephone Encounter (Signed)
Patient would like to check on status of HST results. Patient states that Dr. Vassie Loll wanted to put him on inspire. Please call and advise patient. HST was done on 01/2022. Patients number [phone] ok to speak

## 2022-04-30 NOTE — Research (Signed)
Batwire Informed Consent   Subject Name: [name] [name].  Subject met inclusion and exclusion criteria.  The informed consent form, study requirements and expectations were reviewed with the subject and questions and concerns were addressed prior to the signing of the consent form.  The subject verbalized understanding of the trial requirements.  The subject agreed to participate in the [facility] trial and signed the informed consent on 03/10/2022.  The informed consent was obtained prior to performance of any protocol-specific procedures for the subject.  A copy of the signed informed consent was given to the subject and a copy was placed in the subject's medical record.   Mercer Pod D

## 2022-05-01 ENCOUNTER — Ambulatory Visit (HOSPITAL_COMMUNITY): Admission: RE | Admit: 2022-05-01 | Payer: 59 | Source: Home / Self Care | Admitting: Surgery

## 2022-05-01 ENCOUNTER — Encounter (HOSPITAL_COMMUNITY): Admission: RE | Payer: Self-pay | Source: Home / Self Care

## 2022-05-01 DIAGNOSIS — I255 Ischemic cardiomyopathy: Secondary | ICD-10-CM

## 2022-05-01 SURGERY — INSERTION, CAROTID SINUS BAROREFLEX ACTIVATION DEVICE
Anesthesia: General | Laterality: Right

## 2022-05-11 NOTE — Telephone Encounter (Signed)
Taryn sent this referral in January.  Sent message to her to check on it.

## 2022-05-18 ENCOUNTER — Encounter: Payer: Self-pay | Admitting: *Deleted

## 2022-05-18 DIAGNOSIS — Z006 Encounter for examination for normal comparison and control in clinical research program: Secondary | ICD-10-CM

## 2022-05-18 NOTE — Research (Signed)
Section A.   Administrative Section  Patient ID: [id number] - _[id number] - [id number] Patient Initials: _R_ _L_ _C_  Exit / Termination Date: _29_/_APR_/2024_                                            (DD / MMM / Annamarie Major)  Section B.   Reason for Exit / Termination  []  Screening/baseline failure  []  Failed Implant Attempt(s)  []  Completed all Study Required Visits  []  PI Withdrew  []  Subject withdrew consent (add comment below)  []  Subject refuses further follow-up testing (add comment below)  []  Lost to Follow-up  []  System Explanted (Complete Additional Procedure Worksheet)  []  Deceased (Complete Subject Death Worksheet)  [x]  Other, please specify: ___batwire study closed to enrollment ________  Comments  patient cancelled procedure prior to closing to be rescheduled at a later date, patient aware that study is closed to enrollment        Section C. Signature   Person completing form (Print Name): __Kimberly Ivory Broad :) _________  Signature: Mercer Pod :) ____ Date: __04/29/2024____________

## 2022-05-22 ENCOUNTER — Other Ambulatory Visit: Payer: Self-pay | Admitting: Urology

## 2022-06-02 DIAGNOSIS — Z6835 Body mass index (BMI) 35.0-35.9, adult: Secondary | ICD-10-CM | POA: Insufficient documentation

## 2022-06-18 ENCOUNTER — Other Ambulatory Visit: Payer: Self-pay | Admitting: Otolaryngology

## 2022-06-18 ENCOUNTER — Telehealth: Payer: Self-pay | Admitting: Cardiology

## 2022-06-18 NOTE — Telephone Encounter (Signed)
     Primary Cardiologist: [name], MD  Chart reviewed as part of pre-operative protocol coverage. Given past medical history and time since last visit, based on ACC/AHA guidelines, [name] [name]. would be at acceptable risk for the planned procedure without further cardiovascular testing.     I will route this recommendation to the requesting party via Epic fax function and remove from pre-op pool.  Please call with questions.  Thomasene Ripple. Analeese Andreatta NP-C    06/18/2022, 1:26 PM [facility] [address] Office [phone] Fax [phone]

## 2022-06-18 NOTE — Telephone Encounter (Signed)
   Pre-operative Risk Assessment    Patient Name: [name] [name].  DOB: December 01, 1965 MRN: [id number]      Request for Surgical Clearance    Procedure:   Drug Induced Sleep Endoscopy  Date of Surgery:  Clearance 06/24/22                                 Surgeon:  Dr. Christia Reading  Surgeon's Group or Practice Name:  [facility]   Phone number:  [phone] Fax number:  [phone]   Type of Clearance Requested:   - Medical    Type of Anesthesia:  General    Additional requests/questions:    Signed, April Henson   06/18/2022, 1:12 PM

## 2022-06-22 ENCOUNTER — Other Ambulatory Visit: Payer: Self-pay | Admitting: Urology

## 2022-06-23 ENCOUNTER — Telehealth: Payer: Self-pay | Admitting: Cardiology

## 2022-06-23 ENCOUNTER — Encounter (HOSPITAL_COMMUNITY): Payer: Self-pay | Admitting: Otolaryngology

## 2022-06-23 ENCOUNTER — Other Ambulatory Visit: Payer: Self-pay

## 2022-06-23 NOTE — Anesthesia Preprocedure Evaluation (Addendum)
Anesthesia Evaluation  Patient identified by MRN, date of birth, ID band Patient awake    Reviewed: Allergy & Precautions, NPO status , Patient's Chart, lab work & pertinent test results  History of Anesthesia Complications (+) PONV and history of anesthetic complications  Airway Mallampati: II  TM Distance: >3 FB Neck ROM: Full    Dental  (+) Dental Advisory Given, Poor Dentition, Missing, Loose, Chipped,    Pulmonary sleep apnea    Pulmonary exam normal        Cardiovascular hypertension, (-) angina + CAD, + Past MI, + Cardiac Stents and +CHF  Normal cardiovascular exam   '24 TTE - EF 25 to 30%. Global hypokinesis with mid to apical septal akinesis and  akinesis of the true apex. The left ventricular internal cavity size was mildly dilated.  Grade I diastolic dysfunction (impaired relaxation). Right ventricular systolic function is mildly reduced. Aortic dilatation noted. There is mild dilatation of the aortic root,  measuring 38 mm.      Neuro/Psych  Headaches PSYCHIATRIC DISORDERS Anxiety Depression       GI/Hepatic negative GI ROS, Neg liver ROS,,,  Endo/Other  diabetes, Type 2, Oral Hypoglycemic Agents, Insulin Dependent  Morbid obesity  Renal/GU negative Renal ROS Bladder dysfunction      Musculoskeletal  (+) Arthritis ,    Abdominal  (+) + obese  Peds  Hematology negative hematology ROS (+)   Anesthesia Other Findings   Reproductive/Obstetrics                             Anesthesia Physical Anesthesia Plan  ASA: 4  Anesthesia Plan: MAC   Post-op Pain Management: Minimal or no pain anticipated   Induction:   PONV Risk Score and Plan: 2 and Propofol infusion, Treatment may vary due to age or medical condition and TIVA  Airway Management Planned: Nasal Cannula and Natural Airway  Additional Equipment: None  Intra-op Plan:   Post-operative Plan:   Informed Consent: I  have reviewed the patients History and Physical, chart, labs and discussed the procedure including the risks, benefits and alternatives for the proposed anesthesia with the patient or authorized representative who has indicated his/her understanding and acceptance.       Plan Discussed with: CRNA and Anesthesiologist  Anesthesia Plan Comments: (See PAT note )        Anesthesia Quick Evaluation

## 2022-06-23 NOTE — Telephone Encounter (Signed)
   Name: [name] [name].  DOB: 1965/06/27  MRN: [id number]   Primary Cardiologist: [name], MD  Per note sent by Edd Fabian, NP on 06/18/2022:  Chart reviewed as part of pre-operative protocol coverage. Given past medical history and time since last visit, based on ACC/AHA guidelines, [name] [name]. would be at acceptable risk for the planned procedure without further cardiovascular testing.   Ideally aspirin should be continued without interruption, however if the bleeding risk is too great, aspirin may be held for 5-7 days prior to surgery. Please resume aspirin post operatively when it is felt to be safe from a bleeding standpoint.      I will route this recommendation to the requesting party via Epic fax function and remove from pre-op pool. Please call with questions.  Carlos Levering, NP 06/23/2022, 3:39 PM

## 2022-06-23 NOTE — Progress Notes (Signed)
[name] denies chest pain or shortness of breath.Patient denies having any s/s of Covid in his household, also denies any known exposure to Covid. [name] denies  any s/s of upper or lower respiratory in the past 8 weeks.   [name] PCP is Dr. Marquita Palms, cardiologist is Dr. Dominga Ferry.  [name] has type II diabetes, patient was not given any instructions on holding medications for diabetes.  Patient took Ozempic on 06/20/22 and took am dose of Farxiga, I instructed patient to hold pm dose. I instructed [name] to not take evening dose of Glipizide this evening or in am, also if CBG is greater than 70 in am take 1/2 dose of Toujeo .  I instructed [name] to check CBG after awaking and every 2 hours until arrival  to the hospital.  I Instructed patient if CBG is less than 70 to take 4 Glucose Tablets or 1 tube of Glucose Gel or 1/2 cup of a clear juice. Recheck CBG in 15 minutes if CBG is not over 70 call, pre- op desk at [phone] for further instructions. If scheduled to receive Insulin, do not take Insulin   [name] reports that he checks CBGs 2 times a day, it runs 160-230.

## 2022-06-23 NOTE — Progress Notes (Addendum)
Anesthesia Chart Review: [name] [name]   Case: [id number] Date/Time: 06/24/22 1315   Procedure: DRUG INDUCED SLEEP ENDOSCOPY (Bilateral)   Anesthesia type: General   Pre-op diagnosis:      Obstructive Sleep Apnea     BMI 35.0-35.9,adult   [facility] 09 / [facility]   Surgeons: Christia Reading, MD       DISCUSSION: Patient is a 57 year old male scheduled for the above procedure.  History includes never smoker, CAD (anterior STEMI, s/p thrombectomy, DES LAD 02/21/2008), ischemic cardiomyopathy, chronic systolic CHF (LV thrombus 2028, s/p warfarin), DM2, fatty liver, OSA (intolerant to CPAP), BPH (s/p TURP 12/04/19)  Last visits with cardiologist Dr. Wyline Mood was on 04/22/22. Chronic stable SOB/DOE, NYHA III. Euvolemic on exam, LVEF 20-25% (03/10/22, 08/13/21) despite optimal therapy. As of 02/10/22 EP visit with M. Carollee Herter, PA-C, he still had not committed to getting a ICD. He was also considered for Barostim Neo Device in April 2024, but he ultimately cancelled the procedure. Continue current medications with 4 month follow-up planned. 11/11/21 nuclear stress test was high risk based on LVEF 24% with large prior anteroseptal infarct, but there was no ischemia or current myocardium at jeopardy.   Preoperative cardiology input outlined by Edd Fabian, NP on 06/18/22, "Given past medical history and time since last visit, based on ACC/AHA guidelines, Ishawn Mulroy. would be at acceptable risk for the planned procedure without further cardiovascular testing."    A1c 8.6% on 03/24/22. He is on Semaglutide 2 mg Q Saturday, Toujeo 30 units daily, glipizide 10 mg dialy, Farxiga 10 mg daily.  Last reported semaglutide: 06/20/22 Last reported Farxiga: 06/23/22. He said he was not previously given instructions to hold semaglutide or Farxiga for procedure.   Confirmed with anesthesiologist Eilene Ghazi, MD. Would recommend rescheduling elective procedure under anesthesia after weekly GLP-1 agonist has been  held for at least 7 days per current guidelines. I have left a voice message for Angie at Dr. Jenne Pane' office and spoken with scheduler Albin Felling about this.   UPDATE 06/23/22 2:45 PM: Angie spoke with me and Mr. Kolodziejski twice as he said that he understood the PAT RN to ask when or what day he takes Ozempic and not when he last took Ozempic. He says he told her that he takes Ozempic on Saturdays butt failed to mention that he had actually not taken in in 2 months because his CBGs have been doing better. Even after discussing risks/rationale of not holding GLP1 agonists, Angie said he was adamant that he had not taken Ozempic in 2 months so case remains as scheduled for 06/24/22. Anesthesia team to evaluate on the day of surgery.).    VS:  BP Readings from Last 3 Encounters:  04/27/22 (!) 148/99  04/22/22 120/82  03/10/22 121/84   Pulse Readings from Last 3 Encounters:  04/27/22 89  04/22/22 82  03/10/22 74     PROVIDERS: Rebekah Chesterfield, NP is PCP  [name], MD is cardiologist. Also followed by EP cardiology, previously Hillis Range, MD but will likely see Steffanie Dunn, MD moving forward. Last visit was with Polly Cobia. Greig Castilla, PA-C on 02/10/22.  Cyril Mourning, MD is pulmonologist Benedict Needy, NP is endocrinology provider Wilkie Aye, MD is urologist   LABS: Most recent lab results in [facility] include: Lab Results  Component Value Date   NA 137 03/10/2022   K 4.7 03/10/2022   CL 102 03/10/2022   CREATININE 0.82 03/10/2022   BUN 13 03/10/2022  CO2 20 03/10/2022   A1c 8.6% on 03/24/22 ([facility]).   IMAGES: CTA Neck 03/26/2022: IMPRESSION: No hemodynamically significant stenosis in the neck.    EKG: 11/06/2021: Normal sinus rhythm Septal infarct, age undetermined   CV: US Carotid 03/19/2022: Summary:  - Right Carotid: The extracranial vessels were near-normal with only minimal wall thickening or plaque.  - Left Carotid: The extracranial vessels were near-normal  with only minimal wall thickening or plaque.  - Vertebrals:  Bilateral vertebral arteries demonstrate antegrade flow.  - Subclavians: Normal flow hemodynamics were seen in bilateral subclavian arteries.    Echo 03/10/2022: IMPRESSIONS   1. Left ventricular ejection fraction, by estimation, is 25 to 30%. The  left ventricle has severely decreased function. The left ventricle  demonstrates global hypokinesis with mid to apical septal akinesis and  akinesis of the true apex. The left  ventricular internal cavity size was mildly dilated. Left ventricular  diastolic parameters are consistent with Grade I diastolic dysfunction  (impaired relaxation).   2. Right ventricular systolic function is mildly reduced. The right  ventricular size is normal. Tricuspid regurgitation signal is inadequate  for assessing PA pressure.   3. The mitral valve is normal in structure. No evidence of mitral valve  regurgitation. No evidence of mitral stenosis.   4. The aortic valve is tricuspid. There is mild calcification of the  aortic valve. Aortic valve regurgitation is not visualized. No aortic  stenosis is present.   5. Aortic dilatation noted. There is mild dilatation of the aortic root,  measuring 38 mm.   6. The inferior vena cava is normal in size with greater than 50%  respiratory variability, suggesting right atrial pressure of 3 mmHg.  - Comparison LVEF: 25-30% with septal, apical, distal anterior wall and inferior hypokinesis 08/13/21, 06/20/20; 35% 06/22/19, 35-40% 03/03/17; 30-35% 12/18/16, 08/07/16, 45-50% 07/07/11; 30-35% 02/22/08 in setting of anterior STEMI    Nuclear stress test 11/11/2021:   Findings are consistent with large prior anteroseptal  myocardial infarction. There is no current ischemia.  The study is high risk based on decreased LVEF alone, there is no current myocardium at jeopardy. Consider correlating LVEF with echo.   No ST deviation was noted.   LV perfusion is abnormal.   Left  ventricular function is abnormal. Nuclear stress EF: 24 %. End diastolic cavity size is severely enlarged.   RHC/LHC 08/21/2016: Mid RCA-2 lesion, 30 %stenosed. Mid RCA-1 lesion, 30 %stenosed. Ost Ramus to Ramus lesion, 20 %stenosed. Ost LAD to Prox LAD lesion, 10 %stenosed. Prox LAD to Mid LAD lesion, 60 %stenosed. A STENT SYNERGY DES 2.25X12 drug eluting stent was successfully placed. 2nd Diag lesion, 99 %stenosed. Post intervention, there is a 0% residual stenosis.   1. Patent stent proximal LAD 2. Moderate stenosis mid LAD. FFR was 0.84 suggesting the stenosis was not flow limiting.  3. The Diagonal branch is a moderate caliber vessel with a 99% stenosis.  4. Successful PTCA/DES x 1 Diagonal branch 5. Mild disease RCA   Recommendations: Will continue ASA and Plavix for now. He will need to be started on coumadin tomorrow before discharge for his LV thrombus. He can stop ASA when his INR is therapeutic on coumadin since he was not an ACS. I would plan long term coumadin for LV thrombus and Plavix for at least one year given placement of DES. Continue statin and beta blocker.    Past Medical History:  Diagnosis Date   Anxiety    Arthritis    in  lower back   Chronic heart failure (HCC)    Coronary artery disease    a. 02/2008: anterior STEMI s/p DES to prox LAD, b. Myoview 2012: scar in the anterior wall with mild ischemia and EF 33%, c. neg GXT 06/2011  d. 08/2016: cath with DES to diagonal    Depression with anxiety    Diabetes mellitus    TYPE II   Fatty liver    H/O ELEVATED LIVER ENZYMES   High cholesterol    Hyperlipidemia    Hypertension    Ischemic cardiomyopathy 08/14/2016   a. Previous EF 33-35% 2012, b. 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)    Myocardial infarction (HCC)    Noncompliance    Prior hx of med noncompliance   OSA (obstructive sleep apnea)    Sleep apnea     Past Surgical History:  Procedure  Laterality Date   CARDIAC CATHETERIZATION     CORONARY ANGIOPLASTY     CORONARY PRESSURE/FFR STUDY N/A 08/21/2016   Procedure: INTRAVASCULAR PRESSURE WIRE/FFR STUDY;  Surgeon: Kathleene Hazel, MD;  [facility];  Service: Cardiovascular;  Laterality: N/A;   CORONARY STENT INTERVENTION N/A 08/21/2016   Procedure: CORONARY STENT INTERVENTION;  Surgeon: Kathleene Hazel, MD;  [facility];  Service: Cardiovascular;  Laterality: N/A;   CYSTOSCOPY N/A 12/04/2019   Procedure: CYSTOSCOPY;  Surgeon: Malen Gauze, MD;  Location: [facility];  Service: Urology;  Laterality: N/A;   LIVER BIOPSY     MI WITH STENTS     NASAL SEPTOPLASTY W/ TURBINOPLASTY N/A 01/25/2019   Procedure: BILATERAL TURBINATE REDUCTION;  Surgeon: Newman Pies, MD;  Location: [facility];  Service: ENT;  Laterality: N/A;   NASAL SINUS SURGERY     RIGHT/LEFT HEART CATH AND CORONARY ANGIOGRAPHY N/A 08/21/2016   Procedure: Right/Left Heart Cath and Coronary Angiography;  Surgeon: Kathleene Hazel, MD;  [facility];  Service: Cardiovascular;  Laterality: N/A;   TRANSURETHRAL RESECTION OF PROSTATE N/A 12/04/2019   Procedure: TRANSURETHRAL RESECTION OF THE PROSTATE (TURP);  Surgeon: Malen Gauze, MD;  Location: [facility];  Service: Urology;  Laterality: N/A;    MEDICATIONS: No current facility-administered medications for this encounter.    acetaminophen (TYLENOL) 650 MG CR tablet   AMBULATORY NON FORMULARY MEDICATION   aspirin EC 81 MG tablet   atorvastatin (LIPITOR) 80 MG tablet   carvedilol (COREG) 25 MG tablet   dapagliflozin propanediol (FARXIGA) 10 MG TABS tablet   escitalopram (LEXAPRO) 20 MG tablet   GEMTESA 75 MG TABS   glipiZIDE (GLUCOTROL XL) 10 MG 24 hr tablet   nitroGLYCERIN (NITROSTAT) 0.4 MG SL tablet   sacubitril-valsartan (ENTRESTO) 97-103 MG   Semaglutide, 2 MG/DOSE, (OZEMPIC, 2 MG/DOSE,) 8 MG/3ML SOPN   solifenacin (VESICARE) 5 MG tablet    tamsulosin (FLOMAX) 0.4 MG CAPS capsule   TOUJEO MAX SOLOSTAR 300 UNIT/ML Solostar Pen   BD PEN NEEDLE NANO 2ND GEN 32G X 4 MM MISC    Shonna Chock, PA-C Surgical Short Stay/Anesthesiology [facility] Phone [phone] [facility] Phone [phone] 06/23/2022 2:01 PM

## 2022-06-23 NOTE — Progress Notes (Signed)
Left a message to pt's voicemail. To arrive tom at 1200.

## 2022-06-23 NOTE — Telephone Encounter (Signed)
   Pre-operative Risk Assessment    Patient Name: [name] [name].  DOB: 1965-02-09 MRN: [id number]      Request for Surgical Clearance    Procedure:   Drug-Induced Sleep Endoscopy  Date of Surgery:  Clearance TBD                                 Surgeon:  Dr. Marijo Conception Group or Practice Name:  [facility] Phone number:  [phone]  Fax number:  [phone]   Type of Clearance Requested:   - Medical  - Pharmacy:  Hold Aspirin TBD by cardiologist   Type of Anesthesia:  General    Additional requests/questions:  Please fax a copy of medical clearance to the surgeon's office.  Mardelle Matte   06/23/2022, 10:41 AM

## 2022-06-24 ENCOUNTER — Ambulatory Visit (HOSPITAL_COMMUNITY)
Admission: RE | Admit: 2022-06-24 | Discharge: 2022-06-24 | Disposition: A | Discharge status: Home / Self Care | Payer: 59 | Attending: Otolaryngology | Admitting: Otolaryngology

## 2022-06-24 ENCOUNTER — Encounter (HOSPITAL_COMMUNITY)
Admission: RE | Disposition: A | Discharge status: Home / Self Care | Payer: Self-pay | Source: Home / Self Care | Attending: Otolaryngology

## 2022-06-24 ENCOUNTER — Other Ambulatory Visit: Payer: Self-pay

## 2022-06-24 ENCOUNTER — Ambulatory Visit (HOSPITAL_BASED_OUTPATIENT_CLINIC_OR_DEPARTMENT_OTHER): Payer: 59 | Admitting: Vascular Surgery

## 2022-06-24 ENCOUNTER — Encounter (HOSPITAL_COMMUNITY): Payer: Self-pay | Admitting: Otolaryngology

## 2022-06-24 ENCOUNTER — Ambulatory Visit (HOSPITAL_COMMUNITY): Payer: 59 | Admitting: Vascular Surgery

## 2022-06-24 DIAGNOSIS — I110 Hypertensive heart disease with heart failure: Secondary | ICD-10-CM | POA: Diagnosis not present

## 2022-06-24 DIAGNOSIS — Z6835 Body mass index (BMI) 35.0-35.9, adult: Secondary | ICD-10-CM

## 2022-06-24 DIAGNOSIS — I2510 Atherosclerotic heart disease of native coronary artery without angina pectoris: Secondary | ICD-10-CM | POA: Diagnosis not present

## 2022-06-24 DIAGNOSIS — Z9079 Acquired absence of other genital organ(s): Secondary | ICD-10-CM | POA: Diagnosis not present

## 2022-06-24 DIAGNOSIS — K760 Fatty (change of) liver, not elsewhere classified: Secondary | ICD-10-CM | POA: Insufficient documentation

## 2022-06-24 DIAGNOSIS — I252 Old myocardial infarction: Secondary | ICD-10-CM | POA: Diagnosis not present

## 2022-06-24 DIAGNOSIS — I5022 Chronic systolic (congestive) heart failure: Secondary | ICD-10-CM | POA: Insufficient documentation

## 2022-06-24 DIAGNOSIS — F419 Anxiety disorder, unspecified: Secondary | ICD-10-CM | POA: Diagnosis not present

## 2022-06-24 DIAGNOSIS — F32A Depression, unspecified: Secondary | ICD-10-CM | POA: Insufficient documentation

## 2022-06-24 DIAGNOSIS — I509 Heart failure, unspecified: Secondary | ICD-10-CM

## 2022-06-24 DIAGNOSIS — I255 Ischemic cardiomyopathy: Secondary | ICD-10-CM | POA: Insufficient documentation

## 2022-06-24 DIAGNOSIS — G4733 Obstructive sleep apnea (adult) (pediatric): Secondary | ICD-10-CM | POA: Diagnosis present

## 2022-06-24 DIAGNOSIS — Z794 Long term (current) use of insulin: Secondary | ICD-10-CM | POA: Insufficient documentation

## 2022-06-24 DIAGNOSIS — Z7984 Long term (current) use of oral hypoglycemic drugs: Secondary | ICD-10-CM | POA: Diagnosis not present

## 2022-06-24 DIAGNOSIS — E119 Type 2 diabetes mellitus without complications: Secondary | ICD-10-CM | POA: Insufficient documentation

## 2022-06-24 HISTORY — DX: Dyspnea, unspecified: R06.00

## 2022-06-24 HISTORY — PX: DRUG INDUCED ENDOSCOPY: SHX6808

## 2022-06-24 HISTORY — DX: Other specified postprocedural states: R11.2

## 2022-06-24 HISTORY — DX: Other specified postprocedural states: Z98.890

## 2022-06-24 HISTORY — DX: Other complications of anesthesia, initial encounter: T88.59XA

## 2022-06-24 HISTORY — DX: Headache, unspecified: R51.9

## 2022-06-24 LAB — CBC
HCT: 48.3 % (ref 39.0–52.0)
Hemoglobin: 15.6 g/dL (ref 13.0–17.0)
MCH: 28.8 pg (ref 26.0–34.0)
MCHC: 32.3 g/dL (ref 30.0–36.0)
MCV: 89.1 fL (ref 80.0–100.0)
Platelets: 244 10*3/uL (ref 150–400)
RBC: 5.42 MIL/uL (ref 4.22–5.81)
RDW: 12.8 % (ref 11.5–15.5)
WBC: 6.4 10*3/uL (ref 4.0–10.5)
nRBC: 0 % (ref 0.0–0.2)

## 2022-06-24 LAB — GLUCOSE, CAPILLARY
Glucose-Capillary: 188 mg/dL — ABNORMAL HIGH (ref 70–99)
Glucose-Capillary: 167 mg/dL — ABNORMAL HIGH (ref 70–99)
Glucose-Capillary: 130 mg/dL — ABNORMAL HIGH (ref 70–99)
Glucose-Capillary: 124 mg/dL — ABNORMAL HIGH (ref 70–99)

## 2022-06-24 LAB — BASIC METABOLIC PANEL
Anion gap: 9 (ref 5–15)
BUN: 17 mg/dL (ref 6–20)
CO2: 20 mmol/L — ABNORMAL LOW (ref 22–32)
Calcium: 9.4 mg/dL (ref 8.9–10.3)
Chloride: 109 mmol/L (ref 98–111)
Creatinine, Ser: 0.92 mg/dL (ref 0.61–1.24)
GFR, Estimated: >60 mL/min (ref >60)
Glucose, Bld: 190 mg/dL — ABNORMAL HIGH (ref 70–99)
Potassium: 4.2 mmol/L (ref 3.5–5.1)
Sodium: 138 mmol/L (ref 135–145)

## 2022-06-24 SURGERY — DRUG INDUCED SLEEP ENDOSCOPY
Anesthesia: Monitor Anesthesia Care | Laterality: Bilateral

## 2022-06-24 SURGERY — DRUG INDUCED SLEEP ENDOSCOPY
Anesthesia: General | Laterality: Bilateral

## 2022-06-24 MED ORDER — ORAL CARE MOUTH RINSE: 15.0000 mL | Freq: Once | OROMUCOSAL | Status: AC

## 2022-06-24 MED ORDER — PROPOFOL 10 MG/ML IV BOLUS
INTRAVENOUS | Status: DC | PRN
  Administered 2022-06-24 (×3): 15 mg via INTRAVENOUS

## 2022-06-24 MED ORDER — OXYMETAZOLINE HCL 0.05 % NA SOLN
NASAL | Status: DC | PRN
  Administered 2022-06-24: 1 via TOPICAL

## 2022-06-24 MED ORDER — INSULIN ASPART 100 UNIT/ML IJ SOLN
0.0000 [IU] | INTRAMUSCULAR | Status: DC | PRN
  Administered 2022-06-24: 4 [IU] via SUBCUTANEOUS

## 2022-06-24 MED ORDER — CHLORHEXIDINE GLUCONATE 0.12 % MT SOLN
15.0000 mL | Freq: Once | OROMUCOSAL | Status: AC
  Administered 2022-06-24: 15 mL via OROMUCOSAL
  Filled 2022-06-24: qty 15

## 2022-06-24 MED ORDER — CARVEDILOL 12.5 MG PO TABS
ORAL_TABLET | ORAL | Status: AC
  Filled 2022-06-24: qty 2

## 2022-06-24 MED ORDER — OXYMETAZOLINE HCL 0.05 % NA SOLN
NASAL | Status: AC
  Filled 2022-06-24: qty 30

## 2022-06-24 MED ORDER — ONDANSETRON HCL 4 MG/2ML IJ SOLN: 4.0000 mg | Freq: Once | INTRAMUSCULAR | Status: DC | PRN

## 2022-06-24 MED ORDER — CARVEDILOL 12.5 MG PO TABS
25.0000 mg | ORAL_TABLET | Freq: Once | ORAL | Status: AC
  Administered 2022-06-24: 25 mg via ORAL

## 2022-06-24 MED ORDER — LACTATED RINGERS IV SOLN: INTRAVENOUS | Status: DC

## 2022-06-24 MED ORDER — PROPOFOL 500 MG/50ML IV EMUL
INTRAVENOUS | Status: DC | PRN
  Administered 2022-06-24: 40 ug/kg/min via INTRAVENOUS

## 2022-06-24 MED ORDER — LIDOCAINE 2% (20 MG/ML) 5 ML SYRINGE
INTRAMUSCULAR | Status: DC | PRN
  Administered 2022-06-24: 40 mg via INTRAVENOUS

## 2022-06-24 MED ORDER — INSULIN ASPART 100 UNIT/ML IJ SOLN
INTRAMUSCULAR | Status: AC
  Filled 2022-06-24: qty 1

## 2022-06-24 SURGICAL SUPPLY — 12 items
BAG COUNTER SPONGE SURGICOUNT (BAG) IMPLANT
CANISTER SUCT 1200ML W/VALVE (MISCELLANEOUS) ×1 IMPLANT
GLOVE BIO SURGEON STRL SZ7.5 (GLOVE) ×1 IMPLANT
KIT BASIN OR (CUSTOM PROCEDURE TRAY) ×1 IMPLANT
NDL PRECISIONGLIDE 27X1.5 (NEEDLE) IMPLANT
NEEDLE PRECISIONGLIDE 27X1.5 (NEEDLE) IMPLANT
PATTIES SURGICAL .5 X3 (DISPOSABLE) ×1 IMPLANT
SHEET MEDIUM DRAPE 40X70 STRL (DRAPES) IMPLANT
SOL ANTI FOG 6CC (MISCELLANEOUS) ×1 IMPLANT
SYR CONTROL 10ML LL (SYRINGE) IMPLANT
TOWEL GREEN STERILE FF (TOWEL DISPOSABLE) ×1 IMPLANT
TUBE CONNECTING 20X1/4 (TUBING) ×1 IMPLANT

## 2022-06-24 NOTE — Transfer of Care (Signed)
Immediate Anesthesia Transfer of Care Note  Patient: [name] [name].  Procedure(s) Performed: DRUG INDUCED SLEEP ENDOSCOPY (Bilateral)  Patient Location: PACU  Anesthesia Type:MAC  Level of Consciousness: awake, alert , and drowsy  Airway & Oxygen Therapy: Patient Spontanous Breathing  Post-op Assessment: Report given to RN and Post -op Vital signs reviewed and stable  Post vital signs: Reviewed and stable  Last Vitals:  Vitals Value Taken Time  BP    Temp    Pulse    Resp    SpO2      Last Pain:  Vitals:   06/24/22 1056  TempSrc:   PainSc: 0-No pain         Complications: No notable events documented.

## 2022-06-24 NOTE — Brief Op Note (Signed)
06/24/2022  4:15 PM  PATIENT:  [name] [name].  57 y.o. male  PRE-OPERATIVE DIAGNOSIS:  Obstructive Sleep Apnea BMI 35.0-35.9,adult  POST-OPERATIVE DIAGNOSIS:  Obstructive Sleep Apnea BMI 35.0-35.9,adult  PROCEDURE:  Procedure(s): DRUG INDUCED SLEEP ENDOSCOPY (Bilateral)  SURGEON:  Surgeon(s) and Role:    Christia Reading, MD - Primary  PHYSICIAN ASSISTANT:   ASSISTANTS: none   ANESTHESIA:   IV sedation  EBL:  None   BLOOD ADMINISTERED:none  DRAINS: none   LOCAL MEDICATIONS USED:  NONE  SPECIMEN:  No Specimen  DISPOSITION OF SPECIMEN:  N/A  COUNTS:  YES  TOURNIQUET:  * No tourniquets in log *  DICTATION: .Note written in EPIC  PLAN OF CARE: Discharge to home after PACU  PATIENT DISPOSITION:  PACU - hemodynamically stable.   Delay start of Pharmacological VTE agent (>24hrs) due to surgical blood loss or risk of bleeding: no

## 2022-06-24 NOTE — H&P (Signed)
[name] Dutton. is an 57 y.o. male.   Chief Complaint: Sleep apnea HPI: 57 year old male who has been unable to tolerate CPAP.  Past Medical History:  Diagnosis Date   Anxiety    Arthritis    in lower back   Chronic heart failure (HCC)    Complication of anesthesia    with mask and gas   Coronary artery disease    a. 02/2008: anterior STEMI s/p DES to prox LAD, b. Myoview 2012: scar in the anterior wall with mild ischemia and EF 33%, c. neg GXT 06/2011  d. 08/2016: cath with DES to diagonal    Depression with anxiety    Diabetes mellitus    TYPE II   Dyspnea    Fatty liver    H/O ELEVATED LIVER ENZYMES   Headache    High cholesterol    Hyperlipidemia    Hypertension    Ischemic cardiomyopathy 08/14/2016   a. Previous EF 33-35% 2012, b. 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)    Myocardial infarction (HCC)    Noncompliance    Prior hx of med noncompliance   OSA (obstructive sleep apnea)    PONV (postoperative nausea and vomiting)    Sleep apnea     Past Surgical History:  Procedure Laterality Date   CARDIAC CATHETERIZATION     CORONARY ANGIOPLASTY     CORONARY PRESSURE/FFR STUDY N/A 08/21/2016   Procedure: INTRAVASCULAR PRESSURE WIRE/FFR STUDY;  Surgeon: Kathleene Hazel, MD;  [facility];  Service: Cardiovascular;  Laterality: N/A;   CORONARY STENT INTERVENTION N/A 08/21/2016   Procedure: CORONARY STENT INTERVENTION;  Surgeon: Kathleene Hazel, MD;  [facility];  Service: Cardiovascular;  Laterality: N/A;   CYSTOSCOPY N/A 12/04/2019   Procedure: CYSTOSCOPY;  Surgeon: Malen Gauze, MD;  Location: [facility];  Service: Urology;  Laterality: N/A;   LIVER BIOPSY     MI WITH STENTS     NASAL SEPTOPLASTY W/ TURBINOPLASTY N/A 01/25/2019   Procedure: BILATERAL TURBINATE REDUCTION;  Surgeon: Newman Pies, MD;  Location[facility];  Service: ENT;  Laterality: N/A;   NASAL SINUS SURGERY      RIGHT/LEFT HEART CATH AND CORONARY ANGIOGRAPHY N/A 08/21/2016   Procedure: Right/Left Heart Cath and Coronary Angiography;  Surgeon: Kathleene Hazel, MD;  [facility];  Service: Cardiovascular;  Laterality: N/A;   TRANSURETHRAL RESECTION OF PROSTATE N/A 12/04/2019   Procedure: TRANSURETHRAL RESECTION OF THE PROSTATE (TURP);  Surgeon: Malen Gauze, MD;  Location: [facility];  Service: Urology;  Laterality: N/A;    Family History  Problem Relation Age of Onset   Coronary artery disease Other        UNKNOWN   Heart disease Father        CAD s/p CABG   Diabetes Mellitus II Father    Diabetes Mellitus II Mother    Cancer Brother    Social History:  reports that he has never smoked. He has never been exposed to tobacco smoke. He has never used smokeless tobacco. He reports that he does not drink alcohol and does not use drugs.  Allergies: No Known Allergies  Medications Prior to Admission  Medication Sig Dispense Refill   acetaminophen (TYLENOL) 650 MG CR tablet Take 1,300-1,950 mg by mouth daily as needed for pain.     aspirin EC 81 MG tablet Take 81 mg by mouth daily.  atorvastatin (LIPITOR) 80 MG tablet Take 1 tablet by mouth once daily 90 tablet 2   BD PEN NEEDLE NANO 2ND GEN 32G X 4 MM MISC USE AS DIRECTED ONCE DAILY WITH SOLIQUA     carvedilol (COREG) 25 MG tablet Take 25 mg by mouth 2 (two) times daily.     dapagliflozin propanediol (FARXIGA) 10 MG TABS tablet Take by mouth daily.     escitalopram (LEXAPRO) 20 MG tablet Take 20 mg by mouth daily.     GEMTESA 75 MG TABS Take 1 tablet by mouth once daily 30 tablet 0   glipiZIDE (GLUCOTROL XL) 10 MG 24 hr tablet Take 10 mg by mouth daily.     nitroGLYCERIN (NITROSTAT) 0.4 MG SL tablet Place 0.4 mg under the tongue every 5 (five) minutes as needed for chest pain.     sacubitril-valsartan (ENTRESTO) 97-103 MG Take 1 tablet by mouth twice daily 60 tablet 6   solifenacin (VESICARE) 5 MG tablet Take 1 tablet (5  mg total) by mouth daily. 30 tablet 11   tamsulosin (FLOMAX) 0.4 MG CAPS capsule Take 1 capsule (0.4 mg total) by mouth daily after supper. 30 capsule 11   TOUJEO MAX SOLOSTAR 300 UNIT/ML Solostar Pen Inject 80 Units into the skin daily.     AMBULATORY NON FORMULARY MEDICATION 0.2 mLs by Intracavernosal route as needed. Medication Name: Trimix  PGE Pap 30mg  Phent 1mg  5 mL 5   Semaglutide, 2 MG/DOSE, (OZEMPIC, 2 MG/DOSE,) 8 MG/3ML SOPN Inject 2 mg into the skin every Saturday.      Results for orders placed or performed during the hospital encounter of 06/24/22 (from the past 48 hour(s))  Glucose, capillary     Status: Abnormal   Collection Time: 06/24/22 10:31 AM  Result Value Ref Range   Glucose-Capillary 188 (H) 70 - 99 mg/dL    Comment: Glucose reference range applies only to samples taken after fasting for at least 8 hours.   Comment 1 Notify RN    Comment 2 Document in Chart   CBC per protocol     Status: None   Collection Time: 06/24/22 10:53 AM  Result Value Ref Range   WBC 6.4 4.0 - 10.5 K/uL   RBC 5.42 4.22 - 5.81 MIL/uL   Hemoglobin 15.6 13.0 - 17.0 g/dL   HCT 16.1 09.6 - 04.5 %   MCV 89.1 80.0 - 100.0 fL   MCH 28.8 26.0 - 34.0 pg   MCHC 32.3 30.0 - 36.0 g/dL   RDW 40.9 81.1 - 91.4 %   Platelets 244 150 - 400 K/uL   nRBC 0.0 0.0 - 0.2 %    Comment: Performed at [facility], [address] [place], [place] [address]  Basic metabolic panel per protocol     Status: Abnormal   Collection Time: 06/24/22 10:53 AM  Result Value Ref Range   Sodium 138 135 - 145 mmol/L   Potassium 4.2 3.5 - 5.1 mmol/L   Chloride 109 98 - 111 mmol/L   CO2 20 (L) 22 - 32 mmol/L   Glucose, Bld 190 (H) 70 - 99 mg/dL    Comment: Glucose reference range applies only to samples taken after fasting for at least 8 hours.   BUN 17 6 - 20 mg/dL   Creatinine, Ser 6.21 0.61 - 1.24 mg/dL   Calcium 9.4 8.9 - 30.8 mg/dL   GFR, Estimated >65 >78 mL/min    Comment: (NOTE) Calculated using  the CKD-EPI Creatinine Equation (  2021)    Anion gap 9 5 - 15    Comment: Performed at [facility], [address] [place], [place] [address]  Glucose, capillary     Status: Abnormal   Collection Time: 06/24/22 12:41 PM  Result Value Ref Range   Glucose-Capillary 167 (H) 70 - 99 mg/dL    Comment: Glucose reference range applies only to samples taken after fasting for at least 8 hours.  Glucose, capillary     Status: Abnormal   Collection Time: 06/24/22  2:54 PM  Result Value Ref Range   Glucose-Capillary 130 (H) 70 - 99 mg/dL    Comment: Glucose reference range applies only to samples taken after fasting for at least 8 hours.   Comment 1 Notify RN    Comment 2 Document in Chart    No results found.  Review of Systems  All other systems reviewed and are negative.   Blood pressure (!) 151/97, pulse 76, temperature 97.7 F (36.5 C), temperature source Oral, resp. rate 18, height 5\' 10"  (1.778 m), weight 113.4 kg, SpO2 94 %. Physical Exam Constitutional:      Appearance: Normal appearance. He is normal weight.  HENT:     Head: Normocephalic and atraumatic.     Right Ear: External ear normal.     Left Ear: External ear normal.     Nose: Nose normal.     Mouth/Throat:     Mouth: Mucous membranes are moist.     Pharynx: Oropharynx is clear.  Eyes:     Extraocular Movements: Extraocular movements intact.     Conjunctiva/sclera: Conjunctivae normal.     Pupils: Pupils are equal, round, and reactive to light.  Cardiovascular:     Rate and Rhythm: Normal rate.  Pulmonary:     Effort: Pulmonary effort is normal.  Musculoskeletal:     Cervical back: Normal range of motion.  Skin:    General: Skin is warm and dry.  Neurological:     General: No focal deficit present.     Mental Status: He is alert.  Psychiatric:        Mood and Affect: Mood normal.        Behavior: Behavior normal.        Thought Content: Thought content normal.        Judgment: Judgment normal.       Assessment/Plan Obstructive sleep apnea and BMI 35.87.  To OR for sleep endoscopy.  Christia Reading, MD 06/24/2022, 3:29 PM

## 2022-06-24 NOTE — Anesthesia Procedure Notes (Signed)
Procedure Name: MAC Date/Time: 06/24/2022 4:04 PM  Performed by: Aundria Rud, CRNAPre-anesthesia Checklist: Patient identified, Emergency Drugs available, Suction available and Patient being monitored Patient Re-evaluated:Patient Re-evaluated prior to induction Oxygen Delivery Method: Simple face mask Preoxygenation: Pre-oxygenation with 100% oxygen Induction Type: IV induction Placement Confirmation: positive ETCO2 and CO2 detector Dental Injury: Teeth and Oropharynx as per pre-operative assessment

## 2022-06-24 NOTE — Op Note (Signed)
Preop diagnosis: Obstructive sleep apnea Postop diagnosis: same Procedure: Drug-induced sleep endoscopy Surgeon: Jenne Pane Anesth: IV sedation Compl: None Findings: There is 100% anterior-posterior collapse at the velum making him a candidate for hypoglossal nerve stimulator placement.  There was not much collapse at the tongue base. Description:  After discussing risks, benefits, and alternatives, the patient was brought to the operative suite and placed on the operative table in the supine position.  Anesthesia was induced and the patient was given light sedation to simulate natural sleep. When the proper level was reached, an Afrin-soaked pledget was placed in the right nasal passage for a couple of minutes and then removed.  The fiberoptic laryngoscope was then passed to view the pharynx and larynx.  Findings are noted above and the exam was recorded.  After completion, the scope was removed and the patient was returned to anesthesia for wakeup and was moved to the recovery room in stable condition.

## 2022-06-25 ENCOUNTER — Encounter (HOSPITAL_COMMUNITY): Payer: Self-pay | Admitting: Otolaryngology

## 2022-06-25 NOTE — Anesthesia Postprocedure Evaluation (Signed)
Anesthesia Post Note  Patient: [name] [name].  Procedure(s) Performed: DRUG INDUCED SLEEP ENDOSCOPY (Bilateral)     Patient location during evaluation: PACU Anesthesia Type: MAC Level of consciousness: awake and alert Pain management: pain level controlled Vital Signs Assessment: post-procedure vital signs reviewed and stable Respiratory status: spontaneous breathing Cardiovascular status: stable Anesthetic complications: no   No notable events documented.  Last Vitals:  Vitals:   06/24/22 1630 06/24/22 1645  BP: 119/84 (!) 129/91  Pulse: 68 67  Resp: 17 17  Temp:  36.5 C  SpO2: 92% 93%    Last Pain:  Vitals:   06/24/22 1056  TempSrc:   PainSc: 0-No pain                 Lewie Loron

## 2022-07-17 ENCOUNTER — Other Ambulatory Visit: Payer: Self-pay | Admitting: Cardiology

## 2022-07-23 ENCOUNTER — Other Ambulatory Visit: Payer: Self-pay | Admitting: Urology

## 2022-08-06 ENCOUNTER — Ambulatory Visit: Payer: 59 | Admitting: Pulmonary Disease

## 2022-08-06 ENCOUNTER — Encounter: Payer: Self-pay | Admitting: Pulmonary Disease

## 2022-08-06 VITALS — BP 130/85 | HR 88 | Ht 70.0 in | Wt 246.0 lb

## 2022-08-06 DIAGNOSIS — I255 Ischemic cardiomyopathy: Secondary | ICD-10-CM | POA: Diagnosis not present

## 2022-08-06 DIAGNOSIS — G4733 Obstructive sleep apnea (adult) (pediatric): Secondary | ICD-10-CM | POA: Diagnosis not present

## 2022-08-06 NOTE — Assessment & Plan Note (Signed)
Would need optimal management of CHF to reduce central apneas.  Unfortunately not a candidate for BiPAP or advanced PAP therapies due to low EF and poor tolerance

## 2022-08-06 NOTE — Telephone Encounter (Signed)
Patient here for OV today, states he has still not seen ENT for [facility] set up.   Could you please follow up on this (or send to who can)? Thanks!

## 2022-08-06 NOTE — Patient Instructions (Signed)
  X schedule sleep study @ [facility] to see if we can  qualify you for inspire  Get back with Dr Wyline Mood

## 2022-08-06 NOTE — Progress Notes (Signed)
   Subjective:    Patient ID: [name] [name]., male    DOB: Aug 21, 1965, 57 y.o.   MRN: [id number]  HPI  57 yo with ischemic cardiomyopathy for  FU of OSA -Treatment emergent centrals He was unable to tolerate CPAP He went to the dentist and feels he would not be able to use a mouthguard. 07/2021 EF was 25 to 30%      PMH - AW- STEMI 02/2008, received DES to LAD & D2 in 2018  LVEF  25- 35%, not ready for ICD -Diabetes type 2 for 20 years, on insulin for 1 year  57-month follow-up visit. After his last visit we referred him to ENT for hypoglossal nerve stimulation, he is qualified by DISE but unfortunately significant central and mixed apneas on his home sleep testing and this was turned down by insurance.  He continues to remain sleepy and tired.  He reports drowsy driving.  He reports headaches during the day. Reviewed cardiology office visit, ICD is being contemplated.  He has missed his appointment with the EP  Significant tests/ events reviewed   NPSG 08/2018 - wt 234 lbs -TST 337 minutes, RDI 17/hour, AHI 15.5/hour, no supine sleep, lowest desaturation 83%, 3 REM periods   CPAP titration 01/2021 >> sub opimal ? 10 cm, treatment emergent centrals +  PLMs+  01/2022 HST >> AHI 36/h, total of 1311, 31 centrals, 62 mixed apneas, lowest desaturation  67%  Review of Systems neg for any significant sore throat, dysphagia, itching, sneezing, nasal congestion or excess/ purulent secretions, fever, chills, sweats, unintended wt loss, pleuritic or exertional cp, hempoptysis, orthopnea pnd or change in chronic leg swelling. Also denies presyncope, palpitations, heartburn, abdominal pain, nausea, vomiting, diarrhea or change in bowel or urinary habits, dysuria,hematuria, rash, arthralgias, visual complaints, headache, numbness weakness or ataxia.      Objective:   Physical Exam  Gen. Pleasant, obese, in no distress ENT - no lesions, no post nasal drip Neck: No JVD, no thyromegaly, no  carotid bruits Lungs: no use of accessory muscles, no dullness to percussion, decreased without rales or rhonchi  Cardiovascular: Rhythm regular, heart sounds  normal, no murmurs or gallops, no peripheral edema Musculoskeletal: No deformities, no cyanosis or clubbing , no tremors       Assessment & Plan:

## 2022-08-06 NOTE — Assessment & Plan Note (Signed)
Unfortunately he had more than 25% central and mixed apneas on his home sleep test.  I personally reviewed the study and rescored it but It is still high. I think the best way forward would be to schedule in PSG and revisit the issue and see if he has significant central/mixed apneas and whether he could still be a candidate for hypoglossal nerve stimulation.  He is cleared DISE already  Central apneas seem to be related to heart failure so optimal treatment of heart failure would also be helpful. He is very symptomatic but has unfortunately been unable to tolerate PAP or dental appliance

## 2022-08-15 ENCOUNTER — Other Ambulatory Visit: Payer: Self-pay | Admitting: Cardiology

## 2022-08-22 ENCOUNTER — Other Ambulatory Visit: Payer: Self-pay | Admitting: Urology

## 2022-08-27 ENCOUNTER — Ambulatory Visit: Payer: 59 | Admitting: Cardiology

## 2022-08-31 ENCOUNTER — Ambulatory Visit: Payer: 59 | Attending: Pulmonary Disease | Admitting: Pulmonary Disease

## 2022-08-31 DIAGNOSIS — G4761 Periodic limb movement disorder: Secondary | ICD-10-CM | POA: Insufficient documentation

## 2022-08-31 DIAGNOSIS — I493 Ventricular premature depolarization: Secondary | ICD-10-CM | POA: Diagnosis not present

## 2022-08-31 DIAGNOSIS — G4736 Sleep related hypoventilation in conditions classified elsewhere: Secondary | ICD-10-CM | POA: Insufficient documentation

## 2022-08-31 DIAGNOSIS — G4733 Obstructive sleep apnea (adult) (pediatric): Secondary | ICD-10-CM | POA: Insufficient documentation

## 2022-08-31 DIAGNOSIS — R0683 Snoring: Secondary | ICD-10-CM | POA: Diagnosis present

## 2022-09-04 ENCOUNTER — Telehealth: Payer: Self-pay | Admitting: Pulmonary Disease

## 2022-09-04 DIAGNOSIS — G4733 Obstructive sleep apnea (adult) (pediatric): Secondary | ICD-10-CM

## 2022-09-04 NOTE — Procedures (Signed)
Patient Name: [name] [name], [name] [name] Date: 08/31/2022 Gender: Male D.O.B: 1965/09/16 Age (years): 12 Referring Provider: Cyril Mourning MD, ABSM Height (inches): 70 Interpreting Physician: Cyril Mourning MD, ABSM Weight (lbs): 245 RPSGT: Alfonso Ellis BMI: 35 MRN: [id number] Neck Size: 20.50 <br> <br> CLINICAL INFORMATION Sleep Study Type: NPSG    Indication for sleep study: Fatigue, Snoring, heart failure , central apneas noted on home study    Epworth Sleepiness Score: 21     Most recent polysomnogram dated 09/07/2018 revealed an AHI of 15.5/h and RDI of 17.1/h. Most recent titration study dated 02/03/2021 revealed an AHI of 9.6/h. SLEEP STUDY TECHNIQUE As per the AASM Manual for the Scoring of Sleep and Associated Events v2.3 (April 2016) with a hypopnea requiring 4% desaturations.  The channels recorded and monitored were frontal, central and occipital EEG, electrooculogram (EOG), submentalis EMG (chin), nasal and oral airflow, thoracic and abdominal wall motion, anterior tibialis EMG, snore microphone, electrocardiogram, and pulse oximetry.  MEDICATIONS Medications self-administered by patient taken the night of the study : N/A  SLEEP ARCHITECTURE The study was initiated at 11:07:47 PM and ended at 5:33:37 AM.  Sleep onset time was 6.2 minutes and the sleep efficiency was 88.4%. The total sleep time was 341.1 minutes.  Stage REM latency was 61.5 minutes.  The patient spent 4.69% of the night in stage N1 sleep, 75.81% in stage N2 sleep, 12.61% in stage N3 and 6.9% in REM.  Alpha intrusion was absent.  Supine sleep was 5.89%.  RESPIRATORY PARAMETERS The overall apnea/hypopnea index (AHI) was 29.7 per hour. There were 11 total apneas, including 2 obstructive, 3 central and 6 mixed apneas. There were 158 hypopneas and 0 RERAs.  The AHI during Stage REM sleep was 23.0 per hour.  AHI while supine was 56.7 per hour.  The mean oxygen saturation was 90.28%. The minimum SpO2  during sleep was 63.00%.  loud snoring was noted during this study.  CARDIAC DATA The 2 lead EKG demonstrated sinus rhythm. The mean heart rate was 80.83 beats per minute. Other EKG findings include: PVCs.   LEG MOVEMENT DATA The total PLMS were 419 with a resulting PLMS index of 73.70. Associated arousal with leg movement index was 5.6 .  IMPRESSIONS - Moderate obstructive sleep apnea occurred during this study (AHI = 29.7/h). - No significant central sleep apnea occurred during this study (CAI = 0.5/h). - Severe oxygen desaturation was noted during this study (Min O2 = 63.00%). - The patient snored with loud snoring volume. - EKG findings include PVCs. - Severe periodic limb movements of sleep occurred during the study. Associated arousals were significant.   DIAGNOSIS - Obstructive Sleep Apnea (G47.33) - Periodic Limb Movement During Sleep (G47.61) - Nocturnal Hypoxemia (G47.36)   RECOMMENDATIONS - Therapeutic CPAP titration to determine optimal pressure required to alleviate sleep disordered breathing. Consider evaluation for hypoglossal nerve stimulator therapy if he is unable to tolerate PAP therapy - Avoid alcohol, sedatives and other CNS depressants that may worsen sleep apnea and disrupt normal sleep architecture. - Sleep hygiene should be reviewed to assess factors that may improve sleep quality. - Weight management and regular exercise should be initiated or continued if appropriate.  [Electronically signed] 09/04/2022 01:17 PM  Cyril Mourning MD, ABSM Diplomate, American Board of Sleep Medicine NPI: [id number]

## 2022-09-04 NOTE — Telephone Encounter (Signed)
Please let him know his PSG showed severe OSA, no sig central apneas We will forward to dr bates & they can proceed with inspire approval

## 2022-09-08 ENCOUNTER — Encounter: Payer: 59 | Admitting: Pulmonary Disease

## 2022-09-10 NOTE — Telephone Encounter (Signed)
I have notified the patient and placed the referral to Dr. Jenne Pane for an inspire devise.  Nothing further needed.

## 2022-09-10 NOTE — Telephone Encounter (Signed)
Patient is calling to receive his sleep study results. He also wants know if he'll be a candidate for the inspire procedure.

## 2022-09-15 ENCOUNTER — Ambulatory Visit (INDEPENDENT_AMBULATORY_CARE_PROVIDER_SITE_OTHER): Payer: 59 | Admitting: Otolaryngology

## 2022-09-15 ENCOUNTER — Encounter (INDEPENDENT_AMBULATORY_CARE_PROVIDER_SITE_OTHER): Payer: Self-pay | Admitting: Otolaryngology

## 2022-09-15 VITALS — BP 136/92 | HR 96 | Ht 70.0 in | Wt 246.0 lb

## 2022-09-15 DIAGNOSIS — R0981 Nasal congestion: Secondary | ICD-10-CM | POA: Diagnosis not present

## 2022-09-15 DIAGNOSIS — J3089 Other allergic rhinitis: Secondary | ICD-10-CM

## 2022-09-15 DIAGNOSIS — K219 Gastro-esophageal reflux disease without esophagitis: Secondary | ICD-10-CM | POA: Diagnosis not present

## 2022-09-15 DIAGNOSIS — G4733 Obstructive sleep apnea (adult) (pediatric): Secondary | ICD-10-CM | POA: Diagnosis not present

## 2022-09-15 MED ORDER — FLUTICASONE PROPIONATE 50 MCG/ACT NA SUSP: 2.0000 | Freq: Every day | NASAL | 6 refills | Status: AC

## 2022-09-15 MED ORDER — DESLORATADINE 5 MG PO TABS
5.0000 mg | ORAL_TABLET | Freq: Every day | ORAL | 3 refills | Status: DC
Start: 2022-09-15 — End: 2023-08-31

## 2022-09-15 NOTE — Progress Notes (Signed)
SUBJECTIVE:  Chief Complaint: severe OSA, CPAP intolerance   Referring sleep doc: unclear   HPI: Pt is a 57 y.o. year old male with a h/o CHF, prior MI x 2 ischemic cardiomyopathy, last EF 24% on NM myocardial scan, 10/2021, severe OSA and CPAP intolerance, previously seen by Dr Jenne Pane, [place], and had [facility] Therapy evaluation, here to discuss [facility] Implant Surgery.  He reports daily fatigue and feeling sleepy during the day, tried fullface mask and nasal mask in the past and still cannot tolerate CPAP.  Currently noncompliant.  Initially denied for inspire implant due to evidence of significant number of central apneas on home sleep study, but recently had repeat sleep study done in the lab, with no significant number of central apneas.  His cardiac history is notable for dilated ischemic cardiomyopathy and history of 2 Mis, last nuclear medicine cardiac scan in 2023 with EF of 24%.  He reports that he is currently being evaluated for pacemaker defibrillator placement, his cardiologist, and scheduled to see him in the next 2 weeks.  Reviewed records also show that he was previously treated by Dr. Annalee Genta for history of severe nasal congestion with an office inferior turbinate ablation performed twice in 2020.  At that time he was deemed unsafe for general anesthesia, and that was the only procedure he underwent in the office.  He continues to have symptoms of nasal blockage and nasal congestion.  Not on medications or nasal sprays right now.   MI - 2010 and 2018, [facility] in [place], [place] no blood thinners on baby ASA and  He had prostate surgery 2021.   Records Review:   Note by Dr Vassie Loll, Pulm  57 yo with ischemic cardiomyopathy for  FU of OSA -Treatment emergent centrals He was unable to tolerate CPAP He went to the dentist and feels he would not be able to use a mouthguard. 07/2021 EF was 25 to 30%    He continues to remain sleepy and tired.  He reports drowsy driving.  He reports headaches during  the day. Reviewed cardiology office visit, ICD is being contemplated.  He has missed his appointment with the EP   Significant tests/ events reviewed   NPSG 08/2018 - wt 234 lbs -TST 337 minutes, RDI 17/hour, AHI 15.5/hour, no supine sleep, lowest desaturation 83%, 3 REM periods   CPAP titration 01/2021 >> sub opimal ? 10 cm, treatment emergent centrals +  PLMs+   01/2022 HST >> AHI 36/h, total of 1311, 31 centrals, 62 mixed apneas, lowest desaturation  67%      PMH - AW- STEMI 02/2008, received DES to LAD & D2 in 2018  LVEF  25- 35%, not ready for ICD -Diabetes type 2 for 20 years, on insulin for 1 year   56-month follow-up visit. After his last visit we referred him to ENT for hypoglossal nerve stimulation, he is qualified by DISE but unfortunately significant central and mixed apneas on his home sleep testing and this was turned down by insurance.  Note by Dr Jenne Pane, ENT 06/02/22 - seen for [facility] Consultation   Op Note by Dr Jenne Pane 06/24/22 - DISE - candidate for Inspire based on the Op Report   Discharge summary after admission 08/23/22 [name] [name] is a 57 y.o. male with a history of CAD s/p DES to LAD (2010), DMT2, OSA, HLD, HTN, ischemic CM, and recently diagnosed LV thrombus who presented to [facility] on 08/21/16 for planned [facility].    He has a history of anterior  STEMI in 02/2008 and received DES to LAD. LVEF at that time was 35%. 2012 myoview anterior scar with mild ischemia. 06/2011 echo showed improvement in LVEF to 45-50%. In 06/2011, he had a GXT with no ischemia   He was recently seen in the office by Dr. Wyline Mood for worsening CP and SOB. 2D ECHO in 07/2016 showed LVEF down to 30-35%, diffuse hypokinesis with akinesis of anerior and anteroseptal walls as well as evidence of LV thrombus. Plan was to start coumadin after L/RHC which was set up for 08/21/16.  Past Medical/Surgical History He  His  has a past surgical history that includes Nasal sinus surgery; Liver biopsy; MI WITH STENTS; RIGHT/LEFT  HEART CATH AND CORONARY ANGIOGRAPHY (N/A, 08/21/2016); CORONARY STENT INTERVENTION (N/A, 08/21/2016); CORONARY PRESSURE/FFR STUDY (N/A, 08/21/2016); Cardiac catheterization; Nasal septoplasty w/ turbinoplasty (N/A, 01/25/2019); Coronary angioplasty; Transurethral resection of prostate (N/A, 12/04/2019); Cystoscopy (N/A, 12/04/2019); and Drug induced endoscopy (Bilateral, 06/24/2022).  Past Family/Social History His family history includes Cancer in his brother; Coronary artery disease in an other family member; Diabetes Mellitus II in his father and mother; Heart disease in his father. He  reports that he has never smoked. He has never been exposed to tobacco smoke. He has never used smokeless tobacco. He reports that he does not drink alcohol and does not use drugs.  Medications/Allergies/Immunizations His current medication(s) include:  @CMEDLISTP @ Allergies: Patient has no known allergies., Immunizations:  Immunization History  Administered Date(s) Administered   Influenza,inj,Quad PF,6+ Mos 10/28/2019   Tdap 07/27/2017    Review of Systems  ROS: Constitutional: Negative for fever, weight loss and weight gain. Cardiovascular: Negative for chest pain and dyspnea on exertion. Respiratory: Is not experiencing shortness of breath at rest. Gastrointestinal: Negative for nausea and vomiting. Neurological: Negative for headaches. Psychiatric: The patient is not nervous/anxious  OBJECTIVE:  Physical Exam Vitals:  Vitals:   09/15/22 0801  BP: (!) 136/92  Pulse: 96  SpO2: 96%   General:  Well-developed, well-nourished, no apparent distress BMI: Body mass index is 35.3 kg/m. Communication and Voice:  raspy Respiratory Respiratory effort:  Equal inspiration and expiration without stridor Auscultation:  Equal breath sounds bilaterally Cardiovascular Heart:  regular rate and rhythm Peripheral Vascular:  Warm extremities with equal pulses Eyes: No nystagmus with equal extraocular motion  bilaterally Neuro/Psych/Balance: Patient oriented to person, place, and time;  Appropriate mood and affect;  Gait is intact with no imbalance; Cranial nerves I-XII are intact Head and Face Inspection:  Normocephalic and atraumatic without mass or lesion Palpation:  Facial skeleton intact without bony stepoffs Salivary Glands:  No masses or tenderness Facial Strength:  Facial motility symmetric and full bilaterally ENT Pinna:  External ear intact and fully developed bilaterally External canal:  Canal is patent with intact skin bilaterally Tympanic Membrane:  Clear and mobile bilaterally Hearing: Midline Weber, pos Rinne bilaterally, normal clinical speech reception threshold (whispered voice, finger rub) External nose:  No scar or anatomic deformity Internal Nose:  Septum intact and midline.  No edema, polyp, or rhinorrhea. Lips, Teeth, and gums:  Mucosa and teeth intact and viable TMJ:  No pain to palpation with full mobility Oral cavity/oropharynx:  No erythema or exudate, 1+ tonsils Tongue/palate position: Friedman 4 Nasopharynx:  No mass or lesion with intact mucosa Hypopharynx:  Intact mucosa without pooling of secretions Larynx:  Full true vocal cord mobility without lesion or mass Neck Neck and Trachea:  Midline trachea without mass or lesion Thyroid:  No mass or nodularity Lymphatics:  No lymphadenopathy  Preoperative diagnosis: OSA  Postoperative diagnosis:   Same  Procedure: Flexible fiberoptic laryngoscopy  Surgeon: Ashok Croon, MD  Anesthesia: Topical lidocaine and Afrin Complications: None Condition is stable throughout exam  Indications and consent:  The patient presents to the clinic with Indirect laryngoscopy view was incomplete. Thus it was recommended that they undergo a flexible fiberoptic laryngoscopy. All of the risks, benefits, and potential complications were reviewed with the patient preoperatively and verbal informed consent was  obtained.  Procedure: The patient was seated upright in the clinic. Topical lidocaine and Afrin were applied to the nasal cavity. After adequate anesthesia had occurred, I then proceeded to pass the flexible telescope into the nasal cavity. The nasal cavity was patent without rhinorrhea or polyp. The nasopharynx was also patent without mass or lesion. The base of tongue was visualized and was normal. There were no signs of pooling of secretions in the piriform sinuses. The true vocal folds were mobile bilaterally. There were no signs of glottic or supraglottic mucosal lesion or mass. There was moderate interarytenoid pachydermia and post cricoid edema. The telescope was then slowly withdrawn and the patient tolerated the procedure throughout.   PROCEDURE NOTE: nasal endoscopy  Preoperative diagnosis: chronic sinusitis symptoms  Postoperative diagnosis: same  Procedure: Diagnostic nasal endoscopy (96295)  Surgeon: Ashok Croon, M.D.  Anesthesia: Topical lidocaine and Afrin  H&P REVIEW: The patient's history and physical were reviewed today prior to procedure. All medications were reviewed and updated as well. Complications: None Condition is stable throughout exam Indications and consent: The patient presents with symptoms of chronic sinusitis not responding to previous therapies. All the risks, benefits, and potential complications were reviewed with the patient preoperatively and informed consent was obtained. The time out was completed with confirmation of the correct procedure.   Procedure: The patient was seated upright in the clinic. Topical lidocaine and Afrin were applied to the nasal cavity. After adequate anesthesia had occurred, the rigid nasal endoscope was passed into the nasal cavity. The nasal mucosa, turbinates, septum, and sinus drainage pathways were visualized bilaterally. This revealed no purulence or significant secretions that might be cultured. There were no polyps or  sites of significant inflammation. The mucosa was intact and there was no crusting present. The scope was then slowly withdrawn and the patient tolerated the procedure well. There were no complications or blood loss.   Studies Reviewed:  Attended PSG 08/31/22 Weight (lbs): 245 RPSGT: Alfonso Ellis BMI: 35 MRN: [id number] Neck Size: 20.50 <br> <br> CLINICAL INFORMATION Sleep Study Type: NPSG     Indication for sleep study: Fatigue, Snoring, heart failure , central apneas noted on home study   Epworth Sleepiness Score: 21    Most recent polysomnogram dated 09/07/2018 revealed an AHI of 15.5/h and RDI of 17.1/h. Most recent titration study dated 02/03/2021 revealed an AHI of 9.6/h. SLEEP STUDY TECHNIQUE As per the AASM Manual for the Scoring of Sleep and Associated Events v2.3 (April 2016) with a hypopnea requiring 4% desaturations.   The channels recorded and monitored were frontal, central and occipital EEG, electrooculogram (EOG), submentalis EMG (chin), nasal and oral airflow, thoracic and abdominal wall motion, anterior tibialis EMG, snore microphone, electrocardiogram, and pulse oximetry.   MEDICATIONS Medications self-administered by patient taken the night of the study : N/A   SLEEP ARCHITECTURE The study was initiated at 11:07:47 PM and ended at 5:33:37 AM.   Sleep onset time was 6.2 minutes and the sleep efficiency was 88.4%. The total sleep time was 341.1 minutes.  Stage REM latency was 61.5 minutes.   The patient spent 4.69% of the night in stage N1 sleep, 75.81% in stage N2 sleep, 12.61% in stage N3 and 6.9% in REM.   Alpha intrusion was absent.   Supine sleep was 5.89%.   RESPIRATORY PARAMETERS The overall apnea/hypopnea index (AHI) was 29.7 per hour. There were 11 total apneas, including 2 obstructive, 3 central and 6 mixed apneas. There were 158 hypopneas and 0 RERAs.   The AHI during Stage REM sleep was 23.0 per hour.   AHI while supine was 56.7 per hour.    The mean oxygen saturation was 90.28%. The minimum SpO2 during sleep was 63.00%.   loud snoring was noted during this study.   CARDIAC DATA The 2 lead EKG demonstrated sinus rhythm. The mean heart rate was 80.83 beats per minute. Other EKG findings include: PVCs.     LEG MOVEMENT DATA The total PLMS were 419 with a resulting PLMS index of 73.70. Associated arousal with leg movement index was 5.6 .   IMPRESSIONS - Moderate obstructive sleep apnea occurred during this study (AHI = 29.7/h). - No significant central sleep apnea occurred during this study (CAI = 0.5/h). - Severe oxygen desaturation was noted during this study (Min O2 = 63.00%). - The patient snored with loud snoring volume. - EKG findings include PVCs. - Severe periodic limb movements of sleep occurred during the study. Associated arousals were significant.     DIAGNOSIS - Obstructive Sleep Apnea (G47.33) - Periodic Limb Movement During Sleep (G47.61) - Nocturnal Hypoxemia (G47.36)    ASSESSMENT/PLAN: Encounter Diagnoses  Name Primary?   Severe obstructive sleep apnea [G47.33] Yes   Gastroesophageal reflux disease without esophagitis [K21.9]    Environmental and seasonal allergies [J30.89]    Nasal congestion [R21.31]    57 year old male with history of ischemic cardiomyopathy reduced EF 25% being followed by cardiology and EP, currently pacemaker/defibrillator implant being discussed, here for [facility] Consultation.  Based on record review he already passed DISE performed by Dr. Jenne Pane few weeks ago, but initial submission to insurance company was denied due to evidence of central apneas on home sleep study.  He just completed attended polysomnography 2 weeks ago which revealed no significant number of central events and based on my personal review of results, he would be a candidate for inspire therapy.  I am concerned about his significant cardiac history and reduced EF, he is not clear as to when he was advised to  have pacemaker defibrillator implantation, being followed by cardiology and EP and has an appointment coming up.  I discussed with the patient that although he is a candidate based on DISE and most recent sleep study, he would have to be cleared by cardiology to undergo inspire implant surgery, and if he does require pacemaker or defibrillator surgery he would need to get that done first.  We also discussed chronic nasal congestion, nasal endoscopy today demonstrated patent nasal passages and his inferior turbinates appear to be reduced, which I suspect is due to procedures in the past that were done by Dr. Annalee Genta.  Overall, nasal passages were patent. He did have mild nasal congestion, and I advised the patient to initiate Flonase and try antihistamine at night.  Also advised to hold antihistamine if he has worsening of baseline daytime fatigue.   He will see cardiology as scheduled and return for follow-up after cardiac clearance has been obtained.  I also advised the patient that he can see Dr. Jenne Pane for final  inspire implant surgery, since he was the initial inspire surgeon who performed evaluation and DISE.  All questions have been answered.   - start Flonase and Clarinex for nasal congestion  - see Cardiology for clearance prior to [facility]  - return after clearance by Cardiology to discuss [facility] Implant Surgery

## 2022-09-15 NOTE — Patient Instructions (Signed)
-   start Flonase and Clarinex (allergy pill) - see Cardiology for clearance prior to [facility]  - return after clearance by Cardiology to discuss [facility] Implant Surgery

## 2022-09-16 ENCOUNTER — Institutional Professional Consult (permissible substitution) (INDEPENDENT_AMBULATORY_CARE_PROVIDER_SITE_OTHER): Payer: 59 | Admitting: Otolaryngology

## 2022-09-17 ENCOUNTER — Telehealth: Payer: Self-pay | Admitting: Cardiology

## 2022-09-17 ENCOUNTER — Ambulatory Visit: Payer: 59 | Admitting: Surgery

## 2022-09-17 ENCOUNTER — Encounter: Payer: Self-pay | Admitting: Surgery

## 2022-09-17 DIAGNOSIS — I5040 Unspecified combined systolic (congestive) and diastolic (congestive) heart failure: Secondary | ICD-10-CM

## 2022-09-17 NOTE — Progress Notes (Signed)
[facility] [place]  Patient name: [name] [name]. MRN: [id number] DOB: 1965/02/09 Sex: male      Virtual Visit via Telephone Note   Because of [name] PATTISON Jr.'s co-morbid illnesses, he is at least at moderate risk for complications without adequate follow up.  This format is felt to be most appropriate for this patient at this time.  The patient did not have access to video technology/had technical difficulties with video requiring transitioning to audio format only (telephone).  All issues noted in this document were discussed and addressed.  No physical exam could be performed with this format.  Patient Location: Home Provider Location: Office/Clinic     REASON FOR APPOINTMENT:    Barostim eval  HISTORY OF PRESENT ILLNESS:   [name] [name]. is a 57 y.o. male, who is here for discussions regarding Barostim device implant.  He was scheduled for a bat wire research study implant but this did not happen.  He has been approved for commercial implant.  The patient has NYHA III-3B symptoms.  He has shortness of breath with daily activities and short walking distances.  His ejection fraction is 25 to 30% despite goal-directed medical therapy.  He is on a statin for hypercholesterolemia.  He does not have a ICD in place.     PAST MEDICAL HISTORY    Past Medical History:  Diagnosis Date   Anxiety    Arthritis    in lower back   Chronic heart failure (HCC)    Complication of anesthesia    with mask and gas   Coronary artery disease    a. 02/2008: anterior STEMI s/p DES to prox LAD, b. Myoview 2012: scar in the anterior wall with mild ischemia and EF 33%, c. neg GXT 06/2011  d. 08/2016: cath with DES to diagonal    Depression with anxiety    Diabetes mellitus    TYPE II   Dyspnea    Fatty liver    H/O ELEVATED LIVER ENZYMES   Headache    High cholesterol    Hyperlipidemia    Hypertension    Ischemic cardiomyopathy 08/14/2016   a.  Previous EF 33-35% 2012, b. 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)    Myocardial infarction (HCC)    Noncompliance    Prior hx of med noncompliance   OSA (obstructive sleep apnea)    PONV (postoperative nausea and vomiting)    Sleep apnea      FAMILY HISTORY   Family History  Problem Relation Age of Onset   Coronary artery disease Other        UNKNOWN   Heart disease Father        CAD s/p CABG   Diabetes Mellitus II Father    Diabetes Mellitus II Mother    Cancer Brother     SOCIAL HISTORY:   Social History   Socioeconomic History   Marital status: Legally Separated    Spouse name: Not on file   Number of children: Not on file   Years of education: Not on file   Highest education level: Not on file  Occupational History   Not on file  Tobacco Use   Smoking status: Never    Passive exposure: Never   Smokeless tobacco: Never  Vaping Use   Vaping status: Never Used  Substance and Sexual  Activity   Alcohol use: Never   Drug use: Never   Sexual activity: Not on file  Other Topics Concern   Not on file  Social History Narrative   Not on file   Social Determinants of Health   Financial Resource Strain: Low Risk  (03/24/2022)   Received from [facility], [facility]   Overall Financial Resource Strain (CARDIA)    Difficulty of Paying Living Expenses: Not hard at all  Food Insecurity: No Food Insecurity (03/24/2022)   Received from [facility], [facility]   Hunger Vital Sign    Worried About Running Out of Food in the Last Year: Never true    Ran Out of Food in the Last Year: Never true  Transportation Needs: No Transportation Needs (03/24/2022)   Received from [facility], [facility]   PRAPARE - Transportation    Lack of Transportation (Medical): No    Lack of Transportation (Non-Medical): No  Physical Activity: Not on file  Stress: Not on file  Social Connections: Unknown (12/01/2021)   Received  from [facility], [facility]   Social Network    Social Network: Not on file  Intimate Partner Violence: Unknown (12/01/2021)   Received from [facility], [facility]   HITS    Physically Hurt: Not on file    Insult or Talk Down To: Not on file    Threaten Physical Harm: Not on file    Scream or Curse: Not on file    ALLERGIES:    No Known Allergies  CURRENT MEDICATIONS:    Current Outpatient Medications  Medication Sig Dispense Refill   acetaminophen (TYLENOL) 650 MG CR tablet Take 1,300-1,950 mg by mouth daily as needed for pain.     AMBULATORY NON FORMULARY MEDICATION 0.2 mLs by Intracavernosal route as needed. Medication Name: Trimix  PGE Pap 30mg  Phent 1mg  5 mL 5   aspirin EC 81 MG tablet Take 81 mg by mouth daily.     atorvastatin (LIPITOR) 80 MG tablet Take 1 tablet by mouth once daily 90 tablet 1   BD PEN NEEDLE NANO 2ND GEN 32G X 4 MM MISC USE AS DIRECTED ONCE DAILY WITH SOLIQUA     carvedilol (COREG) 25 MG tablet Take 25 mg by mouth 2 (two) times daily.     dapagliflozin propanediol (FARXIGA) 10 MG TABS tablet Take by mouth daily.     desloratadine (CLARINEX) 5 MG tablet Take 1 tablet (5 mg total) by mouth daily. 90 tablet 3   escitalopram (LEXAPRO) 20 MG tablet Take 20 mg by mouth daily.     fluticasone (FLONASE) 50 MCG/ACT nasal spray Place 2 sprays into both nostrils daily. 16 g 6   GEMTESA 75 MG TABS Take 1 tablet by mouth once daily 30 tablet 0   glipiZIDE (GLUCOTROL XL) 10 MG 24 hr tablet Take 10 mg by mouth daily.     nitroGLYCERIN (NITROSTAT) 0.4 MG SL tablet Place 0.4 mg under the tongue every 5 (five) minutes as needed for chest pain.     sacubitril-valsartan (ENTRESTO) 97-103 MG Take 1 tablet by mouth twice daily 60 tablet 2   Semaglutide, 2 MG/DOSE, (OZEMPIC, 2 MG/DOSE,) 8 MG/3ML SOPN Inject 2 mg into the skin every Saturday.     solifenacin (VESICARE) 5 MG tablet Take 1 tablet (5 mg total) by mouth daily. 30 tablet 11   tamsulosin  (FLOMAX) 0.4 MG CAPS capsule Take 1 capsule (0.4 mg total) by mouth daily after supper. 30 capsule 11  TOUJEO MAX SOLOSTAR 300 UNIT/ML Solostar Pen Inject 80 Units into the skin daily.     No current facility-administered medications for this visit.    REVIEW OF SYSTEMS:   Please see the history of present illness.     All other systems reviewed and are negative.  PHYSICAL EXAM:    Recent Labs: 03/10/2022: NT-Pro BNP 220 06/24/2022: BUN 17; Creatinine, Ser 0.92; Hemoglobin 15.6; Platelets 244; Potassium 4.2; Sodium 138   Recent Lipid Panel Lab Results  Component Value Date/Time   CHOL 216 (H) 08/07/2016 12:40 PM   TRIG 118 08/07/2016 12:40 PM   HDL 34 (L) 08/07/2016 12:40 PM   CHOLHDL 6.4 08/07/2016 12:40 PM   LDLCALC 158 (H) 08/07/2016 12:40 PM    Wt Readings from Last 3 Encounters:  09/15/22 246 lb (111.6 kg)  08/06/22 246 lb (111.6 kg)  06/24/22 250 lb (113.4 kg)     STUDIES:   I have reviewed his carotid duplex with the following findings Right Carotid: The extracranial vessels were near-normal with only minimal  wall                thickening or plaque.   Left Carotid: The extracranial vessels were near-normal with only minimal  wall               thickening or plaque.   Vertebrals:  Bilateral vertebral arteries demonstrate antegrade flow.  Subclavians: Normal flow hemodynamics were seen in bilateral subclavian               arteries.  ASSESSMENT and PLAN   The patient would be a candidate for Barostim device implant, however he would like to have further discussions with Dr. Wyline Mood, his cardiologist.  He is also considering ICD placement.  He was originally scheduled for surgery on September 6.  It looks like this will need to be canceled.  We will work on getting him rescheduled in the future if that is what he desires    Durene Cal, IV, MD, FACS [facility] [phone] Pager [phone]

## 2022-09-17 NOTE — Telephone Encounter (Signed)
Received message from vascular, patient approved for barostim. Needs to be cleared at our next appointment  Dominga Ferry MD

## 2022-09-18 ENCOUNTER — Telehealth: Payer: Self-pay

## 2022-09-18 NOTE — Telephone Encounter (Signed)
Sent to MD to review.

## 2022-09-24 ENCOUNTER — Ambulatory Visit (INDEPENDENT_AMBULATORY_CARE_PROVIDER_SITE_OTHER): Payer: 59 | Admitting: Urology

## 2022-09-24 ENCOUNTER — Encounter: Payer: Self-pay | Admitting: Urology

## 2022-09-24 VITALS — BP 134/81 | HR 87 | Temp 97.8°F

## 2022-09-24 DIAGNOSIS — N5312 Painful ejaculation: Secondary | ICD-10-CM

## 2022-09-24 DIAGNOSIS — N401 Enlarged prostate with lower urinary tract symptoms: Secondary | ICD-10-CM

## 2022-09-24 DIAGNOSIS — N3281 Overactive bladder: Secondary | ICD-10-CM

## 2022-09-24 DIAGNOSIS — N138 Other obstructive and reflux uropathy: Secondary | ICD-10-CM | POA: Diagnosis not present

## 2022-09-24 DIAGNOSIS — N529 Male erectile dysfunction, unspecified: Secondary | ICD-10-CM

## 2022-09-24 LAB — URINALYSIS, ROUTINE W REFLEX MICROSCOPIC
Bilirubin, UA: NEGATIVE
Ketones, UA: NEGATIVE
Leukocytes,UA: NEGATIVE
Nitrite, UA: NEGATIVE
Protein,UA: NEGATIVE
RBC, UA: NEGATIVE
Specific Gravity, UA: 1.015 (ref 1.005–1.030)
Urobilinogen, Ur: 0.2 mg/dL (ref 0.2–1.0)
pH, UA: 6 (ref 5.0–7.5)

## 2022-09-24 LAB — BLADDER SCAN AMB NON-IMAGING: Scan Result: 0

## 2022-09-24 MED ORDER — GEMTESA 75 MG PO TABS
1.0000 | ORAL_TABLET | Freq: Every day | ORAL | 11 refills | Status: DC
Start: 2022-09-24 — End: 2023-03-26

## 2022-09-24 NOTE — Progress Notes (Signed)
Name: [name] [name]. DOB: 06-Jun-1965 MRN: [id number]  History of Present Illness: [name] [name] is a 57 y.o. male who presents today for follow up visit at [facility] [place]. - GU history: 1. BPH. - PSA was normal (0.5) on 07/12/2019. - 12/04/2019: Underwent TURP by [name] [name]. - Taking Flomax 0.4 mg daily. 2. OAB with urinary frequency, urgency, and urge incontinence. - Taking Vesicare 5 mg daily and Gemtesa 75 mg daily. 3. Erectile dysfunction. - Uses Trimix PRN for intracorporeal injections. 4. Suprapubic pain with ejaculation. The pain last 30-60 seconds and then subsides.    At last visit with [name] [name] on 04/27/2022: Added Flomax for painful ejaculation.  Today: He reports improved urinary frequency and urgency with his current OAB medications. Denies recent episodes of urge incontinence. Voiding 3x/night on average. Leaking rarely now.   He denies dysuria, gross hematuria, straining to void, or sensations of incomplete emptying.  He reports that the Trimix intracorporeal injection therapy is not  working adequately for management of erectile dysfunction. Pt states he is able to get a partial / weak erection which is not  strong enough for penetrative intercourse. The medication effect is really not much different than the erection quality he is able to achieve on his own.   He continues to report pain with ejaculations. The pain is located in the bladder "like a spasm" and last for 30-60 seconds.  He states it is somewhat bothersome but overall manageable.    Fall Screening: Do you usually have a device to assist in your mobility? No   Medications: Current Outpatient Medications  Medication Sig Dispense Refill   acetaminophen (TYLENOL) 650 MG CR tablet Take 1,300-1,950 mg by mouth daily as needed for pain.     AMBULATORY NON FORMULARY MEDICATION 0.2 mLs by Intracavernosal route as needed. Medication Name: Trimix  PGE Pap 30mg  Phent 1mg  5 mL 5    aspirin EC 81 MG tablet Take 81 mg by mouth daily.     atorvastatin (LIPITOR) 80 MG tablet Take 1 tablet by mouth once daily 90 tablet 1   BD PEN NEEDLE NANO 2ND GEN 32G X 4 MM MISC USE AS DIRECTED ONCE DAILY WITH SOLIQUA     carvedilol (COREG) 25 MG tablet Take 25 mg by mouth 2 (two) times daily.     dapagliflozin propanediol (FARXIGA) 10 MG TABS tablet Take by mouth daily.     desloratadine (CLARINEX) 5 MG tablet Take 1 tablet (5 mg total) by mouth daily. 90 tablet 3   escitalopram (LEXAPRO) 20 MG tablet Take 20 mg by mouth daily.     fluticasone (FLONASE) 50 MCG/ACT nasal spray Place 2 sprays into both nostrils daily. 16 g 6   glipiZIDE (GLUCOTROL XL) 10 MG 24 hr tablet Take 10 mg by mouth daily.     nitroGLYCERIN (NITROSTAT) 0.4 MG SL tablet Place 0.4 mg under the tongue every 5 (five) minutes as needed for chest pain.     sacubitril-valsartan (ENTRESTO) 97-103 MG Take 1 tablet by mouth twice daily 60 tablet 2   Semaglutide, 2 MG/DOSE, (OZEMPIC, 2 MG/DOSE,) 8 MG/3ML SOPN Inject 2 mg into the skin every Saturday.     solifenacin (VESICARE) 5 MG tablet Take 1 tablet (5 mg total) by mouth daily. 30 tablet 11   tamsulosin (FLOMAX) 0.4 MG CAPS capsule Take 1 capsule (0.4 mg total) by mouth daily after supper. 30 capsule 11   TOUJEO MAX SOLOSTAR 300 UNIT/ML Solostar Pen Inject 80  Units into the skin daily.     Vibegron (GEMTESA) 75 MG TABS Take 1 tablet (75 mg total) by mouth daily. 30 tablet 11   No current facility-administered medications for this visit.    Allergies: No Known Allergies  Past Medical History:  Diagnosis Date   Anxiety    Arthritis    in lower back   Chronic heart failure (HCC)    Complication of anesthesia    with mask and gas   Coronary artery disease    a. 02/2008: anterior STEMI s/p DES to prox LAD, b. Myoview 2012: scar in the anterior wall with mild ischemia and EF 33%, c. neg GXT 06/2011  d. 08/2016: cath with DES to diagonal    Depression with anxiety     Diabetes mellitus    TYPE II   Dyspnea    Fatty liver    H/O ELEVATED LIVER ENZYMES   Headache    High cholesterol    Hyperlipidemia    Hypertension    Ischemic cardiomyopathy 08/14/2016   a. Previous EF 33-35% 2012, b. 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)    Myocardial infarction (HCC)    Noncompliance    Prior hx of med noncompliance   OSA (obstructive sleep apnea)    PONV (postoperative nausea and vomiting)    Sleep apnea    Past Surgical History:  Procedure Laterality Date   CARDIAC CATHETERIZATION     CORONARY ANGIOPLASTY     CORONARY PRESSURE/FFR STUDY N/A 08/21/2016   Procedure: INTRAVASCULAR PRESSURE WIRE/FFR STUDY;  Surgeon: [name] Hazel, MD;  Location: [facility]ASIVE [facility];  Service: Cardiovascular;  Laterality: N/A;   CORONARY STENT INTERVENTION N/A 08/21/2016   Procedure: CORONARY STENT INTERVENTION;  Surgeon: [name] Hazel, MD;  Location: [facility];  Service: Cardiovascular;  Laterality: N/A;   CYSTOSCOPY N/A 12/04/2019   Procedure: CYSTOSCOPY;  Surgeon: [name] Gauze, MD;  Location: [facility];  Service: Urology;  Laterality: N/A;   DRUG INDUCED ENDOSCOPY Bilateral 06/24/2022   Procedure: DRUG INDUCED SLEEP ENDOSCOPY;  Surgeon: [name] Reading, MD;  Location[facility];  Service: ENT;  Laterality: Bilateral;   LIVER BIOPSY     MI WITH STENTS     NASAL SEPTOPLASTY W/ TURBINOPLASTY N/A 01/25/2019   Procedure: BILATERAL TURBINATE REDUCTION;  Surgeon: [name] Pies, MD;  Location: [facility] OR;  Service: ENT;  Laterality: N/A;   NASAL SINUS SURGERY     RIGHT/LEFT HEART CATH AND CORONARY ANGIOGRAPHY N/A 08/21/2016   Procedure: Right/Left Heart Cath and Coronary Angiography;  Surgeon: [name] Hazel, MD;  Location: [facility];  Service: Cardiovascular;  Laterality: N/A;   TRANSURETHRAL RESECTION OF PROSTATE N/A 12/04/2019   Procedure: TRANSURETHRAL RESECTION OF THE PROSTATE (TURP);  Surgeon:  [name] Gauze, MD;  Location: [facility];  Service: Urology;  Laterality: N/A;   Family History  Problem Relation Age of Onset   Coronary artery disease Other        UNKNOWN   Heart disease Father        CAD s/p CABG   Diabetes Mellitus II Father    Diabetes Mellitus II Mother    Cancer Brother    Social History   Socioeconomic History   Marital status: Legally Separated    Spouse name: Not on file   Number of children: Not on file   Years of education: Not on file   Highest education level: Not  on file  Occupational History   Not on file  Tobacco Use   Smoking status: Never    Passive exposure: Never   Smokeless tobacco: Never  Vaping Use   Vaping status: Never Used  Substance and Sexual Activity   Alcohol use: Never   Drug use: Never   Sexual activity: Not Currently  Other Topics Concern   Not on file  Social History Narrative   Not on file   Social Determinants of Health   Financial Resource Strain: Low Risk  (03/24/2022)   Received from [facility], [facility]   Overall Financial Resource Strain (CARDIA)    Difficulty of Paying Living Expenses: Not hard at all  Food Insecurity: No Food Insecurity (03/24/2022)   Received from [facility], [facility]   Hunger Vital Sign    Worried About Running Out of Food in the Last Year: Never true    Ran Out of Food in the Last Year: Never true  Transportation Needs: No Transportation Needs (03/24/2022)   Received from [facility], [facility]   PRAPARE - Transportation    Lack of Transportation (Medical): No    Lack of Transportation (Non-Medical): No  Physical Activity: Not on file  Stress: Not on file  Social Connections: Unknown (12/01/2021)   Received from [facility], [facility]   Social Network    Social Network: Not on file  Intimate Partner Violence: Unknown (12/01/2021)   Received from [facility], [facility]   HITS    Physically Hurt: Not on file    Insult or Talk Down To: Not on file     Threaten Physical Harm: Not on file    Scream or Curse: Not on file    Review of Systems Constitutional: Patient denies any unintentional weight loss or change in strength lntegumentary: Patient denies any rashes or pruritus Eyes: Patient reports dry eyes ENT: Patient reports dry mouth Cardiovascular: Patient denies chest pain or syncope Respiratory: Patient denies shortness of breath Gastrointestinal: Patient denies nausea, vomiting, or diarrhea. Reports constipation often; takes OTC stool softeners or laxatives as needed. Musculoskeletal: Patient denies muscle cramps or weakness Neurologic: Patient denies convulsions or seizures Psychiatric: Patient denies memory problems Allergic/Immunologic: Patient denies recent allergic reaction(s) Hematologic/Lymphatic: Patient denies bleeding tendencies Endocrine: Patient denies heat/cold intolerance  GU: As per HPI.  OBJECTIVE Vitals:   09/24/22 1313  BP: 134/81  Pulse: 87  Temp: 97.8 F (36.6 C)   There is no height or weight on file to calculate BMI.  Physical Examination Constitutional: No obvious distress; patient is non-toxic appearing  Cardiovascular: No visible lower extremity edema.  Respiratory: The patient does not have audible wheezing/stridor; respirations do not appear labored  Gastrointestinal: Abdomen non-distended Musculoskeletal: Normal ROM of UEs  Skin: No obvious rashes/open sores  Neurologic: CN 2-12 grossly intact Psychiatric: Answered questions appropriately with normal affect  Hematologic/Lymphatic/Immunologic: No obvious bruises or sites of spontaneous bleeding  UA: no evidence of UTI or microscopic hematuria PVR: 0 ml  ASSESSMENT BPH with urinary obstruction - Plan: Urinalysis, Routine w reflex microscopic, BLADDER SCAN AMB NON-IMAGING  OAB (overactive bladder) - Plan: Urinalysis, Routine w reflex microscopic, BLADDER SCAN AMB NON-IMAGING, Vibegron (GEMTESA) 75 MG TABS  Erectile dysfunction,  unspecified erectile dysfunction type - Plan: Urinalysis, Routine w reflex microscopic, BLADDER SCAN AMB NON-IMAGING  Painful ejaculation - Plan: Urinalysis, Routine w reflex microscopic, BLADDER SCAN AMB NON-IMAGING  1. BPH. Well managed with Flomax 0.4 mg daily.  2. OAB with urinary frequency, urgency, and  urge incontinence. Well managed with Vesicare 5 mg daily and Gemtesa 75 mg daily.  3. Erectile dysfunction. Thorough discussion was had with patient regarding possible etiologies of erectile dysfunction including psychological, vasculogenic, neurologic, and testosterone deficiency. We reviewed options for therapy including medication, vacuum pumps, penile injections, MUSE, and penile implants. Patient was advised that not all these options are likely to be covered by insurance. Per patient request will consult [name] [name] re: possible dose increase for ICI.  4. Suprapubic pain with ejaculation. Discussed option to increase Vesicare 10 mg daily to further minimize risk for bladder spasms. We agreed to hold off on that for now due to pre-existing dry mouth / dry eyes / constipation and because he feels the bladder pain / spasms with ejaculation are currently manageable.  Will plan for follow up in 6 months or sooner if needed. Pt verbalized understanding and agreement. All questions were answered.  PLAN Advised the following: 1. Continue Flomax 0.4 mg daily. 2. Continue Vesicare 5 mg daily. 3. Continue Gemtesa 75 mg daily. 4. Consult note sent to [name] [name] re: Trimix dose. 5. Return in about 6 months (around 03/24/2023) for f/u with [name] [name].  Orders Placed This Encounter  Procedures   Urinalysis, Routine w reflex microscopic   BLADDER SCAN AMB NON-IMAGING   It has been explained that the patient is to follow regularly with their PCP in addition to all other providers involved in their care and to follow instructions provided by these respective offices. Patient advised to contact  urology clinic if any urologic-pertaining questions, concerns, new symptoms or problems arise in the interim period.  There are no Patient Instructions on file for this visit.  Electronically signed by:  Donnita Falls, FNP   09/24/22    1:51 PM

## 2022-09-25 ENCOUNTER — Ambulatory Visit (HOSPITAL_COMMUNITY): Admission: RE | Admit: 2022-09-25 | Payer: 59 | Source: Home / Self Care | Admitting: Surgery

## 2022-09-25 ENCOUNTER — Encounter (HOSPITAL_COMMUNITY): Admission: RE | Payer: Self-pay | Source: Home / Self Care

## 2022-09-25 SURGERY — INSERTION, CAROTID SINUS BAROREFLEX ACTIVATION DEVICE
Anesthesia: General | Laterality: Right

## 2022-09-30 ENCOUNTER — Telehealth: Payer: Self-pay

## 2022-09-30 ENCOUNTER — Other Ambulatory Visit: Payer: Self-pay | Admitting: Urology

## 2022-09-30 DIAGNOSIS — N529 Male erectile dysfunction, unspecified: Secondary | ICD-10-CM

## 2022-09-30 MED ORDER — AMBULATORY NON FORMULARY MEDICATION: 0.2000 mL | 5 refills | Status: AC | PRN

## 2022-09-30 NOTE — Telephone Encounter (Signed)
Tried calling patient about Trimix prescription. Left vm for return call.

## 2022-10-01 NOTE — Telephone Encounter (Signed)
Patient is returning your call.  

## 2022-10-01 NOTE — Telephone Encounter (Signed)
Return call to patient trimix prescription, patient prefer for prescription to be fax[facility]. Patient is made aware prescription has been faxed to [facility] and voiced understanding.

## 2022-10-19 ENCOUNTER — Encounter: Payer: Self-pay | Admitting: Nurse Practitioner

## 2022-10-19 ENCOUNTER — Ambulatory Visit: Payer: 59 | Attending: Cardiology | Admitting: Nurse Practitioner

## 2022-10-19 VITALS — BP 110/60 | HR 90 | Ht 70.0 in | Wt 247.2 lb

## 2022-10-19 DIAGNOSIS — R0789 Other chest pain: Secondary | ICD-10-CM

## 2022-10-19 DIAGNOSIS — G4733 Obstructive sleep apnea (adult) (pediatric): Secondary | ICD-10-CM

## 2022-10-19 DIAGNOSIS — I10 Essential (primary) hypertension: Secondary | ICD-10-CM

## 2022-10-19 DIAGNOSIS — I2510 Atherosclerotic heart disease of native coronary artery without angina pectoris: Secondary | ICD-10-CM | POA: Diagnosis not present

## 2022-10-19 DIAGNOSIS — R053 Chronic cough: Secondary | ICD-10-CM

## 2022-10-19 DIAGNOSIS — I5022 Chronic systolic (congestive) heart failure: Secondary | ICD-10-CM

## 2022-10-19 DIAGNOSIS — E785 Hyperlipidemia, unspecified: Secondary | ICD-10-CM

## 2022-10-19 NOTE — Progress Notes (Signed)
Cardiology Office Note:  .   Date:  10/19/2022  ID:  [name] Dutton., DOB Sep 03, 1965, MRN [id number] PCP: Rebekah Chesterfield, NP  [facility] Providers Cardiologist:  [name], MD    History of Present Illness: .   [name] [name]. is a 57 y.o. male with a PMH of chronic systolic CHF, ischemic cardiomyopathy, CAD, hypertension, OSA (followed by Dr. Vassie Loll), hyperlipidemia and history of prior LV thrombus, who presents today for 38-month follow-up.  Prior CV history of anterior STEMI in 2010, received DES to LAD.  LV EF at that time, 35%.  In 2012, Myoview revealed anterior scar with mild ischemia.  EF in 2013 was 45 to 50%.  GXT at that time was negative for ischemia.  In 2018, echocardiogram revealed EF reduced to 30 to 35%, diffuse hypokinesis with akinesis of anterior and anteroseptal walls, evidence of LV thrombus, was referred for cardiac cath.  See full report below of cardiac catheterization in August 2018, he received drug-eluting stent to D2.  Repeat echo in November 2018 showed EF 35% with apical thrombus.  Repeat echo in February 2019 revealed EF 35 to 40%, restrictive, no clear thrombus.  NST in 2020 showed large anterior/anteroseptal scar, EF 22%. TTE in 2022 EF 25%.  He has been evaluated by EP due to reduced EF and discussed ICD, patient has requested to give this some thought. Most recent Echo 02/2022 revealed EF 25 to 30%, considering barostim.  Last seen by Dr. [name] on April 22, 2022.  Was euvolemic on exam.  Patient noted chronic stable shortness of breath on exertion, NYHA class III symptoms.  Was considering barostim.  Patient stated he had not committed to ICD.  Today he presents for 83-month follow-up visit.  He continues to note chronic shortness of breath and fatigue, not sure if fatigue is due to his OSA or heart disease. Says he is pending a surgical procedure for his OSA with Dr. Jenne Pane - implantation of hypoglossal nerve stimulator, wants to know if  Dr. Wyline Mood is okay with this. Does admit to chronic cough for the last year or two that makes him dizzy, believes he is dehydrated. Says he believes his cough is brought on by the weather change. He describes his chest tightness as ongoing for the past several months. Says "it feels like heartburn," located along the center of his chest and along the left side of his chest along his muscle, not associated with exertion. No specific triggers. Goes away on its own, but sometimes can last intermittently all day, does admit to stress. Says shortness of breath can also be triggered randomly when watching TV. Denies any palpitations, syncope, presyncope, orthopnea, PND, swelling or significant weight changes, acute bleeding, or claudication.  ROS: Negative.  See HPI.  Studies Reviewed: Marland Kitchen    EKG: EKG Interpretation Date/Time:  Monday October 19 2022 10:17:54 EDT Ventricular Rate:  88 PR Interval:  164 QRS Duration:  92 QT Interval:  378 QTC Calculation: 457 R Axis:   24  Text Interpretation: Normal sinus rhythm Septal infarct (cited on or before 06-Jul-2011) When compared with ECG of 18-Jan-2019 08:46, No significant change was found Confirmed by Sharlene Dory [phone]) on 10/19/2022 10:20:02 AM   Echo 02/2022:  1. Left ventricular ejection fraction, by estimation, is 25 to 30%. The  left ventricle has severely decreased function. The left ventricle  demonstrates global hypokinesis with mid to apical septal akinesis and  akinesis of the true apex. The  left  ventricular internal cavity size was mildly dilated. Left ventricular  diastolic parameters are consistent with Grade I diastolic dysfunction  (impaired relaxation).   2. Right ventricular systolic function is mildly reduced. The right  ventricular size is normal. Tricuspid regurgitation signal is inadequate  for assessing PA pressure.   3. The mitral valve is normal in structure. No evidence of mitral valve  regurgitation. No evidence of  mitral stenosis.   4. The aortic valve is tricuspid. There is mild calcification of the  aortic valve. Aortic valve regurgitation is not visualized. No aortic  stenosis is present.   5. Aortic dilatation noted. There is mild dilatation of the aortic root,  measuring 38 mm.   6. The inferior vena cava is normal in size with greater than 50%  respiratory variability, suggesting right atrial pressure of 3 mmHg.  Carotid duplex 02/2022: Summary:  Right Carotid: The extracranial vessels were near-normal with only minimal wall thickening or plaque.   Left Carotid: The extracranial vessels were near-normal with only minimal wall thickening or plaque.   Vertebrals:  Bilateral vertebral arteries demonstrate antegrade flow.  Subclavians: Normal flow hemodynamics were seen in bilateral subclavian arteries.  Lexiscan 10/2021:       Findings are consistent with large prior anteroseptal  myocardial infarction. There is no current ischemia.  The study is high risk based on decreased LVEF alone, there is no current myocardium at jeopardy. Consider correlating LVEF with echo.   No ST deviation was noted.   LV perfusion is abnormal.   Left ventricular function is abnormal. Nuclear stress EF: 24 %. End diastolic cavity size is severely enlarged.     Right/left heart cath 08/2016: Mid RCA-2 lesion, 30 %stenosed. Mid RCA-1 lesion, 30 %stenosed. Ost Ramus to Ramus lesion, 20 %stenosed. Ost LAD to Prox LAD lesion, 10 %stenosed. Prox LAD to Mid LAD lesion, 60 %stenosed. A STENT SYNERGY DES 2.25X12 drug eluting stent was successfully placed. 2nd Diag lesion, 99 %stenosed. Post intervention, there is a 0% residual stenosis.   1. Patent stent proximal LAD 2. Moderate stenosis mid LAD. FFR was 0.84 suggesting the stenosis was not flow limiting.  3. The Diagonal branch is a moderate caliber vessel with a 99% stenosis.  4. Successful PTCA/DES x 1 Diagonal branch 5. Mild disease RCA   Recommendations: Will  continue ASA and Plavix for now. He will need to be started on coumadin tomorrow before discharge for his LV thrombus. He can stop ASA when his INR is therapeutic on coumadin since he was not an ACS. I would plan long term coumadin for LV thrombus and Plavix for at least one year given placement of DES. Continue statin and beta blocker.      Physical Exam:   VS:  BP 110/60   Pulse 90   Ht 5\' 10"  (1.778 m)   Wt 247 lb 3.2 oz (112.1 kg)   SpO2 95%   BMI 35.47 kg/m    Wt Readings from Last 3 Encounters:  10/19/22 247 lb 3.2 oz (112.1 kg)  09/15/22 246 lb (111.6 kg)  08/06/22 246 lb (111.6 kg)    GEN: Obese, 57 year old male in no acute distress NECK: No JVD; No carotid bruits CARDIAC: S1/S2, RRR, no murmurs, rubs, gallops RESPIRATORY:  Clear to auscultation without rales, wheezing or rhonchi, strong/nonproductive cough ABDOMEN: Soft, non-tender, non-distended EXTREMITIES:  No edema; No deformity   ASSESSMENT AND PLAN: .    Chronic systolic CHF  Stage C, NYHA class II-III symptoms.  EF 02/2022 25 to 30%.  Continue carvedilol, Clifton Custard, currently not on a diuretic.  Will obtain the following labs: BMET, proBNP, magnesium.  Depending on kidney function, plan to start MRA. Low sodium diet, fluid restriction <2L, and daily weights encouraged. Educated to contact our office for weight gain of 2 lbs overnight or 5 lbs in one week.  Will refer to the heart failure clinic.  He continues to want to consider ICD at this time.  2. Atypical chest pain, CAD Symptoms sound very atypical.  He does admit to stress that I believe is contributing to his symptoms.  NST in 2023 was negative for current ischemia.  EKG reassuring today.  Continue aspirin, Lipitor, carvedilol, Entresto, and nitroglycerin as needed.  No indication for ischemic evaluation at this time.  Care and ED precautions discussed.  3. HTN Blood pressure stable, soft.  Does admit to potential dehydration.  No medication changes at  this time.  Encourage adequate hydration. Discussed to monitor BP at home at least 2 hours after medications and sitting for 5-10 minutes. Heart healthy diet and regular cardiovascular exercise encouraged.   4. HLD LDL 58 03/2021.  He is currently at goal.  Continue atorvastatin.  At next office visit, plan to request labs from PCP's office. Heart healthy diet and regular cardiovascular exercise encouraged.   5. OSA, chronic cough He is pending implantation of hypoglossal nerve stimulator with ENT, Dr. Christia Reading on 11/18/2022.  Will route note to Dr. Wyline Mood to see if this is okay from his perspective.  Continue to follow-up with Dr. Vassie Loll and recommended to follow-up with PCP regarding chronic cough, could be possible bronchitis related to weather change.  6. Stress Denies any red flag signs or symptoms. Pt agreeable to speak with LCSW regarding caregiver stress. Will refer.    Dispo: Follow-up with me/APP in 4-6 weeks or sooner if anything changes.   Signed, Sharlene Dory, NP

## 2022-10-19 NOTE — Patient Instructions (Addendum)
Medication Instructions:  Your physician recommends that you continue on your current medications as directed. Please refer to the Current Medication list given to you today.  Labwork: Labs in 1 week BMET, BNP, MAG  Testing/Procedures: None  Follow-Up: Your physician recommends that you schedule a follow-up appointment in: 4-6 weeks   Any Other Special Instructions Will Be Listed Below (If Applicable).  If you need a refill on your cardiac medications before your next appointment, please call your pharmacy.

## 2022-10-23 ENCOUNTER — Ambulatory Visit: Payer: 59 | Admitting: Urology

## 2022-10-26 ENCOUNTER — Telehealth (HOSPITAL_COMMUNITY): Payer: Self-pay | Admitting: Licensed Clinical Social Worker

## 2022-10-26 NOTE — Telephone Encounter (Signed)
CSW received referral to assist patient with caregiver stress. CSW contacted patient who shared that he is the primary caregiver for his mother who requires assistance with some ADL's and he is unable to provide the acre she needs. He stated that she doesn't want him to help with personal care and has some incontinence issues. CSW provided supportive intervention and suggested that patient contact his mother's PCP to inquire about referrals for additional care and services to assist with care needs. Patient grateful for the information and support. Lasandra Beech, LCSW, CCSW-MCS [phone]

## 2022-10-27 ENCOUNTER — Encounter (HOSPITAL_COMMUNITY): Payer: Self-pay | Admitting: Cardiology

## 2022-10-27 ENCOUNTER — Ambulatory Visit (HOSPITAL_COMMUNITY)
Admission: RE | Admit: 2022-10-27 | Discharge: 2022-10-27 | Disposition: A | Discharge status: Home / Self Care | Payer: 59 | Source: Ambulatory Visit | Attending: Cardiology | Admitting: Cardiology

## 2022-10-27 VITALS — BP 120/80 | HR 87 | Wt 246.8 lb

## 2022-10-27 DIAGNOSIS — I5022 Chronic systolic (congestive) heart failure: Secondary | ICD-10-CM

## 2022-10-27 DIAGNOSIS — G4733 Obstructive sleep apnea (adult) (pediatric): Secondary | ICD-10-CM | POA: Insufficient documentation

## 2022-10-27 DIAGNOSIS — R9431 Abnormal electrocardiogram [ECG] [EKG]: Secondary | ICD-10-CM | POA: Insufficient documentation

## 2022-10-27 DIAGNOSIS — I110 Hypertensive heart disease with heart failure: Secondary | ICD-10-CM | POA: Insufficient documentation

## 2022-10-27 DIAGNOSIS — I255 Ischemic cardiomyopathy: Secondary | ICD-10-CM | POA: Diagnosis not present

## 2022-10-27 DIAGNOSIS — I2510 Atherosclerotic heart disease of native coronary artery without angina pectoris: Secondary | ICD-10-CM | POA: Insufficient documentation

## 2022-10-27 DIAGNOSIS — R079 Chest pain, unspecified: Secondary | ICD-10-CM | POA: Diagnosis present

## 2022-10-27 DIAGNOSIS — Z955 Presence of coronary angioplasty implant and graft: Secondary | ICD-10-CM | POA: Diagnosis not present

## 2022-10-27 LAB — BASIC METABOLIC PANEL
BUN/Creatinine Ratio: 15 (ref 9–20)
BUN: 13 mg/dL (ref 6–24)
CO2: 19 mmol/L — ABNORMAL LOW (ref 20–29)
Calcium: 9.8 mg/dL (ref 8.7–10.2)
Chloride: 106 mmol/L (ref 96–106)
Creatinine, Ser: 0.89 mg/dL (ref 0.76–1.27)
Glucose: 116 mg/dL — ABNORMAL HIGH (ref 70–99)
Potassium: 4.4 mmol/L (ref 3.5–5.2)
Sodium: 140 mmol/L (ref 134–144)
eGFR: 101 mL/min/{1.73_m2} (ref >59)

## 2022-10-27 LAB — BRAIN NATRIURETIC PEPTIDE: BNP: 75.4 pg/mL (ref 0.0–100.0)

## 2022-10-27 LAB — MAGNESIUM: Magnesium: 2.1 mg/dL (ref 1.6–2.3)

## 2022-10-27 MED ORDER — SPIRONOLACTONE 25 MG PO TABS: 12.5000 mg | ORAL_TABLET | Freq: Every day | ORAL | 3 refills | Status: DC

## 2022-10-27 NOTE — Progress Notes (Signed)
ADVANCED HEART FAILURE NEW PATIENT CLINIC NOTE  Referring Physician: Rebekah Chesterfield, NP  Primary Care: Rebekah Chesterfield, NP Primary Cardiologist:  HPI: [name] [name]. is a 57 y.o. 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     Prior CV history of anterior STEMI in 2010, received DES to LAD.  LV EF at that time, 35%.  In 2012, Myoview revealed anterior scar with mild ischemia.  EF in 2013 was 45 to 50%.  GXT at that time was negative for ischemia.  In 2018, echocardiogram revealed EF reduced to 30 to 35%, diffuse hypokinesis with akinesis of anterior and anteroseptal walls, evidence of LV thrombus, was referred for cardiac cath.  See full report below of cardiac catheterization in August 2018, he received drug-eluting stent to D2.  Repeat echo in November 2018 showed EF 35% with apical thrombus.  Repeat echo in February 2019 revealed EF 35 to 40%, restrictive, no clear thrombus.  NST in 2020 showed large anterior/anteroseptal scar, EF 22%. TTE in 2022 EF 25%.  He has been evaluated by EP due to reduced EF and discussed ICD, patient has requested to give this some thought. Most recent Echo 02/2022 revealed EF 25 to 30%, considering barostim but now planning on moving forward with [facility] device.        SUBJECTIVE: Patient reports that his main limiting symptom is fatigue.  Over the past year, he will get short of breath with mild to moderate exertion, and sometimes endorses shortness of breath at rest.  He notes that this is often associated with stress, and currently he is the sole caregiver for his mother at home.  He is working with social work to pursue additional options.  Does not live with anyone else and has had prior issues with income in the past.  We discussed briefly advanced therapies, he would like to pursue  inspire placement see how treatment of his sleep apnea affects his energy levels.  PMH, current medications, allergies, social history, and family history reviewed in epic.  PHYSICAL EXAM: Vitals:   10/27/22 0909  BP: 120/80  Pulse: 87  SpO2: 93%   GENERAL: Well nourished and in no apparent distress at rest.  HEENT: Negative for xanthelasma. There is no scleral icterus.  The mucous membranes are pink and moist.   CHEST: There are no chest wall deformities. There is no chest wall tenderness. Respirations are unlabored.  Lungs- Clear to auscultation bilaterally, normal work of breathing. CARDIAC:  JVP: Not elevated          Normal rate with regular rhythm. No murmurs, rubs or gallops.  Pulses are 2+ and symmetrical in upper and lower extremities.  No edema.  ABDOMEN: Soft, non-tender, non-distended. Marland Kitchen  EXTREMITIES: Warm and well perfused with no cyanosis, clubbing.  NEUROLOGIC: Patient is oriented x3 with no focal or lateralizing neurologic deficits.  PSYCH: Patients affect is appropriate, there is no evidence of anxiety or depression.  SKIN: Warm and dry; no lesions or wounds.   DATA REVIEW  ECG: Anteroseptal infarct pattern, normal sinus rhythm, normal QRS  ECHO: 02/2022: EF 25-30%, mid to apical septal and anterior akinesis, mildly dilated LV, mildly reduced RV systolic function.  CATH: 08/2016: Patent LAD stent, moderate mid LAD stenosis FFR negative, D2 stenosis.  Lexiscan 2023: Large prior anteroseptal myocardial infarction. No evidence of reversible  ishcemia.    Heart failure review: - Classification: Heart failure with reduced EF - Etiology: Ischemic - NYHA Class: III - Volume status: Euvolemic - ACEi/ARB/ARNI: Maximally tolerated dose - Aldosterone antagonist: Maximally tolerated dose - Beta-blocker: Currently up-titrating - Digoxin: Plan to start at a subsequent visit - Hydralazine/Nitrates: Not a candidate - SGLT2i: Maximally tolerated dose - GLP-1: Consider in  future - Advanced therapies: Not needed at this time - ICD: Currently uptitrating GDMT   ASSESSMENT & PLAN:  Chronic systolic HF: Patient with stable Stage C, NYHA Class III symptoms. He has been on excellent medical thearpy, though it appears MRA fell off. His main symptom is fatigue. Doubt that EF will meaningfully improve given known ischemia. His candidacy for advanced therapies is limited by lack of social support at home, but does not currently appear decompensated enough to qualify. Barostim unlikely to be beneficial with upcoming Inspire placement (similar signal placement) though I am not sure of any data covering the interaction. Plan to increase medical therapy and low threshold for RHC/CPET. - Restart spironolactone 25 mg daily -BMP in 2 weeks -Continue carvedilol 25 mg twice a day, Entresto 97/103 mg twice daily, dapagliflozin 10 mg daily -Consider addition of ivabradine versus vericiguat at next appointment given normal BP -Low threshold for invasive assessment  CAD: Known LAD disease, atypical chest pain that appears to be associated with stress.  SPECT without evidence of ischemia. -Continue beta-blocker, aspirin, statin  HTN: Well-controlled on current therapy as above  OSA: Unable to tolerate CPAP, planning for hypoglossal nerve stimulator placement.    Clearnce Hasten, MD Advanced Heart Failure Mechanical Circulatory Support 10/27/22

## 2022-10-27 NOTE — Patient Instructions (Signed)
START Spironolactone 12.5 mg (1/2 Tab) daily.  Blood work in 3 weeks at [facility].  Your physician recommends that you schedule a follow-up appointment in: 3 months( January 2025) ** PLEASE CALL THE OFFICE IN NOVEMBER TO ARRANGE YOUR FOLLOW UP APPOINTMENT. **  If you have any questions or concerns before your next appointment please send [place] a message through [place] or call our office at [phone].    TO LEAVE A MESSAGE FOR THE NURSE SELECT OPTION 2, PLEASE LEAVE A MESSAGE INCLUDING: YOUR NAME DATE OF BIRTH CALL BACK NUMBER REASON FOR CALL**this is important as we prioritize the call backs  YOU WILL RECEIVE A CALL BACK THE SAME DAY AS LONG AS YOU CALL BEFORE 4:00 PM  At the [facility], you and your health needs are our priority. As part of our continuing mission to provide you with exceptional heart care, we have created designated Provider Care Teams. These Care Teams include your primary Cardiologist (physician) and Advanced Practice Providers (APPs- Physician Assistants and Nurse Practitioners) who all work together to provide you with the care you need, when you need it.   You may see any of the following providers on your designated Care Team at your next follow up: Dr Arvilla Meres Dr Marca Ancona Dr. Dorthula Nettles Dr. Clearnce Hasten Amy Filbert Schilder, NP Robbie Lis, [place] [facility] [place], [place] Brynda Peon, NP [place] Lee, NP Karle Plumber, PharmD   Please be sure to bring in all your medications bottles to every appointment.    Thank you for choosing [facility]

## 2022-11-13 ENCOUNTER — Other Ambulatory Visit: Payer: Self-pay | Admitting: Otolaryngology

## 2022-11-16 ENCOUNTER — Encounter (HOSPITAL_COMMUNITY): Payer: Self-pay | Admitting: Otolaryngology

## 2022-11-16 ENCOUNTER — Other Ambulatory Visit: Payer: Self-pay

## 2022-11-16 NOTE — Progress Notes (Addendum)
PCP - Dr Gilman Schmidt Cardiologist - Dr [name] Endocrinology - Benedict Needy, NP Pulmonary - Dr Vassie Loll  Chest x-ray - n/a EKG - 10/27/22 Stress Test - 11/11/21 ECHO - 03/10/22 Cardiac Cath - 08/2016  ICD Pacemaker/Loop - n/a  Sleep Study -  Yes (08/2022) CPAP - unable to tolerate CPAP  Diabetes Type 2 Last dose of Farxiga was on 11/15/22.  Last dose of Ozempic was on 11/07/22.  DO not take Glipizide on DOS.      THE MORNING OF SURGERY, take 36 Units of Toujeo Insulin.  If your blood sugar is less than 70 mg/dL, you will need to treat for low blood sugar: Treat a low blood sugar (less than 70 mg/dL) with  cup of clear juice (cranberry or apple), 4 glucose tablets, OR glucose gel. Recheck blood sugar in 15 minutes after treatment (to make sure it is greater than 70 mg/dL). If your blood sugar is not greater than 70 mg/dL on recheck, call [phone] for further instructions.  Aspirin Instructions:Last dose was on 11/12/22.   ERAS: Clear liquids til 7:50 AM DOS.  Anesthesia review: Yes  STOP now taking any Aspirin (unless otherwise instructed by your surgeon), Aleve, Naproxen, Ibuprofen, Motrin, Advil, Goody's, BC's, all herbal medications, fish oil, and all vitamins.   Coronavirus Screening Do you have any of the following symptoms:  Cough yes/no: No Fever (>100.22F)  yes/no: No Runny nose yes/no: No Sore throat yes/no: No Difficulty breathing/shortness of breath  yes/no: No  Have you traveled in the last 14 days and where? yes/no: No  Patient verbalized understanding of instructions that were given via phone.

## 2022-11-17 NOTE — Anesthesia Preprocedure Evaluation (Signed)
Anesthesia Evaluation  Patient identified by MRN, date of birth, ID band Patient awake    Reviewed: Allergy & Precautions, NPO status , Patient's Chart, lab work & pertinent test results  History of Anesthesia Complications (+) PONV and history of anesthetic complications  Airway Mallampati: III  TM Distance: >3 FB Neck ROM: Full    Dental  (+) Poor Dentition, Loose, Missing,    Pulmonary sleep apnea    breath sounds clear to auscultation       Cardiovascular hypertension, + CAD, + Past MI and + Cardiac Stents   Rhythm:Regular Rate:Normal  Echo: 1. Left ventricular ejection fraction, by estimation, is 25 to 30%. The  left ventricle has severely decreased function. The left ventricle  demonstrates global hypokinesis with mid to apical septal akinesis and  akinesis of the true apex. The left  ventricular internal cavity size was mildly dilated. Left ventricular  diastolic parameters are consistent with Grade I diastolic dysfunction  (impaired relaxation).   2. Right ventricular systolic function is mildly reduced. The right  ventricular size is normal. Tricuspid regurgitation signal is inadequate  for assessing PA pressure.   3. The mitral valve is normal in structure. No evidence of mitral valve  regurgitation. No evidence of mitral stenosis.   4. The aortic valve is tricuspid. There is mild calcification of the  aortic valve. Aortic valve regurgitation is not visualized. No aortic  stenosis is present.   5. Aortic dilatation noted. There is mild dilatation of the aortic root,  measuring 38 mm.   6. The inferior vena cava is normal in size with greater than 50%  respiratory variability, suggesting right atrial pressure of 3 mmHg.     Neuro/Psych  Headaches PSYCHIATRIC DISORDERS Anxiety Depression       GI/Hepatic negative GI ROS, Neg liver ROS,,,  Endo/Other  diabetes    Renal/GU negative Renal ROS      Musculoskeletal  (+) Arthritis ,    Abdominal   Peds  Hematology negative hematology ROS (+)   Anesthesia Other Findings   Reproductive/Obstetrics                             Anesthesia Physical Anesthesia Plan  ASA: 4  Anesthesia Plan: General   Post-op Pain Management: Tylenol PO (pre-op)*   Induction: Intravenous  PONV Risk Score and Plan: 4 or greater and Ondansetron, Dexamethasone, Midazolam and Droperidol  Airway Management Planned: Oral ETT  Additional Equipment: None  Intra-op Plan:   Post-operative Plan: Extubation in OR  Informed Consent: I have reviewed the patients History and Physical, chart, labs and discussed the procedure including the risks, benefits and alternatives for the proposed anesthesia with the patient or authorized representative who has indicated his/her understanding and acceptance.     Dental advisory given  Plan Discussed with: CRNA  Anesthesia Plan Comments: (PAT note written 11/17/2022 by Shonna Chock, PA-C.  )       Anesthesia Quick Evaluation

## 2022-11-17 NOTE — Progress Notes (Signed)
Anesthesia Chart Review: Maury Dus  Case: [id number] Date/Time: 11/18/22 1039   Procedure: IMPLANTATION OF HYPOGLOSSAL NERVE STIMULATOR (Right)   Anesthesia type: General   Pre-op diagnosis: obstructive sleep apnea   [facility] 09 / [facility]   Surgeons: Christia Reading, MD       DISCUSSION: Patient is a 57 year old male scheduled for the above procedure. S/p drug-induced sleep endoscopy on 06/24/22. Repeat sleep study on 08/31/22 per Dr. Vassie Loll showed "severe OSA, no sig central apneas", so plans to proceed with Inpsire device.     History includes never smoker, CAD (anterior STEMI, s/p thrombectomy, DES LAD 02/21/2008), ischemic cardiomyopathy, chronic systolic CHF (LV thrombus 2028, s/p warfarin), DM2, fatty liver, OSA (intolerant to CPAP), BPH (s/p TURP 12/04/19)   He was last evaluated by HF cardiologist Dr. Jonah Blue on 10/27/22. By notes, anterior STEMI 2010, s/p DES LAD, EF 25%. 2012 Myoview showed anterior scar with mild ischemia. EF 45-50% in 2013. 2018 Echo revealed EF 30-35%, diffuse hypokinesis with akinesis of anterior and anteroseptal walls, evidence of LV thrombus and was referred for cath. S/p DES D2 08/2016. 2020 NST showed large anterior/anteroseptal scar, EF 22%. EF 25% by 2022 TTE. EP discussed ICD, but patient was not ready to commit to ICD. Lexiscan in 2023 showed no reversible ischemia. Echo in 02/2022 showed EF 25-30% and Barostim considered. He wanted to pursue Inspire placement to see if treatment of his OSA would improve his energy.  At visit, volume status was euvolemic. Stable Stage C, NYHA Class III symptoms. Currently uptitrating GDMT before considering ICD. Dr. Elwyn Lade wrote, "He has been on excellent medical thearpy, though it appears MRA fell off. His main symptom is fatigue. Doubt that EF will meaningfully improve given known ischemia. His candidacy for advanced therapies is limited by lack of social support at home, but does not currently appear decompensated  enough to qualify. Barostim unlikely to be beneficial with upcoming Inspire placement (similar signal placement) though I am not sure of any data covering the interaction. Plan to increase medical therapy and low threshold for RHC/CPET." He noted plans for hypoglossal nerve stimulator placement.   Last primary cardiology visit was on 10/19/22 with Sharlene Dory, NP. No ischemic testing recommended at that visit. She was going to communicate with Dr. Wyline Mood about surgery plans. She also referred to [facility] with recent evaluation as mentioned above.   Advised by Dr. Jenne Pane on 11/09/21 to hold ASA starting 11/11/22 and hold Ozempic. Last Farxiga 11/15/22 and last Ozempic 11/07/22.    Anesthesia team to evaluate on the day of surgery.    VS: Ht 5\' 10"  (1.778 m)   Wt 108.9 kg   BMI 34.44 kg/m   PROVIDERS: Rebekah Chesterfield, NP is PCP  [name], MD is cardiologist. Also followed by EP cardiology, previously Hillis Range, MD but will likely see Steffanie Dunn, MD moving forward. Last visit was with Polly Cobia. Greig Castilla, PA-C on 02/10/22.  Clearnce Hasten, MD is HF cardiologist Cyril Mourning, MD is pulmonologist Benedict Needy, NP is endocrinology provider Wilkie Aye, MD is urologist Venida Jarvis, MD is vascular surgeon (for Barostim evaluation on 09/17/22). Patient wanted to discuss further with cardiology before deciding whether to proceed or not.    LABS: Most recent lab results in [facility] include: Lab Results  Component Value Date   WBC 6.4 06/24/2022   HGB 15.6 06/24/2022   HCT 48.3 06/24/2022   PLT 244 06/24/2022   GLUCOSE 116 (H) 10/26/2022  NA 140 10/26/2022   K 4.4 10/26/2022   CL 106 10/26/2022   CREATININE 0.89 10/26/2022   BUN 13 10/26/2022   CO2 19 (L) 10/26/2022   A1c 6.6% on 10/08/22 ([facility]).     IMAGES: CTA Neck 03/26/2022: IMPRESSION: No hemodynamically significant stenosis in the neck.     EKG: 10/27/22: Normal sinus rhythm Septal  infarct Abnormal ECG When compared with ECG of 19-Oct-2022 10:17, No significant change was found Confirmed by Lewayne Bunting [phone]) on 10/27/2022 10:06:39 PM   CV: US Carotid 03/19/2022: Summary:  - Right Carotid: The extracranial vessels were near-normal with only minimal wall thickening or plaque.  - Left Carotid: The extracranial vessels were near-normal with only minimal wall thickening or plaque.  - Vertebrals:  Bilateral vertebral arteries demonstrate antegrade flow.  - Subclavians: Normal flow hemodynamics were seen in bilateral subclavian arteries.      Echo 03/10/2022: IMPRESSIONS   1. Left ventricular ejection fraction, by estimation, is 25 to 30%. The  left ventricle has severely decreased function. The left ventricle  demonstrates global hypokinesis with mid to apical septal akinesis and  akinesis of the true apex. The left  ventricular internal cavity size was mildly dilated. Left ventricular  diastolic parameters are consistent with Grade I diastolic dysfunction  (impaired relaxation).   2. Right ventricular systolic function is mildly reduced. The right  ventricular size is normal. Tricuspid regurgitation signal is inadequate  for assessing PA pressure.   3. The mitral valve is normal in structure. No evidence of mitral valve  regurgitation. No evidence of mitral stenosis.   4. The aortic valve is tricuspid. There is mild calcification of the  aortic valve. Aortic valve regurgitation is not visualized. No aortic  stenosis is present.   5. Aortic dilatation noted. There is mild dilatation of the aortic root,  measuring 38 mm.   6. The inferior vena cava is normal in size with greater than 50%  respiratory variability, suggesting right atrial pressure of 3 mmHg.  - Comparison LVEF: 25-30% with septal, apical, distal anterior wall and inferior hypokinesis 08/13/21, 06/20/20; 35% 06/22/19, 35-40% 03/03/17; 30-35% 12/18/16, 08/07/16, 45-50% 07/07/11; 30-35% 02/22/08 in setting of  anterior STEMI      Nuclear stress test 11/11/2021:   Findings are consistent with large prior anteroseptal  myocardial infarction. There is no current ischemia.  The study is high risk based on decreased LVEF alone, there is no current myocardium at jeopardy. Consider correlating LVEF with echo.   No ST deviation was noted.   LV perfusion is abnormal.   Left ventricular function is abnormal. Nuclear stress EF: 24 %. End diastolic cavity size is severely enlarged.     RHC/LHC 08/21/2016: Mid RCA-2 lesion, 30 %stenosed. Mid RCA-1 lesion, 30 %stenosed. Ost Ramus to Ramus lesion, 20 %stenosed. Ost LAD to Prox LAD lesion, 10 %stenosed. Prox LAD to Mid LAD lesion, 60 %stenosed. A STENT SYNERGY DES 2.25X12 drug eluting stent was successfully placed. 2nd Diag lesion, 99 %stenosed. Post intervention, there is a 0% residual stenosis.   1. Patent stent proximal LAD 2. Moderate stenosis mid LAD. FFR was 0.84 suggesting the stenosis was not flow limiting.  3. The Diagonal branch is a moderate caliber vessel with a 99% stenosis.  4. Successful PTCA/DES x 1 Diagonal branch 5. Mild disease RCA   Recommendations: Will continue ASA and Plavix for now. He will need to be started on coumadin tomorrow before discharge for his LV thrombus. He can stop ASA when his  INR is therapeutic on coumadin since he was not an ACS. I would plan long term coumadin for LV thrombus and Plavix for at least one year given placement of DES. Continue statin and beta blocker.      Past Medical History:  Diagnosis Date   Anxiety    Arthritis    in lower back   Chronic heart failure (HCC)    Complication of anesthesia    with mask and gas   Coronary artery disease    a. 02/2008: anterior STEMI s/p DES to prox LAD, b. Myoview 2012: scar in the anterior wall with mild ischemia and EF 33%, c. neg GXT 06/2011  d. 08/2016: cath with DES to diagonal    Depression with anxiety    Diabetes mellitus    TYPE II   Dyspnea    with  exertion   ED (erectile dysfunction)    Fatty liver    H/O ELEVATED LIVER ENZYMES   Headache    High cholesterol    Hyperlipidemia    Hypertension    Ischemic cardiomyopathy 08/14/2016   a. Previous EF 33-35% 2012, b. 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 [facility])    Myocardial infarction (HCC)    Noncompliance    Prior hx of med noncompliance   PONV (postoperative nausea and vomiting)    Short-term memory loss    per pt.   Sleep apnea    unable to tolerate CPAP    Past Surgical History:  Procedure Laterality Date   CORONARY ANGIOPLASTY     CORONARY PRESSURE/FFR STUDY N/A 08/21/2016   Procedure: INTRAVASCULAR PRESSURE WIRE/FFR STUDY;  Surgeon: Kathleene Hazel, MD;  [facility];  Service: Cardiovascular;  Laterality: N/A;   CORONARY STENT INTERVENTION N/A 08/21/2016   Procedure: CORONARY STENT INTERVENTION;  Surgeon: Kathleene Hazel, MD;  [facility];  Service: Cardiovascular;  Laterality: N/A;   CYSTOSCOPY N/A 12/04/2019   Procedure: CYSTOSCOPY;  Surgeon: Malen Gauze, MD;  Location: [facility]S;  Service: Urology;  Laterality: N/A;   DRUG INDUCED ENDOSCOPY Bilateral 06/24/2022   Procedure: DRUG INDUCED SLEEP ENDOSCOPY;  Surgeon: Christia Reading, MD;  Location: [facility] OR;  Service: ENT;  Laterality: Bilateral;   LIVER BIOPSY     MI WITH STENTS     NASAL SEPTOPLASTY W/ TURBINOPLASTY N/A 01/25/2019   Procedure: BILATERAL TURBINATE REDUCTION;  Surgeon: Newman Pies, MD;  Location: [facility];  Service: ENT;  Laterality: N/A;   NASAL SINUS SURGERY     RIGHT/LEFT HEART CATH AND CORONARY ANGIOGRAPHY N/A 08/21/2016   Procedure: Right/Left Heart Cath and Coronary Angiography;  Surgeon: Kathleene Hazel, MD;  [facility];  Service: Cardiovascular;  Laterality: N/A;   TRANSURETHRAL RESECTION OF PROSTATE N/A 12/04/2019   Procedure: TRANSURETHRAL RESECTION OF THE PROSTATE (TURP);   Surgeon: Malen Gauze, MD;  Location: [facility]S;  Service: Urology;  Laterality: N/A;    MEDICATIONS: No current facility-administered medications for this encounter.    acetaminophen (TYLENOL) 650 MG CR tablet   aspirin EC 81 MG tablet   atorvastatin (LIPITOR) 80 MG tablet   carvedilol (COREG) 25 MG tablet   dapagliflozin propanediol (FARXIGA) 10 MG TABS tablet   desloratadine (CLARINEX) 5 MG tablet   escitalopram (LEXAPRO) 20 MG tablet   fluticasone (FLONASE) 50 MCG/ACT nasal spray   glipiZIDE (GLUCOTROL XL) 10 MG 24 hr tablet   nitroGLYCERIN (NITROSTAT) 0.4 MG SL tablet  sacubitril-valsartan (ENTRESTO) 97-103 MG   Semaglutide, 1 MG/DOSE, (OZEMPIC, 1 MG/DOSE,) 2 MG/1.5ML SOPN   solifenacin (VESICARE) 5 MG tablet   spironolactone (ALDACTONE) 25 MG tablet   tamsulosin (FLOMAX) 0.4 MG CAPS capsule   TOUJEO MAX SOLOSTAR 300 UNIT/ML Solostar Pen   Vibegron (GEMTESA) 75 MG TABS   AMBULATORY NON FORMULARY MEDICATION   BD PEN NEEDLE NANO 2ND GEN 32G X 4 MM MISC    Shonna Chock, PA-C Surgical Short Stay/Anesthesiology [facility] Phone [phone] [facility] Phone [phone] 11/17/2022 11:57 AM

## 2022-11-18 ENCOUNTER — Ambulatory Visit (HOSPITAL_COMMUNITY): Payer: 59 | Admitting: Vascular Surgery

## 2022-11-18 ENCOUNTER — Ambulatory Visit (HOSPITAL_COMMUNITY)
Admission: RE | Admit: 2022-11-18 | Discharge: 2022-11-18 | Disposition: A | Discharge status: Home / Self Care | Payer: 59 | Attending: Otolaryngology | Admitting: Otolaryngology

## 2022-11-18 ENCOUNTER — Other Ambulatory Visit: Payer: Self-pay

## 2022-11-18 ENCOUNTER — Encounter (HOSPITAL_COMMUNITY): Payer: Self-pay | Admitting: Otolaryngology

## 2022-11-18 ENCOUNTER — Ambulatory Visit (HOSPITAL_COMMUNITY): Payer: 59

## 2022-11-18 ENCOUNTER — Encounter (HOSPITAL_COMMUNITY)
Admission: RE | Disposition: A | Discharge status: Home / Self Care | Payer: Self-pay | Source: Home / Self Care | Attending: Otolaryngology

## 2022-11-18 ENCOUNTER — Ambulatory Visit (HOSPITAL_BASED_OUTPATIENT_CLINIC_OR_DEPARTMENT_OTHER): Payer: 59 | Admitting: Vascular Surgery

## 2022-11-18 DIAGNOSIS — I5022 Chronic systolic (congestive) heart failure: Secondary | ICD-10-CM | POA: Insufficient documentation

## 2022-11-18 DIAGNOSIS — I2510 Atherosclerotic heart disease of native coronary artery without angina pectoris: Secondary | ICD-10-CM | POA: Insufficient documentation

## 2022-11-18 DIAGNOSIS — Z7985 Long-term (current) use of injectable non-insulin antidiabetic drugs: Secondary | ICD-10-CM | POA: Diagnosis not present

## 2022-11-18 DIAGNOSIS — G4733 Obstructive sleep apnea (adult) (pediatric): Secondary | ICD-10-CM

## 2022-11-18 DIAGNOSIS — I252 Old myocardial infarction: Secondary | ICD-10-CM | POA: Diagnosis not present

## 2022-11-18 DIAGNOSIS — Z955 Presence of coronary angioplasty implant and graft: Secondary | ICD-10-CM | POA: Insufficient documentation

## 2022-11-18 DIAGNOSIS — F418 Other specified anxiety disorders: Secondary | ICD-10-CM | POA: Diagnosis not present

## 2022-11-18 DIAGNOSIS — N400 Enlarged prostate without lower urinary tract symptoms: Secondary | ICD-10-CM | POA: Diagnosis not present

## 2022-11-18 DIAGNOSIS — Z6834 Body mass index (BMI) 34.0-34.9, adult: Secondary | ICD-10-CM | POA: Diagnosis not present

## 2022-11-18 DIAGNOSIS — Z7984 Long term (current) use of oral hypoglycemic drugs: Secondary | ICD-10-CM | POA: Diagnosis not present

## 2022-11-18 DIAGNOSIS — E119 Type 2 diabetes mellitus without complications: Secondary | ICD-10-CM | POA: Insufficient documentation

## 2022-11-18 DIAGNOSIS — Z608 Other problems related to social environment: Secondary | ICD-10-CM | POA: Insufficient documentation

## 2022-11-18 DIAGNOSIS — K760 Fatty (change of) liver, not elsewhere classified: Secondary | ICD-10-CM | POA: Diagnosis not present

## 2022-11-18 DIAGNOSIS — I110 Hypertensive heart disease with heart failure: Secondary | ICD-10-CM | POA: Diagnosis not present

## 2022-11-18 DIAGNOSIS — Z794 Long term (current) use of insulin: Secondary | ICD-10-CM | POA: Insufficient documentation

## 2022-11-18 DIAGNOSIS — E66811 Obesity, class 1: Secondary | ICD-10-CM | POA: Diagnosis not present

## 2022-11-18 DIAGNOSIS — I255 Ischemic cardiomyopathy: Secondary | ICD-10-CM | POA: Diagnosis not present

## 2022-11-18 HISTORY — DX: Male erectile dysfunction, unspecified: N52.9

## 2022-11-18 HISTORY — PX: IMPLANTATION OF HYPOGLOSSAL NERVE STIMULATOR: SHX6827

## 2022-11-18 HISTORY — DX: Other amnesia: R41.3

## 2022-11-18 LAB — COMPREHENSIVE METABOLIC PANEL
ALT: 43 U/L (ref 0–44)
AST: 27 U/L (ref 15–41)
Albumin: 3.6 g/dL (ref 3.5–5.0)
Alkaline Phosphatase: 59 U/L (ref 38–126)
Anion gap: 11 (ref 5–15)
BUN: 13 mg/dL (ref 6–20)
CO2: 23 mmol/L (ref 22–32)
Calcium: 9.6 mg/dL (ref 8.9–10.3)
Chloride: 105 mmol/L (ref 98–111)
Creatinine, Ser: 0.86 mg/dL (ref 0.61–1.24)
GFR, Estimated: >60 mL/min (ref >60)
Glucose, Bld: 103 mg/dL — ABNORMAL HIGH (ref 70–99)
Potassium: 3.9 mmol/L (ref 3.5–5.1)
Sodium: 139 mmol/L (ref 135–145)
Total Bilirubin: 0.4 mg/dL (ref 0.3–1.2)
Total Protein: 6.6 g/dL (ref 6.5–8.1)

## 2022-11-18 LAB — CBC
HCT: 47 % (ref 39.0–52.0)
Hemoglobin: 15.9 g/dL (ref 13.0–17.0)
MCH: 29.7 pg (ref 26.0–34.0)
MCHC: 33.8 g/dL (ref 30.0–36.0)
MCV: 87.9 fL (ref 80.0–100.0)
Platelets: 358 10*3/uL (ref 150–400)
RBC: 5.35 MIL/uL (ref 4.22–5.81)
RDW: 13 % (ref 11.5–15.5)
WBC: 7.5 10*3/uL (ref 4.0–10.5)
nRBC: 0 % (ref 0.0–0.2)

## 2022-11-18 LAB — GLUCOSE, CAPILLARY
Glucose-Capillary: 109 mg/dL — ABNORMAL HIGH (ref 70–99)
Glucose-Capillary: 103 mg/dL — ABNORMAL HIGH (ref 70–99)
Glucose-Capillary: 127 mg/dL — ABNORMAL HIGH (ref 70–99)

## 2022-11-18 SURGERY — INSERTION, HYPOGLOSSAL NERVE STIMULATOR
Anesthesia: General | Laterality: Right

## 2022-11-18 MED ORDER — PHENYLEPHRINE 80 MCG/ML (10ML) SYRINGE FOR IV PUSH (FOR BLOOD PRESSURE SUPPORT)
PREFILLED_SYRINGE | INTRAVENOUS | Status: DC | PRN
  Administered 2022-11-18 (×3): 80 ug via INTRAVENOUS

## 2022-11-18 MED ORDER — LIDOCAINE 2% (20 MG/ML) 5 ML SYRINGE
INTRAMUSCULAR | Status: DC | PRN
  Administered 2022-11-18 (×2): 40 mg via INTRAVENOUS

## 2022-11-18 MED ORDER — ETOMIDATE 2 MG/ML IV SOLN
INTRAVENOUS | Status: AC
  Filled 2022-11-18: qty 10

## 2022-11-18 MED ORDER — MIDAZOLAM HCL 2 MG/2ML IJ SOLN
INTRAMUSCULAR | Status: DC | PRN
  Administered 2022-11-18: 2 mg via INTRAVENOUS

## 2022-11-18 MED ORDER — CHLORHEXIDINE GLUCONATE 0.12 % MT SOLN
15.0000 mL | Freq: Once | OROMUCOSAL | Status: AC
  Administered 2022-11-18: 15 mL via OROMUCOSAL
  Filled 2022-11-18: qty 15

## 2022-11-18 MED ORDER — SUCCINYLCHOLINE CHLORIDE 200 MG/10ML IV SOSY
PREFILLED_SYRINGE | INTRAVENOUS | Status: DC | PRN
  Administered 2022-11-18: 120 mg via INTRAVENOUS

## 2022-11-18 MED ORDER — PROPOFOL 1000 MG/100ML IV EMUL
INTRAVENOUS | Status: AC
  Filled 2022-11-18: qty 100

## 2022-11-18 MED ORDER — ETOMIDATE 2 MG/ML IV SOLN
INTRAVENOUS | Status: DC | PRN
Start: 2022-11-18 — End: 2022-11-18
  Administered 2022-11-18: 14 mg via INTRAVENOUS

## 2022-11-18 MED ORDER — INSULIN ASPART 100 UNIT/ML IJ SOLN: 0.0000 [IU] | INTRAMUSCULAR | Status: DC | PRN

## 2022-11-18 MED ORDER — DEXAMETHASONE SODIUM PHOSPHATE 10 MG/ML IJ SOLN
INTRAMUSCULAR | Status: DC | PRN
  Administered 2022-11-18: 10 mg via INTRAVENOUS

## 2022-11-18 MED ORDER — MIDAZOLAM HCL 2 MG/2ML IJ SOLN
INTRAMUSCULAR | Status: AC
  Filled 2022-11-18: qty 2

## 2022-11-18 MED ORDER — LIDOCAINE-EPINEPHRINE 1 %-1:100000 IJ SOLN
INTRAMUSCULAR | Status: DC | PRN
  Administered 2022-11-18: 5 mL

## 2022-11-18 MED ORDER — SODIUM CHLORIDE 0.9% FLUSH: 10.0000 mL | Freq: Two times a day (BID) | INTRAVENOUS | Status: DC

## 2022-11-18 MED ORDER — HYDROCODONE-ACETAMINOPHEN 5-325 MG PO TABS: 1.0000 | ORAL_TABLET | Freq: Four times a day (QID) | ORAL | 0 refills | Status: DC | PRN

## 2022-11-18 MED ORDER — ONDANSETRON HCL 4 MG/2ML IJ SOLN
INTRAMUSCULAR | Status: DC | PRN
  Administered 2022-11-18: 4 mg via INTRAVENOUS

## 2022-11-18 MED ORDER — PROPOFOL 10 MG/ML IV BOLUS
INTRAVENOUS | Status: DC | PRN
  Administered 2022-11-18: 125 ug/kg/min via INTRAVENOUS

## 2022-11-18 MED ORDER — OXYCODONE HCL 5 MG PO TABS
ORAL_TABLET | ORAL | Status: AC
  Filled 2022-11-18: qty 1

## 2022-11-18 MED ORDER — CEFAZOLIN SODIUM-DEXTROSE 2-4 GM/100ML-% IV SOLN
2.0000 g | INTRAVENOUS | Status: AC
  Administered 2022-11-18: 2 g via INTRAVENOUS
  Filled 2022-11-18: qty 100

## 2022-11-18 MED ORDER — PHENYLEPHRINE HCL-NACL 20-0.9 MG/250ML-% IV SOLN
INTRAVENOUS | Status: DC | PRN
  Administered 2022-11-18: 30 ug/min via INTRAVENOUS

## 2022-11-18 MED ORDER — OXYCODONE HCL 5 MG PO TABS
5.0000 mg | ORAL_TABLET | Freq: Once | ORAL | Status: AC
  Administered 2022-11-18: 5 mg via ORAL

## 2022-11-18 MED ORDER — LACTATED RINGERS IV SOLN: INTRAVENOUS | Status: DC | PRN

## 2022-11-18 MED ORDER — FENTANYL CITRATE (PF) 250 MCG/5ML IJ SOLN
INTRAMUSCULAR | Status: AC
  Filled 2022-11-18: qty 5

## 2022-11-18 MED ORDER — ORAL CARE MOUTH RINSE: 15.0000 mL | Freq: Once | OROMUCOSAL | Status: AC

## 2022-11-18 MED ORDER — FENTANYL CITRATE (PF) 250 MCG/5ML IJ SOLN
INTRAMUSCULAR | Status: DC | PRN
  Administered 2022-11-18: 100 ug via INTRAVENOUS

## 2022-11-18 MED ORDER — PROPOFOL 10 MG/ML IV BOLUS
INTRAVENOUS | Status: AC
  Filled 2022-11-18: qty 20

## 2022-11-18 MED ORDER — LIDOCAINE-EPINEPHRINE 1 %-1:100000 IJ SOLN
INTRAMUSCULAR | Status: AC
  Filled 2022-11-18: qty 1

## 2022-11-18 SURGICAL SUPPLY — 68 items
ACC NRSTM 4 TRQ WRNCH STRL (MISCELLANEOUS)
ADH SKN CLS APL DERMABOND .7 (GAUZE/BANDAGES/DRESSINGS) ×2
BAG COUNTER SPONGE SURGICOUNT (BAG) ×1 IMPLANT
BAG SPNG CNTER NS LX DISP (BAG) ×1
BLADE CLIPPER SURG (BLADE) IMPLANT
BLADE SURG 15 STRL LF DISP TIS (BLADE) ×3 IMPLANT
BLADE SURG 15 STRL SS (BLADE) ×3
CANISTER SUCT 3000ML PPV (MISCELLANEOUS) ×1 IMPLANT
CORD BIPOLAR FORCEPS 12FT (ELECTRODE) ×1 IMPLANT
COVER PROBE W GEL 5X96 (DRAPES) ×1 IMPLANT
COVER SURGICAL LIGHT HANDLE (MISCELLANEOUS) ×1 IMPLANT
DERMABOND ADVANCED .7 DNX12 (GAUZE/BANDAGES/DRESSINGS) ×2 IMPLANT
DRAPE C-ARM 35X43 STRL (DRAPES) ×1 IMPLANT
DRAPE HEAD BAR (DRAPES) ×1 IMPLANT
DRAPE INCISE IOBAN 66X45 STRL (DRAPES) ×1 IMPLANT
DRAPE MICROSCOPE LEICA 54X105 (DRAPES) ×1 IMPLANT
DRAPE UTILITY XL STRL (DRAPES) ×1 IMPLANT
DRSG TEGADERM 4X4.75 (GAUZE/BANDAGES/DRESSINGS) ×3 IMPLANT
ELECT COATED BLADE 2.86 ST (ELECTRODE) ×1 IMPLANT
ELECT EMG 18 NIMS (NEUROSURGERY SUPPLIES) ×1
ELECT REM PT RETURN 9FT ADLT (ELECTROSURGICAL) ×1
ELECTRODE EMG 18 NIMS (NEUROSURGERY SUPPLIES) ×1 IMPLANT
ELECTRODE REM PT RTRN 9FT ADLT (ELECTROSURGICAL) ×1 IMPLANT
FORCEPS BIPOLAR SPETZLER 8 1.0 (NEUROSURGERY SUPPLIES) ×1 IMPLANT
GAUZE 4X4 16PLY ~~LOC~~+RFID DBL (SPONGE) ×1 IMPLANT
GAUZE SPONGE 4X4 12PLY STRL (GAUZE/BANDAGES/DRESSINGS) ×1 IMPLANT
GENERATOR PULSE INSPIRE (Generator) ×1 IMPLANT
GENERATOR PULSE INSPIRE IV (Generator) ×1 IMPLANT
GLOVE BIO SURGEON STRL SZ 6.5 (GLOVE) IMPLANT
GLOVE BIO SURGEON STRL SZ7.5 (GLOVE) ×1 IMPLANT
GOWN STRL REUS W/ TWL LRG LVL3 (GOWN DISPOSABLE) ×3 IMPLANT
GOWN STRL REUS W/TWL LRG LVL3 (GOWN DISPOSABLE) ×3
KIT BASIN OR (CUSTOM PROCEDURE TRAY) ×1 IMPLANT
KIT NEURO ACCESSORY W/WRENCH (MISCELLANEOUS) IMPLANT
KIT TURNOVER KIT B (KITS) ×1 IMPLANT
LEAD SENSING RESP INSPIRE (Lead) ×1 IMPLANT
LEAD SENSING RESP INSPIRE IV (Lead) ×1 IMPLANT
LEAD SLEEP STIM INSPIRE IV/V (Lead) ×1 IMPLANT
LEAD SLEEP STIMULATION INSPIRE (Lead) ×1 IMPLANT
LOOP VASCLR MAXI BLUE 18IN ST (MISCELLANEOUS) ×1 IMPLANT
MARKER SKIN DUAL TIP RULER LAB (MISCELLANEOUS) ×2 IMPLANT
NDL HYPO 25GX1X1/2 BEV (NEEDLE) ×1 IMPLANT
NEEDLE HYPO 25GX1X1/2 BEV (NEEDLE) ×1
NS IRRIG 1000ML POUR BTL (IV SOLUTION) ×1 IMPLANT
PAD ARMBOARD 7.5X6 YLW CONV (MISCELLANEOUS) ×1 IMPLANT
PASSER CATH 38CM DISP (INSTRUMENTS) ×1 IMPLANT
PENCIL SMOKE EVACUATOR (MISCELLANEOUS) ×1 IMPLANT
POSITIONER HEAD DONUT 9IN (MISCELLANEOUS) ×1 IMPLANT
PROBE NERVE STIMULATOR (NEUROSURGERY SUPPLIES) ×1 IMPLANT
REMOTE CONTROL SLEEP INSPIRE (MISCELLANEOUS) ×1 IMPLANT
SET WALTER ACTIVATION W/DRAPE (SET/KITS/TRAYS/PACK) ×1 IMPLANT
SPONGE INTESTINAL PEANUT (DISPOSABLE) ×1 IMPLANT
STAPLER VISISTAT 35W (STAPLE) ×1 IMPLANT
SUT SILK 2 0 SH (SUTURE) ×1 IMPLANT
SUT SILK 3 0 REEL (SUTURE) ×1 IMPLANT
SUT SILK 3 0 SH 30 (SUTURE) ×2 IMPLANT
SUT SILK 3-0 (SUTURE) ×1
SUT SILK 3-0 RB1 30XBRD (SUTURE) ×1
SUT VIC AB 3-0 SH 27 (SUTURE) ×2
SUT VIC AB 3-0 SH 27X BRD (SUTURE) ×2 IMPLANT
SUT VIC AB 4-0 PS2 27 (SUTURE) ×2 IMPLANT
SUTURE SILK 3-0 RB1 30XBRD (SUTURE) ×1 IMPLANT
SYR 10ML LL (SYRINGE) ×1 IMPLANT
TAPE CLOTH SURG 4X10 WHT LF (GAUZE/BANDAGES/DRESSINGS) ×1 IMPLANT
TOWEL GREEN STERILE (TOWEL DISPOSABLE) ×1 IMPLANT
TRAY ENT MC OR (CUSTOM PROCEDURE TRAY) ×1 IMPLANT
VASCULAR TIE MAXI BLUE 18IN ST (MISCELLANEOUS) ×1
VASCULAR TIE MINI RED 18IN STL (MISCELLANEOUS) ×1 IMPLANT

## 2022-11-18 NOTE — Plan of Care (Signed)
 [facility] Tonsillectomy/Adenoidectomy, Postoperative PEDS care plan entered in error.

## 2022-11-18 NOTE — Anesthesia Procedure Notes (Signed)
Procedure Name: Intubation Date/Time: 11/18/2022 10:49 AM  Performed by: Jimmey Ralph, CRNAPre-anesthesia Checklist: Patient identified, Emergency Drugs available, Suction available and Patient being monitored Patient Re-evaluated:Patient Re-evaluated prior to induction Oxygen Delivery Method: Circle system utilized Preoxygenation: Pre-oxygenation with 100% oxygen Induction Type: IV induction Ventilation: Mask ventilation without difficulty Laryngoscope Size: Glidescope and 4 Grade View: Grade I Tube type: Oral Tube size: 7.5 mm Number of attempts: 1 Airway Equipment and Method: Stylet and Oral airway Placement Confirmation: ETT inserted through vocal cords under direct vision, positive ETCO2 and breath sounds checked- equal and bilateral Secured at: 22 cm Tube secured with: Tape Dental Injury: Teeth and Oropharynx as per pre-operative assessment  Comments: Atraumatic oral intubation; teeth and oropharynx as per pre-operative assessment.

## 2022-11-18 NOTE — Transfer of Care (Signed)
Immediate Anesthesia Transfer of Care Note  Patient: [name] [name].  Procedure(s) Performed: IMPLANTATION OF HYPOGLOSSAL NERVE STIMULATOR (Right)  Patient Location: PACU  Anesthesia Type:General  Level of Consciousness: drowsy  Airway & Oxygen Therapy: Patient Spontanous Breathing and Patient connected to face mask oxygen  Post-op Assessment: Report given to RN and Post -op Vital signs reviewed and stable  Post vital signs: Reviewed and stable  Last Vitals:  Vitals Value Taken Time  BP 122/83 11/18/22 1315  Temp 36.6 C 11/18/22 1315  Pulse 78 11/18/22 1318  Resp 14 11/18/22 1318  SpO2 98 % 11/18/22 1318  Vitals shown include unfiled device data.  Last Pain:  Vitals:   11/18/22 1315  PainSc: Asleep         Complications: No notable events documented.

## 2022-11-18 NOTE — Op Note (Signed)

## 2022-11-18 NOTE — H&P (Signed)
[name] [name]. is an 57 y.o. male.   Chief Complaint: Sleep apnea HPI: 57 year old male with sleep apnea who has been unable to tolerate CPAP.  Past Medical History:  Diagnosis Date   Anxiety    Arthritis    in lower back   Chronic heart failure (HCC)    Complication of anesthesia    with mask and gas   Coronary artery disease    a. 02/2008: anterior STEMI s/p DES to prox LAD, b. Myoview 2012: scar in the anterior wall with mild ischemia and EF 33%, c. neg GXT 06/2011  d. 08/2016: cath with DES to diagonal    Depression with anxiety    Diabetes mellitus    TYPE II   Dyspnea    with exertion   ED (erectile dysfunction)    Fatty liver    H/O ELEVATED LIVER ENZYMES   Headache    High cholesterol    Hyperlipidemia    Hypertension    Ischemic cardiomyopathy 08/14/2016   a. Previous EF 33-35% 2012, b. 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 [facility])    Myocardial infarction (HCC)    Noncompliance    Prior hx of med noncompliance   PONV (postoperative nausea and vomiting)    Short-term memory loss    per pt.   Sleep apnea    unable to tolerate CPAP    Past Surgical History:  Procedure Laterality Date   CORONARY ANGIOPLASTY     CORONARY PRESSURE/FFR STUDY N/A 08/21/2016   Procedure: INTRAVASCULAR PRESSURE WIRE/FFR STUDY;  Surgeon: Kathleene Hazel, MD;  [facility];  Service: Cardiovascular;  Laterality: N/A;   CORONARY STENT INTERVENTION N/A 08/21/2016   Procedure: CORONARY STENT INTERVENTION;  Surgeon: Kathleene Hazel, MD;  [facility];  Service: Cardiovascular;  Laterality: N/A;   CYSTOSCOPY N/A 12/04/2019   Procedure: CYSTOSCOPY;  Surgeon: Malen Gauze, MD;  Location: [facility]S;  Service: Urology;  Laterality: N/A;   DRUG INDUCED ENDOSCOPY Bilateral 06/24/2022   Procedure: DRUG INDUCED SLEEP ENDOSCOPY;  Surgeon: Christia Reading, MD;  Location: [facility] OR;  Service: ENT;  Laterality:  Bilateral;   LIVER BIOPSY     MI WITH STENTS     NASAL SEPTOPLASTY W/ TURBINOPLASTY N/A 01/25/2019   Procedure: BILATERAL TURBINATE REDUCTION;  Surgeon: Newman Pies, MD;  Location[facility];  Service: ENT;  Laterality: N/A;   NASAL SINUS SURGERY     RIGHT/LEFT HEART CATH AND CORONARY ANGIOGRAPHY N/A 08/21/2016   Procedure: Right/Left Heart Cath and Coronary Angiography;  Surgeon: Kathleene Hazel, MD;  [facility];  Service: Cardiovascular;  Laterality: N/A;   TRANSURETHRAL RESECTION OF PROSTATE N/A 12/04/2019   Procedure: TRANSURETHRAL RESECTION OF THE PROSTATE (TURP);  Surgeon: Malen Gauze, MD;  Location: [facility];  Service: Urology;  Laterality: N/A;    Family History  Problem Relation Age of Onset   Coronary artery disease Other        UNKNOWN   Heart disease Father        CAD s/p CABG   Diabetes Mellitus II Father    Diabetes Mellitus II Mother    Cancer Brother    Social History:  reports that he has never smoked. He has never been exposed to tobacco smoke. He has never used smokeless tobacco. He reports that he does not drink alcohol and does not use drugs.  Allergies: No Known  Allergies  Medications Prior to Admission  Medication Sig Dispense Refill   acetaminophen (TYLENOL) 650 MG CR tablet Take 1,300-1,950 mg by mouth daily as needed for pain.     aspirin EC 81 MG tablet Take 81 mg by mouth daily.     atorvastatin (LIPITOR) 80 MG tablet Take 1 tablet by mouth once daily 90 tablet 1   carvedilol (COREG) 25 MG tablet Take 25 mg by mouth 2 (two) times daily.     dapagliflozin propanediol (FARXIGA) 10 MG TABS tablet Take 10 mg by mouth daily.     desloratadine (CLARINEX) 5 MG tablet Take 1 tablet (5 mg total) by mouth daily. (Patient taking differently: Take 5 mg by mouth daily as needed (allergies).) 90 tablet 3   escitalopram (LEXAPRO) 20 MG tablet Take 20 mg by mouth daily.     fluticasone (FLONASE) 50 MCG/ACT nasal spray Place 2 sprays into both  nostrils daily. 16 g 6   glipiZIDE (GLUCOTROL XL) 10 MG 24 hr tablet Take 10 mg by mouth daily.     nitroGLYCERIN (NITROSTAT) 0.4 MG SL tablet Place 0.4 mg under the tongue every 5 (five) minutes as needed for chest pain.     sacubitril-valsartan (ENTRESTO) 97-103 MG Take 1 tablet by mouth twice daily 60 tablet 2   Semaglutide, 1 MG/DOSE, (OZEMPIC, 1 MG/DOSE,) 2 MG/1.5ML SOPN Inject 1 mg into the skin every Saturday.     solifenacin (VESICARE) 5 MG tablet Take 1 tablet (5 mg total) by mouth daily. 30 tablet 11   spironolactone (ALDACTONE) 25 MG tablet Take 0.5 tablets (12.5 mg total) by mouth daily. 45 tablet 3   tamsulosin (FLOMAX) 0.4 MG CAPS capsule Take 1 capsule (0.4 mg total) by mouth daily after supper. 30 capsule 11   TOUJEO MAX SOLOSTAR 300 UNIT/ML Solostar Pen Inject 72 Units into the skin daily.     Vibegron (GEMTESA) 75 MG TABS Take 1 tablet (75 mg total) by mouth daily. 30 tablet 11   AMBULATORY NON FORMULARY MEDICATION 0.2 mLs by Intracavernosal route as needed. Medication Name: Trimix  PGE Pap 30mg  Phent 1mg  5 mL 5   BD PEN NEEDLE NANO 2ND GEN 32G X 4 MM MISC USE AS DIRECTED ONCE DAILY WITH SOLIQUA      Results for orders placed or performed during the hospital encounter of 11/18/22 (from the past 48 hour(s))  CBC per protocol     Status: None   Collection Time: 11/18/22  7:53 AM  Result Value Ref Range   WBC 7.5 4.0 - 10.5 K/uL   RBC 5.35 4.22 - 5.81 MIL/uL   Hemoglobin 15.9 13.0 - 17.0 g/dL   HCT 13.0 86.5 - 78.4 %   MCV 87.9 80.0 - 100.0 fL   MCH 29.7 26.0 - 34.0 pg   MCHC 33.8 30.0 - 36.0 g/dL   RDW 69.6 29.5 - 28.4 %   Platelets 358 150 - 400 K/uL   nRBC 0.0 0.0 - 0.2 %    Comment: Performed at [facility], [address] [place], [place] [address]  Glucose, capillary     Status: Abnormal   Collection Time: 11/18/22  8:15 AM  Result Value Ref Range   Glucose-Capillary 109 (H) 70 - 99 mg/dL    Comment: Glucose reference range applies only to  samples taken after fasting for at least 8 hours.   No results found.  Review of Systems  All other systems reviewed and are negative.   Blood pressure Marland Kitchen)  151/97, pulse 82, temperature 98.2 F (36.8 C), resp. rate 18, height 5\' 10"  (1.778 m), weight 108.9 kg, SpO2 (!) 81%. Physical Exam Constitutional:      Appearance: Normal appearance.  HENT:     Head: Normocephalic and atraumatic.     Right Ear: External ear normal.     Left Ear: External ear normal.     Nose: Nose normal.     Mouth/Throat:     Mouth: Mucous membranes are moist.     Pharynx: Oropharynx is clear.  Eyes:     Extraocular Movements: Extraocular movements intact.     Conjunctiva/sclera: Conjunctivae normal.     Pupils: Pupils are equal, round, and reactive to light.  Cardiovascular:     Rate and Rhythm: Normal rate.  Pulmonary:     Effort: Pulmonary effort is normal.  Musculoskeletal:     Cervical back: Normal range of motion.  Skin:    General: Skin is warm and dry.  Neurological:     General: No focal deficit present.     Mental Status: He is alert and oriented to person, place, and time.  Psychiatric:        Mood and Affect: Mood normal.        Behavior: Behavior normal.        Thought Content: Thought content normal.        Judgment: Judgment normal.      Assessment/Plan Obstructive sleep apnea and BMI 34.44  To OR for hypoglossal nerve stimulator placement.  Christia Reading, MD 11/18/2022, 9:54 AM

## 2022-11-18 NOTE — Brief Op Note (Signed)
11/18/2022  1:09 PM  PATIENT:  [name] [name].  57 y.o. male  PRE-OPERATIVE DIAGNOSIS:  obstructive sleep apnea  POST-OPERATIVE DIAGNOSIS:  obstructive sleep apnea  PROCEDURE:  Procedure(s): IMPLANTATION OF HYPOGLOSSAL NERVE STIMULATOR (Right)  SURGEON:  Surgeons and Role:    Christia Reading, MD - Primary  PHYSICIAN ASSISTANT:   ASSISTANTS: RNFA   ANESTHESIA:   general  EBL:  2 mL   BLOOD ADMINISTERED:none  DRAINS: none   LOCAL MEDICATIONS USED:  LIDOCAINE   SPECIMEN:  No Specimen  DISPOSITION OF SPECIMEN:  N/A  COUNTS:  YES  TOURNIQUET:  * No tourniquets in log *  DICTATION: .Note written in EPIC  PLAN OF CARE: Discharge to home after PACU  PATIENT DISPOSITION:  PACU - hemodynamically stable.   Delay start of Pharmacological VTE agent (>24hrs) due to surgical blood loss or risk of bleeding: no

## 2022-11-19 ENCOUNTER — Encounter (HOSPITAL_COMMUNITY): Payer: Self-pay | Admitting: Otolaryngology

## 2022-11-19 NOTE — Anesthesia Postprocedure Evaluation (Signed)
Anesthesia Post Note  Patient: [name] [name].  Procedure(s) Performed: IMPLANTATION OF HYPOGLOSSAL NERVE STIMULATOR (Right)     Patient location during evaluation: PACU Anesthesia Type: General Level of consciousness: awake and alert Pain management: pain level controlled Vital Signs Assessment: post-procedure vital signs reviewed and stable Respiratory status: spontaneous breathing, nonlabored ventilation, respiratory function stable and patient connected to nasal cannula oxygen Cardiovascular status: blood pressure returned to baseline and stable Postop Assessment: no apparent nausea or vomiting Anesthetic complications: no   No notable events documented.                Shelton Silvas

## 2022-11-30 ENCOUNTER — Encounter: Payer: Self-pay | Admitting: Nurse Practitioner

## 2022-11-30 ENCOUNTER — Ambulatory Visit: Payer: 59 | Attending: Nurse Practitioner | Admitting: Nurse Practitioner

## 2022-11-30 VITALS — BP 112/78 | HR 85 | Ht 70.0 in | Wt 244.6 lb

## 2022-11-30 DIAGNOSIS — G4733 Obstructive sleep apnea (adult) (pediatric): Secondary | ICD-10-CM

## 2022-11-30 DIAGNOSIS — I2510 Atherosclerotic heart disease of native coronary artery without angina pectoris: Secondary | ICD-10-CM

## 2022-11-30 DIAGNOSIS — I5022 Chronic systolic (congestive) heart failure: Secondary | ICD-10-CM

## 2022-11-30 DIAGNOSIS — E785 Hyperlipidemia, unspecified: Secondary | ICD-10-CM | POA: Diagnosis not present

## 2022-11-30 DIAGNOSIS — I10 Essential (primary) hypertension: Secondary | ICD-10-CM

## 2022-11-30 NOTE — Progress Notes (Unsigned)
Cardiology Office Note:  .   Date:  11/30/2022 ID:  [name] [name]., DOB 06-Sep-1965, MRN [id number] PCP: Rebekah Chesterfield, NP  [facility] Providers Cardiologist:  [name], MD    History of Present Illness: .   [name] [name]. is a 57 y.o. male with a PMH of chronic systolic CHF, ischemic cardiomyopathy, CAD, hypertension, OSA (followed by Dr. Vassie Loll), hyperlipidemia and history of prior LV thrombus, who presents today for 57-month follow-up. Followed by HF clinic.   Prior CV history of anterior STEMI in 2010, received DES to LAD.  LV EF at that time, 35%.  In 2012, Myoview revealed anterior scar with mild ischemia.  EF in 2013 was 45 to 50%.  GXT at that time was negative for ischemia.  In 2018, echocardiogram revealed EF reduced to 30 to 35%, diffuse hypokinesis with akinesis of anterior and anteroseptal walls, evidence of LV thrombus, was referred for cardiac cath.  See full report below of cardiac catheterization in August 2018, he received drug-eluting stent to D2.  Repeat echo in November 2018 showed EF 35% with apical thrombus.  Repeat echo in February 2019 revealed EF 35 to 40%, restrictive, no clear thrombus.  NST in 2020 showed large anterior/anteroseptal scar, EF 22%. TTE in 2022 EF 25%.  He has been evaluated by EP due to reduced EF and discussed ICD, patient has requested to give this some thought. Most recent Echo 02/2022 revealed EF 25 to 30%, considering barostim.  Last seen by Dr. [name] on April 22, 2022.  Was euvolemic on exam.  Patient noted chronic stable shortness of breath on exertion, NYHA class III symptoms.  Was considering barostim.  Patient stated he had not committed to ICD.  10/19/2022 - Today he presents for 40-month follow-up visit.  He continues to note chronic shortness of breath and fatigue, not sure if fatigue is due to his OSA or heart disease. Says he is pending a surgical procedure for his OSA with Dr. Jenne Pane - implantation of hypoglossal  nerve stimulator, wants to know if Dr. Wyline Mood is okay with this. Does admit to chronic cough for the last year or two that makes him dizzy, believes he is dehydrated. Says he believes his cough is brought on by the weather change. He describes his chest tightness as ongoing for the past several months. Says "it feels like heartburn," located along the center of his chest and along the left side of his chest along his muscle, not associated with exertion. No specific triggers. Goes away on its own, but sometimes can last intermittently all day, does admit to stress. Says shortness of breath can also be triggered randomly when watching TV. Denies any palpitations, syncope, presyncope, orthopnea, PND, swelling or significant weight changes, acute bleeding, or claudication.  11/30/2022 -today he presents for follow-up.  He says he underwent implantation of hypoglossal nerve stimulator for OSA on January 18, 2023.  Tolerating this well.  Says the swelling is subsiding and is closely being followed by Dr. Jenne Pane.  Denies any recurrence of chest pain since I last saw him in the office. Denies any shortness of breath, palpitations, syncope, presyncope, dizziness, orthopnea, PND, swelling or significant weight changes, acute bleeding, or claudication.  ROS: Negative.  See HPI.  Studies Reviewed: Marland Kitchen    EKG: EKG is not ordered today.  EKG was reviewed from October 19, 2022 that revealed normal sinus rhythm, 88 BPM, old MI, no acute ischemic changes.  Echo 02/2022:  1.  Left ventricular ejection fraction, by estimation, is 25 to 30%. The  left ventricle has severely decreased function. The left ventricle  demonstrates global hypokinesis with mid to apical septal akinesis and  akinesis of the true apex. The left  ventricular internal cavity size was mildly dilated. Left ventricular  diastolic parameters are consistent with Grade I diastolic dysfunction  (impaired relaxation).   2. Right ventricular systolic  function is mildly reduced. The right  ventricular size is normal. Tricuspid regurgitation signal is inadequate  for assessing PA pressure.   3. The mitral valve is normal in structure. No evidence of mitral valve  regurgitation. No evidence of mitral stenosis.   4. The aortic valve is tricuspid. There is mild calcification of the  aortic valve. Aortic valve regurgitation is not visualized. No aortic  stenosis is present.   5. Aortic dilatation noted. There is mild dilatation of the aortic root,  measuring 38 mm.   6. The inferior vena cava is normal in size with greater than 50%  respiratory variability, suggesting right atrial pressure of 3 mmHg.  Carotid duplex 02/2022: Summary:  Right Carotid: The extracranial vessels were near-normal with only minimal wall thickening or plaque.   Left Carotid: The extracranial vessels were near-normal with only minimal wall thickening or plaque.   Vertebrals:  Bilateral vertebral arteries demonstrate antegrade flow.  Subclavians: Normal flow hemodynamics were seen in bilateral subclavian arteries.  Lexiscan 10/2021:       Findings are consistent with large prior anteroseptal  myocardial infarction. There is no current ischemia.  The study is high risk based on decreased LVEF alone, there is no current myocardium at jeopardy. Consider correlating LVEF with echo.   No ST deviation was noted.   LV perfusion is abnormal.   Left ventricular function is abnormal. Nuclear stress EF: 24 %. End diastolic cavity size is severely enlarged.     Right/left heart cath 08/2016: Mid RCA-2 lesion, 30 %stenosed. Mid RCA-1 lesion, 30 %stenosed. Ost Ramus to Ramus lesion, 20 %stenosed. Ost LAD to Prox LAD lesion, 10 %stenosed. Prox LAD to Mid LAD lesion, 60 %stenosed. A STENT SYNERGY DES 2.25X12 drug eluting stent was successfully placed. 2nd Diag lesion, 99 %stenosed. Post intervention, there is a 0% residual stenosis.   1. Patent stent proximal LAD 2.  Moderate stenosis mid LAD. FFR was 0.84 suggesting the stenosis was not flow limiting.  3. The Diagonal branch is a moderate caliber vessel with a 99% stenosis.  4. Successful PTCA/DES x 1 Diagonal branch 5. Mild disease RCA   Recommendations: Will continue ASA and Plavix for now. He will need to be started on coumadin tomorrow before discharge for his LV thrombus. He can stop ASA when his INR is therapeutic on coumadin since he was not an ACS. I would plan long term coumadin for LV thrombus and Plavix for at least one year given placement of DES. Continue statin and beta blocker.      Physical Exam:   VS:  BP 112/78   Pulse 85   Ht 5\' 10"  (1.778 m)   Wt 244 lb 9.6 oz (110.9 kg)   SpO2 94%   BMI 35.10 kg/m    Wt Readings from Last 3 Encounters:  11/30/22 244 lb 9.6 oz (110.9 kg)  11/18/22 240 lb (108.9 kg)  10/27/22 246 lb 12.8 oz (111.9 kg)    GEN: Obese, 57 year old male in no acute distress NECK: No JVD; No carotid bruits CARDIAC: S1/S2, RRR, no murmurs, rubs, gallops RESPIRATORY:  Clear to auscultation without rales, wheezing or rhonchi ABDOMEN: Soft, non-tender, non-distended EXTREMITIES:  No edema; No deformity   ASSESSMENT AND PLAN: .    Chronic systolic CHF  Stage C, NYHA class II-III symptoms.  EF 02/2022 25 to 30%.  Euvolemic and well compensated on exam. Continue carvedilol, Marcelline Deist, Entresto, and spironolactone.  GDMT limited by BP at this time.  Low sodium diet, fluid restriction <2L, and daily weights encouraged. Educated to contact our office for weight gain of 2 lbs overnight or 5 lbs in one week.  Continue follow-up with heart failure clinic as scheduled.  2.  CAD Stable with no anginal symptoms. No indication for ischemic evaluation.  NST in 2023 was negative for current ischemia.   Continue aspirin, Lipitor, carvedilol, Entresto, and nitroglycerin as needed.  Care and ED precautions discussed.  3. HTN Blood pressure stable. No medication changes at this time.   Encourage adequate hydration. Discussed to monitor BP at home at least 2 hours after medications and sitting for 5-10 minutes. Heart healthy diet and regular cardiovascular exercise encouraged.   4. HLD LDL 58 03/2021.  He is currently at goal.  Continue atorvastatin.  Will request most recent labs from PCP's office. Heart healthy diet and regular cardiovascular exercise encouraged.   5. OSA He is s/p implantation of hypoglossal nerve stimulator with ENT, Dr. Christia Reading on 11/18/2022.  Continue to follow-up with Dr. Jenne Pane, recommended to f/u with Dr. Vassie Loll.  Dispo: Follow-up with me/APP in 4-6 weeks or sooner if anything changes.   Signed, Sharlene Dory, NP

## 2022-11-30 NOTE — Patient Instructions (Addendum)

## 2022-12-08 ENCOUNTER — Other Ambulatory Visit: Payer: Self-pay | Admitting: Cardiology

## 2022-12-20 ENCOUNTER — Other Ambulatory Visit: Payer: Self-pay | Admitting: Cardiology

## 2022-12-23 ENCOUNTER — Other Ambulatory Visit: Payer: Self-pay | Admitting: Urology

## 2023-01-06 ENCOUNTER — Ambulatory Visit (HOSPITAL_BASED_OUTPATIENT_CLINIC_OR_DEPARTMENT_OTHER): Payer: 59 | Admitting: Pulmonary Disease

## 2023-01-06 ENCOUNTER — Encounter (HOSPITAL_BASED_OUTPATIENT_CLINIC_OR_DEPARTMENT_OTHER): Payer: Self-pay | Admitting: Pulmonary Disease

## 2023-01-06 VITALS — BP 124/76 | HR 79 | Resp 16 | Ht 70.0 in | Wt 250.3 lb

## 2023-01-06 DIAGNOSIS — G4733 Obstructive sleep apnea (adult) (pediatric): Secondary | ICD-10-CM

## 2023-01-06 NOTE — Patient Instructions (Signed)
Your device was activated today Start delay 45m Pause time 15 m Duration 8h  Level 1 = 0.6 V Increase by 1 level every week until tongue discomfort  Try to use every night

## 2023-01-06 NOTE — Progress Notes (Signed)
   Subjective:    Patient ID: [name] [name]., male    DOB: Jul 30, 1965, 57 y.o.   MRN: [id number]  HPI  57 yo with ischemic cardiomyopathy for  FU of OSA -Treatment emergent centrals He was unable to tolerate CPAP He went to the dentist and feels he would not be able to use a mouthguard. 07/2021 EF was 25 to 30%        PMH - AW- STEMI 02/2008, received DES to LAD & D2 in 2018   LVEF  25- 35%, ICD recommended -Diabetes type 2 for 20 years, on insulin for 1 year   Implanted 11/18/22 01/06/2023 activation visit  Bedtime 11 PM to midnight, sleep latency 30 minutes, [place] 7 to 8 AM, sometimes sleeps until noon He states he is not ready for ICD yet, wants to wait and see how much he improves with inspire Reviewed ENT follow-up visit  Significant tests/ events reviewed   NPSG 08/2018 - wt 234 lbs -TST 337 minutes, RDI 17/hour, AHI 15.5/hour, no supine sleep, lowest desaturation 83%, 3 REM periods   CPAP titration 01/2021 >> sub opimal ? 10 cm, treatment emergent centrals +  PLMs+   01/2022 HST >> AHI 36/h, total of 1311, 31 centrals, 62 mixed apneas, lowest desaturation  67%  08/2022 HST >> AHI 30/h, low sat 63%, few centrals  Review of Systems neg for any significant sore throat, dysphagia, itching, sneezing, nasal congestion or excess/ purulent secretions, fever, chills, sweats, unintended wt loss, pleuritic or exertional cp, hempoptysis, orthopnea pnd or change in chronic leg swelling. Also denies presyncope, palpitations, heartburn, abdominal pain, nausea, vomiting, diarrhea or change in bowel or urinary habits, dysuria,hematuria, rash, arthralgias, visual complaints, headache, numbness weakness or ataxia.     Objective:   Physical Exam  Gen. Pleasant, well-nourished, in no distress ENT - no thrush, no pallor/icterus,no post nasal drip, incisions healed, tongue midline minimal deviation to right Neck: No JVD, no thyromegaly, no carotid bruits Lungs: no use of accessory  muscles, no dullness to percussion, clear without rales or rhonchi  Cardiovascular: Rhythm regular, heart sounds  normal, no murmurs or gallops, no peripheral edema Musculoskeletal: No deformities, no cyanosis or clubbing `1       Assessment & Plan:

## 2023-01-06 NOTE — Assessment & Plan Note (Addendum)
Hypoglossal nerve stimulator was activated today.  Incision was checked, tongue protrusion was examined Programming : The activation workflow was followed 1.  Stimulation level : Sensation 0.2 V , functional level 0.6 V lower limit, upper limit 1.6 V 2.  Start delay 30 minutes, pause time 15 minutes, duration 8 hours 3.  Sensing waveform was analyzed for 3 minutes 4.  Sleep remote education was provided patient demonstrated competency with the remote and was aware of patient Instruction videos and sleep remote guide 5.  Patient was instructed to step up levels by 1 level (0.1 V ) every week 6.  Check-in visit in 1 month after activation visit to ensure that they are stepping up levels, using therapy " all night, every night" and to evaluate subjective benefit.   7.  Inspire titration sleep study will be scheduled 3 months after the activation visit

## 2023-01-18 ENCOUNTER — Other Ambulatory Visit: Payer: Self-pay | Admitting: Cardiology

## 2023-01-23 ENCOUNTER — Other Ambulatory Visit: Payer: Self-pay | Admitting: Urology

## 2023-02-07 ENCOUNTER — Other Ambulatory Visit: Payer: Self-pay | Admitting: Cardiology

## 2023-02-16 ENCOUNTER — Ambulatory Visit (INDEPENDENT_AMBULATORY_CARE_PROVIDER_SITE_OTHER): Payer: 59 | Admitting: Pulmonary Disease

## 2023-02-16 ENCOUNTER — Encounter (HOSPITAL_BASED_OUTPATIENT_CLINIC_OR_DEPARTMENT_OTHER): Payer: Self-pay | Admitting: Pulmonary Disease

## 2023-02-16 ENCOUNTER — Ambulatory Visit: Payer: 59 | Attending: Nurse Practitioner | Admitting: Nurse Practitioner

## 2023-02-16 ENCOUNTER — Other Ambulatory Visit: Payer: Self-pay | Admitting: Cardiology

## 2023-02-16 ENCOUNTER — Encounter: Payer: Self-pay | Admitting: Nurse Practitioner

## 2023-02-16 VITALS — BP 138/88 | HR 96 | Resp 16 | Ht 70.0 in | Wt 253.8 lb

## 2023-02-16 VITALS — BP 130/80 | HR 82 | Ht 70.0 in | Wt 241.0 lb

## 2023-02-16 DIAGNOSIS — I25119 Atherosclerotic heart disease of native coronary artery with unspecified angina pectoris: Secondary | ICD-10-CM | POA: Diagnosis not present

## 2023-02-16 DIAGNOSIS — G4733 Obstructive sleep apnea (adult) (pediatric): Secondary | ICD-10-CM

## 2023-02-16 DIAGNOSIS — I5022 Chronic systolic (congestive) heart failure: Secondary | ICD-10-CM

## 2023-02-16 DIAGNOSIS — I10 Essential (primary) hypertension: Secondary | ICD-10-CM

## 2023-02-16 DIAGNOSIS — F439 Reaction to severe stress, unspecified: Secondary | ICD-10-CM

## 2023-02-16 DIAGNOSIS — E785 Hyperlipidemia, unspecified: Secondary | ICD-10-CM | POA: Diagnosis not present

## 2023-02-16 DIAGNOSIS — F419 Anxiety disorder, unspecified: Secondary | ICD-10-CM

## 2023-02-16 MED ORDER — NITROGLYCERIN 0.4 MG SL SUBL: 0.4000 mg | SUBLINGUAL_TABLET | SUBLINGUAL | 1 refills | Status: AC | PRN

## 2023-02-16 MED ORDER — BLOOD PRESSURE MONITOR DEVI: 1.0000 | Freq: Every day | 0 refills | Status: AC

## 2023-02-16 NOTE — Patient Instructions (Signed)
We changed your range to 1.0-2.0 V You are at level 4 = 1.3 V  Increase by 1 level per week starting next Wed until we meet again in 1 month

## 2023-02-16 NOTE — Patient Instructions (Addendum)
Medication Instructions:  Your physician recommends that you continue on your current medications as directed. Please refer to the Current Medication list given to you today.  Labwork: None   Testing/Procedures: Your physician has requested that you have an echocardiogram. Echocardiography is a painless test that uses sound waves to create images of your heart. It provides your doctor with information about the size and shape of your heart and how well your heart's chambers and valves are working. This procedure takes approximately one hour. There are no restrictions for this procedure. Please do NOT wear cologne, perfume, aftershave, or lotions (deodorant is allowed). Please arrive 15 minutes prior to your appointment time.  Please note: We ask at that you not bring children with you during ultrasound (echo/ vascular) testing. Due to room size and safety concerns, children are not allowed in the ultrasound rooms during exams. Our front office staff cannot provide observation of children in our lobby area while testing is being conducted. An adult accompanying a patient to their appointment will only be allowed in the ultrasound room at the discretion of the ultrasound technician under special circumstances. We apologize for any inconvenience.  Follow-Up: Your physician recommends that you schedule a follow-up appointment in: 6-8 weeks   Any Other Special Instructions Will Be Listed Below (If Applicable).  If you need a refill on your cardiac medications before your next appointment, please call your pharmacy.

## 2023-02-16 NOTE — Progress Notes (Unsigned)
Subjective:    Patient ID: [name] [name]., male    DOB: April 03, 1965, 58 y.o.   MRN: [id number]  HPI  58 yo with ischemic cardiomyopathy for  FU of OSA -Treatment emergent centrals He was unable to tolerate CPAP He went to the dentist and feels he would not be able to use a mouthguard. 07/2021 EF was 25 to 30%        PMH - AW- STEMI 02/2008, received DES to LAD & D2 in 2018   LVEF  25- 35%, ICD recommended -Diabetes type 2 for 20 years, on insulin for 1 year     Implanted 11/18/22 01/06/2023 activation visit   Bedtime 11 PM to midnight, sleep latency 30 minutes, wake up 7 to 8 AM, sometimes sleeps until noon Sensation 0.2 V , functional level 0.6 V lower limit, upper limit 1.6 V  Start delay 30 minutes, pause time 15 minutes, duration 8 hours  1 month post activation visit today. His incoming amplitude was 1.1 V, he has progressed well to level 6.  He denies tongue discomfort.  He is unable to tell if he is sleeping better.  No bed partner history is available He does not have a smart phone so we had to obtain download by using the programmer and his remote  Significant tests/ events reviewed   NPSG 08/2018 - wt 234 lbs -TST 337 minutes, RDI 17/hour, AHI 15.5/hour, no supine sleep, lowest desaturation 83%, 3 REM periods   CPAP titration 01/2021 >> sub opimal ? 10 cm, treatment emergent centrals +  PLMs+   01/2022 HST >> AHI 36/h, total of 1311, 31 centrals, 62 mixed apneas, lowest desaturation  67%   08/2022 HST >> AHI 30/h, low sat 63%, few centrals  Review of Systems neg for any significant sore throat, dysphagia, itching, sneezing, nasal congestion or excess/ purulent secretions, fever, chills, sweats, unintended wt loss, pleuritic or exertional cp, hempoptysis, orthopnea pnd or change in chronic leg swelling. Also denies presyncope, palpitations, heartburn, abdominal pain, nausea, vomiting, diarrhea or change in bowel or urinary habits, dysuria,hematuria, rash, arthralgias,  visual complaints, headache, numbness weakness or ataxia.     Objective:   Physical Exam  Gen. Pleasant, obese, in no distress ENT - no lesions, no post nasal drip , no tongue deviation on stimulation Neck: No JVD, no thyromegaly, no carotid bruits Lungs: no use of accessory muscles, no dullness to percussion, decreased without rales or rhonchi  Cardiovascular: Rhythm regular, heart sounds  normal, no murmurs or gallops, no peripheral edema Musculoskeletal: No deformities, no cyanosis or clubbing , no tremors       Assessment & Plan:    OSA : Hypoglossal nerve stimulator was reassessed today.  Goal of the follow-up visit was to ensure good attendance, good subjective benefit, good tongue motion and good sense lead waveforms .incision sites appear good, tongue protrusion was examined.  Download was reviewed and usage appears to be up to 8 hours average per night.  Spouse reports that snoring is decreased and he has more energy.  He denies any discomfort.  He is at 1.1 V/level 6 Programming :  1.  Stimulation level : We changed his arranged from 1.0 to 2.0 V and set his amplitude at 1.3/level 4.   we checked stimulation up to 1.6 V and this may be the goal 2.  Start delay 30 minutes, pause time 15 minutes, duration 8 hours 3.  Sensing waveform was analyzed for 3 minutes 4.  Sleep  remote education was provided patient demonstrated competency with the remote and was aware of patient Instruction videos and sleep remote guide 5.  Patient was instructed to step up levels by 1 level (0.1 V ) every week 6.  Check-in visit in 1 month presleep study to ensure that they are stepping up levels, using therapy " all night, every night" and to evaluate subjective benefit.   7.  Inspire titration sleep study will be scheduled 2 to 4 weeks after the prestudy visit to verify efficacy of device

## 2023-02-16 NOTE — Progress Notes (Signed)
Cardiology Office Note:  .   Date: 02/16/2023 ID:  [name] Dutton., DOB 06/05/1965, MRN [id number] PCP: Rebekah Chesterfield, NP  [facility] Providers Cardiologist:  [name] Rich, MD    History of Present Illness: .   [name] Neenan. is a 58 y.o. male with a PMH of chronic systolic CHF, ischemic cardiomyopathy, CAD, hypertension, OSA (followed by Dr. Vassie Loll), hyperlipidemia and history of prior LV thrombus, who presents today for 42-month follow-up. Followed by HF clinic.   Prior CV history of anterior STEMI in 2010, received DES to LAD.  LV EF at that time, 35%.  In 2012, Myoview revealed anterior scar with mild ischemia.  EF in 2013 was 45 to 50%.  GXT at that time was negative for ischemia.  In 2018, echocardiogram revealed EF reduced to 30 to 35%, diffuse hypokinesis with akinesis of anterior and anteroseptal walls, evidence of LV thrombus, was referred for cardiac cath.  See full report below of cardiac catheterization in August 2018, he received drug-eluting stent to D2.  Repeat echo in November 2018 showed EF 35% with apical thrombus.  Repeat echo in February 2019 revealed EF 35 to 40%, restrictive, no clear thrombus.  NST in 2020 showed large anterior/anteroseptal scar, EF 22%. TTE in 2022 EF 25%.  He has been evaluated by EP due to reduced EF and discussed ICD, patient has requested to give this some thought. Most recent Echo 02/2022 revealed EF 25 to 30%, considering barostim.  Last seen by Dr. Dina [name] on April 22, 2022.  Was euvolemic on exam.  Patient noted chronic stable shortness of breath on exertion, NYHA class III symptoms.  Was considering barostim.  Patient stated he had not committed to ICD.  I last saw patient on November 30, 2022.  He told me he underwent implantation of hypoglossal nerve stimulator for OSA on November 18, 2022.  Was tolerating this well.  Swelling was subsiding and followed by Dr. Jenne Pane.  Denied any recurrence of chest pain since I last saw him in  the office.  Was overall doing well.  Today presents for follow-up.  States overall he is doing well and admits to intermittent episodes of chest pain and shortness of breath that is majorly related to stress he says.  Also can occur when walking for a long period of time.  Says this has been chronic and stable for a while.  He admits to significant stress related to caring for his mom, denies any red flag signs/symptoms. Currently lives with his mom and provides care for her. Denies any palpitations, syncope, presyncope, dizziness, orthopnea, PND, swelling or significant weight changes, acute bleeding, or claudication.  ROS: Negative.  See HPI.  Studies Reviewed: Marland Kitchen    EKG: EKG is not ordered today.  EKG was reviewed from October 19, 2022 that revealed normal sinus rhythm, 88 BPM, old MI, no acute ischemic changes.  Echo 02/2022:  1. Left ventricular ejection fraction, by estimation, is 25 to 30%. The  left ventricle has severely decreased function. The left ventricle  demonstrates global hypokinesis with mid to apical septal akinesis and  akinesis of the true apex. The left  ventricular internal cavity size was mildly dilated. Left ventricular  diastolic parameters are consistent with Grade I diastolic dysfunction  (impaired relaxation).   2. Right ventricular systolic function is mildly reduced. The right  ventricular size is normal. Tricuspid regurgitation signal is inadequate  for assessing PA pressure.   3. The mitral valve is  normal in structure. No evidence of mitral valve  regurgitation. No evidence of mitral stenosis.   4. The aortic valve is tricuspid. There is mild calcification of the  aortic valve. Aortic valve regurgitation is not visualized. No aortic  stenosis is present.   5. Aortic dilatation noted. There is mild dilatation of the aortic root,  measuring 38 mm.   6. The inferior vena cava is normal in size with greater than 50%  respiratory variability, suggesting  right atrial pressure of 3 mmHg.  Carotid duplex 02/2022: Summary:  Right Carotid: The extracranial vessels were near-normal with only minimal wall thickening or plaque.   Left Carotid: The extracranial vessels were near-normal with only minimal wall thickening or plaque.   Vertebrals:  Bilateral vertebral arteries demonstrate antegrade flow.  Subclavians: Normal flow hemodynamics were seen in bilateral subclavian arteries.  Lexiscan 10/2021:       Findings are consistent with large prior anteroseptal  myocardial infarction. There is no current ischemia.  The study is high risk based on decreased LVEF alone, there is no current myocardium at jeopardy. Consider correlating LVEF with echo.   No ST deviation was noted.   LV perfusion is abnormal.   Left ventricular function is abnormal. Nuclear stress EF: 24 %. End diastolic cavity size is severely enlarged.     Right/left heart cath 08/2016: Mid RCA-2 lesion, 30 %stenosed. Mid RCA-1 lesion, 30 %stenosed. Ost Ramus to Ramus lesion, 20 %stenosed. Ost LAD to Prox LAD lesion, 10 %stenosed. Prox LAD to Mid LAD lesion, 60 %stenosed. A STENT SYNERGY DES 2.25X12 drug eluting stent was successfully placed. 2nd Diag lesion, 99 %stenosed. Post intervention, there is a 0% residual stenosis.   1. Patent stent proximal LAD 2. Moderate stenosis mid LAD. FFR was 0.84 suggesting the stenosis was not flow limiting.  3. The Diagonal branch is a moderate caliber vessel with a 99% stenosis.  4. Successful PTCA/DES x 1 Diagonal branch 5. Mild disease RCA   Recommendations: Will continue ASA and Plavix for now. He will need to be started on coumadin tomorrow before discharge for his LV thrombus. He can stop ASA when his INR is therapeutic on coumadin since he was not an ACS. I would plan long term coumadin for LV thrombus and Plavix for at least one year given placement of DES. Continue statin and beta blocker.      Physical Exam:   VS:  BP 130/80    Pulse 82   Ht 5\' 10"  (1.778 m)   Wt 241 lb (109.3 kg)   SpO2 98%   BMI 34.58 kg/m    Wt Readings from Last 3 Encounters:  02/16/23 241 lb (109.3 kg)  01/06/23 250 lb 4.8 oz (113.5 kg)  11/30/22 244 lb 9.6 oz (110.9 kg)    GEN: Obese, 58 year old male in no acute distress NECK: No JVD; No carotid bruits CARDIAC: S1/S2, RRR, no murmurs, rubs, gallops RESPIRATORY:  Clear to auscultation without rales, wheezing or rhonchi ABDOMEN: Soft, non-tender, non-distended EXTREMITIES:  No edema; No deformity   ASSESSMENT AND PLAN: .    Chronic systolic CHF  Stage C, NYHA class II-III symptoms.  EF 02/2022 25 to 30%.  Euvolemic and well compensated on exam. Continue carvedilol, Marcelline Deist, Entresto, and spironolactone. Low sodium diet, fluid restriction <2L, and daily weights encouraged. Educated to contact our office for weight gain of 2 lbs overnight or 5 lbs in one week.  Continue follow-up with heart failure clinic as scheduled.  Will update echocardiogram at  this time.  2.  CAD Does admit to episodes of chest pain that seem to be related to episodes of stress/anxiety-see below.  No indication for ischemic evaluation.  NST in 2023 was negative for current ischemia.   Continue aspirin, Lipitor, carvedilol, Entresto, and nitroglycerin as needed.  Care and ED precautions discussed.  Will provide refill for nitroglycerin.  3. HTN Blood pressure borderline elevated.  Discussed SBP goal is less than 130.  Will provide Rx for Omron cuff for him to check BP at home.  No medication changes at this time.  Encourage adequate hydration. Discussed to monitor BP at home at least 2 hours after medications and sitting for 5-10 minutes. Heart healthy diet and regular cardiovascular exercise encouraged.  He will bring this BP log at next office visit for me to review.  4. HLD LDL 62 last year.  He is currently at goal.  Continue atorvastatin.  Heart healthy diet and regular cardiovascular exercise encouraged.   5.  OSA He is s/p implantation of hypoglossal nerve stimulator with ENT, Dr. Christia Reading on 11/18/2022.  Continue to follow-up with Dr. Jenne Pane, recommended to f/u with Dr. Vassie Loll.  6.  Stress/anxiety Related to caregiving he provides his mom full-time, lives with his mother.  Denies any red flag signs or symptoms.  Discussed options for management and will place referral for behavioral health. Pt is in agreement with this.   Dispo: Care and ED precautions discussed.  Follow-up with me/APP in 6-8 weeks or sooner if anything changes.   Signed, Sharlene Dory, NP

## 2023-02-17 ENCOUNTER — Ambulatory Visit: Payer: 59 | Attending: Nurse Practitioner

## 2023-02-17 DIAGNOSIS — I5022 Chronic systolic (congestive) heart failure: Secondary | ICD-10-CM

## 2023-02-17 LAB — ECHOCARDIOGRAM COMPLETE
AR max vel: 2.44 cm2
AV Area VTI: 2.32 cm2
AV Area mean vel: 2.14 cm2
AV Mean grad: 4 mm[Hg]
AV Peak grad: 6.2 mm[Hg]
Ao pk vel: 1.24 m/s
Area-P 1/2: 4.41 cm2
Calc EF: 28.3 %
MV VTI: 2.04 cm2
S' Lateral: 6.3 cm
Single Plane A2C EF: 33.1 %
Single Plane A4C EF: 25.6 %

## 2023-02-17 MED ORDER — PERFLUTREN LIPID MICROSPHERE
1.0000 mL | INTRAVENOUS | Status: AC | PRN
  Administered 2023-02-17: 10 mL via INTRAVENOUS

## 2023-02-24 ENCOUNTER — Telehealth: Payer: Self-pay | Admitting: Nurse Practitioner

## 2023-02-24 NOTE — Telephone Encounter (Signed)
 Pt call requesting cb with Echo results

## 2023-02-24 NOTE — Telephone Encounter (Signed)
Patient informed of Echo Results and that a copy will be sent to his primary care physician as well. Verbalized understanding

## 2023-02-28 ENCOUNTER — Other Ambulatory Visit: Payer: Self-pay | Admitting: Urology

## 2023-03-04 ENCOUNTER — Telehealth (HOSPITAL_COMMUNITY): Payer: Self-pay | Admitting: Cardiology

## 2023-03-04 NOTE — Telephone Encounter (Signed)
Front office called patient at [phone] and at [phone] to try and remind patient of his appointment with Dr. Elwyn Lade on tomorrow 03/05/23 at 9:00 AM.   Both telephone numbers rang and rang and no one answered the telephone call from front office.   Unable to reach patient / unable to speak to patient for appointment reminder.

## 2023-03-04 NOTE — Progress Notes (Signed)
ADVANCED HEART FAILURE FOLLOW UP CLINIC NOTE  Referring Physician: Rebekah Chesterfield, NP  Primary Care: Rebekah Chesterfield, NP Primary Cardiologist:  HPI: [name] [name]. is a 58 y.o. 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      Prior CV history of anterior STEMI in 2010, received DES to LAD.  LV EF at that time, 35%.   Cardiac catheterization in August 2018, he received drug-eluting stent to D2.  Repeat echo in November 2018 showed EF 35% with apical thrombus.  Repeat echo in February 2019 revealed EF 35 to 40%, restrictive, no clear thrombus.  TTE in 2022 EF 25%.  He has been evaluated by EP due to reduced EF and discussed ICD, patient has requested to give this some thought. Most recent Echo 02/2022 revealed EF 25 to 30%, considering barostim but now planning on moving forward with [facility] device.   Underwent hypoglossal nerve stimulator placement 10/2022.      SUBJECTIVE:  Patient had the inspire implant placed with minimal improvement in his sleep habits, though he reports that the device is still on a lower setting. He otherwise still complains of significant limiting fatigue, usually with only minimal exertion.  He denies any recent weight gain, lower extremity swelling.  He has been trying to lose weight but has had had difficulty with it coming off.  Taking all his medications as prescribed, though did not take his doses this morning.  PMH, current medications, allergies, social history, and family history reviewed in epic.  PHYSICAL EXAM: Vitals:   03/05/23 0852  BP: (!) 150/85  Pulse: 82  SpO2: 96%   GENERAL: Well nourished and in no apparent distress at rest.  PULM:  Normal work of breathing, clear to auscultation bilaterally. Respirations are unlabored.  CARDIAC:  JVP: Flat         Normal rate with regular rhythm. No murmurs, rubs  or gallops.  No lower extremity edema. Warm and well perfused extremities. ABDOMEN: Soft, non-tender, non-distended. NEUROLOGIC: Patient is oriented x3 with no focal or lateralizing neurologic deficits.    DATA REVIEW  ECG: Anteroseptal infarct pattern, normal sinus rhythm, normal QRS   ECHO: 02/2022: EF 25-30%, mid to apical septal and anterior akinesis, mildly dilated LV, mildly reduced RV systolic function.   CATH: 08/2016: Patent LAD stent, moderate mid LAD stenosis FFR negative, D2 stenosis.   Lexiscan 2023: Large prior anteroseptal myocardial infarction. No evidence of reversible ishcemia.   Heart failure review: - Classification: Heart failure with reduced EF - Etiology: Ischemic - NYHA Class: IIIb  - Volume status: Euvolemic - ACEi/ARB/ARNI: Maximally tolerated dose - Aldosterone antagonist: Maximally tolerated dose - Beta-blocker: Maximally tolerated dose - Digoxin: Plan to start at a subsequent visit - Hydralazine/Nitrates: Not indicated - SGLT2i: Maximally tolerated dose - GLP-1: Already on GLP-1 - Advanced therapies: Not needed at this time - ICD: Already in place  ASSESSMENT & PLAN:  Chronic systolic HF: Patient with stable Stage C, NYHA Class III symptoms. He has been on excellent medical therapy. His main symptom is fatigue. Doubt that EF will meaningfully improve given known infarct. Declined bartostim in the past. Will start verociguat given maximal titration of medical therapy and plan for CPET to risk stratify.  -Continue spironolactone 25 mg daily -Start vericiguat 5mg  daliy -Continue carvedilol 25 mg twice a day, Entresto 97/103 mg twice  daily, dapagliflozin 10 mg daily -Low threshold for invasive assessment - CPET when able   CAD: Known LAD disease, atypical chest pain that appears to be associated with stress.  SPECT without evidence of ischemia. -Continue beta-blocker, aspirin, statin   HTN: Well-controlled on current therapy as above   OSA: Unable  to tolerate CPAP, s/p hypoglossal nerve stimulator placement.    Follow up in 3 months  Clearnce Hasten, MD Advanced Heart Failure Mechanical Circulatory Support 03/05/23

## 2023-03-05 ENCOUNTER — Telehealth (HOSPITAL_COMMUNITY): Payer: Self-pay

## 2023-03-05 ENCOUNTER — Ambulatory Visit (HOSPITAL_COMMUNITY)
Admission: RE | Admit: 2023-03-05 | Discharge: 2023-03-05 | Disposition: A | Discharge status: Home / Self Care | Payer: 59 | Source: Ambulatory Visit | Attending: Cardiology | Admitting: Cardiology

## 2023-03-05 ENCOUNTER — Other Ambulatory Visit (HOSPITAL_COMMUNITY): Payer: Self-pay

## 2023-03-05 VITALS — BP 150/85 | HR 82 | Wt 254.0 lb

## 2023-03-05 DIAGNOSIS — I252 Old myocardial infarction: Secondary | ICD-10-CM | POA: Diagnosis not present

## 2023-03-05 DIAGNOSIS — I110 Hypertensive heart disease with heart failure: Secondary | ICD-10-CM | POA: Insufficient documentation

## 2023-03-05 DIAGNOSIS — I420 Dilated cardiomyopathy: Secondary | ICD-10-CM | POA: Diagnosis not present

## 2023-03-05 DIAGNOSIS — Z955 Presence of coronary angioplasty implant and graft: Secondary | ICD-10-CM | POA: Insufficient documentation

## 2023-03-05 DIAGNOSIS — G4733 Obstructive sleep apnea (adult) (pediatric): Secondary | ICD-10-CM | POA: Insufficient documentation

## 2023-03-05 DIAGNOSIS — I5022 Chronic systolic (congestive) heart failure: Secondary | ICD-10-CM | POA: Diagnosis not present

## 2023-03-05 DIAGNOSIS — I2510 Atherosclerotic heart disease of native coronary artery without angina pectoris: Secondary | ICD-10-CM | POA: Diagnosis not present

## 2023-03-05 DIAGNOSIS — R0789 Other chest pain: Secondary | ICD-10-CM | POA: Diagnosis not present

## 2023-03-05 DIAGNOSIS — Z79899 Other long term (current) drug therapy: Secondary | ICD-10-CM | POA: Diagnosis not present

## 2023-03-05 MED ORDER — VERQUVO 5 MG PO TABS: 5.0000 mg | ORAL_TABLET | Freq: Every day | ORAL | 3 refills | Status: DC

## 2023-03-05 NOTE — Patient Instructions (Signed)
Medication Changes:  START: VERICIGUAT 5MG  ONCE DAILY   Testing/Procedures:  WE WILL CONTACT YOU REGARDING CARDIOPULMONARY STRESS TESTING   Follow-Up in: 3 MONTHS PLEASE CALL OUR OFFICE AROUND MARCH  TO GET SCHEDULED FOR YOUR APPOINTMENT. PHONE NUMBER IS [phone] OPTION 2    At the [facility], you and your health needs are our priority. We have a designated team specialized in the treatment of Heart Failure. This Care Team includes your primary Heart Failure Specialized Cardiologist (physician), Advanced Practice Providers (APPs- Physician Assistants and Nurse Practitioners), and Pharmacist who all work together to provide you with the care you need, when you need it.   You may see any of the following providers on your designated Care Team at your next follow up:  Dr. Arvilla Meres Dr. Marca Ancona Dr. Dorthula Nettles Dr. Theresia Bough Tonye Becket, NP Robbie Lis, [place] [facility] [place], [place] Brynda Peon, NP [place] Lee, NP Karle Plumber, PharmD   Please be sure to bring in all your medications bottles to every appointment.   Need to Contact us:  If you have any questions or concerns before your next appointment please send [place] a message through [place] or call our office at [phone].    TO LEAVE A MESSAGE FOR THE NURSE SELECT OPTION 2, PLEASE LEAVE A MESSAGE INCLUDING: YOUR NAME DATE OF BIRTH CALL BACK NUMBER REASON FOR CALL**this is important as we prioritize the call backs  YOU WILL RECEIVE A CALL BACK THE SAME DAY AS LONG AS YOU CALL BEFORE 4:00 PM

## 2023-03-05 NOTE — Telephone Encounter (Signed)
Advanced Heart Failure Patient Advocate Encounter  Prior authorization for Aileen Pilot has been submitted and approved. Test billing returns $0 for 90 day supply.  Key: [id number] Effective: 03/05/2023 to 01/19/2024  Burnell Blanks, CPhT Rx Patient Advocate Phone: [phone]

## 2023-03-21 ENCOUNTER — Other Ambulatory Visit: Payer: Self-pay | Admitting: Urology

## 2023-03-25 ENCOUNTER — Encounter (HOSPITAL_BASED_OUTPATIENT_CLINIC_OR_DEPARTMENT_OTHER): Payer: Self-pay | Admitting: Pulmonary Disease

## 2023-03-25 ENCOUNTER — Ambulatory Visit (HOSPITAL_BASED_OUTPATIENT_CLINIC_OR_DEPARTMENT_OTHER): Payer: 59 | Admitting: Pulmonary Disease

## 2023-03-25 ENCOUNTER — Encounter: Payer: Self-pay | Admitting: Pulmonary Disease

## 2023-03-25 VITALS — BP 110/80 | HR 83 | Ht 70.0 in | Wt 247.2 lb

## 2023-03-25 DIAGNOSIS — G4733 Obstructive sleep apnea (adult) (pediatric): Secondary | ICD-10-CM

## 2023-03-25 NOTE — Progress Notes (Signed)
 Subjective:    Patient ID: [name] [name]., male    DOB: 03-11-1965, 58 y.o.   MRN: [id number]  HPI  58 yo with ischemic cardiomyopathy for  FU of OSA -Treatment emergent centrals He was unable to tolerate CPAP He went to the dentist and feels he would not be able to use a mouthguard. 07/2021 EF was 25 to 30%      PMH - AW- STEMI 02/2008, received DES to LAD & D2 in 2018   LVEF  25- 35%, ICD recommended -Diabetes type 2 for 20 years, on insulin for 1 year     Implanted 11/18/22 01/06/2023 activation visit   Bedtime 11 PM to midnight, sleep latency 30 minutes, wake up 7 to 8 AM, sometimes sleeps until noon Sensation 0.2 V , functional level 0.6 V lower limit, upper limit 1.6 V   Start delay 30 minutes, pause time 15 minutes, duration 8 hours   1 month post activation changed range to 1.0-2.0 V Set to  level 4 = 1.3 V  Readiness check visit He continues to feel tired.  He is tolerating current voltage very well.  He has been able to increase amplitude to current level of 1.8 V. He was arrested by the cops when he was parked due to being tired -they saw disconjugate eye movements and charged him with DUI.  He has a court date coming up  Overall he is tolerating device very well but continues to be tired and is not sure whether this is coming from his heart or his sleep    Significant tests/ events reviewed   NPSG 08/2018 - wt 234 lbs -TST 337 minutes, RDI 17/hour, AHI 15.5/hour, no supine sleep, lowest desaturation 83%, 3 REM periods   CPAP titration 01/2021 >> sub opimal ? 10 cm, treatment emergent centrals +  PLMs+   01/2022 HST >> AHI 36/h, total of 1311, 31 centrals, 62 mixed apneas, lowest desaturation  67%   08/2022 HST >> AHI 30/h, low sat 63%, few centrals    Review of Systems neg for any significant sore throat, dysphagia, itching, sneezing, nasal congestion or excess/ purulent secretions, fever, chills, sweats, unintended wt loss, pleuritic or exertional cp,  hempoptysis, orthopnea pnd or change in chronic leg swelling. Also denies presyncope, palpitations, heartburn, abdominal pain, nausea, vomiting, diarrhea or change in bowel or urinary habits, dysuria,hematuria, rash, arthralgias, visual complaints, headache, numbness weakness or ataxia.     Objective:   Physical Exam  Gen. Pleasant, obese, in no distress ENT - no lesions, no post nasal drip Neck: No JVD, no thyromegaly, no carotid bruits Lungs: no use of accessory muscles, no dullness to percussion, decreased without rales or rhonchi  Cardiovascular: Rhythm regular, heart sounds  normal, no murmurs or gallops, no peripheral edema Musculoskeletal: No deformities, no cyanosis or clubbing , no tremors       Assessment & Plan:    OSA status postplacement of hypoglossal neurostimulator device  Hypoglossal nerve stimulator was reassessed today.  Goal of the follow-up visit was to ensure good compliance, good subjective benefit, good tongue motion and good sense lead waveforms .incision sites appear good, tongue protrusion was examined.  Download was reviewed and usage appears to be up to 8 hours average per night.  No bed partner history is available he denies any discomfort.   Programming :   Sleep remote education was provided patient demonstrated competency with the remote and was aware of patient Instruction videos and sleep remote guide  Download was reviewed which confirms good compliance with the device  His range will be maintained between 1.0 to 2.0 V, beyond that he seems to have hyperstimulation. I would like him to be studied between 1.5 and 2.0 V to find therapeutic amplitude during his sleep study which will be scheduled today

## 2023-03-25 NOTE — Patient Instructions (Signed)
 Increase 1 level every week if comfortable  Sleep study

## 2023-03-26 ENCOUNTER — Encounter: Payer: Self-pay | Admitting: Urology

## 2023-03-26 ENCOUNTER — Ambulatory Visit: Payer: 59 | Admitting: Urology

## 2023-03-26 VITALS — BP 142/91 | HR 97

## 2023-03-26 DIAGNOSIS — N401 Enlarged prostate with lower urinary tract symptoms: Secondary | ICD-10-CM | POA: Diagnosis not present

## 2023-03-26 DIAGNOSIS — Z87898 Personal history of other specified conditions: Secondary | ICD-10-CM

## 2023-03-26 DIAGNOSIS — N529 Male erectile dysfunction, unspecified: Secondary | ICD-10-CM

## 2023-03-26 DIAGNOSIS — N138 Other obstructive and reflux uropathy: Secondary | ICD-10-CM

## 2023-03-26 DIAGNOSIS — N3281 Overactive bladder: Secondary | ICD-10-CM

## 2023-03-26 DIAGNOSIS — R339 Retention of urine, unspecified: Secondary | ICD-10-CM

## 2023-03-26 LAB — URINALYSIS, ROUTINE W REFLEX MICROSCOPIC
Bilirubin, UA: NEGATIVE
Ketones, UA: NEGATIVE
Leukocytes,UA: NEGATIVE
Nitrite, UA: NEGATIVE
Protein,UA: NEGATIVE
RBC, UA: NEGATIVE
Specific Gravity, UA: 1.02 (ref 1.005–1.030)
Urobilinogen, Ur: 0.2 mg/dL (ref 0.2–1.0)
pH, UA: 6 (ref 5.0–7.5)

## 2023-03-26 LAB — MICROSCOPIC EXAMINATION
Bacteria, UA: NONE SEEN
RBC, Urine: NONE SEEN /HPF (ref 0–2)

## 2023-03-26 MED ORDER — GEMTESA 75 MG PO TABS: 1.0000 | ORAL_TABLET | Freq: Every day | ORAL | 11 refills | Status: AC

## 2023-03-26 MED ORDER — SOLIFENACIN SUCCINATE 5 MG PO TABS: 5.0000 mg | ORAL_TABLET | Freq: Every day | ORAL | 11 refills | Status: DC

## 2023-03-26 NOTE — Progress Notes (Signed)
 03/26/2023 8:51 AM   [name] [name]. 05-06-1965 [id number]  Referring provider: Rebekah Chesterfield, NP [address] [place] [address] [place],  [place] [address]  No chief complaint on file.   HPI: [name] [name] is a 57yo here for followup for OAB, BPH and erectile dysfunction. IPSS 2 QOL 1 on vesicare and gemtesa. Urine stream strong. No straining to urinate. He has occasional post void dribbling. No dysuria or hematuria. He is on farxiga. He is going to a stress test. He does not do the trimix injections   PMH: Past Medical History:  Diagnosis Date   Anxiety    Arthritis    in lower back   Chronic heart failure (HCC)    Complication of anesthesia    with mask and gas   Coronary artery disease    a. 02/2008: anterior STEMI s/p DES to prox LAD, b. Myoview 2012: scar in the anterior wall with mild ischemia and EF 33%, c. neg GXT 06/2011  d. 08/2016: cath with DES to diagonal    Depression with anxiety    Diabetes mellitus    TYPE II   Dyspnea    with exertion   ED (erectile dysfunction)    Fatty liver    H/O ELEVATED LIVER ENZYMES   Headache    High cholesterol    Hyperlipidemia    Hypertension    Ischemic cardiomyopathy 08/14/2016   a. Previous EF 33-35% 2012, b. 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 [facility])    Myocardial infarction (HCC)    Noncompliance    Prior hx of med noncompliance   PONV (postoperative nausea and vomiting)    Short-term memory loss    per pt.   Sleep apnea    unable to tolerate CPAP    Surgical History: Past Surgical History:  Procedure Laterality Date   CORONARY ANGIOPLASTY     CORONARY PRESSURE/FFR STUDY N/A 08/21/2016   Procedure: INTRAVASCULAR PRESSURE WIRE/FFR STUDY;  Surgeon: Kathleene Hazel, MD;  Location: [facility];  Service: Cardiovascular;  Laterality: N/A;   CORONARY STENT INTERVENTION N/A 08/21/2016   Procedure: CORONARY STENT INTERVENTION;  Surgeon: Kathleene Hazel, MD;   Location: [facility];  Service: Cardiovascular;  Laterality: N/A;   CYSTOSCOPY N/A 12/04/2019   Procedure: CYSTOSCOPY;  Surgeon: Malen Gauze, MD;  Location: [facility];  Service: Urology;  Laterality: N/A;   DRUG INDUCED ENDOSCOPY Bilateral 06/24/2022   Procedure: DRUG INDUCED SLEEP ENDOSCOPY;  Surgeon: Christia Reading, MD;  Location: [facility] OR;  Service: ENT;  Laterality: Bilateral;   IMPLANTATION OF HYPOGLOSSAL NERVE STIMULATOR Right 11/18/2022   Procedure: IMPLANTATION OF HYPOGLOSSAL NERVE STIMULATOR;  Surgeon: Christia Reading, MD;  Location: [facility] OR;  Service: ENT;  Laterality: Right;   LIVER BIOPSY     MI WITH STENTS     NASAL SEPTOPLASTY W/ TURBINOPLASTY N/A 01/25/2019   Procedure: BILATERAL TURBINATE REDUCTION;  Surgeon: Newman Pies, MD;  Location: [facility] OR;  Service: ENT;  Laterality: N/A;   NASAL SINUS SURGERY     RIGHT/LEFT HEART CATH AND CORONARY ANGIOGRAPHY N/A 08/21/2016   Procedure: Right/Left Heart Cath and Coronary Angiography;  Surgeon: Kathleene Hazel, MD;  Location: [facility];  Service: Cardiovascular;  Laterality: N/A;   TRANSURETHRAL RESECTION OF PROSTATE N/A 12/04/2019   Procedure: TRANSURETHRAL RESECTION OF THE PROSTATE (TURP);  Surgeon: Malen Gauze, MD;  Location: [facility];  Service: Urology;  Laterality: N/A;  Home Medications:  Allergies as of 03/26/2023   No Known Allergies      Medication List        Accurate as of March 26, 2023  8:51 AM. If you have any questions, ask your nurse or doctor.          acetaminophen 650 MG CR tablet Commonly known as: TYLENOL Take 1,300-1,950 mg by mouth daily as needed for pain.   AMBULATORY NON FORMULARY MEDICATION 0.2 mLs by Intracavernosal route as needed. Medication Name: Trimix  PGE Pap 30mg  Phent 1mg    aspirin EC 81 MG tablet Take 81 mg by mouth daily.   atorvastatin 80 MG tablet Commonly known as: LIPITOR Take 1 tablet by mouth once daily   BD Pen Needle Nano 2nd Gen 32G X 4  MM Misc Generic drug: Insulin Pen Needle USE AS DIRECTED ONCE DAILY WITH SOLIQUA   Blood Pressure Monitor Devi 1 each by Does not apply route daily.   carvedilol 25 MG tablet Commonly known as: COREG Take 25 mg by mouth 2 (two) times daily.   dapagliflozin propanediol 10 MG Tabs tablet Commonly known as: FARXIGA Take 10 mg by mouth daily.   desloratadine 5 MG tablet Commonly known as: CLARINEX Take 1 tablet (5 mg total) by mouth daily. What changed:  when to take this reasons to take this   Entresto 97-103 MG Generic drug: sacubitril-valsartan Take 1 tablet by mouth twice daily   escitalopram 20 MG tablet Commonly known as: LEXAPRO Take 20 mg by mouth daily.   fluticasone 50 MCG/ACT nasal spray Commonly known as: FLONASE Place 2 sprays into both nostrils daily.   Gemtesa 75 MG Tabs Generic drug: Vibegron Take 1 tablet (75 mg total) by mouth daily.   glipiZIDE 10 MG 24 hr tablet Commonly known as: GLUCOTROL XL Take 10 mg by mouth daily.   nitroGLYCERIN 0.4 MG SL tablet Commonly known as: NITROSTAT Place 1 tablet (0.4 mg total) under the tongue every 5 (five) minutes as needed for chest pain.   Ozempic (1 MG/DOSE) 2 MG/1.5ML Sopn Generic drug: Semaglutide (1 MG/DOSE) Inject 1 mg into the skin every Saturday.   solifenacin 5 MG tablet Commonly known as: VESICARE Take 1 tablet by mouth once daily   spironolactone 25 MG tablet Commonly known as: ALDACTONE Take 0.5 tablets (12.5 mg total) by mouth daily.   tamsulosin 0.4 MG Caps capsule Commonly known as: FLOMAX TAKE 1 CAPSULE BY MOUTH ONCE DAILY AFTER SUPPER   Toujeo Max SoloStar 300 UNIT/ML Solostar Pen Generic drug: insulin glargine (2 Unit Dial) Inject 72 Units into the skin daily.   Verquvo 5 MG Tabs Generic drug: Vericiguat Take 1 tablet (5 mg total) by mouth daily.        Allergies: No Known Allergies  Family History: Family History  Problem Relation Age of Onset   Coronary artery  disease Other        UNKNOWN   Heart disease Father        CAD s/p CABG   Diabetes Mellitus II Father    Diabetes Mellitus II Mother    Cancer Brother     Social History:  reports that he has never smoked. He has never been exposed to tobacco smoke. He has never used smokeless tobacco. He reports that he does not drink alcohol and does not use drugs.  ROS: All other review of systems were reviewed and are negative except what is noted above in HPI  Physical Exam: BP Marland Kitchen)  142/91   Pulse 97   Constitutional:  Alert and oriented, No acute distress. HEENT: [place] AT, moist mucus membranes.  Trachea midline, no masses. Cardiovascular: No clubbing, cyanosis, or edema. Respiratory: Normal respiratory effort, no increased work of breathing. GI: Abdomen is soft, nontender, nondistended, no abdominal masses GU: No CVA tenderness.  Lymph: No cervical or inguinal lymphadenopathy. Skin: No rashes, bruises or suspicious lesions. Neurologic: Grossly intact, no focal deficits, moving all 4 extremities. Psychiatric: Normal mood and affect.  Laboratory Data: Lab Results  Component Value Date   WBC 7.5 11/18/2022   HGB 15.9 11/18/2022   HCT 47.0 11/18/2022   MCV 87.9 11/18/2022   PLT 358 11/18/2022    Lab Results  Component Value Date   CREATININE 0.86 11/18/2022    Lab Results  Component Value Date   PSA 0.5 07/12/2019    No results found for: "TESTOSTERONE"  Lab Results  Component Value Date   HGBA1C 7.4 (H) 12/04/2019    Urinalysis    Component Value Date/Time   COLORURINE AMBER (A) 05/19/2019 1600   APPEARANCEUR Clear 09/24/2022 1317   LABSPEC 1.031 (H) 05/19/2019 1600   PHURINE 6.0 05/19/2019 1600   GLUCOSEU 3+ (A) 09/24/2022 1317   HGBUR LARGE (A) 05/19/2019 1600   BILIRUBINUR Negative 09/24/2022 1317   KETONESUR 20 (A) 05/19/2019 1600   PROTEINUR Negative 09/24/2022 1317   PROTEINUR >=300 (A) 05/19/2019 1600   UROBILINOGEN 0.2 09/29/2019 0910   NITRITE Negative  09/24/2022 1317   NITRITE NEGATIVE 05/19/2019 1600   LEUKOCYTESUR Negative 09/24/2022 1317   LEUKOCYTESUR NEGATIVE 05/19/2019 1600    Lab Results  Component Value Date   LABMICR Comment 09/24/2022   WBCUA 0-5 12/04/2020   LABEPIT 0-10 12/04/2020   MUCUS Present (A) 12/04/2020   BACTERIA Few (A) 12/04/2020    Pertinent Imaging:  No results found for this or any previous visit.  No results found for this or any previous visit.  No results found for this or any previous visit.  No results found for this or any previous visit.  No results found for this or any previous visit.  No results found for this or any previous visit.  No results found for this or any previous visit.  No results found for this or any previous visit.   Assessment & Plan:    1. Benign prostatic hyperplasia with urinary obstruction (Primary) -imprved after TURP - Urinalysis, Routine w reflex microscopic  2. Incomplete bladder emptying -resolved  3. OAB Continue vesicare and gemtesa   No follow-ups on file.  Wilkie Aye, MD  [facility] [place]

## 2023-03-26 NOTE — Patient Instructions (Signed)

## 2023-04-06 ENCOUNTER — Encounter: Payer: Self-pay | Admitting: Nurse Practitioner

## 2023-04-06 ENCOUNTER — Ambulatory Visit: Payer: 59 | Attending: Nurse Practitioner | Admitting: Nurse Practitioner

## 2023-04-06 VITALS — BP 116/70 | HR 80 | Ht 70.0 in | Wt 250.8 lb

## 2023-04-06 DIAGNOSIS — I10 Essential (primary) hypertension: Secondary | ICD-10-CM

## 2023-04-06 DIAGNOSIS — G4733 Obstructive sleep apnea (adult) (pediatric): Secondary | ICD-10-CM

## 2023-04-06 DIAGNOSIS — R0609 Other forms of dyspnea: Secondary | ICD-10-CM | POA: Diagnosis not present

## 2023-04-06 DIAGNOSIS — I5022 Chronic systolic (congestive) heart failure: Secondary | ICD-10-CM

## 2023-04-06 DIAGNOSIS — R5383 Other fatigue: Secondary | ICD-10-CM

## 2023-04-06 DIAGNOSIS — I2510 Atherosclerotic heart disease of native coronary artery without angina pectoris: Secondary | ICD-10-CM

## 2023-04-06 DIAGNOSIS — E785 Hyperlipidemia, unspecified: Secondary | ICD-10-CM

## 2023-04-06 NOTE — Patient Instructions (Addendum)
Medication Instructions:  Your physician recommends that you continue on your current medications as directed. Please refer to the Current Medication list given to you today.   Labwork: None  Testing/Procedures: None  Follow-Up: Your physician recommends that you schedule a follow-up appointment in: 4 months    Any Other Special Instructions Will Be Listed Below (If Applicable).     If you need a refill on your cardiac medications before your next appointment, please call your pharmacy.   

## 2023-04-06 NOTE — Progress Notes (Unsigned)
 Cardiology Office Note:  .   Date: 04/06/2023 ID:  [name] [name]., DOB 11-Apr-1965, MRN [id number] PCP: Rebekah Chesterfield, NP  [facility] Providers Cardiologist:  [name], MD    History of Present Illness: .   [name] [name]. is a 58 y.o. male with a PMH of chronic systolic CHF, ischemic cardiomyopathy, CAD, hypertension, OSA (followed by Dr. Vassie Loll), hyperlipidemia and history of prior LV thrombus, who presents today for 74-month follow-up. Followed by HF clinic.   Prior CV history of anterior STEMI in 2010, received DES to LAD.  LV EF at that time, 35%.  In 2012, Myoview revealed anterior scar with mild ischemia.  EF in 2013 was 45 to 50%.  GXT at that time was negative for ischemia.  In 2018, echocardiogram revealed EF reduced to 30 to 35%, diffuse hypokinesis with akinesis of anterior and anteroseptal walls, evidence of LV thrombus, was referred for cardiac cath.  See full report below of cardiac catheterization in August 2018, he received drug-eluting stent to D2.  Repeat echo in November 2018 showed EF 35% with apical thrombus.  Repeat echo in February 2019 revealed EF 35 to 40%, restrictive, no clear thrombus.  NST in 2020 showed large anterior/anteroseptal scar, EF 22%. TTE in 2022 EF 25%.  He has been evaluated by EP due to reduced EF and discussed ICD, patient has requested to give this some thought. Most recent Echo 02/2022 revealed EF 25 to 30%, considering barostim.  Last seen by Dr. [name] on April 22, 2022.  Was euvolemic on exam.  Patient noted chronic stable shortness of breath on exertion, NYHA class III symptoms.  Was considering barostim.  Patient stated he had not committed to ICD.  I last saw patient on November 30, 2022.  He told me he underwent implantation of hypoglossal nerve stimulator for OSA on November 18, 2022.  Was tolerating this well.  Swelling was subsiding and followed by Dr. Jenne Pane.  Denied any recurrence of chest pain since I last saw him in  the office.  Was overall doing well.  02/16/2023 - Today presents for follow-up.  States overall he is doing well and admits to intermittent episodes of chest pain and shortness of breath that is majorly related to stress he says.  Also can occur when walking for a long period of time.  Says this has been chronic and stable for a while.  He admits to significant stress related to caring for his mom, denies any red flag signs/symptoms. Currently lives with his mom and provides care for her. Denies any palpitations, syncope, presyncope, dizziness, orthopnea, PND, swelling or significant weight changes, acute bleeding, or claudication.  04/06/2023 -presents today for follow-up.  Admits to some fatigue, not sure if this is related to his sleep apnea or his heart failure.  He admits to dyspnea on exertion, believes he is dehydrated.  He says he is starting to work on increasing his fluid intake.  He is wanting to know about a stress test that was previously recommended from last office visit with heart failure clinic. Denies any chest pain, palpitations, syncope, presyncope, dizziness, orthopnea, PND, swelling or significant weight changes, acute bleeding, or claudication.  ROS: Negative.  See HPI.  Studies Reviewed: Marland Kitchen    EKG: EKG is not ordered today.    Echo 01/2023:  1. Left ventricular ejection fraction, by estimation, is 25 to 30%. The  left ventricle has severely decreased function. The left ventricle  demonstrates regional  wall motion abnormalities (see scoring  diagram/findings for description). The left  ventricular internal cavity size was moderately to severely dilated. Left  ventricular diastolic parameters are consistent with Grade II diastolic  dysfunction (pseudonormalization).   2. No formed LV mural thrombus by Definity contrast.   3. Right ventricular systolic function is normal. The right ventricular  size is normal. Tricuspid regurgitation signal is inadequate for assessing  PA  pressure.   4. The mitral valve is degenerative. Mild mitral valve regurgitation.   5. The aortic valve is tricuspid. Aortic valve regurgitation is not  visualized. No aortic stenosis is present. Aortic valve mean gradient  measures 4.0 mmHg.   6. The inferior vena cava is normal in size with greater than 50%  respiratory variability, suggesting right atrial pressure of 3 mmHg.   Comparison(s): Prior images reviewed side by side. LVEF stable in range of  25-30%.    Echo 02/2022:  1. Left ventricular ejection fraction, by estimation, is 25 to 30%. The  left ventricle has severely decreased function. The left ventricle  demonstrates global hypokinesis with mid to apical septal akinesis and  akinesis of the true apex. The left  ventricular internal cavity size was mildly dilated. Left ventricular  diastolic parameters are consistent with Grade I diastolic dysfunction  (impaired relaxation).   2. Right ventricular systolic function is mildly reduced. The right  ventricular size is normal. Tricuspid regurgitation signal is inadequate  for assessing PA pressure.   3. The mitral valve is normal in structure. No evidence of mitral valve  regurgitation. No evidence of mitral stenosis.   4. The aortic valve is tricuspid. There is mild calcification of the  aortic valve. Aortic valve regurgitation is not visualized. No aortic  stenosis is present.   5. Aortic dilatation noted. There is mild dilatation of the aortic root,  measuring 38 mm.   6. The inferior vena cava is normal in size with greater than 50%  respiratory variability, suggesting right atrial pressure of 3 mmHg.  Carotid duplex 02/2022: Summary:  Right Carotid: The extracranial vessels were near-normal with only minimal wall thickening or plaque.   Left Carotid: The extracranial vessels were near-normal with only minimal wall thickening or plaque.   Vertebrals:  Bilateral vertebral arteries demonstrate antegrade flow.   Subclavians: Normal flow hemodynamics were seen in bilateral subclavian arteries.  Lexiscan 10/2021:       Findings are consistent with large prior anteroseptal  myocardial infarction. There is no current ischemia.  The study is high risk based on decreased LVEF alone, there is no current myocardium at jeopardy. Consider correlating LVEF with echo.   No ST deviation was noted.   LV perfusion is abnormal.   Left ventricular function is abnormal. Nuclear stress EF: 24 %. End diastolic cavity size is severely enlarged.     Right/left heart cath 08/2016: Mid RCA-2 lesion, 30 %stenosed. Mid RCA-1 lesion, 30 %stenosed. Ost Ramus to Ramus lesion, 20 %stenosed. Ost LAD to Prox LAD lesion, 10 %stenosed. Prox LAD to Mid LAD lesion, 60 %stenosed. A STENT SYNERGY DES 2.25X12 drug eluting stent was successfully placed. 2nd Diag lesion, 99 %stenosed. Post intervention, there is a 0% residual stenosis.   1. Patent stent proximal LAD 2. Moderate stenosis mid LAD. FFR was 0.84 suggesting the stenosis was not flow limiting.  3. The Diagonal branch is a moderate caliber vessel with a 99% stenosis.  4. Successful PTCA/DES x 1 Diagonal branch 5. Mild disease RCA   Recommendations: Will continue  ASA and Plavix for now. He will need to be started on coumadin tomorrow before discharge for his LV thrombus. He can stop ASA when his INR is therapeutic on coumadin since he was not an ACS. I would plan long term coumadin for LV thrombus and Plavix for at least one year given placement of DES. Continue statin and beta blocker.      Physical Exam:   VS:  BP 116/70   Pulse 80   Ht 5\' 10"  (1.778 m)   Wt 250 lb 12.8 oz (113.8 kg)   SpO2 98%   BMI 35.99 kg/m    Wt Readings from Last 3 Encounters:  04/06/23 250 lb 12.8 oz (113.8 kg)  03/25/23 247 lb 3.2 oz (112.1 kg)  03/05/23 254 lb (115.2 kg)    GEN: Obese, 58 year old male in no acute distress NECK: No JVD; No carotid bruits CARDIAC: S1/S2, RRR, no  murmurs, rubs, gallops RESPIRATORY:  Clear to auscultation without rales, wheezing or rhonchi ABDOMEN: Soft, non-tender, non-distended EXTREMITIES:  No edema; No deformity   ASSESSMENT AND PLAN: .    Chronic systolic CHF, DOE Stage C, NYHA class II-III symptoms.  EF 01/2023 25 to 30%.  Euvolemic and well compensated on exam. Continue current medication regimen.  Low sodium diet, fluid restriction <2L, and daily weights encouraged. Educated to contact our office for weight gain of 2 lbs overnight or 5 lbs in one week.  Continue follow-up with heart failure clinic as scheduled.  He is due for follow-up in May with heart failure clinic.  Will help arrange this follow-up for him.  2.  CAD Denies any chest pain.  No indication for ischemic evaluation.  NST in 2023 was negative for current ischemia.  Dr. Elwyn Lade recommended CPAP when able in February 2025.  Will reach out to Dr. Elwyn Lade regarding this.  Continue aspirin, Lipitor, carvedilol, Entresto, and nitroglycerin as needed.  Care and ED precautions discussed.    3. HTN Blood stable today. Discussed SBP goal is less than 130. No medication changes at this time.  Encourage adequate hydration. Discussed to monitor BP at home at least 2 hours after medications and sitting for 5-10 minutes. Heart healthy diet and regular cardiovascular exercise encouraged.    4. HLD LDL 62 last year.  He is currently at goal.  Continue atorvastatin.  Heart healthy diet and regular cardiovascular exercise encouraged.   5. OSA He is s/p implantation of hypoglossal nerve stimulator with ENT, Dr. Christia Reading on 11/18/2022.  Continue to follow-up with Dr. Jenne Pane, recommended to f/u with Dr. Vassie Loll.  6.  Fatigue Etiology multifactorial.  Denies any chest pain.  Recommended adequate fluid intake as mentioned above.  Continue follow-up with PCP for further evaluation.  Dispo: Care and ED precautions discussed.  Follow-up with me/APP in 4 months or sooner if anything changes.    Signed, Sharlene Dory, NP

## 2023-04-08 ENCOUNTER — Telehealth (HOSPITAL_COMMUNITY): Payer: Self-pay

## 2023-04-08 NOTE — Telephone Encounter (Signed)
 Lvm to confirm 04/12/23 by 12 on  04/09/23

## 2023-04-09 NOTE — Telephone Encounter (Signed)
 04/12/23 appt confirmed by pt

## 2023-04-12 ENCOUNTER — Encounter (HOSPITAL_COMMUNITY): Payer: Self-pay | Admitting: Registered Nurse

## 2023-04-12 ENCOUNTER — Ambulatory Visit (INDEPENDENT_AMBULATORY_CARE_PROVIDER_SITE_OTHER): Admitting: Registered Nurse

## 2023-04-12 VITALS — BP 137/88 | HR 84 | Ht 70.0 in | Wt 251.2 lb

## 2023-04-12 DIAGNOSIS — F411 Generalized anxiety disorder: Secondary | ICD-10-CM | POA: Diagnosis not present

## 2023-04-12 DIAGNOSIS — F331 Major depressive disorder, recurrent, moderate: Secondary | ICD-10-CM | POA: Diagnosis not present

## 2023-04-12 MED ORDER — ESCITALOPRAM OXALATE 20 MG PO TABS: 20.0000 mg | ORAL_TABLET | Freq: Every day | ORAL | 1 refills | Status: DC

## 2023-04-12 MED ORDER — BUSPIRONE HCL 5 MG PO TABS
5.0000 mg | ORAL_TABLET | Freq: Two times a day (BID) | ORAL | 0 refills | Status: DC
Start: 2023-04-12 — End: 2023-04-26

## 2023-04-12 NOTE — Patient Instructions (Addendum)
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 stress response Assesses memory, attention, and problem-solving skills affected by mood disorders Traumatic Brain Injury (TBI) and Concussions Measures cognitive changes due to brain injuries Helps monitor recovery progress and guide rehabilitation efforts Dementia and Neurodegenerative  Disorders Evaluates memory, processing speed, and executive function in conditions like Alzheimer's and Parkinson's disease Assists in early detection and progression monitoring  Benefits of Neuropsychological Testing Accurate Diagnosis - Differentiates ADHD from other conditions with similar symptoms, such as anxiety or learning disabilities Personalized Treatment Planning - Identifies cognitive strengths and weaknesses to tailor individualized intervention Tracking Progress - Enables clinicians to monitor changes over time and assess the effectiveness of treatments or medication Guiding Educational and Workplace Accommodations - Provides data to support school or workplace modifications for individuals struggling with cognitive challenges  Who Should Consider Neuropsychological Testing? Individuals experiencing difficulties with attention, memory, problem-solving, or emotional regulation may benefit from neuropsychological testing. This assessment is ideal for: Children and adults with suspected ADHD or learning disabilities Individuals with behavioral or emotional challenges affecting daily life Those recovering from neurological injuries or illnesses impacting cognitive function Anyone seeking a deeper understanding of their cognitive abilities to optimize performance in school, work, or daily activities  What to Expect During a Neuropsychological Evaluation The testing process typically includes: Clinical Interview - Gathering medical and developmental history Standardized Testing - Completing various cognitive tasks on a computer or paper Behavioral Assessments - Using rating scales and questionnaires completed by the individual and/or caregivers Comprehensive Report - A detailed breakdown of strengths, weaknesses, and personalized recommendations    Offers Virtual Therapy  [address] [place], [place] [address]  Phone:  [phone] Fax: [phone]  [facility]  Outpatient Program (OP) An Outpatient Treatment Program, or OP, is a service for individuals seeking support for substance abuse or mental health concerns who do not require frequent or intense support or safety monitoring. This level is appropriate for people with less severe disorders, or as a step-down from more intensive services. At [facility], OP is available for individuals aged 33 and above. It consists of basic treatment services offered in individual and group format and totals to less than 9 hours a week. Both virtual and in-person options for attending are available. OP is conducted by treatment professionals licensed to serve individuals with mental health and substance use disorders within [place]. OP helps individuals address a broad range of psychological and interpersonal challenges including: Substance Related Disorders Behavioral Addictions Anxiety Depression Trauma and Stressor Related Issues PTSD Parenting and Family Issues Relationship Issues Stress Self-Esteem Bipolar Disorders Life Transitions Personality Disorders ADHD Grief and Loss  *[facility] is proud to offer specialized treatment services on an outpatient level including, EMDR, Marriage/Family Counseling and [facility] Counseling  We currently accept all major insurance, including: Medicaid,Uninsured, [employer], [address], [place], [employer], [place], [employer], [employer], [employer], [employer] [facility] Phone:  [phone] Website:  [email or web address]           How to get started with virtual therapy covered  by Medicaid: Medicaid-covered members can call our Admissions Team 24/7 or fill out our online form to learn about their plan's specific benefits and get started with treatment. Once we verify your Medicaid benefits, our Clinical Team will conduct a thorough mental health assessment to create  your personalized treatment plan. Medicaid members can get started with their personalized treatment plan (which includes curated groups, individual therapy, and family therapy) in as little as 24 hours. Call [phone]  What we Treat:  Anxiety Treatment for Teens and Adults  Our therapists specialize in cognitive behavioral therapy, a leading anxiety treatment, to provide evidence-based mental healthcare for teens and adults dealing with anxiety disorders. Fill out the short form below or call us directly to start healing from anxiety today with [facility].  Depression Treatment for Teens and Adults Depression affects millions of people worldwide, but healing is possible with evidence-based treatment.   Trauma Treatment for Teens and Adults After surviving trauma, building connections and receiving trauma-informed care are critical for long-lasting healing. That's why [facility] offers trauma-informed therapy in individual and group sessions.   Self-Harm Treatment for Teens and Adults Self-harm is often linked to serious mental health issues, which is why understanding the root of self-harm is key to long-lasting recovery.   Suicidal ideation Passive suicidal ideation, chronic suicidal ideation, previous suicide attempt  Substance Use Disorders Treatment for Teens and Adults:   Alcohol, marijuana, prescription drugs, opioids, amphetamines, cocaine, inhalants, hallucinogens, nicotine Understanding the mental health roots of substance use disorders (SUD) is key to long-lasting recovery.

## 2023-04-12 NOTE — Progress Notes (Signed)
 Psychiatric Initial Adult Assessment   Patient Identification: [name] [name]. MRN:  [id number] Date of Evaluation:  04/12/2023 Referral Source: Sharlene Dory, NP [facility] Chief Complaint:   Chief Complaint  Patient presents with   Establish Care    Medication management   Visit Diagnosis:    ICD-10-CM   1. MDD (major depressive disorder), recurrent episode, moderate (HCC)  F33.1 escitalopram (LEXAPRO) 20 MG tablet    busPIRone (BUSPAR) 5 MG tablet    CBC with Differential    Comprehensive metabolic panel    HgB A1c    Lipid panel    Magnesium    Prolactin    TSH    CANCELED: TSH    CANCELED: CBC with Differential    CANCELED: Lipid panel    CANCELED: Comprehensive metabolic panel    CANCELED: HgB A1c    CANCELED: Magnesium    CANCELED: Prolactin    2. GAD (generalized anxiety disorder)  F41.1 escitalopram (LEXAPRO) 20 MG tablet      History of Present Illness:  [name] [name]. 58 y.o. male presents to office today to establish care for medication management.  He is seen face to face by this provider, and chart reviewed on 04/12/23.  His psychiatric history is significant for major depressive disorder and general anxiety disorder.  He denies prior suicide attempt, self-injurious behaviors, psychiatric hospitalization, and outpatient psychiatric services.  Reports he is currently taking Lexapro 20 mg daily that is prescribed by his primary care provider.  He reports only on the psychotropic taken is [employer].  He reports his current stressor is having to care for his elderly mother which causes him stress and his doctors are worried that the stress is "going to cause me to have another heart attack."  Patient reports he lives with his mother who is unable to care for herself and that he is unable to care for himself.  "She requires 24 hours care and she don't want to go into a nursing home.  Everybody is expecting me to care for her and she sits around constantly  complaining.  I can't even leave the house without her calling me back.  There her brother in law that lives in [place] is calling me everyday to bring him something.  He states he wants to move out and get a place on his own but his mother makes him feel guilty so he stays.   He also reports because of his heart condition he often gets tired "and when I'm driving, if I get tired I'll pull over.  The police have stopped several times checking on me, the last time I got a citation for driving while impaired and I had to go down to the station to get blood work.  I'm not worried about it coming back positive for anything because I don't drink or do drugs but when went to court the blood work wasn't back so the court date had to be rescheduled.  Now I'm worrying that my blood work will get mixed up with someone else's and I've never been arrested or had a ticket.  So that is something else that is causing me stress."    He reports he hasn't had any passive or active suicidal thoughts "I like living to much."  Today he denies suicidal/self-harm/homicidal ideation, psychosis, paranoia, and abnormal movements.  He reports he does have mood swings at time.  "I'm usually fine but at times I can be happy and 30 minutes  later she can aggravate.  She tries to upset me most of the time."  Screenings PHQ 2/9, C-SSRS, GAD 7, AUDIT, and AIMS conducted during today's visit, see scores below.    Recommended the following:  Add Buspar 5 mg bid, continue Lexapro 20 mg daily and referral to psychotherapy.  Labs ordered since no recent labs.  He was informed of side effect/efficacy profile of Buspar and that it would take a couple of weeks before notable improvements would be seen.  He expresses understanding with information being given to him today and is agreeable to recommendations.   Associated Signs/Symptoms: Depression Symptoms:  depressed mood, fatigue, anxiety, loss of energy/fatigue, (Hypo) Manic Symptoms:  Irritable  Mood, Anxiety Symptoms:  Excessive Worry, Psychotic Symptoms:   Denies PTSD Symptoms: NA  Past Psychiatric History: Major depression, general anxiety  Previous Psychotropic Medications: Yes  Vraylar  Substance Abuse History in the last 12 months:  No.  Consequences of Substance Abuse: NA  Past Medical History:  Past Medical History:  Diagnosis Date   Anxiety    Arthritis    in lower back   Chronic heart failure (HCC)    Complication of anesthesia    with mask and gas   Coronary artery disease    a. 02/2008: anterior STEMI s/p DES to prox LAD, b. Myoview 2012: scar in the anterior wall with mild ischemia and EF 33%, c. neg GXT 06/2011  d. 08/2016: cath with DES to diagonal    Depression with anxiety    Diabetes mellitus    TYPE II   Dyspnea    with exertion   ED (erectile dysfunction)    Fatty liver    H/O ELEVATED LIVER ENZYMES   Headache    High cholesterol    Hyperlipidemia    Hypertension    Ischemic cardiomyopathy 08/14/2016   a. Previous EF 33-35% 2012, b. 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 [facility])    Myocardial infarction (HCC)    Noncompliance    Prior hx of med noncompliance   PONV (postoperative nausea and vomiting)    Short-term memory loss    per pt.   Sleep apnea    unable to tolerate CPAP    Past Surgical History:  Procedure Laterality Date   CORONARY ANGIOPLASTY     CORONARY PRESSURE/FFR STUDY N/A 08/21/2016   Procedure: INTRAVASCULAR PRESSURE WIRE/FFR STUDY;  Surgeon: Kathleene Hazel, MD;  Location: [facility] INVASIVE CV LAB;  Service: Cardiovascular;  Laterality: N/A;   CORONARY STENT INTERVENTION N/A 08/21/2016   Procedure: CORONARY STENT INTERVENTION;  Surgeon: Kathleene Hazel, MD;  Location: [facility] INVASIVE CV LAB;  Service: Cardiovascular;  Laterality: N/A;   CYSTOSCOPY N/A 12/04/2019   Procedure: CYSTOSCOPY;  Surgeon: Malen Gauze, MD;  Location: [facility] ORS;  Service: Urology;   Laterality: N/A;   DRUG INDUCED ENDOSCOPY Bilateral 06/24/2022   Procedure: DRUG INDUCED SLEEP ENDOSCOPY;  Surgeon: Christia Reading, MD;  [facility] OR;  Service: ENT;  Laterality: Bilateral;   IMPLANTATION OF HYPOGLOSSAL NERVE STIMULATOR Right 11/18/2022   Procedure: IMPLANTATION OF HYPOGLOSSAL NERVE STIMULATOR;  Surgeon: Christia Reading, MD;  Location: [facility] OR;  Service: ENT;  Laterality: Right;   LIVER BIOPSY     MI WITH STENTS     NASAL SEPTOPLASTY W/ TURBINOPLASTY N/A 01/25/2019   Procedure: BILATERAL TURBINATE REDUCTION;  Surgeon: Newman Pies, MD;  Location: [facility] OR;  Service: ENT;  Laterality: N/A;   NASAL SINUS SURGERY  RIGHT/LEFT HEART CATH AND CORONARY ANGIOGRAPHY N/A 08/21/2016   Procedure: Right/Left Heart Cath and Coronary Angiography;  Surgeon: Kathleene Hazel, MD;  Location: [facility] INVASIVE CV LAB;  Service: Cardiovascular;  Laterality: N/A;   TRANSURETHRAL RESECTION OF PROSTATE N/A 12/04/2019   Procedure: TRANSURETHRAL RESECTION OF THE PROSTATE (TURP);  Surgeon: Malen Gauze, MD;  Location: [facility] ORS;  Service: Urology;  Laterality: N/A;    Family Psychiatric History: Report he is unsure of mothers diagnosis but she has been admitted to psychiatric hospital in past  Family History:  Family History  Problem Relation Age of Onset   Coronary artery disease Other        UNKNOWN   Heart disease Father        CAD s/p CABG   Diabetes Mellitus II Father    Diabetes Mellitus II Mother    Cancer Brother     Social History:   Social History   Socioeconomic History   Marital status: Legally Separated    Spouse name: Not on file   Number of children: 0   Years of education: Not on file   Highest education level: GED or equivalent  Occupational History   Not on file  Tobacco Use   Smoking status: Never    Passive exposure: Never   Smokeless tobacco: Never  Vaping Use   Vaping status: Never Used  Substance and Sexual Activity   Alcohol use: Never   Drug use: Never    Sexual activity: Not Currently  Other Topics Concern   Not on file  Social History Narrative   Not on file   Social Drivers of Health   Financial Resource Strain: Low Risk  (03/24/2022)   Received from [facility]   Overall Financial Resource Strain (CARDIA)    Difficulty of Paying Living Expenses: Not hard at all  Food Insecurity: No Food Insecurity (03/24/2022)   Received from [facility], [facility]   Hunger Vital Sign    Worried About Running Out of Food in the Last Year: Never true    Ran Out of Food in the Last Year: Never true  Transportation Needs: No Transportation Needs (03/24/2022)   Received from [employer], [facility]   PRAPARE - Transportation    Lack of Transportation (Medical): No    Lack of Transportation (Non-Medical): No  Physical Activity: Not on file  Stress: Not on file  Social Connections: Unknown (12/01/2021)   Received from [facility], [facility]   Social Network    Social Network: Not on file    Allergies:  No Known Allergies  Metabolic Disorder Labs: Lab Results  Component Value Date   HGBA1C 7.4 (H) 12/04/2019   MPG 165.68 12/04/2019   MPG 177.16 01/18/2019   No results found for: "PROLACTIN" Lab Results  Component Value Date   CHOL 216 (H) 08/07/2016   TRIG 118 08/07/2016   HDL 34 (L) 08/07/2016   CHOLHDL 6.4 08/07/2016   VLDL 24 08/07/2016   LDLCALC 158 (H) 08/07/2016   LDLCALC 28 07/07/2011   Lab Results  Component Value Date   TSH 2.090 08/07/2016    Current Medications: Current Outpatient Medications  Medication Sig Dispense Refill   acetaminophen (TYLENOL) 650 MG CR tablet Take 1,300-1,950 mg by mouth daily as needed for pain.     AMBULATORY NON FORMULARY MEDICATION 0.2 mLs by Intracavernosal route as needed. Medication Name: Trimix  PGE Pap 30mg  Phent 1mg  5 mL 5   aspirin EC 81  MG tablet Take 81 mg by mouth daily.     atorvastatin (LIPITOR) 80 MG tablet Take 1 tablet by mouth once  daily 90 tablet 0   BD PEN NEEDLE NANO 2ND GEN 32G X 4 MM MISC USE AS DIRECTED ONCE DAILY WITH SOLIQUA     Blood Pressure Monitor DEVI 1 each by Does not apply route daily. 1 each 0   busPIRone (BUSPAR) 5 MG tablet Take 1 tablet (5 mg total) by mouth 2 (two) times daily. 60 tablet 0   carvedilol (COREG) 25 MG tablet Take 25 mg by mouth 2 (two) times daily.     dapagliflozin propanediol (FARXIGA) 10 MG TABS tablet Take 10 mg by mouth daily.     desloratadine (CLARINEX) 5 MG tablet Take 1 tablet (5 mg total) by mouth daily. (Patient taking differently: Take 5 mg by mouth daily as needed (allergies).) 90 tablet 3   fluticasone (FLONASE) 50 MCG/ACT nasal spray Place 2 sprays into both nostrils daily. 16 g 6   glipiZIDE (GLUCOTROL XL) 10 MG 24 hr tablet Take 10 mg by mouth daily.     nitroGLYCERIN (NITROSTAT) 0.4 MG SL tablet Place 1 tablet (0.4 mg total) under the tongue every 5 (five) minutes as needed for chest pain. 30 tablet 1   sacubitril-valsartan (ENTRESTO) 97-103 MG Take 1 tablet by mouth twice daily 60 tablet 5   Semaglutide, 1 MG/DOSE, (OZEMPIC, 1 MG/DOSE,) 2 MG/1.5ML SOPN Inject 1 mg into the skin every Saturday.     solifenacin (VESICARE) 5 MG tablet Take 1 tablet (5 mg total) by mouth daily. 30 tablet 11   spironolactone (ALDACTONE) 25 MG tablet Take 0.5 tablets (12.5 mg total) by mouth daily. 45 tablet 3   tamsulosin (FLOMAX) 0.4 MG CAPS capsule TAKE 1 CAPSULE BY MOUTH ONCE DAILY AFTER SUPPER 30 capsule 0   TOUJEO MAX SOLOSTAR 300 UNIT/ML Solostar Pen Inject 72 Units into the skin daily.     Vericiguat (VERQUVO) 5 MG TABS Take 1 tablet (5 mg total) by mouth daily. 30 tablet 3   Vibegron (GEMTESA) 75 MG TABS Take 1 tablet (75 mg total) by mouth daily. 30 tablet 11   escitalopram (LEXAPRO) 20 MG tablet Take 1 tablet (20 mg total) by mouth daily. 30 tablet 1   No current facility-administered medications for this visit.    Musculoskeletal: Strength & Muscle Tone: within normal  limits Gait & Station: normal Patient leans: N/A  Psychiatric Specialty Exam: Review of Systems  Respiratory:         History of sleep apnea   Cardiovascular:  Negative for chest pain and palpitations.       Reports 2 prior heart attacks  Psychiatric/Behavioral:  Positive for dysphoric mood and sleep disturbance (Reports related to sleep apnea). Negative for agitation, decreased concentration, hallucinations and suicidal ideas. The patient is nervous/anxious.     Blood pressure 137/88, pulse 84, height 5\' 10"  (1.778 m), weight 251 lb 3.2 oz (113.9 kg), SpO2 96%.Body mass index is 36.04 kg/m.  General Appearance: Casual  Eye Contact:  Good  Speech:  Clear and Coherent and Normal Rate  Volume:  Normal  Mood:  Anxious and Depressed  Affect:  Congruent  Thought Process:  Coherent, Goal Directed, and Descriptions of Associations: Intact  Orientation:  Full (Time, Place, and Person)  Thought Content:  WDL and Logical  Suicidal Thoughts:  No  Homicidal Thoughts:  No  Memory:  Immediate;   Good Recent;   Good Remote;   Good  Judgement:  Intact  Insight:  Present  Psychomotor Activity:  Normal  Concentration:  Concentration: Good and Attention Span: Good  Recall:  Good  Fund of Knowledge:Good  Language: Good  Akathisia:  No  Handed:  Right  AIMS (if indicated):  done  Assets:  Communication Skills Desire for Improvement Housing Leisure Time Resilience Transportation  ADL's:  Intact  Cognition: WNL  Sleep:  Fair   Screenings: AIMS    Flowsheet Row Office Visit from 04/12/2023 in [facility] [place]  AIMS Total Score 0      GAD-7    Flowsheet Row Office Visit from 04/12/2023 in [facility] [place]  Total GAD-7 Score 16      PHQ2-9    Flowsheet Row Office Visit from 04/12/2023 in [facility] [place] Nutrition from 01/04/2017 in [facility]  Education Services at [place] Nutrition from 10/13/2016 in [facility] Diabetes Education Services at [facility] Total Score 3 0 0  PHQ-9 Total Score 18 -- --      Flowsheet Row Admission (Discharged) from 11/18/2022 in [place] [facility] Admission (Discharged) from 06/24/2022 in [place] [facility]  C-SSRS RISK CATEGORY No Risk No Risk      Assessment and Plan:  Assessment: Patient seen and examined as noted above. Summary: Today [name] [name]. reports symptoms of depression and anxiety exacerbated related to his current heart condition, caring for his elderly mother whom he doesn't feel that he is able to care for but has no other choice and is cause him more stress, worrying about a citation that was that he has yet to go to court for and worrying that his blood will be mixed up with someone else's.  Currently taking Lexapro 20 mg but unsure if it is working because of "all the stress I've been under lately."  He denies suicidal/self-harm/homicidal ideation, psychosis, paranoia, and abnormal movements.     During visit he is dressed appropriate for age and weather.  He is sitting upright/relaxed on love seat, with no noted distress.  He is alert/oriented x 4, calm/cooperative and mood is congruent with affect.  He spoke in a clear tone at moderate volume, and normal pace, with good eye contact.  His thought process is coherent, relevant, and there is no indication that he is currently responding to internal/external stimuli or experiencing delusional thought content.    1. MDD (major depressive disorder), recurrent episode, moderate (HCC) (Primary) - escitalopram (LEXAPRO) 20 MG tablet; Take 1 tablet (20 mg total) by mouth daily.  Dispense: 30 tablet; Refill: 1 - busPIRone (BUSPAR) 5 MG tablet; Take 1 tablet (5 mg total) by mouth 2 (two) times daily.  Dispense: 60 tablet; Refill: 0 - CBC with Differential - Comprehensive metabolic panel - HgB A1c -  Lipid panel - Magnesium - Prolactin - TSH  2. GAD (generalized anxiety disorder) - escitalopram (LEXAPRO) 20 MG tablet; Take 1 tablet (20 mg total) by mouth daily.  Dispense: 30 tablet; Refill: 1  Plan: Medications: Meds ordered this encounter  Medications   escitalopram (LEXAPRO) 20 MG tablet    Sig: Take 1 tablet (20 mg total) by mouth daily.    Dispense:  30 tablet    Refill:  1    Supervising Provider:   Lolly Mustache, SYED T [[id number]]   busPIRone (BUSPAR) 5 MG tablet    Sig: Take 1 tablet (5 mg total) by  mouth 2 (two) times daily.    Dispense:  60 tablet    Refill:  0    Supervising Provider:   Kathryne Sharper T [[id number]]    Labs:  ordered Lab Orders         CBC with Differential         Comprehensive metabolic panel         HgB A1c         Lipid panel         Magnesium         Prolactin         TSH     Other:  Referral to psychotherapy instructed to call [phone], mobile crisis, or present to the nearest emergency room should he experience any suicidal/homicidal ideation, auditory/visual/hallucinations, or detrimental worsening of his mental health condition.   Patient has participated in the development of this treatment plan and verbalized agreement with plan as listed.  Follow Up: Return in 2 weeks for medication management follow up Call in the interim for any side-effects, decompensation, questions, or problems  Collaboration of Care: Medication Management AEB Medication assessment, refill, starting Buspar, Referral or follow-up with counselor/therapist AEB referral to psychotherapy, and Other Labs ordered  Patient/Guardian was advised Release of Information must be obtained prior to any record release in order to collaborate their care with an outside provider. Patient/Guardian was advised if they have not already done so to contact the registration department to sign all necessary forms in order for [place] to release information regarding their care.   Consent: Patient/Guardian  gives verbal consent for treatment and assignment of benefits for services provided during this visit. Patient/Guardian expressed understanding and agreed to proceed.   Marylin Lathon, NP 3/24/20259:56 AM

## 2023-04-13 LAB — COMPREHENSIVE METABOLIC PANEL
AG Ratio: 1.8 (calc) (ref 1.0–2.5)
ALT: 34 U/L (ref 9–46)
AST: 21 U/L (ref 10–35)
Albumin: 4.3 g/dL (ref 3.6–5.1)
Alkaline phosphatase (APISO): 63 U/L (ref 35–144)
BUN: 16 mg/dL (ref 7–25)
CO2: 25 mmol/L (ref 20–32)
Calcium: 10 mg/dL (ref 8.6–10.3)
Chloride: 105 mmol/L (ref 98–110)
Creat: 0.95 mg/dL (ref 0.70–1.30)
Globulin: 2.4 g/dL (ref 1.9–3.7)
Glucose, Bld: 111 mg/dL — ABNORMAL HIGH (ref 65–99)
Potassium: 4.5 mmol/L (ref 3.5–5.3)
Sodium: 140 mmol/L (ref 135–146)
Total Bilirubin: 0.3 mg/dL (ref 0.2–1.2)
Total Protein: 6.7 g/dL (ref 6.1–8.1)
eGFR: 93 mL/min/{1.73_m2} (ref >=60)

## 2023-04-13 LAB — CBC WITH DIFFERENTIAL/PLATELET
Absolute Lymphocytes: 2416 {cells}/uL (ref 850–3900)
Absolute Monocytes: 810 {cells}/uL (ref 200–950)
Basophils Absolute: 37 {cells}/uL (ref 0–200)
Basophils Relative: 0.5 %
Eosinophils Absolute: 131 {cells}/uL (ref 15–500)
Eosinophils Relative: 1.8 %
HCT: 48.9 % (ref 38.5–50.0)
Hemoglobin: 16.4 g/dL (ref 13.2–17.1)
MCH: 30.3 pg (ref 27.0–33.0)
MCHC: 33.5 g/dL (ref 32.0–36.0)
MCV: 90.4 fL (ref 80.0–100.0)
MPV: 10.3 fL (ref 7.5–12.5)
Monocytes Relative: 11.1 %
Neutro Abs: 3906 {cells}/uL (ref 1500–7800)
Neutrophils Relative %: 53.5 %
Platelets: 317 10*3/uL (ref 140–400)
RBC: 5.41 10*6/uL (ref 4.20–5.80)
RDW: 12.9 % (ref 11.0–15.0)
Total Lymphocyte: 33.1 %
WBC: 7.3 10*3/uL (ref 3.8–10.8)

## 2023-04-13 LAB — TSH: TSH: 2.56 m[IU]/L (ref 0.40–4.50)

## 2023-04-13 LAB — LIPID PANEL
Cholesterol: 124 mg/dL (ref <200)
HDL: 29 mg/dL — ABNORMAL LOW (ref >=40)
LDL Cholesterol (Calc): 70 mg/dL
Non-HDL Cholesterol (Calc): 95 mg/dL (ref <130)
Total CHOL/HDL Ratio: 4.3 (calc) (ref <5.0)
Triglycerides: 176 mg/dL — ABNORMAL HIGH (ref <150)

## 2023-04-13 LAB — HEMOGLOBIN A1C
Hgb A1c MFr Bld: 7 %{Hb} — ABNORMAL HIGH (ref <5.7)
Mean Plasma Glucose: 154 mg/dL
eAG (mmol/L): 8.5 mmol/L

## 2023-04-13 LAB — PROLACTIN: Prolactin: 5.3 ng/mL (ref 2.0–18.0)

## 2023-04-13 LAB — MAGNESIUM: Magnesium: 2.3 mg/dL (ref 1.5–2.5)

## 2023-04-18 ENCOUNTER — Other Ambulatory Visit: Payer: Self-pay | Admitting: Urology

## 2023-04-26 ENCOUNTER — Ambulatory Visit (INDEPENDENT_AMBULATORY_CARE_PROVIDER_SITE_OTHER): Admitting: Registered Nurse

## 2023-04-26 ENCOUNTER — Encounter (HOSPITAL_COMMUNITY): Payer: Self-pay | Admitting: Registered Nurse

## 2023-04-26 DIAGNOSIS — F331 Major depressive disorder, recurrent, moderate: Secondary | ICD-10-CM

## 2023-04-26 DIAGNOSIS — F411 Generalized anxiety disorder: Secondary | ICD-10-CM

## 2023-04-26 MED ORDER — ESCITALOPRAM OXALATE 20 MG PO TABS: 20.0000 mg | ORAL_TABLET | Freq: Every day | ORAL | 1 refills | Status: AC

## 2023-04-26 MED ORDER — ARIPIPRAZOLE 2 MG PO TABS: 2.0000 mg | ORAL_TABLET | Freq: Every day | ORAL | 1 refills | Status: AC

## 2023-04-26 MED ORDER — BUSPIRONE HCL 10 MG PO TABS: 5.0000 mg | ORAL_TABLET | Freq: Two times a day (BID) | ORAL | 1 refills | Status: AC

## 2023-04-26 NOTE — Progress Notes (Signed)
 [facility] MD/PA/NP OP Progress Note  04/26/2023 3:01 PM [name] [name].  MRN:  [id number]  Chief Complaint:  Chief Complaint  Patient presents with   Follow-up    Medication management   HPI: [name] [name]. 58 y.o. male presents to office today for medication management follow up.  He is seen face to face this provider, and chart reviewed on 04/26/23.  His psychiatric history is significant for major depressive disorder and general anxiety disorder.  His mental health is currently managed with Lexapro 20 mg daily and Buspar 5 mg Bid.  He reports current medication regimen is "somewhat" managing mental health  without adverse reaction.  He reports less "aggravation" and mood swings.  "I can handle stuff a little better without getting overwhelmed or aggravated."  He reports no change in sleep status but is scheduled for another sleep study "next month at [facility]."  He reports he is eating without difficulty.  Today he suicidal/self-harm/homicidal ideation, psychosis, paranoia, and abnormal movements.      Recommended the following: Continue Lexapro 20 mg daily, Increased 10 mg Bid, Added Abilify 2 mg daily.  He is Informed of side effect/efficacy profile on Abilify.  He is also informed that usually takes a couple of weeks before notable improvements are seen.  He voices understanding with information being given to him today and is agreeable to recommendations.  Lab results reviewed with patient, HgbA1c 7.0, and elevated lipids.  He is currently taking medication for Type 2 diabetes, and hyperlipidemia.  Instructed to follow up with PCP for medication adjustment.  States he will call today to schedule an appointment     Visit Diagnosis:    ICD-10-CM   1. MDD (major depressive disorder), recurrent episode, moderate (HCC)  F33.1 escitalopram (LEXAPRO) 20 MG tablet    busPIRone (BUSPAR) 10 MG tablet    ARIPiprazole (ABILIFY) 2 MG tablet    2. GAD (generalized anxiety disorder)  F41.1  escitalopram (LEXAPRO) 20 MG tablet    busPIRone (BUSPAR) 10 MG tablet      Past Psychiatric History: Major depression, general anxiety  Past Medical History:  Past Medical History:  Diagnosis Date   Anxiety    Arthritis    in lower back   Chronic heart failure (HCC)    Complication of anesthesia    with mask and gas   Coronary artery disease    a. 02/2008: anterior STEMI s/p DES to prox LAD, b. Myoview 2012: scar in the anterior wall with mild ischemia and EF 33%, c. neg GXT 06/2011  d. 08/2016: cath with DES to diagonal    Depression with anxiety    Diabetes mellitus    TYPE II   Dyspnea    with exertion   ED (erectile dysfunction)    Fatty liver    H/O ELEVATED LIVER ENZYMES   Headache    High cholesterol    Hyperlipidemia    Hypertension    Ischemic cardiomyopathy 08/14/2016   a. Previous EF 33-35% 2012, b. 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 [facility])    Myocardial infarction (HCC)    Noncompliance    Prior hx of med noncompliance   PONV (postoperative nausea and vomiting)    Short-term memory loss    per pt.   Sleep apnea    unable to tolerate CPAP    Past Surgical History:  Procedure Laterality Date   CORONARY ANGIOPLASTY  CORONARY PRESSURE/FFR STUDY N/A 08/21/2016   Procedure: INTRAVASCULAR PRESSURE WIRE/FFR STUDY;  Surgeon: Kathleene Hazel, MD;  Location: [facility] INV[facility] LAB;  Service: Cardiovascular;  Laterality: N/A;   CORONARY STENT INTERVENTION N/A 08/21/2016   Procedure: CORONARY STENT INTERVENTION;  Surgeon: Kathleene Hazel, MD;  Location: [facility] LAB;  Service: Cardiovascular;  Laterality: N/A;   CYSTOSCOPY N/A 12/04/2019   Procedure: CYSTOSCOPY;  Surgeon: Malen Gauze, MD;  Location: [facility] ORS;  Service: Urology;  Laterality: N/A;   DRUG INDUCED ENDOSCOPY Bilateral 06/24/2022   Procedure: DRUG INDUCED SLEEP ENDOSCOPY;  Surgeon: Christia Reading, MD;  Location: [facility] OR;  Service: ENT;   Laterality: Bilateral;   IMPLANTATION OF HYPOGLOSSAL NERVE STIMULATOR Right 11/18/2022   Procedure: IMPLANTATION OF HYPOGLOSSAL NERVE STIMULATOR;  Surgeon: Christia Reading, MD;  Location: [facility] OR;  Service: ENT;  Laterality: Right;   LIVER BIOPSY     MI WITH STENTS     NASAL SEPTOPLASTY W/ TURBINOPLASTY N/A 01/25/2019   Procedure: BILATERAL TURBINATE REDUCTION;  Surgeon: Newman Pies, MD;  Location: [facility] OR;  Service: ENT;  Laterality: N/A;   NASAL SINUS SURGERY     RIGHT/LEFT HEART CATH AND CORONARY ANGIOGRAPHY N/A 08/21/2016   Procedure: Right/Left Heart Cath and Coronary Angiography;  Surgeon: Kathleene Hazel, MD;  Location: [facility];  Service: Cardiovascular;  Laterality: N/A;   TRANSURETHRAL RESECTION OF PROSTATE N/A 12/04/2019   Procedure: TRANSURETHRAL RESECTION OF THE PROSTATE (TURP);  Surgeon: Malen Gauze, MD;  Location: [facility];  Service: Urology;  Laterality: N/A;    Family Psychiatric History: Unaware, but reports that his mother was admitted to a psychiatric hospital once but unsure of reason.    Family History:  Family History  Problem Relation Age of Onset   Coronary artery disease Other        UNKNOWN   Heart disease Father        CAD s/p CABG   Diabetes Mellitus II Father    Diabetes Mellitus II Mother    Cancer Brother     Social History:  Social History   Socioeconomic History   Marital status: Legally Separated    Spouse name: Not on file   Number of children: 0   Years of education: Not on file   Highest education level: GED or equivalent  Occupational History   Not on file  Tobacco Use   Smoking status: Never    Passive exposure: Never   Smokeless tobacco: Never  Vaping Use   Vaping status: Never Used  Substance and Sexual Activity   Alcohol use: Never   Drug use: Never   Sexual activity: Not Currently  Other Topics Concern   Not on file  Social History Narrative   Not on file   Social Drivers of Health   Financial Resource  Strain: Low Risk  (03/24/2022)   Received from [facility], [facility]   Overall Financial Resource Strain (CARDIA)    Difficulty of Paying Living Expenses: Not hard at all  Food Insecurity: No Food Insecurity (03/24/2022)   Received from [facility], [facility]   Hunger Vital Sign    Worried About Running Out of Food in the Last Year: Never true    Ran Out of Food in the Last Year: Never true  Transportation Needs: No Transportation Needs (03/24/2022)   Received from [facility], [facility]   PRAPARE - Transportation    Lack of Transportation (Medical): No    Lack of Transportation (  Non-Medical): No  Physical Activity: Not on file  Stress: Not on file  Social Connections: Unknown (12/01/2021)   Received from [facility], [facility]   Social Network    Social Network: Not on file    Allergies: No Known Allergies  Metabolic Disorder Labs:  Reviewed with patient. Instructed to follow up with PCP  Lab Results  Component Value Date   HGBA1C 7.0 (H) 04/12/2023   MPG 154 04/12/2023   MPG 165.68 12/04/2019   Lab Results  Component Value Date   PROLACTIN 5.3 04/12/2023   Lab Results  Component Value Date   CHOL 124 04/12/2023   TRIG 176 (H) 04/12/2023   HDL 29 (L) 04/12/2023   CHOLHDL 4.3 04/12/2023   VLDL 24 08/07/2016   LDLCALC 70 04/12/2023   LDLCALC 158 (H) 08/07/2016   Lab Results  Component Value Date   TSH 2.56 04/12/2023   TSH 2.090 08/07/2016    Current Medications: Current Outpatient Medications  Medication Sig Dispense Refill   ARIPiprazole (ABILIFY) 2 MG tablet Take 1 tablet (2 mg total) by mouth daily. 30 tablet 1   acetaminophen (TYLENOL) 650 MG CR tablet Take 1,300-1,950 mg by mouth daily as needed for pain.     AMBULATORY NON FORMULARY MEDICATION 0.2 mLs by Intracavernosal route as needed. Medication Name: Trimix  PGE Pap 30mg  Phent 1mg  5 mL 5   aspirin EC 81 MG tablet Take 81 mg by mouth daily.     atorvastatin (LIPITOR) 80 MG  tablet Take 1 tablet by mouth once daily 90 tablet 0   BD PEN NEEDLE NANO 2ND GEN 32G X 4 MM MISC USE AS DIRECTED ONCE DAILY WITH SOLIQUA     Blood Pressure Monitor DEVI 1 each by Does not apply route daily. 1 each 0   busPIRone (BUSPAR) 10 MG tablet Take 0.5 tablets (5 mg total) by mouth 2 (two) times daily. 60 tablet 1   carvedilol (COREG) 25 MG tablet Take 25 mg by mouth 2 (two) times daily.     dapagliflozin propanediol (FARXIGA) 10 MG TABS tablet Take 10 mg by mouth daily.     desloratadine (CLARINEX) 5 MG tablet Take 1 tablet (5 mg total) by mouth daily. (Patient taking differently: Take 5 mg by mouth daily as needed (allergies).) 90 tablet 3   escitalopram (LEXAPRO) 20 MG tablet Take 1 tablet (20 mg total) by mouth daily. 30 tablet 1   fluticasone (FLONASE) 50 MCG/ACT nasal spray Place 2 sprays into both nostrils daily. 16 g 6   glipiZIDE (GLUCOTROL XL) 10 MG 24 hr tablet Take 10 mg by mouth daily.     nitroGLYCERIN (NITROSTAT) 0.4 MG SL tablet Place 1 tablet (0.4 mg total) under the tongue every 5 (five) minutes as needed for chest pain. 30 tablet 1   sacubitril-valsartan (ENTRESTO) 97-103 MG Take 1 tablet by mouth twice daily 60 tablet 5   Semaglutide, 1 MG/DOSE, (OZEMPIC, 1 MG/DOSE,) 2 MG/1.5ML SOPN Inject 1 mg into the skin every Saturday.     solifenacin (VESICARE) 5 MG tablet Take 1 tablet (5 mg total) by mouth daily. 30 tablet 11   spironolactone (ALDACTONE) 25 MG tablet Take 0.5 tablets (12.5 mg total) by mouth daily. 45 tablet 3   tamsulosin (FLOMAX) 0.4 MG CAPS capsule TAKE 1 CAPSULE BY MOUTH ONCE DAILY AFTER SUPPER 30 capsule 0   TOUJEO MAX SOLOSTAR 300 UNIT/ML Solostar Pen Inject 72 Units into the skin daily.     Vericiguat (VERQUVO) 5 MG  TABS Take 1 tablet (5 mg total) by mouth daily. 30 tablet 3   Vibegron (GEMTESA) 75 MG TABS Take 1 tablet (75 mg total) by mouth daily. 30 tablet 11   No current facility-administered medications for this visit.      Musculoskeletal: Strength & Muscle Tone: within normal limits Gait & Station: normal Patient leans: N/A  Psychiatric Specialty Exam: Review of Systems  Constitutional:        No other complaints voiced  Psychiatric/Behavioral:  Positive for dysphoric mood (Some improvement) and sleep disturbance (Scheduled for another sleep study next month.). Negative for hallucinations, self-injury and suicidal ideas. Agitation: Improvement.The patient is nervous/anxious (Some improvement).   All other systems reviewed and are negative.   Blood pressure 135/89, pulse 95, SpO2 92%.There is no height or weight on file to calculate BMI.  General Appearance: Casual and Neat  Eye Contact:  Good  Speech:  Clear and Coherent and Normal Rate  Volume:  Normal  Mood:  Euthymic  Affect:  Appropriate and Congruent  Thought Process:  Coherent, Goal Directed, and Descriptions of Associations: Intact  Orientation:  Full (Time, Place, and Person)  Thought Content: WDL and Logical   Suicidal Thoughts:  No  Homicidal Thoughts:  No  Memory:  Immediate;   Good Recent;   Good Remote;   Good  Judgement:  Intact  Insight:  Present  Psychomotor Activity:  Normal  Concentration:  Concentration: Good and Attention Span: Good  Recall:  Good  Fund of Knowledge: Good  Language: Good  Akathisia:  No  Handed:  Right  AIMS (if indicated): not done  Assets:  Communication Skills Desire for Improvement Financial Resources/Insurance Housing Leisure Time Resilience Transportation  ADL's:  Intact  Cognition: WNL  Sleep:  Fair   Screenings: AIMS    Flowsheet Row Office Visit from 04/12/2023 in [facility] at [place]  AIMS Total Score 0      GAD-7    Flowsheet Row Office Visit from 04/12/2023 in [facility] at [place]  Total GAD-7 Score 16      PHQ2-9    Flowsheet Row Office Visit from 04/12/2023 in [facility] [place] [facility] from 01/04/2017 in [facility] [place] [facility] from 10/13/2016 in [facility] at [facility] Total Score 3 0 0  PHQ-9 Total Score 18 -- --      Flowsheet Row Admission (Discharged) from 11/18/2022 in [place] [facility] Admission (Discharged) from 06/24/2022 in [place] [facility]IOPER[facility]IVE ARE[facility]  C-SSRS RISK CATEGORY No Risk No Risk      Assessment and Plan: Assessment: Patient seen and examined as noted above. Summary: Today [name] [name]. appears to be doing fairly well.      He reports current medications are "somewhat" managing his mental health with out adverse reaction.  Reporting minimal improvement in depression and anxiety.  Notes the has noticed that he doesn't feel as overwhelmed as he did before.  He states that he is able to handle "stuff" better without getting overwhelmed or aggravated as easy.      He denies suicidal/self-harm/homicidal ideation, psychosis, paranoia, and abnormal movements.      During visit he is dressed appropriate for age and weather.  He is seated comfortably in chair with no noted distress.  He is alert/oriented x 4, calm/cooperative and mood is congruent with affect.  He spoke in a clear  tone at moderate volume, and normal pace, with good eye contact.  His thought process is coherent, relevant, and there is no indication that he is currently responding to internal/external stimuli or experiencing delusional thought content.    1. MDD (major depressive disorder), recurrent episode, moderate (HCC) Minimal improvement in depression and anxiety symptoms, not feeling as overwhelmed, able to handle things without getting overwhelmed and aggravated.  Continue- escitalopram (LEXAPRO) 20 MG tablet; Take 1 tablet (20 mg total) by mouth daily.  Dispense: 30 tablet; Refill: 1 Increased- busPIRone (BUSPAR) 10 MG tablet; Take 0.5 tablets (5 mg  total) by mouth 2 (two) times daily.  Dispense: 60 tablet; Refill: 1 Start- ARIPiprazole (ABILIFY) 2 MG tablet; Take 1 tablet (2 mg total) by mouth daily.  Dispense: 30 tablet; Refill: 1  2. GAD (generalized anxiety disorder) Minimal improvement in depression and anxiety symptoms, not feeling as overwhelmed, able to handle things without getting overwhelmed and aggravated.  Continue- escitalopram (LEXAPRO) 20 MG tablet; Take 1 tablet (20 mg total) by mouth daily.  Dispense: 30 tablet; Refill: 1 Increased- busPIRone (BUSPAR) 10 MG tablet; Take 0.5 tablets (5 mg total) by mouth 2 (two) times daily.  Dispense: 60 tablet; Refill: 1   Plan: Medications: Meds ordered this encounter  Medications   escitalopram (LEXAPRO) 20 MG tablet    Sig: Take 1 tablet (20 mg total) by mouth daily.    Dispense:  30 tablet    Refill:  1    Supervising Provider:   Lolly Mustache, SYED T [[id number]]   busPIRone (BUSPAR) 10 MG tablet    Sig: Take 0.5 tablets (5 mg total) by mouth 2 (two) times daily.    Dispense:  60 tablet    Refill:  1    Supervising Provider:   Lolly Mustache, SYED T [[id number]]   ARIPiprazole (ABILIFY) 2 MG tablet    Sig: Take 1 tablet (2 mg total) by mouth daily.    Dispense:  30 tablet    Refill:  1    Supervising Provider:   Kathryne Sharper T [[id number]]    Labs:  Reviewed with patient HgbA1c 7.0, and elevated lipids.  He is currently taking medication for Type 2 diabetes, and hyperlipidemia.  Instructed to follow up with PCP for medication adjustment.  States he will call today to schedule an appointment       Other:  Keep scheduled appointment for counseling/therapy 07/01/2022 at 9:00 AM.  If need to be seen sooner follow with resources given.     [name] [name]. is instructed to call 911, [phone], mobile crisis, or present to the nearest emergency room should he experience any suicidal/homicidal ideation, auditory/visual/hallucinations, or detrimental worsening of her mental health condition.   [name] [name]. has  participated in the development of this treatment plan and verbalized  his agreement with plan as listed.  Follow Up: Return in 2 weeks for medication management Call in the interim for any side-effects, decompensation, questions, or problems  Collaboration of Care: Collaboration of Care: Medication Management AEB Medication adjustment and refill, started Abilify, Primary Care Provider AEB Referral to PCP for abnormal lab values and to adjust medications, and Referral or follow-up with counselor/therapist AEB Referral to counseling/therapy  Patient/Guardian was advised Release of Information must be obtained prior to any record release in order to collaborate their care with an outside provider. Patient/Guardian was advised if they have not already done so to contact the registration department to sign all necessary forms in order for [place]  to release information regarding their care.   Consent: Patient/Guardian gives verbal consent for treatment and assignment of benefits for services provided during this visit. Patient/Guardian expressed understanding and agreed to proceed.    Antha Niday, NP 04/26/2023, 3:01 PM

## 2023-04-27 ENCOUNTER — Encounter: Payer: Self-pay | Admitting: Pulmonary Disease

## 2023-05-10 ENCOUNTER — Ambulatory Visit (HOSPITAL_COMMUNITY): Admitting: Registered Nurse

## 2023-05-14 ENCOUNTER — Other Ambulatory Visit (HOSPITAL_COMMUNITY): Payer: Self-pay | Admitting: Cardiology

## 2023-05-16 ENCOUNTER — Other Ambulatory Visit: Payer: Self-pay | Admitting: Urology

## 2023-05-27 ENCOUNTER — Ambulatory Visit (HOSPITAL_BASED_OUTPATIENT_CLINIC_OR_DEPARTMENT_OTHER): Attending: Pulmonary Disease | Admitting: Pulmonary Disease

## 2023-05-27 DIAGNOSIS — G4733 Obstructive sleep apnea (adult) (pediatric): Secondary | ICD-10-CM | POA: Diagnosis present

## 2023-05-27 DIAGNOSIS — Z9682 Presence of neurostimulator: Secondary | ICD-10-CM | POA: Insufficient documentation

## 2023-06-04 ENCOUNTER — Ambulatory Visit: Payer: Self-pay | Admitting: Pulmonary Disease

## 2023-06-04 DIAGNOSIS — G4733 Obstructive sleep apnea (adult) (pediatric): Secondary | ICD-10-CM | POA: Diagnosis not present

## 2023-06-04 NOTE — Procedures (Signed)
 [name] [facility] [address] [place], [place] [address] Tel: [phone]   Fax: [phone]  Inspire Interpretation  Patient Name:  [name] [name], [name] [name] Study Date:  05/27/2023 Referring Physician:  Dr. Celene Coins  Indications for Polysomnography The patient is a 58 year old Male who is 5\' 10"  and weighs 245.0 lbs. His BMI equals 35.5.  A full night inspire treatment study was performed.  Polysomnogram Data A full night polysomnogram recorded the standard physiologic parameters including EEG, EOG, EMG, EKG, nasal and oral airflow.  Respiratory parameters of chest and abdominal movements were recorded with Respiratory Inductance Plethysmography belts.  Oxygen  saturation was recorded by pulse oximetry.   Sleep Architecture The total recording time of the polysomnogram was 393.6 minutes.  The total sleep time was 273.0 minutes.  The patient spent 17.0% of total sleep time in Stage N1, 83.0% in Stage N2, 0.0% in Stages N3, and 0.0% in REM.  Sleep latency was 30.5 minutes.  REM latency was - minutes.  Sleep Efficiency was 69.4%.  Wake after Sleep Onset time was 90.5 minutes.  Inspire Summary The patient was on inspire therapy at levels ranging from 1.5* volts up to 2.2* volts.  The last level used in the study was 2.2* volts.  Respiratory Events The polysomnogram revealed a presence of 1 obstructive, - central, and - mixed apneas resulting in an Apnea index of 0.2 events per hour.  There were 179 hypopneas (>=3% desaturation and/or arousal) resulting in an Apnea\Hypopnea Index (AHI >=3% desaturation and/or arousal) of 39.6 events per hour.  There were 151 hypopneas (>=4% desaturation) resulting in an Apnea\Hypopnea Index (AHI >=4% desaturation) of 33.4 events per hour.  There were 8 Respiratory Effort Related Arousals resulting in a RERA index of 1.8 events per hour. The Respiratory Disturbance Index is 41.3 events per hour.  The snore index was - events per  hour.  Mean oxygen  saturation was 90.3%.  The lowest oxygen  saturation during sleep was 82.0%.  Time spent <=88% oxygen  saturation was 70.8 minutes (18.2%).  Limb Activity There were - limb movements recorded.  Of this total, - were classified as PLMs.  Of the PLMs, - were associated with arousals.  The Limb Movement index was - per hour while the PLM index was - per hour.  Cardiac Summary The average pulse rate was 74.0 bpm.  The minimum pulse rate was 65.0 bpm while the maximum pulse rate was 97.0 bpm.  Cardiac rhythm was normal/abnormal.  Diagnosis: OSA s/p hypoglossal nerve stimulator device  Recommendations: Therapeutic amplitude was achieved at 2.2 V Uptitrate the device to achieve this level of stimulation if tolerated  Weight loss measures encouraged He should be cautioned against driving when sleepy & against medications with sedative side effects.    This study was personally reviewed and electronically signed by: Dr. Celene Coins Accredited Board Certified in Sleep Medicine

## 2023-06-07 ENCOUNTER — Encounter (HOSPITAL_BASED_OUTPATIENT_CLINIC_OR_DEPARTMENT_OTHER): Payer: Self-pay | Admitting: Pulmonary Disease

## 2023-06-07 ENCOUNTER — Ambulatory Visit (HOSPITAL_BASED_OUTPATIENT_CLINIC_OR_DEPARTMENT_OTHER): Admitting: Pulmonary Disease

## 2023-06-07 VITALS — BP 116/80 | HR 82 | Ht 70.0 in | Wt 245.0 lb

## 2023-06-07 DIAGNOSIS — Z9682 Presence of neurostimulator: Secondary | ICD-10-CM

## 2023-06-07 DIAGNOSIS — G4733 Obstructive sleep apnea (adult) (pediatric): Secondary | ICD-10-CM

## 2023-06-07 NOTE — Progress Notes (Signed)
 Subjective:    Patient ID: [name] [name]., male    DOB: [birth year], 58 y.o.   MRN: [id number]  HPI  58 yo with ischemic cardiomyopathy for  FU of OSA -Treatment emergent centrals He was unable to tolerate CPAP He went to the dentist and feels he would not be able to use a mouthguard. 07/2021 EF was 25 to 30%      PMH - AW- STEMI 02/2008, received DES to LAD & D2 in 2018   LVEF  25- 35%, ICD recommended -Diabetes type 2 for 20 years, on insulin  for 1 year     Implanted 11/18/22 01/06/2023 activation visit   Bedtime 11 PM to midnight, sleep latency 30 minutes, wake up 7 to 8 AM, sometimes sleeps until noon Sensation 0.2 V , functional level 0.6 V lower limit, upper limit 1.6 V   Start delay 30 minutes, pause time 15 minutes, duration 8 hours   1 month post activation changed range to 1.0-2.0 V Set to  level 4 = 1.3 V   Readiness check visit  He was able to increase amplitude to current level of 1.8 V.   Post study visit     He experiences some improvement in sleep with fewer awakenings at night but continues to feel tired during the day. He experiences frequent awakenings and daytime fatigue, particularly when watching TV. He does not experience tongue-related awakenings.  A recent sleep study showed he slept for about four and a half hours out of six to seven hours in bed, without entering REM sleep. Apnea events were significantly reduced at the highest device setting.  He uses the hypoglossal nerve stimulator nightly for approximately 6.7 hours without pausing it during brief awakenings. He turns off the device in the morning when his dog wakes him. The current stimulation level is two volts, increased from the initial 1.8 volts. He seeks adjustments to alleviate daytime fatigue.  Significant tests/ events reviewed  Inspite titration 05/2023 >> therapeutic level 2.2 V with residula AHI 3.8/h,, RDI 15/h, low sat 85%   NPSG 08/2018 - wt 234 lbs -TST 337 minutes, RDI  17/hour, AHI 15.5/hour, no supine sleep, lowest desaturation 83%, 3 REM periods   CPAP titration 01/2021 >> sub opimal ? 10 cm, treatment emergent centrals +  PLMs+   01/2022 HST >> AHI 36/h, total of 1311, 31 centrals, 62 mixed apneas, lowest desaturation  67%   08/2022 HST >> AHI 30/h, low sat 63%, few centrals    Review of Systems neg for any significant sore throat, dysphagia, itching, sneezing, nasal congestion or excess/ purulent secretions, fever, chills, sweats, unintended wt loss, pleuritic or exertional cp, hempoptysis, orthopnea pnd or change in chronic leg swelling. Also denies presyncope, palpitations, heartburn, abdominal pain, nausea, vomiting, diarrhea or change in bowel or urinary habits, dysuria,hematuria, rash, arthralgias, visual complaints, headache, numbness weakness or ataxia.     Objective:   Physical Exam  Gen. Pleasant, obese, in no distress ENT - no lesions, no post nasal drip Neck: No JVD, no thyromegaly, no carotid bruits Lungs: no use of accessory muscles, no dullness to percussion, decreased without rales or rhonchi  Cardiovascular: Rhythm regular, heart sounds  normal, no murmurs or gallops, no peripheral edema Musculoskeletal: No deformities, no cyanosis or clubbing , no tremors   Procedure: Hypoglossal nerve stimulator adjustment Description: The patient was instructed to open his mouth. The tongue was observed for motion and midline position at different stimulation levels. Adjustments were made to the  stimulator settings, increasing the level incrementally while monitoring the patient's comfort and tongue position. The final adjustment was set at 2.1 volts, with the tongue remaining midline and the patient reporting comfort.    Assessment & Plan:   Obstructive Sleep Apnea (OSA) OSA status post hypoglossal nerve stimulator implantation. Recent polysomnography indicates a reduction in apnea events from fifty to four per hour at higher stimulation levels.  He reports decreased nocturnal awakenings but persists with daytime fatigue, likely due to insufficient REM sleep. Current stimulation is set at 2.1 volts, with a target of 2.2 volts to further decrease apnea events. we changed his range to 1.9-2.4V He is compliant with device usage, averaging 6.7 hours per night. - Increase stimulation level to 2.2 volts over two weeks. - Ensure compliance with nightly device usage. - Schedule follow-up in three months.

## 2023-06-07 NOTE — Progress Notes (Signed)
 Patient is rescheduled for 06/07/23 w/RA @[facility]  at 1600.

## 2023-06-07 NOTE — Patient Instructions (Signed)
 YOUR PLAN:  -OBSTRUCTIVE SLEEP APNEA (OSA): Obstructive Sleep Apnea is a condition where your airway becomes blocked during sleep, causing breathing pauses. Your recent sleep study showed a reduction in apnea events from fifty to four per hour at higher stimulation levels. To help with your daytime fatigue, we  increased the stimulation level of your device to 2.1 volts today, increase by 1 level to 2.2 V (level 4) over the next two weeks. Please continue using the device nightly and ensure you are compliant with its usage.  INSTRUCTIONS:  Please increase the stimulation level of your hypoglossal nerve stimulator to 2.2 volts over the next two weeks. Continue using the device nightly and ensure compliance. Schedule a follow-up appointment in three months.

## 2023-06-14 ENCOUNTER — Other Ambulatory Visit: Payer: Self-pay | Admitting: Urology

## 2023-06-23 ENCOUNTER — Ambulatory Visit (HOSPITAL_BASED_OUTPATIENT_CLINIC_OR_DEPARTMENT_OTHER): Admitting: Pulmonary Disease

## 2023-06-24 ENCOUNTER — Other Ambulatory Visit (HOSPITAL_COMMUNITY): Payer: Self-pay | Admitting: Cardiology

## 2023-06-29 ENCOUNTER — Telehealth (HOSPITAL_COMMUNITY): Payer: Self-pay

## 2023-06-29 NOTE — Telephone Encounter (Signed)
 07/01/23 appt confirmed by pt

## 2023-07-01 ENCOUNTER — Encounter (HOSPITAL_COMMUNITY): Payer: Self-pay | Admitting: Psychiatry

## 2023-07-01 ENCOUNTER — Ambulatory Visit (INDEPENDENT_AMBULATORY_CARE_PROVIDER_SITE_OTHER): Admitting: Psychiatry

## 2023-07-01 DIAGNOSIS — F331 Major depressive disorder, recurrent, moderate: Secondary | ICD-10-CM | POA: Diagnosis not present

## 2023-07-01 DIAGNOSIS — F411 Generalized anxiety disorder: Secondary | ICD-10-CM

## 2023-07-01 NOTE — Progress Notes (Signed)
 IN-PERSON  Comprehensive Clinical Assessment (CCA) Note  07/01/2023 [name] [name] [id number]  Chief Complaint: Stress /anxiety/depression Visit Diagnosis: Major depressive disorder, recurrent, moderate Generalized anxiety disorder   CCA Biopsychosocial Intake/Chief Complaint:   Everybody wants me to take care of my mother, she is putting a lot of stress and anxiety on me, my doctors are telling me that I don't need this stress because of my heart condition, I am not getting any rest, she needs around the clock attention, she uses a walker, little to no help from othe family members  Current Symptoms/Problems: worrying, irritability, feeling overwhelmed,   Patient Reported Schizophrenia/Schizoaffective Diagnosis in Past: No   Strengths: easy to get along with  Preferences: Individual therapy  Abilities: No data recorded  Type of Services Patient Feels are Needed: Individual therapy - way to cope and not be as bothered as I have been.   Initial Clinical Notes/Concerns: Pt is referred for services by NP Shuvan Rankin. Pt denies any psychiatric hospitalizations and any previous involvement in outpatient therapy.   Mental Health Symptoms Depression:  Change in energy/activity; Difficulty Concentrating; Fatigue; Hopelessness; Increase/decrease in appetite; Irritability; Sleep (too much or little); Weight gain/loss; Worthlessness   Duration of Depressive symptoms: Greater than two weeks   Mania:  Change in energy/activity; Irritability   Anxiety:   Difficulty concentrating; Fatigue; Irritability; Sleep; Tension; Worrying   Psychosis:  None   Duration of Psychotic symptoms: No data recorded  Trauma:  None   Obsessions:  None   Compulsions:  None   Inattention:  None   Hyperactivity/Impulsivity:  None   Oppositional/Defiant Behaviors:  None   Emotional Irregularity:  None   Other Mood/Personality Symptoms:  No data recorded   Mental Status Exam Appearance  and self-care  Stature:  Average   Weight:  Overweight   Clothing:  Casual   Grooming:  Normal   Cosmetic use:  None   Posture/gait:  Normal   Motor activity:  Not Remarkable   Sensorium  Attention:  Normal   Concentration:  Normal   Orientation:  X5   Recall/memory:  Normal   Affect and Mood  Affect:  Appropriate   Mood:  Anxious; Depressed   Relating  Eye contact:  Normal   Facial expression:  Responsive   Attitude toward examiner:  Cooperative   Thought and Language  Speech flow: Normal   Thought content:  Appropriate to Mood and Circumstances   Preoccupation:  Ruminations   Hallucinations:  None   Organization:  No data recorded  [employer] of Knowledge:  Average   Intelligence:  Average   Abstraction:  Normal   Judgement:  Good   Reality Testing:  Realistic   Insight:  Good   Decision Making:  Normal   Social Functioning  Social Maturity:  Responsible   Social Judgement:  Normal   Stress  Stressors:  Surveyor, quantity; Family conflict (caretaker responsibilities for mother, patient's health, financial issues.)   Coping Ability:  Human resources officer Deficits:  No data recorded  Supports:  Family (c ousins)     Religion: Religion/Spirituality Are You A Religious Person?: Yes What is Your Religious Affiliation?: Environmental consultant: Leisure / Recreation Do You Have Hobbies?: Yes Leisure and Hobbies: fishing, take Engineer, manufacturing  Exercise/Diet: Exercise/Diet Do You Exercise?: Yes What Type of Exercise Do You Do?: Run/Walk How Many Times a Week Do You Exercise?: 6-7 times a week Have You Gained or Lost A Significant Amount of Weight  in the Past Six Months?: No Do You Follow a Special Diet?: No Do You Have Any Trouble Sleeping?: Yes Explanation of Sleeping Difficulties: wkes up several times during the night   CCA Employment/Education Employment/Work Situation: Employment / Work Situation Employment  Situation: On disability Why is Patient on Disability: heart attacks (2)  one in 2010 and one in 2018 How Long has Patient Been on Disability: since 2010 What is the Longest Time Patient has Held a Job?: 10 years Where was the Patient 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, product/process development Has Patient ever Been in Equities trader?: No  Education: Education Last Grade Completed: 10 Did Garment/textile technologist From [employer]?: No (Obtained GED) Did You Have Any Scientist, research (life sciences) In School?: agriculture Did You Have An Individualized Education Program (IIEP): No Did You Have Any Difficulty At [employer]?: No Patient's Education Has Been Impacted by Current Illness: No   CCA Family/Childhood History Family and Relationship History: Family history Marital status: Separated (Pt resides with his mother in [place].) Separated, when?: 2017 Does patient have children?: No  Childhood History:  Childhood History By whom was/is the patient raised?: Both parents (Parents divorced when pt was 74 yo.) Additional childhood history information: Pt was born in [place] and reared in [place] Description of patient's relationship with caregiver when they were a child: good Patient's description of current relationship with people who raised him/her: strained with mother, she goes out of her way to aggravate, father is deceased How were you disciplined when you got in trouble as a child/adolescent?: spanking with a belt Does patient have siblings?: Yes Number of Siblings: 1 Description of patient's current relationship with siblings: deceased Did patient suffer any verbal/emotional/physical/sexual abuse as a child?: No Did patient suffer from severe childhood neglect?: No Has patient ever been sexually abused/assaulted/raped as an adolescent or adult?: No Was the patient ever a victim of a crime or a disaster?: No Witnessed domestic violence?: No Has patient been affected by domestic violence as an  adult?: No  Child/Adolescent Assessment: N/A     CCA Substance Use Alcohol/Drug Use: Alcohol / Drug Use Pain Medications: see patient record Prescriptions: see patient record Over the Counter: see patient record History of alcohol / drug use?: No history of alcohol / drug abuse   ASAM's:  Six Dimensions of Multidimensional Assessment  Dimension 1:  Acute Intoxication and/or Withdrawal Potential:   Dimension 1:  Description of individual's past and current experiences of substance use and withdrawal: none  Dimension 2:  Biomedical Conditions and Complications:   Dimension 2:  Description of patient's biomedical conditions and  complications: none  Dimension 3:  Emotional, Behavioral, or Cognitive Conditions and Complications:  Dimension 3:  Description of emotional, behavioral, or cognitive conditions and complications: none  Dimension 4:  Readiness to Change:  Dimension 4:  Description of Readiness to Change criteria: none  Dimension 5:  Relapse, Continued use, or Continued Problem Potential:  Dimension 5:  Relapse, continued use, or continued problem potential critiera description: none  Dimension 6:  Recovery/Living Environment:  Dimension 6:  Recovery/Iiving environment criteria description: none  ASAM Severity Score: ASAM's Severity Rating Score: 0  ASAM Recommended Level of Treatment:     Substance use Disorder (SUD) None  Recommendations for Services/Supports/Treatments: Recommendations for Services/Supports/Treatments Recommendations For Services/Supports/Treatments: Individual Therapy, Medication Management/patient attends the assessment appointment today.  Confidentiality and limits are discussed.  Nutritional assessment, pain assessment, PHQ 2 and 9, C-S SRS, GAD-7 administered.  Individual therapy is recommended 1  time every 1 to 4 weeks to alleviate symptoms of depression and improve coping skills to manage stress and anxiety.  Patient agrees to return for an appointment  in 1 to 4 weeks.  Patient will continue to see NP Shuvon Rankin for medication management.  DSM5 Diagnoses: Patient Active Problem List   Diagnosis Date Noted   Erectile dysfunction 09/24/2022   Painful ejaculation 09/24/2022   BMI 35.0-35.9,adult 06/02/2022   OAB (overactive bladder) 03/15/2020   BPH with urinary obstruction 12/04/2019   Nocturia 04/24/2019   Nasal turbinate hypertrophy 03/22/2018   Type 2 diabetes mellitus with complication (HCC)    OSA (obstructive sleep apnea)    Unstable angina (HCC)    Abnormal LFTs (liver function tests) 07/07/2011   Cardiomyopathy, ischemic 04/24/2010   Mixed hyperlipidemia 09/27/2008   Essential hypertension, benign 09/27/2008   CORONARY ATHEROSCLEROSIS NATIVE CORONARY ARTERY 09/27/2008    Patient Centered Plan: Patient is on the following Treatment Plan(s): Will be developed next session   Referrals to Alternative Service(s): Referred to Alternative Service(s):   Place:   Date:   Time:    Referred to Alternative Service(s):   Place:   Date:   Time:    Referred to Alternative Service(s):   Place:   Date:   Time:    Referred to Alternative Service(s):   Place:   Date:   Time:      Collaboration of Care: Medication Management AEB patient sees NP Shuvon Rankin for medication management  Patient/Guardian was advised Release of Information must be obtained prior to any record release in order to collaborate their care with an outside provider. Patient/Guardian was advised if they have not already done so to contact the registration department to sign all necessary forms in order for us  to release information regarding their care.   Consent: Patient/Guardian gives verbal consent for treatment and assignment of benefits for services provided during this visit. Patient/Guardian expressed understanding and agreed to proceed.   Rynell Ciotti E Derec Mozingo, LCSW

## 2023-07-12 ENCOUNTER — Other Ambulatory Visit: Payer: Self-pay | Admitting: Urology

## 2023-07-22 ENCOUNTER — Ambulatory Visit (HOSPITAL_BASED_OUTPATIENT_CLINIC_OR_DEPARTMENT_OTHER): Admitting: Pulmonary Disease

## 2023-07-28 ENCOUNTER — Ambulatory Visit (INDEPENDENT_AMBULATORY_CARE_PROVIDER_SITE_OTHER): Admitting: Psychiatry

## 2023-07-28 DIAGNOSIS — F331 Major depressive disorder, recurrent, moderate: Secondary | ICD-10-CM

## 2023-07-28 DIAGNOSIS — F411 Generalized anxiety disorder: Secondary | ICD-10-CM | POA: Diagnosis not present

## 2023-07-28 NOTE — Progress Notes (Signed)
 IN-PERSON  THERAPIST PROGRESS NOTE  Session Time: Wednesday 07/28/2023 9:09 AM - 10:00 AM   Participation Level: Active  Behavioral Response: CasualAlertAnxious and Depressed  Type of Therapy: Individual Therapy  Treatment Goals addressed: Learn and implement relaxation techniques  ProgressTowards Goals: Formal treatment plan will be developed next session  Interventions: CBT and Supportive  Summary: [name] [name]. is a 58 y.o. male who is referred for services by NP Shuvan Rankin. Pt denies any psychiatric hospitalizations and any previous involvement in outpatient therapy.  Pt states Everybody wants me to take care of my mother, she is putting a lot of stress and anxiety on me, my doctors are telling me that I don't need this stress because of my heart condition, I am not getting any rest, she needs around the clock attention, she uses a walker, little to no help from othe family members.  Patient's current symptoms include difficulty concentrating, fatigue, irritability, thoughts and feelings of worthlessness/hopelessness, worrying, and muscle tension.  Patient last was seen about 4 weeks ago for the assessment appointment.  He continues to experience symptoms of depression and anxiety as reflected in the PHQ 2 9 and the GAD-7.  Patient reports continued stress regarding the relationship with his mother.  Per patient's report, he moved in with his mother in Aug 13, 2016 after his stepfather died.  Since that time, patient reports experiencing significant stress and anxiety as he reports mother is constantly asking him to do things.  Per his report, she could do some of these things for self.  He states mother wants him to stay with her all the time.  She calls him excessively anytime he is away from home.  Patient states mother has these spells when she says no one cares about her and she is going to kill self.  She has had a couple of psychiatric hospitalizations and EMS was contacted earlier  this week.  However, mother refused to go to hospital.  Mother also has excessive alcohol use per his report.  Patient expresses frustration  and worries about his own health due to several health conditions.    Suicidal/Homcidal: Nowithout intent/plan  Therapist Response: Reviewed symptoms, administered PHQ 2 and 9 and GAD-7, discussed results, gathered more information from patient , began to explore possible goals for treatment, assisted patient began to examine his pattern of interaction with his mother, assisted patient identify strategies he has used in the past to try to cope with stress/anxiety, developed plan with patient to schedule consistent relaxation time for self by walking his dog in the mornings, also discussed the possibility of patient planning and implementing an overnight trip for self, began to provide psychoeducation on anxiety and the stress response, discussed rationale for and assisted patient practice deep breathing to trigger a relaxation response, developed plan with patient to practice deep breathing 5 to 10 minutes twice per day,  Plan: Return again in 2-4 weeks.  Diagnosis: MDD (major depressive disorder), recurrent episode, moderate (HCC)  GAD (generalized anxiety disorder)  Collaboration of Care: Medication Management AEB patient working with PCP and NP Shuvon Rankin for medication management  Patient/Guardian was advised Release of Information must be obtained prior to any record release in order to collaborate their care with an outside provider. Patient/Guardian was advised if they have not already done so to contact the registration department to sign all necessary forms in order for us  to release information regarding their care.   Consent: Patient/Guardian gives verbal consent for treatment and assignment  of benefits for services provided during this visit. Patient/Guardian expressed understanding and agreed to proceed.   Winton FORBES Rubinstein, LCSW 07/28/2023

## 2023-07-30 ENCOUNTER — Other Ambulatory Visit: Payer: Self-pay | Admitting: Cardiology

## 2023-08-03 ENCOUNTER — Telehealth (HOSPITAL_COMMUNITY): Payer: Self-pay | Admitting: *Deleted

## 2023-08-03 NOTE — Telephone Encounter (Signed)
 Per Jesus Mikes with [facility] calls called and stated patient was seen today and his depression questionnaire was stable but it was at [facility]). Per Jesus, per their policy they have to inform provider.

## 2023-08-06 ENCOUNTER — Other Ambulatory Visit: Payer: Self-pay | Admitting: Cardiology

## 2023-08-08 ENCOUNTER — Other Ambulatory Visit: Payer: Self-pay | Admitting: Urology

## 2023-08-09 ENCOUNTER — Encounter: Payer: Self-pay | Admitting: Nurse Practitioner

## 2023-08-09 ENCOUNTER — Ambulatory Visit: Attending: Nurse Practitioner | Admitting: Nurse Practitioner

## 2023-08-09 VITALS — BP 130/90 | HR 82 | Ht 70.0 in | Wt 257.0 lb

## 2023-08-09 DIAGNOSIS — I5022 Chronic systolic (congestive) heart failure: Secondary | ICD-10-CM | POA: Diagnosis not present

## 2023-08-09 DIAGNOSIS — I2510 Atherosclerotic heart disease of native coronary artery without angina pectoris: Secondary | ICD-10-CM

## 2023-08-09 DIAGNOSIS — R0609 Other forms of dyspnea: Secondary | ICD-10-CM

## 2023-08-09 DIAGNOSIS — I10 Essential (primary) hypertension: Secondary | ICD-10-CM

## 2023-08-09 DIAGNOSIS — R5383 Other fatigue: Secondary | ICD-10-CM

## 2023-08-09 DIAGNOSIS — E785 Hyperlipidemia, unspecified: Secondary | ICD-10-CM

## 2023-08-09 DIAGNOSIS — G4733 Obstructive sleep apnea (adult) (pediatric): Secondary | ICD-10-CM

## 2023-08-09 NOTE — Patient Instructions (Addendum)
 Medication Instructions:  Your physician recommends that you continue on your current medications as directed. Please refer to the Current Medication list given to you today.  Labwork: None   Testing/Procedures: None   Follow-Up: Your physician recommends that you schedule a follow-up appointment in:  3-4 months with Almarie Crate, NP  Please schedule appointment with Heart Failure clinic   Any Other Special Instructions Will Be Listed Below (If Applicable).  If you need a refill on your cardiac medications before your next appointment, please call your pharmacy.

## 2023-08-09 NOTE — Progress Notes (Unsigned)
 Cardiology Office Note:  .   Date: 08/09/2023 ID:  [name] [name] [name] [name]., DOB 1965-05-05, MRN [id number] PCP: Renato Dorothey HERO, NP  [facility] Providers Cardiologist:  Alvan Carrier, MD    History of Present Illness: .   [name] [name]. is a 58 y.o. male with a PMH of chronic systolic CHF, ischemic cardiomyopathy, CAD, hypertension, OSA (followed by Dr. Jude), hyperlipidemia and history of prior LV thrombus, who presents today for 6-month follow-up. Followed by HF clinic.   Prior CV history of anterior STEMI in 2010, received DES to LAD.  LV EF at that time, 35%.  In 2012, Myoview  revealed anterior scar with mild ischemia.  EF in 2013 was 45 to 50%.  GXT at that time was negative for ischemia.  In 2018, echocardiogram revealed EF reduced to 30 to 35%, diffuse hypokinesis with akinesis of anterior and anteroseptal walls, evidence of LV thrombus, was referred for cardiac cath.  See full report below of cardiac catheterization in August 2018, he received drug-eluting stent to D2.  Repeat echo in November 2018 showed EF 35% with apical thrombus.  Repeat echo in February 2019 revealed EF 35 to 40%, restrictive, no clear thrombus.  NST in 2020 showed large anterior/anteroseptal scar, EF 22%. TTE in 2022 EF 25%.  He has been evaluated by EP due to reduced EF and discussed ICD, patient has requested to give this some thought. Most recent Echo 02/2022 revealed EF 25 to 30%, considering barostim.  Last seen by Dr. Carrier Alvan on April 22, 2022.  Was euvolemic on exam.  Patient noted chronic stable shortness of breath on exertion, NYHA class III symptoms.  Was considering barostim.  Patient stated he had not committed to ICD.  I last saw patient on November 30, 2022.  He told me he underwent implantation of hypoglossal nerve stimulator for OSA on November 18, 2022.  Was tolerating this well.  Swelling was subsiding and followed by Dr. Carlie.  Denied any recurrence of chest pain since I last saw him in  the office.  Was overall doing well.  02/16/2023 - Today presents for follow-up.  States overall he is doing well and admits to intermittent episodes of chest pain and shortness of breath that is majorly related to stress he says.  Also can occur when walking for a long period of time.  Says this has been chronic and stable for a while.  He admits to significant stress related to caring for his mom, denies any red flag signs/symptoms. Currently lives with his mom and provides care for her. Denies any palpitations, syncope, presyncope, dizziness, orthopnea, PND, swelling or significant weight changes, acute bleeding, or claudication.  04/06/2023 -presents today for follow-up.  Admits to some fatigue, not sure if this is related to his sleep apnea or his heart failure.  He admits to dyspnea on exertion, believes he is dehydrated.  He says he is starting to work on increasing his fluid intake.  He is wanting to know about a stress test that was previously recommended from last office visit with heart failure clinic. Denies any chest pain, palpitations, syncope, presyncope, dizziness, orthopnea, PND, swelling or significant weight changes, acute bleeding, or claudication.  08/09/2023 -he is here for follow-up in our office.  Chief concern is feeling tired and gets out of breath every so often, attributes the shortness of breath to the heat.  He has not followed up with the heart failure clinic.  Per last progress note from heart  failure MD, Dr. [name], he was due to follow-up in May of this year. Denies any chest pain, palpitations, syncope, presyncope, dizziness, orthopnea, PND, swelling or significant weight changes, acute bleeding, or claudication.   ROS: Negative.  See HPI.  Studies Reviewed: SABRA    EKG:  EKG Interpretation Date/Time:  Monday August 09 2023 09:27:26 EDT Ventricular Rate:  82 PR Interval:  160 QRS Duration:  94 QT Interval:  374 QTC Calculation: 436 R Axis:   41  Text  Interpretation: Normal sinus rhythm Septal infarct (cited on or before 06-Jul-2011) When compared with ECG of 27-Oct-2022 09:17, Serial changes of Septal infarct Present Confirmed by Miriam Norris [phone]) on 08/09/2023 9:30:33 AM    Echo 01/2023:  1. Left ventricular ejection fraction, by estimation, is 25 to 30%. The  left ventricle has severely decreased function. The left ventricle  demonstrates regional wall motion abnormalities (see scoring  diagram/findings for description). The left  ventricular internal cavity size was moderately to severely dilated. Left  ventricular diastolic parameters are consistent with Grade II diastolic  dysfunction (pseudonormalization).   2. No formed LV mural thrombus by Definity  contrast.   3. Right ventricular systolic function is normal. The right ventricular  size is normal. Tricuspid regurgitation signal is inadequate for assessing  PA pressure.   4. The mitral valve is degenerative. Mild mitral valve regurgitation.   5. The aortic valve is tricuspid. Aortic valve regurgitation is not  visualized. No aortic stenosis is present. Aortic valve mean gradient  measures 4.0 mmHg.   6. The inferior vena cava is normal in size with greater than 50%  respiratory variability, suggesting right atrial pressure of 3 mmHg.   Comparison(s): Prior images reviewed side by side. LVEF stable in range of  25-30%.    Echo 02/2022:  1. Left ventricular ejection fraction, by estimation, is 25 to 30%. The  left ventricle has severely decreased function. The left ventricle  demonstrates global hypokinesis with mid to apical septal akinesis and  akinesis of the true apex. The left  ventricular internal cavity size was mildly dilated. Left ventricular  diastolic parameters are consistent with Grade I diastolic dysfunction  (impaired relaxation).   2. Right ventricular systolic function is mildly reduced. The right  ventricular size is normal. Tricuspid regurgitation  signal is inadequate  for assessing PA pressure.   3. The mitral valve is normal in structure. No evidence of mitral valve  regurgitation. No evidence of mitral stenosis.   4. The aortic valve is tricuspid. There is mild calcification of the  aortic valve. Aortic valve regurgitation is not visualized. No aortic  stenosis is present.   5. Aortic dilatation noted. There is mild dilatation of the aortic root,  measuring 38 mm.   6. The inferior vena cava is normal in size with greater than 50%  respiratory variability, suggesting right atrial pressure of 3 mmHg.  Carotid duplex 02/2022: Summary:  Right Carotid: The extracranial vessels were near-normal with only minimal wall thickening or plaque.   Left Carotid: The extracranial vessels were near-normal with only minimal wall thickening or plaque.   Vertebrals:  Bilateral vertebral arteries demonstrate antegrade flow.  Subclavians: Normal flow hemodynamics were seen in bilateral subclavian arteries.  Lexiscan  10/2021:       Findings are consistent with large prior anteroseptal  myocardial infarction. There is no current ischemia.  The study is high risk based on decreased LVEF alone, there is no current myocardium at jeopardy. Consider correlating LVEF with echo.  No ST deviation was noted.   LV perfusion is abnormal.   Left ventricular function is abnormal. Nuclear stress EF: 24 %. End diastolic cavity size is severely enlarged.     Right/left heart cath 08/2016: Mid RCA-2 lesion, 30 %stenosed. Mid RCA-1 lesion, 30 %stenosed. Ost Ramus to Ramus lesion, 20 %stenosed. Ost LAD to Prox LAD lesion, 10 %stenosed. Prox LAD to Mid LAD lesion, 60 %stenosed. A STENT SYNERGY DES 2.25X12 drug eluting stent was successfully placed. 2nd Diag lesion, 99 %stenosed. Post intervention, there is a 0% residual stenosis.   1. Patent stent proximal LAD 2. Moderate stenosis mid LAD. FFR was 0.84 suggesting the stenosis was not flow limiting.  3. The  Diagonal branch is a moderate caliber vessel with a 99% stenosis.  4. Successful PTCA/DES x 1 Diagonal branch 5. Mild disease RCA   Recommendations: Will continue ASA and Plavix  for now. He will need to be started on coumadin  tomorrow before discharge for his LV thrombus. He can stop ASA when his INR is therapeutic on coumadin  since he was not an ACS. I would plan long term coumadin  for LV thrombus and Plavix  for at least one year given placement of DES. Continue statin and beta blocker.      Physical Exam:   VS:  BP (!) 130/90 (BP Location: Left Arm)   Pulse 82   Ht 5' 10 (1.778 m)   Wt 257 lb (116.6 kg)   SpO2 94%   BMI 36.88 kg/m    Wt Readings from Last 3 Encounters:  08/09/23 257 lb (116.6 kg)  06/07/23 245 lb (111.1 kg)  05/27/23 245 lb (111.1 kg)    GEN: Obese, 58 year old male in no acute distress NECK: No JVD; No carotid bruits CARDIAC: S1/S2, RRR, no murmurs, rubs, gallops RESPIRATORY:  Clear to auscultation without rales, wheezing or rhonchi ABDOMEN: Soft, non-tender, non-distended EXTREMITIES:  No edema; No deformity   ASSESSMENT AND PLAN: .    Chronic systolic CHF, DOE Stage C, NYHA class II-III symptoms.  EF 01/2023 25 to 30%. DOE pt attributes to summer heat. Difficult to ascertain volume status, but denies any symptoms of volume overload. Appears euvolemic and well compensated on exam. Continue current medication regimen.  Low sodium diet, fluid restriction <2L, and daily weights encouraged. Educated to contact our office for weight gain of 2 lbs overnight or 5 lbs in one week.  Continue follow-up with heart failure clinic - will assist him to get f/u arranged with HF clinic.   2.  CAD Denies any chest pain.  No indication for ischemic evaluation.  NST in 2023 was negative for current ischemia.   Continue aspirin , Lipitor , carvedilol , Entresto , and nitroglycerin  as needed.  Care and ED precautions discussed.    3. HTN Blood stable borderline elevated today, BP well  controlled per his report. Discussed SBP goal is less than 130. No medication changes at this time.  Encourage adequate hydration. Discussed to monitor BP at home at least 2 hours after medications and sitting for 5-10 minutes. Heart healthy diet and regular cardiovascular exercise encouraged.  Discussed when to notify our office regarding his BP.   4. HLD LDL 62 last year.  He is currently at goal.  Continue atorvastatin .  Heart healthy diet and regular cardiovascular exercise encouraged.   5. OSA He is s/p implantation of hypoglossal nerve stimulator with ENT, Dr. Vaughan Ricker on 11/18/2022.  Continue to follow-up with Dr. Ricker, recommended to f/u with Dr. Jude.  6.  Fatigue  Etiology multifactorial.  Denies any chest pain.  Recommended adequate fluid intake as mentioned above.  Continue follow-up with PCP for further evaluation.  Dispo: Care and ED precautions discussed.  Follow-up with me/APP in 3-4 months or sooner if anything changes.    Signed, Almarie Crate, NP

## 2023-08-20 ENCOUNTER — Encounter (HOSPITAL_COMMUNITY): Payer: Self-pay | Admitting: Cardiology

## 2023-08-20 ENCOUNTER — Ambulatory Visit (HOSPITAL_COMMUNITY)
Admission: RE | Admit: 2023-08-20 | Discharge: 2023-08-20 | Disposition: A | Discharge status: Home / Self Care | Source: Ambulatory Visit | Attending: Cardiology | Admitting: Cardiology

## 2023-08-20 VITALS — BP 110/82 | HR 85 | Ht 70.0 in | Wt 251.4 lb

## 2023-08-20 DIAGNOSIS — E119 Type 2 diabetes mellitus without complications: Secondary | ICD-10-CM | POA: Insufficient documentation

## 2023-08-20 DIAGNOSIS — Z955 Presence of coronary angioplasty implant and graft: Secondary | ICD-10-CM | POA: Insufficient documentation

## 2023-08-20 DIAGNOSIS — I110 Hypertensive heart disease with heart failure: Secondary | ICD-10-CM | POA: Diagnosis not present

## 2023-08-20 DIAGNOSIS — I2510 Atherosclerotic heart disease of native coronary artery without angina pectoris: Secondary | ICD-10-CM | POA: Diagnosis not present

## 2023-08-20 DIAGNOSIS — I5022 Chronic systolic (congestive) heart failure: Secondary | ICD-10-CM | POA: Diagnosis present

## 2023-08-20 DIAGNOSIS — G4733 Obstructive sleep apnea (adult) (pediatric): Secondary | ICD-10-CM | POA: Insufficient documentation

## 2023-08-20 DIAGNOSIS — Z7984 Long term (current) use of oral hypoglycemic drugs: Secondary | ICD-10-CM | POA: Diagnosis not present

## 2023-08-20 DIAGNOSIS — Z79899 Other long term (current) drug therapy: Secondary | ICD-10-CM | POA: Insufficient documentation

## 2023-08-20 DIAGNOSIS — I252 Old myocardial infarction: Secondary | ICD-10-CM | POA: Diagnosis not present

## 2023-08-20 DIAGNOSIS — E669 Obesity, unspecified: Secondary | ICD-10-CM | POA: Diagnosis not present

## 2023-08-20 DIAGNOSIS — Z7985 Long-term (current) use of injectable non-insulin antidiabetic drugs: Secondary | ICD-10-CM | POA: Diagnosis not present

## 2023-08-20 NOTE — Patient Instructions (Signed)
 [facility] [place]   You are scheduled for a Cardiopulmonary Exercise (CPX) Test as [facility] on: Date:      Time:   Expect to be in the lab for 2 hours. Please plan to arrive 30 minutes prior to your appointment. You may be asked to reschedule your test if you arrive 20 minutes or more after your scheduled appointment time.  Main Campus address: [address] [place], [place] [address] You may arrive to the Main Entrance A or Entrance C (free valet parking is available at both). -Main Entrance A (on [address]) :proceed to admitting for check in -Entrance C (on [facility]): proceed to [employer] parking or under hospital deck parking using this code _________  Check In: [facility] waiting room (1st floor)   General Instructions for the day of the test (Please follow all instructions from your physician): Refrain from ingesting a heavy meal, alcohol, or caffeine or using tobacco products within 2 hours of the test (DO NOT FAST for mare than 8 hours). You may have all other non-alcoholic, non -caffeinated beverage,a light snack (crackers,a piece of fruit, carrot sticks, toast bagel,etc) up to your appointment. Avoid significant exertion or exercise within 24 hours of your test. Be prepared to exercise and sweat. Your clothing should permit freedom of movement and include walking or running shoes. Women bring loose fitting short sleeved blouse.  This evaluation may be fatiguing and you may wish ti have someone accompany you to the assessment to drive you home afterward. Bring a list of your medications with you, including dosage and frequency you take the medications (  I.e.,once per day, twice per day, etc). Take all medications as prescribed, unless noted below or instructed to do so by your physician.  Please do not take the following medications prior to your CPX:  _________________________________________________  _________________________________________________  Brief  description of the test: A brief lung test will be performed. This will involve you taking deep breaths and blowing hard and fast through your mouth. During these , a clip will be on your nose and you will be breathing through a breathing device.   For the exercise portion of the test you will be walking on a treadmill, or riding a stationary bike, to your maximal effor or until symptoms such as chest pain, shortness of breath, leg pain or dizziness limit your exercise. You will be breathing in and out of a breathing device through your mouth (a clip will be on your nose again). Your heart rate, ECG, blood pressure, oxygen  saturations, breathing rate and depth, amount of oxygen  you consume and amount of carbon dioxide you produce will be measured and monitored throughout the exercise test.  If you need to cancel or reschedule your appointment please call [phone] If you have further questions please call your physician or Damien Nunnery at [phone]  Your physician recommends that you schedule a follow-up appointment in: 3 months ( November) ** PLEASE CALL THE OFFICE IN [place] TO ARRANGE YOUR FOLLOW UP APPOINTMENT.**  If you have any questions or concerns before your next appointment please send us  a message through [place] or call our office at [phone].    TO LEAVE A MESSAGE FOR THE NURSE SELECT OPTION 2, PLEASE LEAVE A MESSAGE INCLUDING: YOUR NAME DATE OF BIRTH CALL BACK NUMBER REASON FOR CALL**this is important as we prioritize the call backs  YOU WILL RECEIVE A CALL BACK THE SAME DAY AS LONG AS YOU CALL BEFORE 4:00 PM  At the [facility], you and your health needs are our priority. As part of our continuing mission to provide you with exceptional heart care, we have created designated Provider Care Teams. These Care Teams include your primary Cardiologist (physician) and Advanced Practice Providers (APPs- Physician Assistants and Nurse Practitioners) who all  work together to provide you with the care you need, when you need it.   You may see any of the following providers on your designated Care Team at your next follow up: Dr Toribio Fuel Dr Ezra Shuck Dr. Ria Commander Dr. Morene Brownie Amy Lenetta, NP Caffie Shed, [place] [facility] [place], [place] Beckey Begley, NP [place] Lee, NP Ellouise Class, NP Tinnie Redman, PharmD Jaun Bash, PharmD   Please be sure to bring in all your medications bottles to every appointment.    Thank you for choosing [facility]

## 2023-08-25 NOTE — Progress Notes (Signed)
 ADVANCED HEART FAILURE FOLLOW UP CLINIC NOTE  Referring Physician: Renato Dorothey HERO, NP  Primary Care: Renato Dorothey HERO, NP Primary Cardiologist:  HPI: [name] [name]. is a 58 y.o. 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      Prior CV history of anterior STEMI in 2010, received DES to LAD.  LV EF at that time, 35%.   Cardiac catheterization in August 2018, he received drug-eluting stent to D2.  Repeat echo in November 2018 showed EF 35% with apical thrombus.  Repeat echo in February 2019 revealed EF 35 to 40%, restrictive, no clear thrombus.  TTE in 2022 EF 25%.  He has been evaluated by EP due to reduced EF and discussed ICD, patient has requested to give this some thought. Most recent Echo 02/2022 revealed EF 25 to 30%, considering barostim but now planning on moving forward with [facility] device.   Underwent hypoglossal nerve stimulator placement 10/2022.      SUBJECTIVE:  Reports that he is sleeping better, but is seen overall minimal improvement in his symptoms of fatigue and shortness of breath with exertion.  He is trying to be more active, but finds it difficult.  He denies any worsening swelling, orthopnea, PND, but for the past few months has remained fatigued.  We discussed potential diagnostic interventions including right heart catheterization and cardiopulmonary exercise testing.  Briefly discussed advanced therapies  PMH, current medications, allergies, social history, and family history reviewed in epic.  PHYSICAL EXAM: Vitals:   08/20/23 1019  BP: 110/82  Pulse: 85  SpO2: 96%   GENERAL: NAD, well appearing PULM:  Normal work of breathing, CTAB CARDIAC:  JVP: flat         Normal rate with regular rhythm. No murmurs, rubs or gallops.  No edema. Warm and well perfused extremities. ABDOMEN: Soft, non-tender,  non-distended. NEUROLOGIC: Patient is oriented x3 with no focal or lateralizing neurologic deficits.     DATA REVIEW  ECG: Anteroseptal infarct pattern, normal sinus rhythm, normal QRS   ECHO: 02/2022: EF 25-30%, mid to apical septal and anterior akinesis, mildly dilated LV, mildly reduced RV systolic function.   CATH: 08/2016: Patent LAD stent, moderate mid LAD stenosis FFR negative, D2 stenosis.   Lexiscan  2023: Large prior anteroseptal myocardial infarction. No evidence of reversible ishcemia.   Heart failure review: - Classification: Heart failure with reduced EF - Etiology: Ischemic - NYHA Class: IIIb  - Volume status: Euvolemic - ACEi/ARB/ARNI: Maximally tolerated dose - Aldosterone antagonist: Maximally tolerated dose - Beta-blocker: Maximally tolerated dose - Digoxin: Plan to start at a subsequent visit - Hydralazine/Nitrates: Not indicated - SGLT2i: Maximally tolerated dose - GLP-1: Already on GLP-1 - Advanced therapies: Not needed at this time - ICD: Already in place  ASSESSMENT & PLAN:  Chronic systolic HF: Patient with Stage C, NYHA Class III symptoms, but does appear to be sliding. He has been on excellent medical therapy. Main symptom continues to be fatigue, would have been a good barostim candidate but has inspire device in place now. No imporvement with vericiguat .  - Continue spironolactone  25 mg daily -Stop vericiguat  - Continue carvedilol  25 mg twice a day, Entresto  97/103 mg twice a day, Farxiga 10 mg daily -Cardiopulmonary exercise testing ordered -Pending this will consider further workup for advanced therapies.  CAD: Known LAD disease, atypical chest pain that appears to be  associated with stress.  SPECT without evidence of ischemia. -Continue beta-blocker, aspirin , statin   HTN: Well-controlled on current therapy as above   OSA: Unable to tolerate CPAP, s/p hypoglossal nerve stimulator placement.  Has had some improvement in his sleep  habits  Obesity:   - On ozempic , minimal imporvement  Diabetes:  - Multiple oral agents, last A1c 7.0 - Follow with PCP  Follow up in 3 months  Morene Brownie, MD Advanced Heart Failure Mechanical Circulatory Support 08/25/23

## 2023-08-31 ENCOUNTER — Other Ambulatory Visit (INDEPENDENT_AMBULATORY_CARE_PROVIDER_SITE_OTHER): Payer: Self-pay | Admitting: Otolaryngology

## 2023-09-01 ENCOUNTER — Ambulatory Visit (HOSPITAL_COMMUNITY): Admitting: Psychiatry

## 2023-09-01 ENCOUNTER — Ambulatory Visit (HOSPITAL_COMMUNITY): Attending: Cardiology

## 2023-09-01 DIAGNOSIS — I5022 Chronic systolic (congestive) heart failure: Secondary | ICD-10-CM | POA: Insufficient documentation

## 2023-09-03 DIAGNOSIS — I5022 Chronic systolic (congestive) heart failure: Secondary | ICD-10-CM | POA: Diagnosis not present

## 2023-09-10 ENCOUNTER — Other Ambulatory Visit: Payer: Self-pay | Admitting: Urology

## 2023-09-15 ENCOUNTER — Ambulatory Visit (HOSPITAL_COMMUNITY): Admitting: Psychiatry

## 2023-09-23 ENCOUNTER — Telehealth (HOSPITAL_COMMUNITY): Payer: Self-pay | Admitting: Cardiology

## 2023-09-23 NOTE — Telephone Encounter (Signed)
 Patient left VM on triage line requesting CPX results

## 2023-09-29 ENCOUNTER — Ambulatory Visit (HOSPITAL_COMMUNITY): Admitting: Psychiatry

## 2023-10-08 ENCOUNTER — Other Ambulatory Visit: Payer: Self-pay | Admitting: Urology

## 2023-10-13 ENCOUNTER — Encounter (HOSPITAL_COMMUNITY): Payer: Self-pay | Admitting: Psychiatry

## 2023-10-13 ENCOUNTER — Ambulatory Visit (HOSPITAL_COMMUNITY): Admitting: Psychiatry

## 2023-11-04 ENCOUNTER — Other Ambulatory Visit: Payer: Self-pay | Admitting: Cardiology

## 2023-11-09 ENCOUNTER — Other Ambulatory Visit: Payer: Self-pay | Admitting: Urology

## 2023-11-18 ENCOUNTER — Ambulatory Visit: Admitting: Nurse Practitioner

## 2023-11-23 ENCOUNTER — Ambulatory Visit (INDEPENDENT_AMBULATORY_CARE_PROVIDER_SITE_OTHER): Admitting: Psychiatry

## 2023-11-23 ENCOUNTER — Encounter (HOSPITAL_COMMUNITY): Payer: Self-pay

## 2023-11-23 DIAGNOSIS — F411 Generalized anxiety disorder: Secondary | ICD-10-CM | POA: Diagnosis not present

## 2023-11-23 DIAGNOSIS — F331 Major depressive disorder, recurrent, moderate: Secondary | ICD-10-CM

## 2023-11-23 NOTE — Progress Notes (Signed)
 IN-PERSON  THERAPIST PROGRESS NOTE  Session Time: Tuesday 11/23/2023 9:10 AM - 9:51 AM   Participation Level: Active  Behavioral Response: CasualAlertAnxious and Depressed  Type of Therapy: Individual Therapy  Treatment Goals addressed: Pt will improve coping skills to manage anxiety AEB reducing episodes of lashing out from daily to 1 x per week for 30 days per self report  : Learn and implement relaxation techniques, practice a relaxation technique daily   ProgressTowards Goals:  Initial   Interventions: CBT and Supportive  Summary: [name] [name]. is a 58 y.o. male who is referred for services by NP Shuvan Rankin. Pt denies any psychiatric hospitalizations and any previous involvement in outpatient therapy.  Pt states Everybody wants me to take care of my mother, she is putting a lot of stress and anxiety on me, my doctors are telling me that I don't need this stress because of my heart condition, I am not getting any rest, she needs around the clock attention, she uses a walker, little to no help from othe family members.  Patient's current symptoms include difficulty concentrating, fatigue, irritability, thoughts and feelings of worthlessness/hopelessness, worrying, and muscle tension.  Patient last was seen about 4 months ago.   He continues to experience symptoms of depression and anxiety as reflected in the PHQ 2 & 9 and the GAD-7.  Patient reports multiple stressors including financial issues regarding repairs on the home where he and his mother are residing.  Patient reports no longer being able to afford the repairs.  He expresses frustration as he has tried to convince his mother to move into an assisted living environment but she refuses.  He has also tried to enlist support from his aunt who is his mother's power of attorney but reports this has not been successful.  He also expresses frustration with mother regarding their relationship as she continues to constantly asked him  to do things.  Patient reports irritability and lashing out.  He has been trying to practice deep breathing in the morning as well as take his dog for walk in the park 2-3 times per week.  He reports this has been helpful in having some time for self.  He reports additional stress regarding transportation issues as his car needs repairs.  He dresses appropriate sadness about the recent death of a long time friend but says he is coping with this well.   Suicidal/Homcidal: Nowithout intent/plan  Therapist Response: Reviewed symptoms, administered PHQ 2 and 9 and GAD-7, discussed results, gathered more information from patient , discussed stressors, facilitated expression of thoughts and feelings, validated feelings developed treatment plan, sent to signature page and copy of treatment plan to patient via MyChart, discussed rationale for and developed plan with patient to begin keeping a log of lashing out and bring to next session, praised and reinforced patient's efforts to deep breathing and developed plan with patient to continue daily, also encouraged patient to continue scheduling time for walking and having time for self Plan: Return again in 2-4 weeks.  Diagnosis: MDD (major depressive disorder), recurrent episode, moderate (HCC)  GAD (generalized anxiety disorder)  Collaboration of Care: Medication Management AEB patient working with PCP and NP Shuvon Rankin for medication management  Patient/Guardian was advised Release of Information must be obtained prior to any record release in order to collaborate their care with an outside provider. Patient/Guardian was advised if they have not already done so to contact the registration department to sign all necessary forms in order  for us  to release information regarding their care.   Consent: Patient/Guardian gives verbal consent for treatment and assignment of benefits for services provided during this visit. Patient/Guardian expressed understanding  and agreed to proceed.   [name] FORBES Rubinstein, LCSW 11/23/2023

## 2023-11-26 ENCOUNTER — Ambulatory Visit: Attending: Nurse Practitioner | Admitting: Nurse Practitioner

## 2023-11-26 ENCOUNTER — Encounter: Payer: Self-pay | Admitting: Nurse Practitioner

## 2023-11-26 VITALS — BP 120/76 | HR 74 | Ht 70.0 in | Wt 255.4 lb

## 2023-11-26 DIAGNOSIS — I10 Essential (primary) hypertension: Secondary | ICD-10-CM

## 2023-11-26 DIAGNOSIS — I2510 Atherosclerotic heart disease of native coronary artery without angina pectoris: Secondary | ICD-10-CM

## 2023-11-26 DIAGNOSIS — R0609 Other forms of dyspnea: Secondary | ICD-10-CM

## 2023-11-26 DIAGNOSIS — E785 Hyperlipidemia, unspecified: Secondary | ICD-10-CM

## 2023-11-26 DIAGNOSIS — G4733 Obstructive sleep apnea (adult) (pediatric): Secondary | ICD-10-CM

## 2023-11-26 DIAGNOSIS — I5022 Chronic systolic (congestive) heart failure: Secondary | ICD-10-CM

## 2023-11-26 DIAGNOSIS — E669 Obesity, unspecified: Secondary | ICD-10-CM

## 2023-11-26 NOTE — Patient Instructions (Signed)
 Medication Instructions:  Your physician recommends that you continue on your current medications as directed. Please refer to the Current Medication list given to you today.  *If you need a refill on your cardiac medications before your next appointment, please call your pharmacy*  Lab Work: None If you have labs (blood work) drawn today and your tests are completely normal, you will receive your results only by: MyChart Message (if you have MyChart) OR A paper copy in the mail If you have any lab test that is abnormal or we need to change your treatment, we will call you to review the results.  Testing/Procedures: None  Follow-Up: At [facility], you and your health needs are our priority.  As part of our continuing mission to provide you with exceptional heart care, our providers are all part of one team.  This team includes your primary Cardiologist (physician) and Advanced Practice Providers or APPs (Physician Assistants and Nurse Practitioners) who all work together to provide you with the care you need, when you need it.  Your next appointment:   6 month(s)  Provider:   You may see Alvan Carrier, MD or the following Advanced Practice Provider on your designated Care Team:   Almarie Crate, NP    We recommend signing up for the patient portal called MyChart.  Sign up information is provided on this After Visit Summary.  MyChart is used to connect with patients for Virtual Visits (Telemedicine).  Patients are able to view lab/test results, encounter notes, upcoming appointments, etc.  Non-urgent messages can be sent to your provider as well.   To learn more about what you can do with MyChart, go to [email or web address].   Other Instructions  Heart Failure Action Plan A heart failure action plan helps you know what to do when you have symptoms of heart failure. Your action plan is a color-coded plan that lists the symptoms to watch for and indicates what actions  to take. If you have symptoms in the green zone, you're doing well. If you have symptoms in the yellow zone, you're having problems. If you have symptoms in the red zone, you need medical care right away. Follow the plan that was created by you and your health care provider. Review your plan each time you visit your provider. Green zone These signs mean you're doing well and can continue what you're doing: You don't have new or worsening shortness of breath. You have very little swelling or no new swelling. Your weight is stable (no gain or loss). You have a normal activity level. You don't have chest pain or any other new symptoms. Yellow zone These signs and symptoms mean your condition may be getting worse and you should make some changes: You have trouble breathing when you're active. You have swelling in your feet or legs or have discomfort in your belly. You gain 2-3 lb (0.9-1.4 kg) in 24 hours, or 5 lb (2.3 kg) in a week. This amount may be more or less depending on your condition. You get tired easily. You have trouble sleeping. You have a dry cough. If you have any of these symptoms: Contact your provider within the next day. Your provider may adjust your medicines. Red zone These signs and symptoms mean you should get medical help right away: You have trouble breathing when resting or cannot lie flat and you need to raise your head to help you breathe. You have a dry cough that's getting worse. You have swelling or pain  in your feet or legs or discomfort in your belly that's getting worse. You suddenly gain more than 2-3 lb (0.9-1.4 kg) in 24 hours, or more than 5 lb (2.3 kg) in a week. This amount may be more or less depending on your condition. You have trouble staying awake or you feel confused. You don't have an appetite. You have worsening sadness or depression. These symptoms may be an emergency. Call 911 right away. Do not wait to see if the symptoms will go  away. Do not drive yourself to the hospital. Follow these instructions at home: Take medicines only as told. Eat a heart-healthy diet. Work with a dietitian to create an eating plan that's best for you. Weigh yourself each day. American Heart Association: heart.org This information is not intended to replace advice given to you by your health care provider. Make sure you discuss any questions you have with your health care provider. Document Revised: 08/20/2022 Document Reviewed: 08/20/2022 Elsevier Patient Education  2024 [employer].

## 2023-11-26 NOTE — Progress Notes (Signed)
 Cardiology Office Note:  .   Date: 11/26/2023 ID:  [name] [name]., DOB [birth year], MRN [id number] PCP: [name] Dorothey HERO, NP  [facility] Providers Cardiologist:  Alvan Carrier, MD    History of Present Illness: .   [name] [name]. is a 58 y.o. male with a PMH of chronic systolic CHF, ischemic cardiomyopathy, CAD, hypertension, OSA (followed by Dr. Jude), hyperlipidemia and history of prior LV thrombus, who presents today for 3-4 month follow-up. Followed by HF clinic.   Prior CV history of anterior STEMI in 2010, received DES to LAD.  LV EF at that time, 35%.  In 2012, Myoview  revealed anterior scar with mild ischemia.  EF in 2013 was 45 to 50%.  GXT at that time was negative for ischemia.  In 2018, echocardiogram revealed EF reduced to 30 to 35%, diffuse hypokinesis with akinesis of anterior and anteroseptal walls, evidence of LV thrombus, was referred for cardiac cath.  See full report below of cardiac catheterization in August 2018, he received drug-eluting stent to D2.  Repeat echo in November 2018 showed EF 35% with apical thrombus.  Repeat echo in February 2019 revealed EF 35 to 40%, restrictive, no clear thrombus.  NST in 2020 showed large anterior/anteroseptal scar, EF 22%. TTE in 2022 EF 25%.  He has been evaluated by EP due to reduced EF and discussed ICD, patient has requested to give this some thought. Most recent Echo 02/2022 revealed EF 25 to 30%, considering barostim.  Last seen by Dr. Carrier Alvan on April 22, 2022.  Was euvolemic on exam.  Patient noted chronic stable shortness of breath on exertion, NYHA class III symptoms.  Was considering barostim.  Patient stated he had not committed to ICD.  I last saw patient on November 30, 2022.  He told me he underwent implantation of hypoglossal nerve stimulator for OSA on November 18, 2022.  Was tolerating this well.  Swelling was subsiding and followed by Dr. Carlie.  Denied any recurrence of chest pain since I last saw him  in the office.  Was overall doing well.  02/16/2023 - Today presents for follow-up.  States overall he is doing well and admits to intermittent episodes of chest pain and shortness of breath that is majorly related to stress he says.  Also can occur when walking for a long period of time.  Says this has been chronic and stable for a while.  He admits to significant stress related to caring for his mom, denies any red flag signs/symptoms. Currently lives with his mom and provides care for her. Denies any palpitations, syncope, presyncope, dizziness, orthopnea, PND, swelling or significant weight changes, acute bleeding, or claudication.  04/06/2023 -presents today for follow-up.  Admits to some fatigue, not sure if this is related to his sleep apnea or his heart failure.  He admits to dyspnea on exertion, believes he is dehydrated.  He says he is starting to work on increasing his fluid intake.  He is wanting to know about a stress test that was previously recommended from last office visit with heart failure clinic. Denies any chest pain, palpitations, syncope, presyncope, dizziness, orthopnea, PND, swelling or significant weight changes, acute bleeding, or claudication.  08/09/2023 -he is here for follow-up in our office.  Chief concern is feeling tired and gets out of breath every so often, attributes the shortness of breath to the heat.  He has not followed up with the heart failure clinic.  Per last progress note from  heart failure MD, Dr. [name], he was due to follow-up in May of this year. Denies any chest pain, palpitations, syncope, presyncope, dizziness, orthopnea, PND, swelling or significant weight changes, acute bleeding, or claudication.   11/26/2023 - Here for follow-up. Doing well and denies any acute cardiac complaints or issues. Denies any chest pain, shortness of breath, palpitations, syncope, presyncope, dizziness, orthopnea, PND, swelling or significant weight changes, acute bleeding, or  claudication.  ROS: Negative.  See HPI.  Studies Reviewed: SABRA    EKG: EKG is not ordered today.   Cardiopulmonary exercise test 08/2023:  Attending: Mild to moderate functional limitation due to HF and obesity. The obesity limitation appears to predominate.  Echo 01/2023:  1. Left ventricular ejection fraction, by estimation, is 25 to 30%. The  left ventricle has severely decreased function. The left ventricle  demonstrates regional wall motion abnormalities (see scoring  diagram/findings for description). The left  ventricular internal cavity size was moderately to severely dilated. Left  ventricular diastolic parameters are consistent with Grade II diastolic  dysfunction (pseudonormalization).   2. No formed LV mural thrombus by Definity  contrast.   3. Right ventricular systolic function is normal. The right ventricular  size is normal. Tricuspid regurgitation signal is inadequate for assessing  PA pressure.   4. The mitral valve is degenerative. Mild mitral valve regurgitation.   5. The aortic valve is tricuspid. Aortic valve regurgitation is not  visualized. No aortic stenosis is present. Aortic valve mean gradient  measures 4.0 mmHg.   6. The inferior vena cava is normal in size with greater than 50%  respiratory variability, suggesting right atrial pressure of 3 mmHg.   Comparison(s): Prior images reviewed side by side. LVEF stable in range of  25-30%.    Echo 02/2022:  1. Left ventricular ejection fraction, by estimation, is 25 to 30%. The  left ventricle has severely decreased function. The left ventricle  demonstrates global hypokinesis with mid to apical septal akinesis and  akinesis of the true apex. The left  ventricular internal cavity size was mildly dilated. Left ventricular  diastolic parameters are consistent with Grade I diastolic dysfunction  (impaired relaxation).   2. Right ventricular systolic function is mildly reduced. The right  ventricular size is  normal. Tricuspid regurgitation signal is inadequate  for assessing PA pressure.   3. The mitral valve is normal in structure. No evidence of mitral valve  regurgitation. No evidence of mitral stenosis.   4. The aortic valve is tricuspid. There is mild calcification of the  aortic valve. Aortic valve regurgitation is not visualized. No aortic  stenosis is present.   5. Aortic dilatation noted. There is mild dilatation of the aortic root,  measuring 38 mm.   6. The inferior vena cava is normal in size with greater than 50%  respiratory variability, suggesting right atrial pressure of 3 mmHg.  Carotid duplex 02/2022: Summary:  Right Carotid: The extracranial vessels were near-normal with only minimal wall thickening or plaque.   Left Carotid: The extracranial vessels were near-normal with only minimal wall thickening or plaque.   Vertebrals:  Bilateral vertebral arteries demonstrate antegrade flow.  Subclavians: Normal flow hemodynamics were seen in bilateral subclavian arteries.  Lexiscan  10/2021:       Findings are consistent with large prior anteroseptal  myocardial infarction. There is no current ischemia.  The study is high risk based on decreased LVEF alone, there is no current myocardium at jeopardy. Consider correlating LVEF with echo.   No ST deviation  was noted.   LV perfusion is abnormal.   Left ventricular function is abnormal. Nuclear stress EF: 24 %. End diastolic cavity size is severely enlarged.     Right/left heart cath 08/2016: Mid RCA-2 lesion, 30 %stenosed. Mid RCA-1 lesion, 30 %stenosed. Ost Ramus to Ramus lesion, 20 %stenosed. Ost LAD to Prox LAD lesion, 10 %stenosed. Prox LAD to Mid LAD lesion, 60 %stenosed. A STENT SYNERGY DES 2.25X12 drug eluting stent was successfully placed. 2nd Diag lesion, 99 %stenosed. Post intervention, there is a 0% residual stenosis.   1. Patent stent proximal LAD 2. Moderate stenosis mid LAD. FFR was 0.84 suggesting the stenosis  was not flow limiting.  3. The Diagonal branch is a moderate caliber vessel with a 99% stenosis.  4. Successful PTCA/DES x 1 Diagonal branch 5. Mild disease RCA   Recommendations: Will continue ASA and Plavix  for now. He will need to be started on coumadin  tomorrow before discharge for his LV thrombus. He can stop ASA when his INR is therapeutic on coumadin  since he was not an ACS. I would plan long term coumadin  for LV thrombus and Plavix  for at least one year given placement of DES. Continue statin and beta blocker.      Physical Exam:   VS:  BP 120/76 (BP Location: Right Arm, Cuff Size: Large)   Pulse 74   Ht 5' 10 (1.778 m)   Wt 255 lb 6.4 oz (115.8 kg)   SpO2 95%   BMI 36.65 kg/m    Wt Readings from Last 3 Encounters:  11/26/23 255 lb 6.4 oz (115.8 kg)  08/20/23 251 lb 6.4 oz (114 kg)  08/09/23 257 lb (116.6 kg)    GEN: Obese, 58 year old male in no acute distress NECK: No JVD; No carotid bruits CARDIAC: S1/S2, RRR, no murmurs, rubs, gallops RESPIRATORY:  Clear to auscultation without rales, wheezing or rhonchi ABDOMEN: Soft, non-tender, non-distended EXTREMITIES:  No edema; No deformity   ASSESSMENT AND PLAN: .    Chronic systolic CHF, DOE Stage C, NYHA class II-III symptoms.  EF 01/2023 25 to 30%. DOE pt attributes to his weight that is also reflected in his recent cardiopulmonary exercise test. Difficult to ascertain volume status, but denies any symptoms of volume overload. Appears euvolemic and well compensated on exam. Continue current medication regimen.  Low sodium diet, fluid restriction <2L, and daily weights encouraged. Educated to contact our office for weight gain of 2 lbs overnight or 5 lbs in one week.  Continue follow-up with heart failure clinic/   2.  CAD Denies any chest pain.  No indication for ischemic evaluation.  NST in 2023 was negative for current ischemia.   Continue aspirin , Lipitor , carvedilol , Farxiga, Entresto , and nitroglycerin  as needed.  Care and  ED precautions discussed.    3. HTN Blood stable today, BP well controlled per his report. Discussed SBP goal is less than 130. No medication changes at this time.  Encourage adequate hydration. Discussed to monitor BP at home at least 2 hours after medications and sitting for 5-10 minutes. Heart healthy diet and regular cardiovascular exercise encouraged.  Discussed when to notify our office regarding his BP.   4. HLD LDL 70 03/2023.  He is currently at goal.  Continue atorvastatin .  Heart healthy diet and regular cardiovascular exercise encouraged.   5. OSA He is s/p implantation of hypoglossal nerve stimulator with ENT, Dr. Vaughan Ricker on 11/18/2022.  Continue to follow-up with Dr. Ricker, recommended to f/u with Dr. Jude.  6.  Obesity Weight loss via diet and exercise encouraged. Discussed the impact being overweight would have on cardiovascular risk.  I spent a total duration of 30 minutes reviewing prior notes, reviewing outside records including  labs, face-to-face counseling of medical condition, pathophysiology, evaluation, management, and documenting the findings in the note.   Dispo: Care and ED precautions discussed.  Follow-up with MD/APP in 6 months or sooner if anything changes.    Signed, Almarie Crate, NP

## 2023-11-28 ENCOUNTER — Other Ambulatory Visit (INDEPENDENT_AMBULATORY_CARE_PROVIDER_SITE_OTHER): Payer: Self-pay | Admitting: Otolaryngology

## 2023-12-07 ENCOUNTER — Ambulatory Visit (HOSPITAL_COMMUNITY): Admitting: Psychiatry

## 2023-12-08 ENCOUNTER — Ambulatory Visit (HOSPITAL_COMMUNITY): Admitting: Psychiatry

## 2023-12-09 ENCOUNTER — Other Ambulatory Visit: Payer: Self-pay | Admitting: Urology

## 2024-01-16 ENCOUNTER — Telehealth: Payer: Self-pay

## 2024-01-16 NOTE — Telephone Encounter (Signed)
 [name] [name]

## 2024-01-24 MED ORDER — TAMSULOSIN HCL 0.4 MG PO CAPS: 0.4000 mg | ORAL_CAPSULE | Freq: Every day | ORAL | 0 refills | Status: AC

## 2024-02-15 ENCOUNTER — Ambulatory Visit (INDEPENDENT_AMBULATORY_CARE_PROVIDER_SITE_OTHER): Admitting: Psychiatry

## 2024-02-15 DIAGNOSIS — Z0389 Encounter for observation for other suspected diseases and conditions ruled out: Secondary | ICD-10-CM

## 2024-02-15 NOTE — Progress Notes (Signed)
 Pt did not call or show for appt. Therapist waited 10 minutes past appt time.

## 2024-02-16 NOTE — Addendum Note (Signed)
 Addended by: DANTE WINTON BRAVO on: 02/16/2024 08:17 AM   Modules accepted: Level of Service

## 2024-02-18 ENCOUNTER — Other Ambulatory Visit: Payer: Self-pay | Admitting: Urology

## 2024-02-24 ENCOUNTER — Other Ambulatory Visit (INDEPENDENT_AMBULATORY_CARE_PROVIDER_SITE_OTHER): Payer: Self-pay | Admitting: Physician Assistant

## 2024-02-29 ENCOUNTER — Ambulatory Visit (HOSPITAL_COMMUNITY): Admitting: Psychiatry

## 2024-03-14 ENCOUNTER — Ambulatory Visit (HOSPITAL_COMMUNITY): Admitting: Psychiatry

## 2024-03-27 ENCOUNTER — Ambulatory Visit: Admitting: Urology

## 2024-05-25 ENCOUNTER — Ambulatory Visit: Admitting: Nurse Practitioner
